# Patient Record
Sex: Male | Born: 1950 | Race: White | Hispanic: No | Marital: Married | Smoking: Former smoker
Health system: Southern US, Community
[De-identification: ages and names within clinical notes are randomized; demographics above are authoritative.]

## PROBLEM LIST (undated history)

## (undated) DIAGNOSIS — E291 Testicular hypofunction: Secondary | ICD-10-CM

## (undated) DIAGNOSIS — I2510 Atherosclerotic heart disease of native coronary artery without angina pectoris: Secondary | ICD-10-CM

## (undated) DIAGNOSIS — G4733 Obstructive sleep apnea (adult) (pediatric): Secondary | ICD-10-CM

## (undated) DIAGNOSIS — F17210 Nicotine dependence, cigarettes, uncomplicated: Secondary | ICD-10-CM

## (undated) DIAGNOSIS — H60549 Acute eczematoid otitis externa, unspecified ear: Secondary | ICD-10-CM

## (undated) DIAGNOSIS — R079 Chest pain, unspecified: Secondary | ICD-10-CM

## (undated) DIAGNOSIS — R9431 Abnormal electrocardiogram [ECG] [EKG]: Secondary | ICD-10-CM

## (undated) DIAGNOSIS — I10 Essential (primary) hypertension: Secondary | ICD-10-CM

## (undated) DIAGNOSIS — G8929 Other chronic pain: Secondary | ICD-10-CM

## (undated) DIAGNOSIS — N400 Enlarged prostate without lower urinary tract symptoms: Secondary | ICD-10-CM

## (undated) DIAGNOSIS — Z9889 Other specified postprocedural states: Secondary | ICD-10-CM

## (undated) DIAGNOSIS — E119 Type 2 diabetes mellitus without complications: Secondary | ICD-10-CM

## (undated) DIAGNOSIS — E669 Obesity, unspecified: Secondary | ICD-10-CM

## (undated) DIAGNOSIS — Z122 Encounter for screening for malignant neoplasm of respiratory organs: Secondary | ICD-10-CM

## (undated) DIAGNOSIS — E785 Hyperlipidemia, unspecified: Secondary | ICD-10-CM

## (undated) DIAGNOSIS — C801 Malignant (primary) neoplasm, unspecified: Secondary | ICD-10-CM

## (undated) DIAGNOSIS — J449 Chronic obstructive pulmonary disease, unspecified: Secondary | ICD-10-CM

## (undated) DIAGNOSIS — M25512 Pain in left shoulder: Secondary | ICD-10-CM

## (undated) DIAGNOSIS — M51369 Other intervertebral disc degeneration, lumbar region without mention of lumbar back pain or lower extremity pain: Secondary | ICD-10-CM

## (undated) DIAGNOSIS — M5441 Lumbago with sciatica, right side: Secondary | ICD-10-CM

## (undated) DIAGNOSIS — M5414 Radiculopathy, thoracic region: Secondary | ICD-10-CM

## (undated) DIAGNOSIS — M5136 Other intervertebral disc degeneration, lumbar region: Secondary | ICD-10-CM

## (undated) DIAGNOSIS — M5417 Radiculopathy, lumbosacral region: Secondary | ICD-10-CM

## (undated) DIAGNOSIS — K219 Gastro-esophageal reflux disease without esophagitis: Secondary | ICD-10-CM

## (undated) DIAGNOSIS — Z6830 Body mass index (BMI) 30.0-30.9, adult: Secondary | ICD-10-CM

## (undated) DIAGNOSIS — L719 Rosacea, unspecified: Secondary | ICD-10-CM

## (undated) HISTORY — PX: SHOULDER ARTHROSCOPY: SHX128

## (undated) HISTORY — DX: Atherosclerotic heart disease of native coronary artery without angina pectoris: I25.10

## (undated) HISTORY — DX: Other intervertebral disc degeneration, lumbar region without mention of lumbar back pain or lower extremity pain: M51.369

## (undated) HISTORY — DX: Nicotine dependence, cigarettes, uncomplicated: F17.210

## (undated) HISTORY — DX: Hyperlipidemia, unspecified: E78.5

## (undated) HISTORY — PX: PROSTATE CRYOABLATION: SHX1032

## (undated) HISTORY — DX: Lumbago with sciatica, right side: M54.41

## (undated) HISTORY — DX: Radiculopathy, thoracic region: M54.14

## (undated) HISTORY — DX: Other intervertebral disc degeneration, lumbar region: M51.36

## (undated) HISTORY — DX: Pain in left shoulder: M25.512

## (undated) HISTORY — DX: Rosacea, unspecified: L71.9

## (undated) HISTORY — PX: CARDIAC CATHETERIZATION: SHX172

## (undated) HISTORY — PX: ARTHROSCOPIC REPAIR ACL: SUR80

## (undated) HISTORY — DX: Other chronic pain: G89.29

## (undated) HISTORY — DX: Body mass index (BMI) 30.0-30.9, adult: Z68.30

## (undated) HISTORY — DX: Type 2 diabetes mellitus without complications: E11.9

## (undated) HISTORY — DX: Radiculopathy, thoracic region: M54.17

## (undated) HISTORY — DX: Essential (primary) hypertension: I10

## (undated) HISTORY — DX: Obesity, unspecified: E66.9

## (undated) HISTORY — DX: Other specified postprocedural states: Z98.890

## (undated) HISTORY — PX: SPINE SURGERY: SHX786

---
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
## 2010-04-23 DIAGNOSIS — E291 Testicular hypofunction: Secondary | ICD-10-CM

## 2010-04-23 HISTORY — DX: Testicular hypofunction: E29.1

## 2010-08-31 DIAGNOSIS — I10 Essential (primary) hypertension: Secondary | ICD-10-CM | POA: Insufficient documentation

## 2010-08-31 HISTORY — DX: Essential (primary) hypertension: I10

## 2012-06-30 DIAGNOSIS — H60549 Acute eczematoid otitis externa, unspecified ear: Secondary | ICD-10-CM | POA: Insufficient documentation

## 2012-06-30 HISTORY — DX: Acute eczematoid otitis externa, unspecified ear: H60.549

## 2012-12-07 DIAGNOSIS — M545 Low back pain, unspecified: Secondary | ICD-10-CM | POA: Insufficient documentation

## 2013-10-07 DIAGNOSIS — G8929 Other chronic pain: Secondary | ICD-10-CM | POA: Insufficient documentation

## 2013-10-07 DIAGNOSIS — Z9989 Dependence on other enabling machines and devices: Secondary | ICD-10-CM

## 2013-10-07 DIAGNOSIS — G4733 Obstructive sleep apnea (adult) (pediatric): Secondary | ICD-10-CM | POA: Insufficient documentation

## 2013-10-07 HISTORY — DX: Other chronic pain: G89.29

## 2013-10-07 HISTORY — DX: Obstructive sleep apnea (adult) (pediatric): G47.33

## 2014-01-07 DIAGNOSIS — J449 Chronic obstructive pulmonary disease, unspecified: Secondary | ICD-10-CM | POA: Insufficient documentation

## 2014-01-07 HISTORY — DX: Chronic obstructive pulmonary disease, unspecified: J44.9

## 2014-07-12 DIAGNOSIS — E1142 Type 2 diabetes mellitus with diabetic polyneuropathy: Secondary | ICD-10-CM

## 2014-07-12 DIAGNOSIS — E119 Type 2 diabetes mellitus without complications: Secondary | ICD-10-CM | POA: Insufficient documentation

## 2014-07-12 HISTORY — DX: Type 2 diabetes mellitus without complications: E11.9

## 2014-07-12 HISTORY — DX: Type 2 diabetes mellitus with diabetic polyneuropathy: E11.42

## 2015-01-10 DIAGNOSIS — K219 Gastro-esophageal reflux disease without esophagitis: Secondary | ICD-10-CM

## 2015-01-10 DIAGNOSIS — M150 Primary generalized (osteo)arthritis: Secondary | ICD-10-CM

## 2015-01-10 DIAGNOSIS — M159 Polyosteoarthritis, unspecified: Secondary | ICD-10-CM

## 2015-01-10 DIAGNOSIS — M8949 Other hypertrophic osteoarthropathy, multiple sites: Secondary | ICD-10-CM

## 2015-01-10 HISTORY — DX: Gastro-esophageal reflux disease without esophagitis: K21.9

## 2015-01-10 HISTORY — DX: Other hypertrophic osteoarthropathy, multiple sites: M89.49

## 2015-01-10 HISTORY — DX: Primary generalized (osteo)arthritis: M15.0

## 2015-01-10 HISTORY — DX: Polyosteoarthritis, unspecified: M15.9

## 2015-07-19 LAB — COLOGUARD: Cologuard: NEGATIVE

## 2017-04-21 LAB — MICROALBUMIN, URINE: Microalb, Ur: NEGATIVE

## 2017-04-21 LAB — LIPID PANEL: LDL CALC: 68

## 2017-10-03 ENCOUNTER — Other Ambulatory Visit: Payer: Self-pay

## 2017-10-03 ENCOUNTER — Ambulatory Visit: Payer: Self-pay | Admitting: Family Medicine

## 2017-10-03 ENCOUNTER — Encounter: Payer: Self-pay | Admitting: Family Medicine

## 2017-10-03 VITALS — BP 138/86 | HR 68 | Temp 98.3°F | Ht 72.25 in | Wt 240.8 lb

## 2017-10-03 DIAGNOSIS — L719 Rosacea, unspecified: Secondary | ICD-10-CM | POA: Insufficient documentation

## 2017-10-03 DIAGNOSIS — E66811 Obesity, class 1: Secondary | ICD-10-CM

## 2017-10-03 DIAGNOSIS — E291 Testicular hypofunction: Secondary | ICD-10-CM

## 2017-10-03 DIAGNOSIS — F17210 Nicotine dependence, cigarettes, uncomplicated: Secondary | ICD-10-CM | POA: Insufficient documentation

## 2017-10-03 DIAGNOSIS — N400 Enlarged prostate without lower urinary tract symptoms: Secondary | ICD-10-CM | POA: Insufficient documentation

## 2017-10-03 DIAGNOSIS — F17219 Nicotine dependence, cigarettes, with unspecified nicotine-induced disorders: Secondary | ICD-10-CM

## 2017-10-03 DIAGNOSIS — F17209 Nicotine dependence, unspecified, with unspecified nicotine-induced disorders: Secondary | ICD-10-CM | POA: Insufficient documentation

## 2017-10-03 DIAGNOSIS — I10 Essential (primary) hypertension: Secondary | ICD-10-CM

## 2017-10-03 DIAGNOSIS — E119 Type 2 diabetes mellitus without complications: Secondary | ICD-10-CM

## 2017-10-03 DIAGNOSIS — E669 Obesity, unspecified: Secondary | ICD-10-CM | POA: Insufficient documentation

## 2017-10-03 DIAGNOSIS — G4733 Obstructive sleep apnea (adult) (pediatric): Secondary | ICD-10-CM

## 2017-10-03 DIAGNOSIS — Z9989 Dependence on other enabling machines and devices: Secondary | ICD-10-CM

## 2017-10-03 HISTORY — DX: Benign prostatic hyperplasia without lower urinary tract symptoms: N40.0

## 2017-10-03 HISTORY — DX: Obesity, unspecified: E66.9

## 2017-10-03 HISTORY — DX: Obesity, class 1: E66.811

## 2017-10-03 HISTORY — DX: Nicotine dependence, cigarettes, uncomplicated: F17.210

## 2017-10-03 LAB — POCT GLYCOSYLATED HEMOGLOBIN (HGB A1C): HEMOGLOBIN A1C: 6.6

## 2017-10-03 MED ORDER — ALBUTEROL SULFATE HFA 108 (90 BASE) MCG/ACT IN AERS: INHALATION_SPRAY | RESPIRATORY_TRACT | 3 refills | Status: DC

## 2017-10-03 MED ORDER — IPRATROPIUM BROMIDE HFA 17 MCG/ACT IN AERS: 2.0000 | INHALATION_SPRAY | Freq: Two times a day (BID) | RESPIRATORY_TRACT | 11 refills | Status: DC

## 2017-10-03 NOTE — Patient Instructions (Addendum)
It was so good seeing you again! Thank you for establishing with my new practice and allowing me to continue caring for you. It means a lot to me.   Please schedule a follow up appointment with me in 6 months for Physical & Diabetes.  Please come fasting.   Please schedule your eye exam and have them send the results to our office.   Steps to Quit Smoking Smoking tobacco can be bad for your health. It can also affect almost every organ in your body. Smoking puts you and people around you at risk for many serious long-lasting (chronic) diseases. Quitting smoking is hard, but it is one of the best things that you can do for your health. It is never too late to quit. What are the benefits of quitting smoking? When you quit smoking, you lower your risk for getting serious diseases and conditions. They can include:  Lung cancer or lung disease.  Heart disease.  Stroke.  Heart attack.  Not being able to have children (infertility).  Weak bones (osteoporosis) and broken bones (fractures).  If you have coughing, wheezing, and shortness of breath, those symptoms may get better when you quit. You may also get sick less often. If you are pregnant, quitting smoking can help to lower your chances of having a baby of low birth weight. What can I do to help me quit smoking? Talk with your doctor about what can help you quit smoking. Some things you can do (strategies) include:  Quitting smoking totally, instead of slowly cutting back how much you smoke over a period of time.  Going to in-person counseling. You are more likely to quit if you go to many counseling sessions.  Using resources and support systems, such as: ? Database administrator with a Social worker. ? Phone quitlines. ? Careers information officer. ? Support groups or group counseling. ? Text messaging programs. ? Mobile phone apps or applications.  Taking medicines. Some of these medicines may have nicotine in them. If you are pregnant or  breastfeeding, do not take any medicines to quit smoking unless your doctor says it is okay. Talk with your doctor about counseling or other things that can help you.  Talk with your doctor about using more than one strategy at the same time, such as taking medicines while you are also going to in-person counseling. This can help make quitting easier. What things can I do to make it easier to quit? Quitting smoking might feel very hard at first, but there is a lot that you can do to make it easier. Take these steps:  Talk to your family and friends. Ask them to support and encourage you.  Call phone quitlines, reach out to support groups, or work with a Social worker.  Ask people who smoke to not smoke around you.  Avoid places that make you want (trigger) to smoke, such as: ? Bars. ? Parties. ? Smoke-break areas at work.  Spend time with people who do not smoke.  Lower the stress in your life. Stress can make you want to smoke. Try these things to help your stress: ? Getting regular exercise. ? Deep-breathing exercises. ? Yoga. ? Meditating. ? Doing a body scan. To do this, close your eyes, focus on one area of your body at a time from head to toe, and notice which parts of your body are tense. Try to relax the muscles in those areas.  Download or buy apps on your mobile phone or tablet that can  help you stick to your quit plan. There are many free apps, such as QuitGuide from the [employer] Office manager for Disease Control and Prevention). You can find more support from smokefree.gov and other websites.  This information is not intended to replace advice given to you by your health care provider. Make sure you discuss any questions you have with your health care provider. Document Released: 03/16/2009 Document Revised: 01/16/2016 Document Reviewed: 10/04/2014 Elsevier Interactive Patient Education  2018 [employer].

## 2017-10-03 NOTE — Progress Notes (Signed)
Subjective  CC:  Chief Complaint  Patient presents with  . Establish Care    Transfer from [place], doing well today, no complaints    HPI: Jason Snyder is a 67 y.o. male is a former [facility] patient and is here to reestablish care with me today. I have reviewed his last 2 OV since seeing me. Had cpe labs 04/2017.    He has the following concerns or needs:  Doing well! DM has been well controlled on same meds. He has no insurance; we choose generic medications accordingly. No foot pain. Overdue for diabetic eye exam. No lows.   HTN: elevated at last 2 OV; hasn't checked outside office. No cp or sob. Weight is up aobut 5 pounds from a year ago. On meds. Good compliance.   Lipids are at goal on statin.   Testosterone supplementation with at goal levels in November  Smoker: tried to quit with patches last year but only made it about 2-3 days: hand/mouth habit, nicotine addiction and stress reduction are triggers. Wants to try again; has trip to [place] planned and thinks the change will help him be successful.   Copd: feels breathing is fine. Hadn't needed albuterol all winter. Needs pfts. Cost has been prohibitive.   BPH: improved on flomax.   Tylenol # 3 - stable use for 6-7 years. Negative UDS for other substances in February.   We updated and reviewed the patient's past history in detail and it is documented below.  Patient Active Problem List   Diagnosis Date Noted  . Obesity (BMI 30.0-34.9) 10/03/2017  . Nicotine dependence with nicotine-induced disorder 10/03/2017  . Rosacea 10/03/2017  . Smoking greater than 30 pack years 10/03/2017  . BPH without obstruction/lower urinary tract symptoms 10/03/2017  . Gastroesophageal reflux disease 01/10/2015  . Primary osteoarthritis involving multiple joints 01/10/2015  . Controlled type 2 diabetes mellitus without complication, without long-term current use of insulin ([place]) 07/12/2014  . COPD (chronic obstructive pulmonary disease)  ([place]) 01/07/2014  . Chronic pain 10/07/2013  . OSA on CPAP 10/07/2013  . Low back pain 12/07/2012  . 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 Essential hypertension 08/31/2010    Elevated ASCVD risk - started lipitor 04/2013   . 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 HEMOGLOBIN A1C  01/26/1951  . Hepatitis C Screening  January 16, 1951  . OPHTHALMOLOGY EXAM  04/11/1963  . HIV Screening  04/10/1968  . INFLUENZA VACCINE  01/01/2018  . Fecal DNA (Cologuard)  07/18/2018  . FOOT EXAM  10/04/2018  . TETANUS/TDAP  01/09/2025   Immunization History  Administered Date(s) Administered  . Hepatitis B, ped/adol 06/04/2003  . Influenza Split 03/03/2010  . Influenza, High Dose Seasonal PF 03/21/2017  . Influenza, Seasonal, Injecte, Preservative Fre 03/23/2014, 04/12/2015  . Influenza,inj,Quad PF,6+ Mos 04/01/2016  . Pneumococcal Polysaccharide-23 06/04/2003  . Td 06/04/2003  . Tdap 01/10/2015  . Zoster 09/18/2015   Current Meds  Medication Sig  . acetaminophen-codeine (TYLENOL #3) 300-30 MG tablet Take by mouth.  Marland Kitchen albuterol (VENTOLIN HFA) 108 (90 Base) MCG/ACT inhaler INHALE 2 PUFFS BY MOUTH EVERY 4 HOURS AS NEEDED FOR WHEEZING  . amLODipine (NORVASC) 10 MG tablet TAKE ONE TABLET BY MOUTH ONE TIME DAILY  . aspirin 81 MG EC tablet Take by mouth.  Marland Kitchen atorvastatin (LIPITOR) 10 MG tablet Take by mouth.  . diclofenac sodium (VOLTAREN) 1 % GEL PLACE 2  GRAMS ON TO THE SKIN 4 TIMES A DAY AS DIRECTED  . fluticasone (FLONASE) 50 MCG/ACT nasal spray Place into the nose.  Marland Kitchen glipiZIDE (GLUCOTROL XL) 10 MG 24 hr tablet Take by mouth.  . hydrochlorothiazide (HYDRODIURIL) 25 MG tablet Take by mouth.  . Loratadine (CLARITIN) 10 MG CAPS Take by mouth.  . losartan (COZAAR) 100 MG tablet Take by mouth.  . metoprolol tartrate (LOPRESSOR) 50 MG tablet TAKE 1 TABLET BY MOUTH TWICE A DAY  . Multiple Vitamin  (MULTIVITAMIN) capsule Take by mouth.  . neomycin-polymyxin-hydrocortisone (CORTISPORIN) OTIC solution PLACE 3 DROPS IN AFFECTED EAR(S) 3 TIMES A DAY AS NEEDED.  Marland Kitchen tamsulosin (FLOMAX) 0.4 MG CAPS capsule Take by mouth.  . testosterone cypionate (DEPOTESTOSTERONE CYPIONATE) 200 MG/ML injection inject 1 ml into the muscle every 14 days  . [DISCONTINUED] albuterol (VENTOLIN HFA) 108 (90 Base) MCG/ACT inhaler INHALE 2 PUFFS BY MOUTH EVERY 4 HOURS AS NEEDED FOR WHEEZING    Allergies: Patient is allergic to rubus fruticosus. Past Medical History Patient  has a past medical history of BMI 30.0-30.9,adult, Chronic left shoulder pain (c), Chronic low back pain with right-sided sciatica, DDD (degenerative disc disease), lumbar, Diabetes mellitus without complication ([place]), History of lumbar surgery, Hyperlipidemia, Hypertension, Nicotine dependence, cigarettes, uncomplicated, Obesity (BMI 30-39.9), Rosacea, and Thoracic and lumbosacral neuritis. Past Surgical History Patient  has a past surgical history that includes Shoulder arthroscopy; Spine surgery; and Arthroscopic repair ACL. Family History: Patient family history includes Heart disease in his father and maternal uncle; Lung cancer in his father and sister; Pulmonary embolism in his mother. Social History:  Patient  reports that he has been smoking cigarettes.  He has a 15.00 pack-year smoking history. He uses smokeless tobacco. He reports that he drank alcohol. He reports that he has current or past drug history.  Review of Systems: Constitutional: negative for fever or malaise Ophthalmic: negative for photophobia, double vision or loss of vision Cardiovascular: negative for chest pain, dyspnea on exertion, or new LE swelling Respiratory: negative for SOB or persistent cough Gastrointestinal: negative for abdominal pain, change in bowel habits or melena Genitourinary: negative for dysuria or gross hematuria Musculoskeletal: negative for new  gait disturbance or muscular weakness Integumentary: negative for new or persistent rashes Neurological: negative for TIA or stroke symptoms Psychiatric: negative for SI or delusions Allergic/Immunologic: negative for hives  Patient Care Team    Relationship Specialty Notifications Start End  [name], MD PCP - General Family Medicine  10/03/17    .  Objective  Vitals: BP 138/86   Pulse 68   Temp 98.3 F (36.8 C)   Ht 6' 0.25" (1.835 m)   Wt 240 lb 12.8 oz (109.2 kg)   BMI 32.43 kg/m  General:  Well developed, well nourished, no acute distress  Psych:  Alert and oriented,normal mood and affect HEENT:  Normocephalic, atraumatic, non-icteric sclera, PERRL, oropharynx is without mass or exudate, supple neck without adenopathy, mass or thyromegaly Cardiovascular:  RRR without gallop, rub or murmur, nondisplaced PMI Respiratory:  Good breath sounds bilaterally, diffuse mild exp wheeze, no rhonchi or rales Diabetic Foot Exam: Appearance - no lesions, ulcers or calluses Skin - no sigificant pallor or erythema Monofilament testing - sensitive bilaterally in following locations:  Right - Great toe, medial, central, lateral ball and posterior foot intact  Left - Great toe, medial, central, lateral ball and posterior foot intact Pulses - +2 distally bilaterally   Assessment  1. Controlled type 2 diabetes mellitus without complication,  without long-term current use of insulin ([place])   2. Essential hypertension   3. Hypogonadism male   4. OSA on CPAP   5. Cigarette nicotine dependence with nicotine-induced disorder   6. BPH without obstruction/lower urinary tract symptoms      Plan   DM - good control. rec eye exam. Neg urine MAC. Will push for better bp control. Continue same meds.   HTN: above goal marginally; work on weight loss; continue same meds. Recheck 6 months. He will monitor at home and send me note if remains > 135/85 consistently. Renal and lytes are stable.    Lipids at goal  Discussed tobacco cessation: rec patches, stress reduction strategies.   Copd- see if he can afford atrovent; will work up copd once on medicare: nov 2019  bph - improved. Nl psa.  Follow up:  Return in about 6 months (around 04/05/2018) for complete physical.  Commons side effects, risks, benefits, and alternatives for medications and treatment plan prescribed today were discussed, and the patient expressed understanding of the given instructions. Patient is instructed to call or message via MyChart if he/she has any questions or concerns regarding our treatment plan. No barriers to understanding were identified. We discussed Red Flag symptoms and signs in detail. Patient expressed understanding regarding what to do in case of urgent or emergency type symptoms.   Medication list was reconciled, printed and provided to the patient in AVS. Patient instructions and summary information was reviewed with the patient as documented in the AVS. This note was prepared with assistance of Dragon voice recognition software. Occasional wrong-word or sound-a-like substitutions may have occurred due to the inherent limitations of voice recognition software  Orders Placed This Encounter  Procedures  . Cologuard  . Lipid panel  . Microalbumin, urine  . POCT glycosylated hemoglobin (Hb A1C)   Meds ordered this encounter  Medications  . albuterol (VENTOLIN HFA) 108 (90 Base) MCG/ACT inhaler    Sig: INHALE 2 PUFFS BY MOUTH EVERY 4 HOURS AS NEEDED FOR WHEEZING    Dispense:  1 each    Refill:  3  . ipratropium (ATROVENT HFA) 17 MCG/ACT inhaler    Sig: Inhale 2 puffs into the lungs 2 (two) times daily.    Dispense:  1 Inhaler    Refill:  11

## 2017-11-03 ENCOUNTER — Encounter: Payer: Self-pay | Admitting: Family Medicine

## 2017-11-03 ENCOUNTER — Ambulatory Visit: Payer: Self-pay | Admitting: Family Medicine

## 2017-11-03 ENCOUNTER — Other Ambulatory Visit: Payer: Self-pay

## 2017-11-03 VITALS — BP 146/82 | HR 58 | Temp 97.9°F | Ht 72.25 in | Wt 241.2 lb

## 2017-11-03 DIAGNOSIS — M7582 Other shoulder lesions, left shoulder: Secondary | ICD-10-CM

## 2017-11-03 DIAGNOSIS — G8929 Other chronic pain: Secondary | ICD-10-CM

## 2017-11-03 DIAGNOSIS — M150 Primary generalized (osteo)arthritis: Secondary | ICD-10-CM

## 2017-11-03 DIAGNOSIS — G894 Chronic pain syndrome: Secondary | ICD-10-CM

## 2017-11-03 DIAGNOSIS — M545 Low back pain: Secondary | ICD-10-CM

## 2017-11-03 DIAGNOSIS — M159 Polyosteoarthritis, unspecified: Secondary | ICD-10-CM

## 2017-11-03 MED ORDER — TAMSULOSIN HCL 0.4 MG PO CAPS: 0.4000 mg | ORAL_CAPSULE | Freq: Every day | ORAL | 3 refills | Status: DC

## 2017-11-03 MED ORDER — ACETAMINOPHEN-CODEINE #3 300-30 MG PO TABS: 1.0000 | ORAL_TABLET | Freq: Four times a day (QID) | ORAL | 0 refills | Status: DC | PRN

## 2017-11-03 MED ORDER — TRIAMCINOLONE ACETONIDE 40 MG/ML IJ SUSP
40.0000 mg | Freq: Once | INTRAMUSCULAR | Status: AC
Start: 2017-11-03 — End: 2017-11-03
  Administered 2017-11-03: 40 mg via INTRA_ARTICULAR

## 2017-11-03 NOTE — Patient Instructions (Addendum)
Follow-up as scheduled in November.     Steps to Quit Smoking Smoking tobacco can be bad for your health. It can also affect almost every organ in your body. Smoking puts you and people around you at risk for many serious long-lasting (chronic) diseases. Quitting smoking is hard, but it is one of the best things that you can do for your health. It is never too late to quit. What are the benefits of quitting smoking? When you quit smoking, you lower your risk for getting serious diseases and conditions. They can include:  Lung cancer or lung disease.  Heart disease.  Stroke.  Heart attack.  Not being able to have children (infertility).  Weak bones (osteoporosis) and broken bones (fractures).  If you have coughing, wheezing, and shortness of breath, those symptoms may get better when you quit. You may also get sick less often. If you are pregnant, quitting smoking can help to lower your chances of having a baby of low birth weight. What can I do to help me quit smoking? Talk with your doctor about what can help you quit smoking. Some things you can do (strategies) include:  Quitting smoking totally, instead of slowly cutting back how much you smoke over a period of time.  Going to in-person counseling. You are more likely to quit if you go to many counseling sessions.  Using resources and support systems, such as: ? Database administrator with a Social worker. ? Phone quitlines. ? Careers information officer. ? Support groups or group counseling. ? Text messaging programs. ? Mobile phone apps or applications.  Taking medicines. Some of these medicines may have nicotine in them. If you are pregnant or breastfeeding, do not take any medicines to quit smoking unless your doctor says it is okay. Talk with your doctor about counseling or other things that can help you.  Talk with your doctor about using more than one strategy at the same time, such as taking medicines while you are also going to  in-person counseling. This can help make quitting easier. What things can I do to make it easier to quit? Quitting smoking might feel very hard at first, but there is a lot that you can do to make it easier. Take these steps:  Talk to your family and friends. Ask them to support and encourage you.  Call phone quitlines, reach out to support groups, or work with a Social worker.  Ask people who smoke to not smoke around you.  Avoid places that make you want (trigger) to smoke, such as: ? Bars. ? Parties. ? Smoke-break areas at work.  Spend time with people who do not smoke.  Lower the stress in your life. Stress can make you want to smoke. Try these things to help your stress: ? Getting regular exercise. ? Deep-breathing exercises. ? Yoga. ? Meditating. ? Doing a body scan. To do this, close your eyes, focus on one area of your body at a time from head to toe, and notice which parts of your body are tense. Try to relax the muscles in those areas.  Download or buy apps on your mobile phone or tablet that can help you stick to your quit plan. There are many free apps, such as QuitGuide from the [employer] Office manager for Disease Control and Prevention). You can find more support from smokefree.gov and other websites.  This information is not intended to replace advice given to you by your health care provider. Make sure you discuss any questions you have  with your health care provider. Document Released: 03/16/2009 Document Revised: 01/16/2016 Document Reviewed: 10/04/2014 Elsevier Interactive Patient Education  2018 [employer].

## 2017-11-03 NOTE — Progress Notes (Signed)
Subjective  CC:  Chief Complaint  Patient presents with  . Shoulder Pain    x 2 weeks, has had cortisone injection in the past   . Medication Refill    Needs refills on Tylenol 3 and Flomax    HPI: Jason Snyder is a 67 y.o. male who presents to the office today to address the problems listed above in the chief complaint.  Has had surgery on left shoulder x 2, years ago. 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 Has periodic pain in shoulder but it will typically improve with otc meds and a little rest; however, for two week now he has had worsening shoulder pain.  It hurts when he lies on that side at night, he is unable to raise his arm overhead without significant pain.  Over-the-counter medications without relief.  He denies injury or overuse.  No neck pain or radicular symptoms.  No weakness in that arm.  About 5 years ago, he had similar symptoms and it was relieved with a steroid injection.  He has used NSAIDs without relief.  Chronic pain syndrome with low back pain and osteoarthritis and chronic Tylenol 3.  He has been on this for the last decade or so.  Uses 1 to 2 tablets daily.  I reviewed narcotic database.  He uses medications as prescribed.  Urine drug screen was negative in February of this year documented by new garden [employer].  He is due a refill of his pain medications.  BPH responsive to Flomax in need of a refill. Assessment  1. Rotator cuff tendonitis, left   2. Chronic pain syndrome   3. Chronic midline low back pain without sciatica   4. Primary osteoarthritis involving multiple joints      Plan   Rotator cuff tendinitis, left, secondary to osteoarthritis: Status post steroid injection.  Recommend icing range of motion exercises and time.  Follow-up if unimproved.  Chronic pain syndrome with chronic narcotic use.  Pain contract signed reviewed today.  Urine drug screen up-to-date.  Meds refilled  Follow up: Return for as scheduled, complete physical.   Orders Placed  This Encounter  Procedures  . Toxicology screen, urine   Meds ordered this encounter  Medications  . acetaminophen-codeine (TYLENOL #3) 300-30 MG tablet    Sig: Take 1 tablet by mouth every 6 (six) hours as needed for moderate pain.    Dispense:  90 tablet    Refill:  0  . tamsulosin (FLOMAX) 0.4 MG CAPS capsule    Sig: Take 1 capsule (0.4 mg total) by mouth daily.    Dispense:  90 capsule    Refill:  3  . triamcinolone acetonide (KENALOG-40) injection 40 mg      I reviewed the patients updated PMH, FH, and SocHx.    Patient Active Problem List   Diagnosis Date Noted  . Obesity (BMI 30.0-34.9) 10/03/2017  . Nicotine dependence with nicotine-induced disorder 10/03/2017  . Rosacea 10/03/2017  . Smoking greater than 30 pack years 10/03/2017  . BPH without obstruction/lower urinary tract symptoms 10/03/2017  . Gastroesophageal reflux disease 01/10/2015  . Primary osteoarthritis involving multiple joints 01/10/2015  . Controlled type 2 diabetes mellitus without complication, without long-term current use of insulin ([place]) 07/12/2014  . COPD (chronic obstructive pulmonary disease) ([place]) 01/07/2014  . Chronic pain 10/07/2013  . OSA on CPAP 10/07/2013  . Low back pain 12/07/2012  . 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 Essential hypertension 08/31/2010  . 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   Current  Meds  Medication Sig  . acetaminophen-codeine (TYLENOL #3) 300-30 MG tablet Take 1 tablet by mouth every 6 (six) hours as needed for moderate pain.  Marland Kitchen albuterol (VENTOLIN HFA) 108 (90 Base) MCG/ACT inhaler INHALE 2 PUFFS BY MOUTH EVERY 4 HOURS AS NEEDED FOR WHEEZING  . amLODipine (NORVASC) 10 MG tablet TAKE ONE TABLET BY MOUTH ONE TIME DAILY  . aspirin 81 MG EC tablet Take by mouth.  Marland Kitchen atorvastatin (LIPITOR) 10 MG tablet Take by mouth.  . diclofenac sodium (VOLTAREN) 1 % GEL PLACE 2 GRAMS ON TO THE SKIN 4 TIMES A DAY AS DIRECTED  . fluticasone (FLONASE) 50 MCG/ACT nasal spray Place into the  nose.  Marland Kitchen glipiZIDE (GLUCOTROL XL) 10 MG 24 hr tablet Take by mouth.  . hydrochlorothiazide (HYDRODIURIL) 25 MG tablet Take by mouth.  Marland Kitchen ipratropium (ATROVENT HFA) 17 MCG/ACT inhaler Inhale 2 puffs into the lungs 2 (two) times daily.  . Loratadine (CLARITIN) 10 MG CAPS Take by mouth.  . losartan (COZAAR) 100 MG tablet Take by mouth.  . metoprolol tartrate (LOPRESSOR) 50 MG tablet TAKE 1 TABLET BY MOUTH TWICE A DAY  . Multiple Vitamin (MULTIVITAMIN) capsule Take by mouth.  . neomycin-polymyxin-hydrocortisone (CORTISPORIN) OTIC solution PLACE 3 DROPS IN AFFECTED EAR(S) 3 TIMES A DAY AS NEEDED.  Marland Kitchen tamsulosin (FLOMAX) 0.4 MG CAPS capsule Take 1 capsule (0.4 mg total) by mouth daily.  Marland Kitchen testosterone cypionate (DEPOTESTOSTERONE CYPIONATE) 200 MG/ML injection inject 1 ml into the muscle every 14 days  . [DISCONTINUED] acetaminophen-codeine (TYLENOL #3) 300-30 MG tablet Take by mouth.  . [DISCONTINUED] tamsulosin (FLOMAX) 0.4 MG CAPS capsule Take by mouth.    Allergies: Patient is allergic to rubus fruticosus. Family History: Patient family history includes Heart disease in his father and maternal uncle; Lung cancer in his father and sister; Pulmonary embolism in his mother. Social History:  Patient  reports that he has been smoking cigarettes.  He has a 15.00 pack-year smoking history. He uses smokeless tobacco. He reports that he drank alcohol. He reports that he has current or past drug history.  Review of Systems: Constitutional: Negative for fever malaise or anorexia Cardiovascular: negative for chest pain Respiratory: negative for SOB or persistent cough Gastrointestinal: negative for abdominal pain  Objective  Vitals: BP (!) 146/82   Pulse (!) 58   Temp 97.9 F (36.6 C)   Ht 6' 0.25" (1.835 m)   Wt 241 lb 3.2 oz (109.4 kg)   BMI 32.49 kg/m  General: no acute distress , A&Ox3 Left shoulder: Full range of motion with pain starting at 90 degrees abduction.  Mild subacromial  tenderness.  Negative empty can testing.  Negative drop test.  Mild impingement syndrome.  Positive crepitus Cardiovascular:  RRR without murmur or gallop.  Respiratory:  Good breath sounds bilaterally, CTAB with normal respiratory effort Skin:  Warm, no rashes  Shoulder Injection Procedure Note  Pre-operative Diagnosis: left shoulder RCT  Post-operative Diagnosis: same  Indications: Symptomatic relief and failed conservative measures  Anesthesia: Lidocaine 1% without epinephrine without added sodium bicarbonate  Procedure Details   Verbal consent was obtained for the procedure. The shoulder was prepped with alcohol  and the skin was anesthetized with cold spray. Using a 22 gauge needle the subacromial space was injected with 4 mL 1% lidocaine and 1 mL of triamcinolone (KENALOG) 40mg /ml under the posterior aspect of the acromion. The injection site was cleansed with topical isopropyl alcohol and a dressing was applied.  Complications:  None; patient tolerated the procedure  well.    Commons side effects, risks, benefits, and alternatives for medications and treatment plan prescribed today were discussed, and the patient expressed understanding of the given instructions. Patient is instructed to call or message via MyChart if he/she has any questions or concerns regarding our treatment plan. No barriers to understanding were identified. We discussed Red Flag symptoms and signs in detail. Patient expressed understanding regarding what to do in case of urgent or emergency type symptoms.   Medication list was reconciled, printed and provided to the patient in AVS. Patient instructions and summary information was reviewed with the patient as documented in the AVS. This note was prepared with assistance of Dragon voice recognition software. Occasional wrong-word or sound-a-like substitutions may have occurred due to the inherent limitations of voice recognition software

## 2017-12-15 ENCOUNTER — Telehealth: Payer: Self-pay | Admitting: Family Medicine

## 2017-12-15 MED ORDER — AMLODIPINE BESYLATE 10 MG PO TABS: 10.0000 mg | ORAL_TABLET | Freq: Every day | ORAL | 3 refills | Status: DC

## 2017-12-15 NOTE — Telephone Encounter (Signed)
Copied from [place] [id number]. Topic: Quick Communication - Rx Refill/Question >> Dec 15, 2017 10:42 AM [name] wrote: amLODipine (NORVASC) 10 MG tablet  Needing refills   [facility] # [address] [place] [address] [place] [address] Phone: [phone] Fax: [phone]

## 2017-12-15 NOTE — Telephone Encounter (Signed)
Refill of Norvasc by historical provider  LOV 11/03/17 Dr. Jonni Sanger  [facility] 06/10/17

## 2017-12-15 NOTE — Telephone Encounter (Signed)
Medication refilled to [facility]

## 2017-12-29 ENCOUNTER — Other Ambulatory Visit: Payer: Self-pay | Admitting: Family Medicine

## 2017-12-29 NOTE — Telephone Encounter (Signed)
Refills by historical provider:  Lipitor LRF 2/1/919  Glipizide LRF 07/04/17  Hydrodiuril LRF 04/21/17  Testosterone LRF 05/06/17   Also: Tylenol #3   by Dr. Jonni Sanger LRF 11/03/17  #90 0 refills  LOV 11/03/17      [facility] # [address] [place] - [place]      [phone] (Phone) [phone] (Fax)

## 2017-12-29 NOTE — Telephone Encounter (Signed)
Copied from [place] [id number]. Topic: Quick Communication - Rx Refill/Question >> Dec 29, 2017 10:13 AM Bea Graff, NT wrote: Medication: atorvastatin (LIPITOR) 10 MG tablet, testosterone cypionate (DEPOTESTOSTERONE CYPIONATE) 200 MG/ML injection, acetaminophen-codeine (TYLENOL #3) 300-30 MG tablet, glipiZIDE (GLUCOTROL XL) 10 MG 24 hr tablet and hydrochlorothiazide (HYDRODIURIL) 25 MG tablet  Has the patient contacted their pharmacy? Yes.   (Agent: If no, request that the patient contact the pharmacy for the refill.) (Agent: If yes, when and what did the pharmacy advise?)  Preferred Pharmacy (with phone number or street name): [facility] # [address], [place] [phone] (Phone) [phone] (Fax)      Agent: Please be advised that RX refills may take up to 3 business days. We ask that you follow-up with your pharmacy.

## 2017-12-30 MED ORDER — HYDROCHLOROTHIAZIDE 25 MG PO TABS: 25.0000 mg | ORAL_TABLET | Freq: Every day | ORAL | 3 refills | Status: DC

## 2017-12-30 MED ORDER — TESTOSTERONE CYPIONATE 200 MG/ML IM SOLN: INTRAMUSCULAR | 3 refills | Status: DC

## 2017-12-30 MED ORDER — ACETAMINOPHEN-CODEINE #3 300-30 MG PO TABS: 1.0000 | ORAL_TABLET | Freq: Four times a day (QID) | ORAL | 0 refills | Status: DC | PRN

## 2017-12-30 MED ORDER — GLIPIZIDE ER 10 MG PO TB24: 10.0000 mg | ORAL_TABLET | Freq: Every day | ORAL | 3 refills | Status: DC

## 2017-12-30 MED ORDER — ATORVASTATIN CALCIUM 10 MG PO TABS: 10.0000 mg | ORAL_TABLET | Freq: Every day | ORAL | 3 refills | Status: DC

## 2018-01-26 ENCOUNTER — Other Ambulatory Visit: Payer: Self-pay | Admitting: Family Medicine

## 2018-01-26 MED ORDER — LOSARTAN POTASSIUM 100 MG PO TABS: 100.0000 mg | ORAL_TABLET | Freq: Every day | ORAL | 3 refills | Status: DC

## 2018-01-26 NOTE — Telephone Encounter (Signed)
Copied from [place]. Topic: Quick Communication - Rx Refill/Question >> Jan 26, 2018  9:38 AM Margot Ables wrote: Medication: losartan (COZAAR) 100 MG tablet  - pt has 8 days of medication left - pt states he was with Dr. Jonni Sanger at previous office, no refills left  Has the patient contacted their pharmacy? No refills Preferred Pharmacy (with phone number or street name): [facility] # [address], [place] [phone] (Phone) [phone] (Fax)  Pt states he is due to see eye doctor for diabetic eye exam and is wondering if Dr. Jonni Sanger has a form that he took with him to be completed the last time. If so please mail to him. Address verified. [place]  [place] [address]

## 2018-01-26 NOTE — Telephone Encounter (Signed)
Received and reviewed medication refill request.  Request is appropriate and was approved.  Please see medication orders for details.   Doloris Hall,  LPN

## 2018-02-03 ENCOUNTER — Other Ambulatory Visit: Payer: Self-pay

## 2018-02-03 ENCOUNTER — Ambulatory Visit: Payer: Self-pay | Admitting: Family Medicine

## 2018-02-03 ENCOUNTER — Encounter: Payer: Self-pay | Admitting: Family Medicine

## 2018-02-03 VITALS — BP 128/82 | HR 71 | Temp 97.9°F | Ht 72.25 in | Wt 242.8 lb

## 2018-02-03 DIAGNOSIS — M7582 Other shoulder lesions, left shoulder: Secondary | ICD-10-CM

## 2018-02-03 DIAGNOSIS — M159 Polyosteoarthritis, unspecified: Secondary | ICD-10-CM

## 2018-02-03 DIAGNOSIS — M150 Primary generalized (osteo)arthritis: Secondary | ICD-10-CM

## 2018-02-03 MED ORDER — PREDNISONE 10 MG PO TABS: ORAL_TABLET | ORAL | 0 refills | Status: DC

## 2018-02-03 MED ORDER — CYCLOBENZAPRINE HCL 10 MG PO TABS: 10.0000 mg | ORAL_TABLET | Freq: Three times a day (TID) | ORAL | 0 refills | Status: DC | PRN

## 2018-02-03 MED ORDER — ACETAMINOPHEN-CODEINE #3 300-30 MG PO TABS: 1.0000 | ORAL_TABLET | Freq: Four times a day (QID) | ORAL | 0 refills | Status: DC | PRN

## 2018-02-03 NOTE — Patient Instructions (Signed)
Use ice to the shoulder; can use moist heat to trapezius muscles.  Keep doing range of motion exercise for the shoulder.   If you have any questions or concerns, please don't hesitate to send me a message via MyChart or call the office at [phone]. Thank you for visiting with [place] today! It's our pleasure caring for you.

## 2018-02-03 NOTE — Progress Notes (Signed)
Subjective  CC:  Chief Complaint  Patient presents with  . Shoulder Pain    left shoulder pain x 1 week, worse with movement, wants to wait on flu shot     HPI: Jason Snyder is a 67 y.o. male who presents to the office today to address the problems listed above in the chief complaint.  See last note: s/p steroid injection 6/3 with significant improvement in pain after about 4 days. However, never fully subsided. Last week felt acute worsening of pain and heard a pop while washing his hair. No overuse or strain. No weakness. Has pain in left upper back, shoulder down to elbow.   H/o shoulder surgery x 2 about 15 years ago. + shoulder OA  Pt w/o insurance: would prefer lowest cost tx options.    Assessment  1. Rotator cuff tendonitis, left   2. Primary osteoarthritis involving multiple joints      Plan   Shoulder pain:  Steroid burst and mm relaxers. ROM, ice and heat. If persists, rec ortho and PT eval once medicare starts in November.   Follow up: cpe nov 4th   No orders of the defined types were placed in this encounter.  Meds ordered this encounter  Medications  . predniSONE (DELTASONE) 10 MG tablet    Sig: Take 4 tabs qd x 2 days, 3 qd x 2 days, 2 qd x 2d, 1qd x 3 days    Dispense:  21 tablet    Refill:  0  . cyclobenzaprine (FLEXERIL) 10 MG tablet    Sig: Take 1 tablet (10 mg total) by mouth 3 (three) times daily as needed for muscle spasms.    Dispense:  30 tablet    Refill:  0  . acetaminophen-codeine (TYLENOL #3) 300-30 MG tablet    Sig: Take 1 tablet by mouth every 6 (six) hours as needed for moderate pain.    Dispense:  90 tablet    Refill:  0      I reviewed the patients updated PMH, FH, and SocHx.    Patient Active Problem List   Diagnosis Date Noted  . Obesity (BMI 30.0-34.9) 10/03/2017  . Nicotine dependence with nicotine-induced disorder 10/03/2017  . Rosacea 10/03/2017  . Smoking greater than 30 pack years 10/03/2017  . BPH without  obstruction/lower urinary tract symptoms 10/03/2017  . Gastroesophageal reflux disease 01/10/2015  . Primary osteoarthritis involving multiple joints 01/10/2015  . Controlled type 2 diabetes mellitus without complication, without long-term current use of insulin ([place]) 07/12/2014  . COPD (chronic obstructive pulmonary disease) ([place]) 01/07/2014  . Chronic pain 10/07/2013  . OSA on CPAP 10/07/2013  . Low back pain 12/07/2012  . 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 Essential hypertension 08/31/2010  . 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   Current Meds  Medication Sig  . acetaminophen-codeine (TYLENOL #3) 300-30 MG tablet Take 1 tablet by mouth every 6 (six) hours as needed for moderate pain.  Marland Kitchen albuterol (VENTOLIN HFA) 108 (90 Base) MCG/ACT inhaler INHALE 2 PUFFS BY MOUTH EVERY 4 HOURS AS NEEDED FOR WHEEZING  . amLODipine (NORVASC) 10 MG tablet Take 1 tablet (10 mg total) by mouth daily.  Marland Kitchen aspirin 81 MG EC tablet Take by mouth.  Marland Kitchen atorvastatin (LIPITOR) 10 MG tablet Take 1 tablet (10 mg total) by mouth at bedtime.  . diclofenac sodium (VOLTAREN) 1 % GEL PLACE 2 GRAMS ON TO THE SKIN 4 TIMES A DAY AS DIRECTED  . fluticasone (FLONASE) 50 MCG/ACT nasal spray Place into  the nose.  Marland Kitchen glipiZIDE (GLUCOTROL XL) 10 MG 24 hr tablet Take 1 tablet (10 mg total) by mouth daily with breakfast.  . hydrochlorothiazide (HYDRODIURIL) 25 MG tablet Take 1 tablet (25 mg total) by mouth daily.  Marland Kitchen ipratropium (ATROVENT HFA) 17 MCG/ACT inhaler Inhale 2 puffs into the lungs 2 (two) times daily.  . Loratadine (CLARITIN) 10 MG CAPS Take by mouth.  . losartan (COZAAR) 100 MG tablet Take 1 tablet (100 mg total) by mouth daily.  . metoprolol tartrate (LOPRESSOR) 50 MG tablet TAKE 1 TABLET BY MOUTH TWICE A DAY  . Multiple Vitamin (MULTIVITAMIN) capsule Take by mouth.  . tamsulosin (FLOMAX) 0.4 MG CAPS capsule Take 1 capsule (0.4 mg total) by mouth daily.  Marland Kitchen testosterone cypionate (DEPOTESTOSTERONE CYPIONATE) 200 MG/ML injection  inject 1 ml into the muscle every 14 days  . [DISCONTINUED] acetaminophen-codeine (TYLENOL #3) 300-30 MG tablet Take 1 tablet by mouth every 6 (six) hours as needed for moderate pain.    Allergies: Patient is allergic to rubus fruticosus. Family History: Patient family history includes Heart disease in his father and maternal uncle; Lung cancer in his father and sister; Pulmonary embolism in his mother. Social History:  Patient  reports that he has been smoking cigarettes. He has a 15.00 pack-year smoking history. He uses smokeless tobacco. He reports that he drank alcohol. He reports that he has current or past drug history.  Review of Systems: Constitutional: Negative for fever malaise or anorexia Cardiovascular: negative for chest pain Respiratory: negative for SOB or persistent cough Gastrointestinal: negative for abdominal pain  Objective  Vitals: BP 128/82   Pulse 71   Temp 97.9 F (36.6 C)   Ht 6' 0.25" (1.835 m)   Wt 242 lb 12.8 oz (110.1 kg)   SpO2 96%   BMI 32.70 kg/m  General: no acute distress , A&Ox3 Physical Exam  Neck: Normal range of motion. Neck supple.  Musculoskeletal:       Left shoulder: He exhibits tenderness and pain (with full abduction and empty can testing). He exhibits normal range of motion, no bony tenderness, no swelling, no crepitus, no spasm, normal pulse and normal strength.       Arms:     Commons side effects, risks, benefits, and alternatives for medications and treatment plan prescribed today were discussed, and the patient expressed understanding of the given instructions. Patient is instructed to call or message via MyChart if he/she has any questions or concerns regarding our treatment plan. No barriers to understanding were identified. We discussed Red Flag symptoms and signs in detail. Patient expressed understanding regarding what to do in case of urgent or emergency type symptoms.   Medication list was reconciled, printed and provided  to the patient in AVS. Patient instructions and summary information was reviewed with the patient as documented in the AVS. This note was prepared with assistance of Dragon voice recognition software. Occasional wrong-word or sound-a-like substitutions may have occurred due to the inherent limitations of voice recognition software

## 2018-02-23 ENCOUNTER — Telehealth: Payer: Self-pay | Admitting: Family Medicine

## 2018-02-23 MED ORDER — METOPROLOL TARTRATE 50 MG PO TABS: 50.0000 mg | ORAL_TABLET | Freq: Two times a day (BID) | ORAL | 1 refills | Status: DC

## 2018-02-23 NOTE — Telephone Encounter (Signed)
Copied from [place] [id number]. Topic: Quick Communication - See Telephone Encounter >> Feb 23, 2018 11:06 AM Ivar Drape wrote: CRM for notification. See Telephone encounter for: 02/23/18. Patient would like a refill on his metoprolol tartrate (LOPRESSOR) 50 MG tablet medication and have it sent to his preferred pharmacy [facility] in [place].

## 2018-02-23 NOTE — Telephone Encounter (Signed)
Metoprolol 50mg  refill Last Refilled by historical provider Last OV: 02/03/18 PCP: Dr. Jonni Sanger Pharmacy: [facility] in [place]

## 2018-02-23 NOTE — Telephone Encounter (Signed)
Received and reviewed medication refill request.  Request is appropriate and was approved.  Please see medication orders for details.  

## 2018-02-25 ENCOUNTER — Telehealth: Payer: Self-pay | Admitting: Emergency Medicine

## 2018-02-25 NOTE — Telephone Encounter (Signed)
Spoke with Patient and advised patient to have his Eye Doctor send [place] documentation of his visit. He verbalized understanding.   Doloris Hall,  LPN

## 2018-02-25 NOTE — Telephone Encounter (Signed)
Copied from [place] [id number]. Topic: General - Other >> Feb 25, 2018 12:59 PM Keene Breath wrote: Reason for CRM: Patient called to inform doctor that he is scheduled to see his Optician on 03/04/18 and he believes he needs a form to be filled out for Dr. Jonni Sanger to review.  Please advise if patient needs a form from the office.  Patient's CB# [phone].

## 2018-03-04 LAB — HM DIABETES EYE EXAM

## 2018-03-05 ENCOUNTER — Encounter: Payer: Self-pay | Admitting: Emergency Medicine

## 2018-03-16 ENCOUNTER — Other Ambulatory Visit: Payer: Self-pay | Admitting: Family Medicine

## 2018-03-17 MED ORDER — ACETAMINOPHEN-CODEINE #3 300-30 MG PO TABS: 1.0000 | ORAL_TABLET | Freq: Four times a day (QID) | ORAL | 0 refills | Status: DC | PRN

## 2018-03-23 ENCOUNTER — Other Ambulatory Visit: Payer: Self-pay

## 2018-03-23 ENCOUNTER — Encounter: Payer: Self-pay | Admitting: Family Medicine

## 2018-03-23 ENCOUNTER — Ambulatory Visit: Payer: Self-pay | Admitting: Family Medicine

## 2018-03-23 VITALS — BP 152/88 | HR 80 | Temp 98.0°F | Resp 18 | Ht 72.25 in | Wt 238.0 lb

## 2018-03-23 DIAGNOSIS — F17219 Nicotine dependence, cigarettes, with unspecified nicotine-induced disorders: Secondary | ICD-10-CM

## 2018-03-23 DIAGNOSIS — M5441 Lumbago with sciatica, right side: Secondary | ICD-10-CM

## 2018-03-23 DIAGNOSIS — M150 Primary generalized (osteo)arthritis: Secondary | ICD-10-CM

## 2018-03-23 DIAGNOSIS — M159 Polyosteoarthritis, unspecified: Secondary | ICD-10-CM

## 2018-03-23 DIAGNOSIS — E119 Type 2 diabetes mellitus without complications: Secondary | ICD-10-CM

## 2018-03-23 DIAGNOSIS — M7061 Trochanteric bursitis, right hip: Secondary | ICD-10-CM

## 2018-03-23 MED ORDER — PREDNISONE 10 MG PO TABS: ORAL_TABLET | ORAL | 0 refills | Status: DC

## 2018-03-23 NOTE — Patient Instructions (Addendum)
Please return as scheduled for cpe.   Ice your right hip 2-3x/day for about 10 minutes. If you have any questions or concerns, please don't hesitate to send me a message via MyChart or call the office at [phone]. Thank you for visiting with [place] today! It's our pleasure caring for you.   Hip Bursitis Hip bursitis is inflammation of a fluid-filled sac (bursa) in the hip joint. The bursa protects the bones in the hip joint from rubbing against each other. Hip bursitis can cause mild to moderate pain, and symptoms often come and go over time. What are the causes? This condition may be caused by:  Injury to the hip.  Overuse of the muscles that surround the hip joint.  Arthritis or gout.  Diabetes.  Thyroid disease.  Cold weather.  Infection.  In some cases, the cause may not be known. What are the signs or symptoms? Symptoms of this condition may include:  Mild or moderate pain in the hip area. Pain may get worse with movement.  Tenderness and swelling of the hip, especially on the outer side of the hip.  Symptoms may come and go. If the bursa becomes infected, you may have the following symptoms:  Fever.  Red skin and a feeling of warmth in the hip area.  How is this diagnosed? This condition may be diagnosed based on:  A physical exam.  Your medical history.  X-rays.  Removal of fluid from your inflamed bursa for testing (biopsy).  You may be sent to a health care provider who specializes in bone diseases (orthopedist) or a provider who specializes in joint inflammation (rheumatologist). How is this treated? This condition is treated by resting, raising (elevating), and applying pressure(compression) to the injured area. In some cases, this may be enough to make your symptoms go away. Treatment may also include:  Crutches.  Antibiotic medicine.  Draining fluid out of the bursa to help relieve swelling.  Injecting medicine that helps to reduce  inflammation (cortisone).  Follow these instructions at home: Medicines  Take over-the-counter and prescription medicines only as told by your health care provider.  Do not drive or operate heavy machinery while taking prescription pain medicine, or as told by your health care provider.  If you were prescribed an antibiotic, take it as told by your health care provider. Do not stop taking the antibiotic even if you start to feel better. Activity  Return to your normal activities as told by your health care provider. Ask your health care provider what activities are safe for you.  Rest and protect your hip as much as possible until your pain and swelling get better. General instructions  Wear compression wraps only as told by your health care provider.  Elevate your hip above the level of your heart as much as you can without pain. To do this, try putting a pillow under your hips while you lie down.  Do not use your hip to support your body weight until your health care provider says that you can. Use crutches as told by your health care provider.  Gently massage and stretch your injured area as often as is comfortable.  Keep all follow-up visits as told by your health care provider. This is important. How is this prevented?  Exercise regularly, as told by your health care provider.  Warm up and stretch before being active.  Cool down and stretch after being active.  If an activity irritates your hip or causes pain, avoid the activity  as much as possible.  Avoid sitting down for long periods at a time. Contact a health care provider if:  You have a fever.  You develop new symptoms.  You have difficulty walking or doing everyday activities.  You have pain that gets worse or does not get better with medicine.  You develop red skin or a feeling of warmth in your hip area. Get help right away if:  You cannot move your hip.  You have severe pain. This information is not  intended to replace advice given to you by your health care provider. Make sure you discuss any questions you have with your health care provider. Document Released: 11/09/2001 Document Revised: 10/26/2015 Document Reviewed: 12/20/2014 Elsevier Interactive Patient Education  2018 [place].  Sciatica Sciatica is pain, numbness, weakness, or tingling along the path of the sciatic nerve. The sciatic nerve starts in the lower back and runs down the back of each leg. The nerve controls the muscles in the lower leg and in the back of the knee. It also provides feeling (sensation) to the back of the thigh, the lower leg, and the sole of the foot. Sciatica is a symptom of another medical condition that pinches or puts pressure on the sciatic nerve. Generally, sciatica only affects one side of the body. Sciatica usually goes away on its own or with treatment. In some cases, sciatica may keep coming back (recur). What are the causes? This condition is caused by pressure on the sciatic nerve, or pinching of the sciatic nerve. This may be the result of:  A disk in between the bones of the spine (vertebrae) bulging out too far (herniated disk).  Age-related changes in the spinal disks (degenerative disk disease).  A pain disorder that affects a muscle in the buttock (piriformis syndrome).  Extra bone growth (bone spur) near the sciatic nerve.  An injury or break (fracture) of the pelvis.  Pregnancy.  Tumor (rare).  What increases the risk? The following factors may make you more likely to develop this condition:  Playing sports that place pressure or stress on the spine, such as football or weight lifting.  Having poor strength and flexibility.  A history of back injury.  A history of back surgery.  Sitting for long periods of time.  Doing activities that involve repetitive bending or lifting.  Obesity.  What are the signs or symptoms? Symptoms can vary from mild to very severe, and  they may include:  Any of these problems in the lower back, leg, hip, or buttock: ? Mild tingling or dull aches. ? Burning sensations. ? Sharp pains.  Numbness in the back of the calf or the sole of the foot.  Leg weakness.  Severe back pain that makes movement difficult.  These symptoms may get worse when you cough, sneeze, or laugh, or when you sit or stand for long periods of time. Being overweight may also make symptoms worse. In some cases, symptoms may recur over time. How is this diagnosed? This condition may be diagnosed based on:  Your symptoms.  A physical exam. Your health care provider may ask you to do certain movements to check whether those movements trigger your symptoms.  You may have tests, including: ? Blood tests. ? X-rays. ? MRI. ? CT scan.  How is this treated? In many cases, this condition improves on its own, without any treatment. However, treatment may include:  Reducing or modifying physical activity during periods of pain.  Exercising and stretching to  strengthen your abdomen and improve the flexibility of your spine.  Icing and applying heat to the affected area.  Medicines that help: ? To relieve pain and swelling. ? To relax your muscles.  Injections of medicines that help to relieve pain, irritation, and inflammation around the sciatic nerve (steroids).  Surgery.  Follow these instructions at home: Medicines  Take over-the-counter and prescription medicines only as told by your health care provider.  Do not drive or operate heavy machinery while taking prescription pain medicine. Managing pain  If directed, apply ice to the affected area. ? Put ice in a plastic bag. ? Place a towel between your skin and the bag. ? Leave the ice on for 20 minutes, 2-3 times a day.  After icing, apply heat to the affected area before you exercise or as often as told by your health care provider. Use the heat source that your health care provider  recommends, such as a moist heat pack or a heating pad. ? Place a towel between your skin and the heat source. ? Leave the heat on for 20-30 minutes. ? Remove the heat if your skin turns bright red. This is especially important if you are unable to feel pain, heat, or cold. You may have a greater risk of getting burned. Activity  Return to your normal activities as told by your health care provider. Ask your health care provider what activities are safe for you. ? Avoid activities that make your symptoms worse.  Take brief periods of rest throughout the day. Resting in a lying or standing position is usually better than sitting to rest. ? When you rest for longer periods, mix in some mild activity or stretching between periods of rest. This will help to prevent stiffness and pain. ? Avoid sitting for long periods of time without moving. Get up and move around at least one time each hour.  Exercise and stretch regularly, as told by your health care provider.  Do not lift anything that is heavier than 10 lb (4.5 kg) while you have symptoms of sciatica. When you do not have symptoms, you should still avoid heavy lifting, especially repetitive heavy lifting.  When you lift objects, always use proper lifting technique, which includes: ? Bending your knees. ? Keeping the load close to your body. ? Avoiding twisting. General instructions  Use good posture. ? Avoid leaning forward while sitting. ? Avoid hunching over while standing.  Maintain a healthy weight. Excess weight puts extra stress on your back and makes it difficult to maintain good posture.  Wear supportive, comfortable shoes. Avoid wearing high heels.  Avoid sleeping on a mattress that is too soft or too hard. A mattress that is firm enough to support your back when you sleep may help to reduce your pain.  Keep all follow-up visits as told by your health care provider. This is important. Contact a health care provider  if:  You have pain that wakes you up when you are sleeping.  You have pain that gets worse when you lie down.  Your pain is worse than you have experienced in the past.  Your pain lasts longer than 4 weeks.  You experience unexplained weight loss. Get help right away if:  You lose control of your bowel or bladder (incontinence).  You have: ? Weakness in your lower back, pelvis, buttocks, or legs that gets worse. ? Redness or swelling of your back. ? A burning sensation when you urinate. This information is not intended  to replace advice given to you by your health care provider. Make sure you discuss any questions you have with your health care provider. Document Released: 05/14/2001 Document Revised: 10/24/2015 Document Reviewed: 01/27/2015 Elsevier Interactive Patient Education  [employer].

## 2018-03-23 NOTE — Progress Notes (Signed)
Subjective  CC:  Chief Complaint  Patient presents with  . Hip Pain    Right Hip/Leg Pain    HPI: Jason Snyder is a 67 y.o. male who presents to the office today to address the problems listed above in the chief complaint.  67 year old with history of chronic low back pain and intermittent Tylenol 3 presents for worsening sciatica with radicular pain to the knee on the right and right lateral hip pain ongoing for the last 4 to 6 days.  Symptoms started after taking his grandchildren to the fair.  He has been doing a lot more walking.  He denies bowel or bladder dysfunction.  Pain is worse while trying to sleep at night, lying on that side, using a pillow between his legs which has been helpful and with walking.  No knee pain or swelling.  Recent rotator cuff tendinitis treated with prednisone, partially improved.  Type 2 diabetes which has been well controlled.  Does not check home sugars.  Drinks plenty of water.  No symptoms of hyperglycemia now Assessment  1. Greater trochanteric bursitis of right hip   2. Acute right-sided low back pain with right-sided sciatica   3. Cigarette nicotine dependence with nicotine-induced disorder   4. Primary osteoarthritis involving multiple joints   5. Controlled type 2 diabetes mellitus without complication, without long-term current use of insulin (HCC)      Plan   Back pain and hip pain related to sciatica and hip bursitis, related to increased walking: [place] therapy, prednisone taper, stretching exercises discussed and talked.  He has appointment to 2 weeks for recheck.  At that time consider working on core strengthening exercises.  May use Tylenol No. 3 if needed.  Cigarette dependence, non-smoker for 3 weeks, encouraged to continue abstinence  Type 2 diabetes: Warned about side effects of second prednisone burst.  Increase water intake, eliminate sweets.  Monitor for symptoms of hyperglycemia.  Follow up: Return for as scheduled, complete  physical.   No orders of the defined types were placed in this encounter.  Meds ordered this encounter  Medications  . predniSONE (DELTASONE) 10 MG tablet    Sig: Take 4 tabs qd x 2 days, 3 qd x 2 days, 2 qd x 2d, 1qd x 3 days    Dispense:  21 tablet    Refill:  0      I reviewed the patients updated PMH, FH, and SocHx.    Patient Active Problem List   Diagnosis Date Noted  . Obesity (BMI 30.0-34.9) 10/03/2017  . Nicotine dependence with nicotine-induced disorder 10/03/2017  . Rosacea 10/03/2017  . Smoking greater than 30 pack years 10/03/2017  . BPH without obstruction/lower urinary tract symptoms 10/03/2017  . Gastroesophageal reflux disease 01/10/2015  . Primary osteoarthritis involving multiple joints 01/10/2015  . Controlled type 2 diabetes mellitus without complication, without long-term current use of insulin ([place]) 07/12/2014  . COPD (chronic obstructive pulmonary disease) ([place]) 01/07/2014  . Chronic pain 10/07/2013  . OSA on CPAP 10/07/2013  . Low back pain 12/07/2012  . 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 Essential hypertension 08/31/2010  . 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   Current Meds  Medication Sig  . acetaminophen-codeine (TYLENOL #3) 300-30 MG tablet Take 1 tablet by mouth every 6 (six) hours as needed for moderate pain.  Marland Kitchen albuterol (VENTOLIN HFA) 108 (90 Base) MCG/ACT inhaler INHALE 2 PUFFS BY MOUTH EVERY 4 HOURS AS NEEDED FOR WHEEZING  . amLODipine (NORVASC) 10 MG tablet Take 1  tablet (10 mg total) by mouth daily.  Marland Kitchen aspirin 81 MG EC tablet Take by mouth.  Marland Kitchen atorvastatin (LIPITOR) 10 MG tablet Take 1 tablet (10 mg total) by mouth at bedtime.  . cyclobenzaprine (FLEXERIL) 10 MG tablet Take 1 tablet (10 mg total) by mouth 3 (three) times daily as needed for muscle spasms.  . diclofenac sodium (VOLTAREN) 1 % GEL PLACE 2 GRAMS ON TO THE SKIN 4 TIMES A DAY AS DIRECTED  . fluticasone (FLONASE) 50 MCG/ACT nasal spray Place into the nose.  Marland Kitchen glipiZIDE (GLUCOTROL  XL) 10 MG 24 hr tablet Take 1 tablet (10 mg total) by mouth daily with breakfast.  . hydrochlorothiazide (HYDRODIURIL) 25 MG tablet Take 1 tablet (25 mg total) by mouth daily.  Marland Kitchen ipratropium (ATROVENT HFA) 17 MCG/ACT inhaler Inhale 2 puffs into the lungs 2 (two) times daily.  . Loratadine (CLARITIN) 10 MG CAPS Take by mouth.  . losartan (COZAAR) 100 MG tablet Take 1 tablet (100 mg total) by mouth daily.  . metoprolol tartrate (LOPRESSOR) 50 MG tablet Take 1 tablet (50 mg total) by mouth 2 (two) times daily.  . Multiple Vitamin (MULTIVITAMIN) capsule Take by mouth.  . neomycin-polymyxin-hydrocortisone (CORTISPORIN) OTIC solution PLACE 3 DROPS IN AFFECTED EAR(S) 3 TIMES A DAY AS NEEDED.  Marland Kitchen predniSONE (DELTASONE) 10 MG tablet Take 4 tabs qd x 2 days, 3 qd x 2 days, 2 qd x 2d, 1qd x 3 days  . tamsulosin (FLOMAX) 0.4 MG CAPS capsule Take 1 capsule (0.4 mg total) by mouth daily.  Marland Kitchen testosterone cypionate (DEPOTESTOSTERONE CYPIONATE) 200 MG/ML injection inject 1 ml into the muscle every 14 days  . [DISCONTINUED] predniSONE (DELTASONE) 10 MG tablet Take 4 tabs qd x 2 days, 3 qd x 2 days, 2 qd x 2d, 1qd x 3 days    Allergies: Patient is allergic to rubus fruticosus. Family History: Patient family history includes Heart disease in his father and maternal uncle; Lung cancer in his father and sister; Pulmonary embolism in his mother. Social History:  Patient  reports that he has been smoking cigarettes. He has a 15.00 pack-year smoking history. He uses smokeless tobacco. He reports that he drank alcohol. He reports that he has current or past drug history.  Review of Systems: Constitutional: Negative for fever malaise or anorexia Cardiovascular: negative for chest pain Respiratory: negative for SOB or persistent cough Gastrointestinal: negative for abdominal pain  Objective  Vitals: BP (!) 152/88   Pulse 80   Temp 98 F (36.7 C) (Oral)   Resp 18   Ht 6' 0.25" (1.835 m)   Wt 238 lb (108 kg)    SpO2 98%   BMI 32.06 kg/m  General: no acute distress , A&Ox3 Back: No SI joint tenderness, right sciatic notch tender, right greater trochanteric bursa tender, negative straight leg raise bilaterally, range of motion of the hips normal.  Full range of motion of back with pain with extension and right lateral flexion.  Asymmetric seated hip brace.  He is able to do a glute bridge.     Commons side effects, risks, benefits, and alternatives for medications and treatment plan prescribed today were discussed, and the patient expressed understanding of the given instructions. Patient is instructed to call or message via MyChart if he/she has any questions or concerns regarding our treatment plan. No barriers to understanding were identified. We discussed Red Flag symptoms and signs in detail. Patient expressed understanding regarding what to do in case of urgent or emergency type  symptoms.   Medication list was reconciled, printed and provided to the patient in AVS. Patient instructions and summary information was reviewed with the patient as documented in the AVS. This note was prepared with assistance of Dragon voice recognition software. Occasional wrong-word or sound-a-like substitutions may have occurred due to the inherent limitations of voice recognition software

## 2018-04-06 ENCOUNTER — Encounter: Payer: Self-pay | Admitting: Family Medicine

## 2018-04-06 ENCOUNTER — Other Ambulatory Visit: Payer: Self-pay

## 2018-04-06 ENCOUNTER — Ambulatory Visit (INDEPENDENT_AMBULATORY_CARE_PROVIDER_SITE_OTHER): Payer: Medicare Other | Admitting: Family Medicine

## 2018-04-06 VITALS — BP 132/80 | HR 63 | Temp 98.2°F | Ht 72.25 in | Wt 243.6 lb

## 2018-04-06 DIAGNOSIS — J449 Chronic obstructive pulmonary disease, unspecified: Secondary | ICD-10-CM | POA: Diagnosis not present

## 2018-04-06 DIAGNOSIS — M159 Polyosteoarthritis, unspecified: Secondary | ICD-10-CM

## 2018-04-06 DIAGNOSIS — M150 Primary generalized (osteo)arthritis: Secondary | ICD-10-CM

## 2018-04-06 DIAGNOSIS — K219 Gastro-esophageal reflux disease without esophagitis: Secondary | ICD-10-CM | POA: Diagnosis not present

## 2018-04-06 DIAGNOSIS — G4733 Obstructive sleep apnea (adult) (pediatric): Secondary | ICD-10-CM | POA: Diagnosis not present

## 2018-04-06 DIAGNOSIS — Z1159 Encounter for screening for other viral diseases: Secondary | ICD-10-CM

## 2018-04-06 DIAGNOSIS — E291 Testicular hypofunction: Secondary | ICD-10-CM

## 2018-04-06 DIAGNOSIS — M5441 Lumbago with sciatica, right side: Secondary | ICD-10-CM | POA: Diagnosis not present

## 2018-04-06 DIAGNOSIS — Z9989 Dependence on other enabling machines and devices: Secondary | ICD-10-CM

## 2018-04-06 DIAGNOSIS — I10 Essential (primary) hypertension: Secondary | ICD-10-CM

## 2018-04-06 DIAGNOSIS — E119 Type 2 diabetes mellitus without complications: Secondary | ICD-10-CM | POA: Diagnosis not present

## 2018-04-06 LAB — COMPREHENSIVE METABOLIC PANEL
ALK PHOS: 60 U/L (ref 39–117)
ALT: 40 U/L (ref 0–53)
AST: 25 U/L (ref 0–37)
Albumin: 4.9 g/dL (ref 3.5–5.2)
BILIRUBIN TOTAL: 0.8 mg/dL (ref 0.2–1.2)
BUN: 14 mg/dL (ref 6–23)
CALCIUM: 10 mg/dL (ref 8.4–10.5)
CO2: 26 mEq/L (ref 19–32)
Chloride: 98 mEq/L (ref 96–112)
Creatinine, Ser: 0.95 mg/dL (ref 0.40–1.50)
GFR: 84.57 mL/min (ref >60.00)
Glucose, Bld: 136 mg/dL — ABNORMAL HIGH (ref 70–99)
POTASSIUM: 4.4 meq/L (ref 3.5–5.1)
Sodium: 136 mEq/L (ref 135–145)
TOTAL PROTEIN: 7.2 g/dL (ref 6.0–8.3)

## 2018-04-06 LAB — POCT GLYCOSYLATED HEMOGLOBIN (HGB A1C): Hemoglobin A1C: 6.5 % — AB (ref 4.0–5.6)

## 2018-04-06 LAB — CBC WITH DIFFERENTIAL/PLATELET
BASOS ABS: 0.1 10*3/uL (ref 0.0–0.1)
Basophils Relative: 0.7 % (ref 0.0–3.0)
EOS PCT: 2.1 % (ref 0.0–5.0)
Eosinophils Absolute: 0.2 10*3/uL (ref 0.0–0.7)
HEMATOCRIT: 47.4 % (ref 39.0–52.0)
Hemoglobin: 16.2 g/dL (ref 13.0–17.0)
LYMPHS ABS: 1.6 10*3/uL (ref 0.7–4.0)
LYMPHS PCT: 20.1 % (ref 12.0–46.0)
MCHC: 34.3 g/dL (ref 30.0–36.0)
MCV: 98.9 fl (ref 78.0–100.0)
MONOS PCT: 6.3 % (ref 3.0–12.0)
Monocytes Absolute: 0.5 10*3/uL (ref 0.1–1.0)
NEUTROS ABS: 5.5 10*3/uL (ref 1.4–7.7)
NEUTROS PCT: 70.8 % (ref 43.0–77.0)
Platelets: 217 10*3/uL (ref 150.0–400.0)
RBC: 4.79 Mil/uL (ref 4.22–5.81)
RDW: 14.1 % (ref 11.5–15.5)
WBC: 7.8 10*3/uL (ref 4.0–10.5)

## 2018-04-06 LAB — LIPID PANEL
Cholesterol: 149 mg/dL (ref 0–200)
HDL: 42.2 mg/dL (ref >39.00)
LDL Cholesterol: 80 mg/dL (ref 0–99)
NONHDL: 107.23
Total CHOL/HDL Ratio: 4
Triglycerides: 137 mg/dL (ref 0.0–149.0)
VLDL: 27.4 mg/dL (ref 0.0–40.0)

## 2018-04-06 LAB — MICROALBUMIN / CREATININE URINE RATIO
CREATININE, U: 47.4 mg/dL
MICROALB/CREAT RATIO: 1.5 mg/g (ref 0.0–30.0)
Microalb, Ur: <0.7 mg/dL (ref 0.0–1.9)

## 2018-04-06 LAB — TSH: TSH: 1.44 u[IU]/mL (ref 0.35–4.50)

## 2018-04-06 NOTE — Patient Instructions (Signed)
Please return in 3 months for recheck diabetes and smoking/copd. AWV as well.  We will call you with information regarding your referral appointment. Dr. Layne Benton, sports medicine.  If you do not hear from [place] within the next 2 weeks, please let me know. It can take 1-2 weeks to get appointments set up with the specialists.   If you have any questions or concerns, please don't hesitate to send me a message via MyChart or call the office at [phone]. Thank you for visiting with [place] today! It's our pleasure caring for you.  Medicare recommends an Annual Wellness Visit for all patients. Please schedule this to be done with our Nurse Educator, Maudie Mercury. This is an informative "talk" visit; it's goals are to ensure that your health care needs are being met and to give you education regarding avoiding falls, ensuring you are not suffering from depression or problems with memory or thinking, and to educate you on Advance Care Planning. It helps me take good care of you!  Please do these things to maintain good health!   Exercise at least 30-45 minutes a day,  4-5 days a week.   Eat a low-fat diet with lots of fruits and vegetables, up to 7-9 servings per day.  Drink plenty of water daily. Try to drink 8 8oz glasses per day.  Seatbelts can save your life. Always wear your seatbelt.  Place Smoke Detectors on every level of your home and check batteries every year.  Eye Doctor - have an eye exam every 1-2 years  Safe sex - use condoms to protect yourself from STDs if you could be exposed to these types of infections.  Avoid heavy alcohol use. If you drink, keep it to less than 2 drinks/day and not every day.  [place].  Choose someone you trust that could speak for you if you became unable to speak for yourself.  Depression is common in our stressful world.If you're feeling down or losing interest in things you normally enjoy, please come in for a visit.

## 2018-04-06 NOTE — Progress Notes (Signed)
Subjective  Chief Complaint  Patient presents with  . Annual Exam    doing well,   . Leg Pain    still having right leg and hip pain, sterioids did not seem to help   . Diabetes    last a1c 10/2017    HPI: Jason Snyder is a 67 y.o. male who presents to [facility] today for a Male Wellness Visit. He also has the concerns and/or needs as listed above in the chief complaint. These will be addressed in addition to the Health Maintenance Visit.   Wellness Visit: annual visit with health maintenance review and exam    HM: now with medicare insurance. Ready for labwork. Fasting today. Will be due for CRC screen in feb 2020. Eligible for lung cancer screening as well  Has quit smoking x 4 weeks!! Doing well.   Lifestyle: Body mass index is 32.81 kg/m. Wt Readings from Last 3 Encounters:  04/06/18 243 lb 9.6 oz (110.5 kg)  03/23/18 238 lb (108 kg)  02/03/18 242 lb 12.8 oz (110.1 kg)   Diet: general  Chronic disease management visit and/or acute problem visit:  DM - doing fine. On meds   Low T - about 8 days out from last shot. Feels ok  Low back pain, chronic on tylenol # 3 qid per last PCP. Hasn't had f/u for > 10 years due to lack of insurance. Now with persistent right low back pain. See last not. Not responsive to prednisone taper. Radiates at times to groin and back of knee. No weakness.   GERD and COPD - reports stable. Hasn't had either worked up due to lack of insurance. Uses albuterol rarely. No longer smoking.   HTN is doing fine.  Lab Results  Component Value Date   HGBA1C 6.5 (A) 04/06/2018   HGBA1C 6.6 10/03/2017    Lab Results  Component Value Date   LDLCALC 68 04/21/2017    Patient Active Problem List   Diagnosis Date Noted  . Obesity (BMI 30.0-34.9) 10/03/2017  . Nicotine dependence with nicotine-induced disorder 10/03/2017  . Rosacea 10/03/2017  . Smoking greater than 30 pack years 10/03/2017  . BPH without  obstruction/lower urinary tract symptoms 10/03/2017  . Gastroesophageal reflux disease 01/10/2015  . Primary osteoarthritis involving multiple joints 01/10/2015  . Controlled type 2 diabetes mellitus without complication, without long-term current use of insulin ([place]) 07/12/2014  . COPD (chronic obstructive pulmonary disease) ([place]) 01/07/2014  . Chronic pain 10/07/2013  . OSA on CPAP 10/07/2013  . Low back pain 12/07/2012  . 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 Essential hypertension 08/31/2010  . 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   Health Maintenance  Topic Date Due  . HEMOGLOBIN A1C  04/05/2018  . Hepatitis C Screening  10/04/2018 (Originally 01-18-1951)  . Fecal DNA (Cologuard)  07/18/2018  . FOOT EXAM  10/04/2018  . OPHTHALMOLOGY EXAM  03/05/2019  . TETANUS/TDAP  01/09/2025  . INFLUENZA VACCINE  Completed  . HIV Screening  Discontinued   Immunization History  Administered Date(s) Administered  . Hepatitis B, ped/adol 06/04/2003  . Influenza Split 03/03/2010  . Influenza, High Dose Seasonal PF 03/21/2017, 03/03/2018  . Influenza, Seasonal, Injecte, Preservative Fre 03/23/2014, 04/12/2015  . Influenza,inj,Quad PF,6+ Mos 04/01/2016  . Pneumococcal Polysaccharide-23 06/04/2003  . Td 06/04/2003  . Tdap 01/10/2015  . Zoster 09/18/2015   We updated and reviewed the patient's past history in detail and it is documented below. Allergies: Patient is allergic to rubus fruticosus. Past  Medical History  has a past medical history of BMI 30.0-30.9,adult, Chronic left shoulder pain (c), Chronic low back pain with right-sided sciatica, DDD (degenerative disc disease), lumbar, Diabetes mellitus without complication ([place]), History of lumbar surgery, Hyperlipidemia, Hypertension, Nicotine dependence, cigarettes, uncomplicated, Obesity (BMI 30-39.9), Rosacea, and Thoracic and lumbosacral neuritis. Past Surgical History Patient  has a past surgical history that includes Shoulder arthroscopy; Spine  surgery; and Arthroscopic repair ACL. Social History Patient  reports that he quit smoking about 4 weeks ago. His smoking use included cigarettes. He has a 15.00 pack-year smoking history. He uses smokeless tobacco. He reports that he drank alcohol. He reports that he has current or past drug history. Family History family history includes Heart disease in his father and maternal uncle; Lung cancer in his father and sister; Pulmonary embolism in his mother. Review of Systems: Constitutional: negative for fever or malaise Ophthalmic: negative for photophobia, double vision or loss of vision Cardiovascular: negative for chest pain, dyspnea on exertion, or new LE swelling Respiratory: negative for SOB or persistent cough Gastrointestinal: negative for abdominal pain, change in bowel habits or melena Genitourinary: negative for dysuria or gross hematuria Musculoskeletal: negative for new gait disturbance or muscular weakness Integumentary: negative for new or persistent rashes Neurological: negative for TIA or stroke symptoms Psychiatric: negative for SI or delusions Allergic/Immunologic: negative for hives  Patient Care Team    Relationship Specialty Notifications Start End  [name], MD PCP - General Family Medicine  10/03/17    Objective  Vitals: BP 132/80   Pulse 63   Temp 98.2 F (36.8 C)   Ht 6' 0.25" (1.835 m)   Wt 243 lb 9.6 oz (110.5 kg)   SpO2 98%   BMI 32.81 kg/m  General:  Well developed, well nourished, no acute distress  Psych:  Alert and orientedx3,normal mood and affect HEENT:  Normocephalic, atraumatic, non-icteric sclera, PERRL, oropharynx is clear without mass or exudate, supple neck without adenopathy, mass or thyromegaly Cardiovascular:  Normal S1, S2, RRR without gallop, rub or murmur, nondisplaced PMI, +2 distal pulses in bilateral upper and lower extremities. Respiratory:  Good breath sounds bilaterally, CTAB with normal respiratory  effort Gastrointestinal: normal bowel sounds, soft, non-tender, no noted masses. No HSM MSK: no deformities, contusions. Joints are without erythema or swelling. Spine and CVA region are nontender + tender right SI joint w/ negative SLR. Nl gait.  Skin:  Warm, no rashes or suspicious lesions noted Neurologic:    Mental status is normal. CN 2-11 are normal. Gross motor and sensory exams are normal. Stable gait. No tremor GU: No inguinal hernias or adenopathy are appreciated bilaterally   Assessment  1. Controlled type 2 diabetes mellitus without complication, without long-term current use of insulin ([place])   2. Essential hypertension   3. Primary osteoarthritis involving multiple joints   4. Need for hepatitis C screening test   5. Acute right-sided low back pain with right-sided sciatica   6. Chronic obstructive pulmonary disease, unspecified COPD type ([place])   7. Gastroesophageal reflux disease, esophagitis presence not specified   8. Hypogonadism male   45. OSA on CPAP      Plan  Male Wellness Visit:  Age appropriate Health Maintenance and Prevention measures were discussed with patient. Included topics are cancer screening recommendations, ways to keep healthy (see AVS) including dietary and exercise recommendations, regular eye and dental care, use of seat belts, and avoidance of moderate alcohol use and tobacco use. Praised for quitting smoking.  BMI: discussed patient's BMI and encouraged positive lifestyle modifications to help get to or maintain a target BMI.  HM needs and immunizations were addressed and ordered. See below for orders. See HM and immunization section for updates.  Routine labs and screening tests ordered including cmp, cbc and lipids where appropriate.  Discussed recommendations regarding Vit D and calcium supplementation (see AVS)  Chronic disease f/u and/or acute problem visit: (deemed necessary to be done in addition to the wellness visit):  Chronic back  pain on narcotics with persistent new right SI and low back pain: refer to sports med for evaluation and recommendations.   HTN and DM are controlled. Check labs  Copd- will work on evaluating with pfts and lung cancer screen in near future. Stable clinically now. Just stopped smoking. rec checking pfts in 3-6 months.  Recheck testosterone levels.   Continue cpap.   Follow up: Return in about 3 months (around 07/07/2018) for follow up Diabetes and smoking copd.   Commons side effects, risks, benefits, and alternatives for medications and treatment plan prescribed today were discussed, and the patient expressed understanding of the given instructions. Patient is instructed to call or message via MyChart if he/she has any questions or concerns regarding our treatment plan. No barriers to understanding were identified. We discussed Red Flag symptoms and signs in detail. Patient expressed understanding regarding what to do in case of urgent or emergency type symptoms.   Medication list was reconciled, printed and provided to the patient in AVS. Patient instructions and summary information was reviewed with the patient as documented in the AVS. This note was prepared with assistance of Dragon voice recognition software. Occasional wrong-word or sound-a-like substitutions may have occurred due to the inherent limitations of voice recognition software  Orders Placed This Encounter  Procedures  . CBC with Differential/Platelet  . Comprehensive metabolic panel  . Lipid panel  . Hepatitis C antibody  . Microalbumin / creatinine urine ratio  . Testosterone Total,Free,Bio, Males  . TSH  . Ambulatory referral to Orthopedic Surgery  . POCT glycosylated hemoglobin (Hb A1C)   No orders of the defined types were placed in this encounter.

## 2018-04-07 LAB — TESTOSTERONE TOTAL,FREE,BIO, MALES
Albumin: 4.6 g/dL (ref 3.6–5.1)
SEX HORMONE BINDING: 19 nmol/L — AB (ref 22–77)
TESTOSTERONE FREE: 222.3 pg/mL (ref 46.0–224.0)
TESTOSTERONE: 944 ng/dL — AB (ref 250–827)
Testosterone, Bioavailable: 466.9 ng/dL (ref >=110.0)

## 2018-04-07 LAB — HEPATITIS C ANTIBODY
Hepatitis C Ab: NONREACTIVE
SIGNAL TO CUT-OFF: 0.04 (ref <1.00)

## 2018-04-15 DIAGNOSIS — M47816 Spondylosis without myelopathy or radiculopathy, lumbar region: Secondary | ICD-10-CM | POA: Diagnosis not present

## 2018-04-15 DIAGNOSIS — M7061 Trochanteric bursitis, right hip: Secondary | ICD-10-CM | POA: Diagnosis not present

## 2018-04-15 DIAGNOSIS — M1611 Unilateral primary osteoarthritis, right hip: Secondary | ICD-10-CM | POA: Diagnosis not present

## 2018-04-19 ENCOUNTER — Other Ambulatory Visit: Payer: Self-pay | Admitting: Family Medicine

## 2018-04-20 MED ORDER — ACETAMINOPHEN-CODEINE #3 300-30 MG PO TABS: 1.0000 | ORAL_TABLET | Freq: Four times a day (QID) | ORAL | 0 refills | Status: DC | PRN

## 2018-04-20 NOTE — Telephone Encounter (Signed)
Last OV 04/06/18 Tylenol #3 last filled 03/17/18 #90 with 0

## 2018-04-26 ENCOUNTER — Other Ambulatory Visit: Payer: Self-pay | Admitting: Family Medicine

## 2018-05-07 DIAGNOSIS — M1611 Unilateral primary osteoarthritis, right hip: Secondary | ICD-10-CM | POA: Diagnosis not present

## 2018-05-20 DIAGNOSIS — M461 Sacroiliitis, not elsewhere classified: Secondary | ICD-10-CM | POA: Diagnosis not present

## 2018-06-09 ENCOUNTER — Other Ambulatory Visit: Payer: Self-pay | Admitting: Family Medicine

## 2018-06-09 DIAGNOSIS — M47816 Spondylosis without myelopathy or radiculopathy, lumbar region: Secondary | ICD-10-CM | POA: Diagnosis not present

## 2018-06-09 DIAGNOSIS — M461 Sacroiliitis, not elsewhere classified: Secondary | ICD-10-CM | POA: Diagnosis not present

## 2018-06-09 MED ORDER — ACETAMINOPHEN-CODEINE #3 300-30 MG PO TABS: 1.0000 | ORAL_TABLET | Freq: Four times a day (QID) | ORAL | 0 refills | Status: DC | PRN

## 2018-06-09 NOTE — Telephone Encounter (Signed)
Last OV: 04/20/18 Last Fill: 04/20/18 #90 no refills

## 2018-06-12 ENCOUNTER — Encounter: Payer: Self-pay | Admitting: Family Medicine

## 2018-06-12 ENCOUNTER — Other Ambulatory Visit: Payer: Self-pay

## 2018-06-12 ENCOUNTER — Ambulatory Visit (INDEPENDENT_AMBULATORY_CARE_PROVIDER_SITE_OTHER): Payer: Medicare Other | Admitting: Family Medicine

## 2018-06-12 VITALS — BP 126/80 | HR 70 | Temp 98.3°F | Resp 16 | Ht <= 58 in | Wt 247.6 lb

## 2018-06-12 DIAGNOSIS — H60312 Diffuse otitis externa, left ear: Secondary | ICD-10-CM | POA: Diagnosis not present

## 2018-06-12 DIAGNOSIS — Z1212 Encounter for screening for malignant neoplasm of rectum: Secondary | ICD-10-CM

## 2018-06-12 DIAGNOSIS — Z1211 Encounter for screening for malignant neoplasm of colon: Secondary | ICD-10-CM | POA: Diagnosis not present

## 2018-06-12 DIAGNOSIS — J0100 Acute maxillary sinusitis, unspecified: Secondary | ICD-10-CM | POA: Diagnosis not present

## 2018-06-12 MED ORDER — NEOMYCIN-POLYMYXIN-HC 3.5-10000-1 OT SOLN: 3.0000 [drp] | Freq: Four times a day (QID) | OTIC | 5 refills | Status: DC

## 2018-06-12 MED ORDER — AMOXICILLIN-POT CLAVULANATE 875-125 MG PO TABS: 1.0000 | ORAL_TABLET | Freq: Two times a day (BID) | ORAL | 0 refills | Status: DC

## 2018-06-12 MED ORDER — PNEUMOCOCCAL 13-VAL CONJ VACC IM SUSP: 0.5000 mL | Freq: Once | INTRAMUSCULAR | 0 refills | Status: AC

## 2018-06-12 NOTE — Progress Notes (Signed)
Subjective   CC:  Chief Complaint  Patient presents with  . Sinusitis    Started 2 days ago. States that his left ear and face is swollen and he has had some ear drainage.     HPI: Jason Snyder is a 68 y.o. male who presents to the office today to address the problems listed above in the chief complaint.  Patient has chronic ear eczema.  Recently active.  Ran out of his steroid drops.  Over the last 2 to 3 days with ear pain, external ear swelling with pain, and sinus pain with thick drainage, left facial tenderness and dental pain.  No fevers or chills.  No cough.  No shortness of breath.  Health maintenance: Due for Cologuard for colorectal cancer screening.  Defers colonoscopy.  Due Prevnar  I reviewed the patients updated PMH, FH, and SocHx.    Patient Active Problem List   Diagnosis Date Noted  . Obesity (BMI 30.0-34.9) 10/03/2017  . Nicotine dependence with nicotine-induced disorder 10/03/2017  . Rosacea 10/03/2017  . Smoking greater than 30 pack years 10/03/2017  . BPH without obstruction/lower urinary tract symptoms 10/03/2017  . Gastroesophageal reflux disease 01/10/2015  . Primary osteoarthritis involving multiple joints 01/10/2015  . Controlled type 2 diabetes mellitus without complication, without long-term current use of insulin ([place]) 07/12/2014  . COPD (chronic obstructive pulmonary disease) ([place]) 01/07/2014  . Chronic pain 10/07/2013  . OSA on CPAP 10/07/2013  . Low back pain 12/07/2012  . 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 Essential hypertension 08/31/2010  . 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   Current Meds  Medication Sig  . acetaminophen-codeine (TYLENOL #3) 300-30 MG tablet Take 1 tablet by mouth every 6 (six) hours as needed for moderate pain.  Marland Kitchen albuterol (VENTOLIN HFA) 108 (90 Base) MCG/ACT inhaler INHALE 2 PUFFS BY MOUTH EVERY 4 HOURS AS NEEDED FOR WHEEZING  . amLODipine (NORVASC) 10 MG tablet Take 1 tablet (10 mg total) by mouth daily.  Marland Kitchen aspirin 81 MG  EC tablet Take by mouth.  Marland Kitchen atorvastatin (LIPITOR) 10 MG tablet Take 1 tablet (10 mg total) by mouth at bedtime.  . diclofenac sodium (VOLTAREN) 1 % GEL PLACE 2 GRAMS ON TO THE SKIN 4 TIMES A DAY AS DIRECTED  . fluticasone (FLONASE) 50 MCG/ACT nasal spray Place into the nose.  Marland Kitchen glipiZIDE (GLUCOTROL XL) 10 MG 24 hr tablet Take 1 tablet (10 mg total) by mouth daily with breakfast.  . hydrochlorothiazide (HYDRODIURIL) 25 MG tablet Take 1 tablet (25 mg total) by mouth daily.  . Loratadine (CLARITIN) 10 MG CAPS Take by mouth.  . losartan (COZAAR) 100 MG tablet Take 1 tablet (100 mg total) by mouth daily.  . metoprolol tartrate (LOPRESSOR) 50 MG tablet TAKE ONE TABLET BY MOUTH TWICE DAILY   . Multiple Vitamin (MULTIVITAMIN) capsule Take by mouth.  . tamsulosin (FLOMAX) 0.4 MG CAPS capsule Take 1 capsule (0.4 mg total) by mouth daily.  Marland Kitchen testosterone cypionate (DEPOTESTOSTERONE CYPIONATE) 200 MG/ML injection inject 1 ml into the muscle every 14 days    Review of Systems: Cardiovascular: negative for chest pain Respiratory: negative for SOB or persistent cough Gastrointestinal: negative for abdominal pain Genitourinary: negative for dysuria or gross hematuria  Objective  Vitals: BP 126/80   Pulse 70   Temp 98.3 F (36.8 C) (Oral)   Resp 16   Ht (!) 6.25" (0.159 m)   Wt 247 lb 9.6 oz (112.3 kg)   SpO2 97%   BMI 4456.49 kg/m  General: no acute distress  Psych:  Alert and oriented, normal mood and affect HEENT:  Normocephalic, atraumatic, left TM is obstructed due to swollen external auditory canal and swelling of left ear with tenderness, left facial tenderness and swelling, oropharynx is clear  cardiovascular:  RRR without murmur or gallop. no peripheral edema Respiratory:  Good breath sounds bilaterally, CTAB with normal respiratory effort   Assessment  1. Acute non-recurrent maxillary sinusitis   2. Screening for colorectal cancer   3. Acute diffuse otitis externa of left ear       Plan   Sinusitis and left external auditory otitis and cellulitis.  Treat aggressively with Augmentin.  Corticosporin eardrops prescribed.  Follow-up if unimproved.  Health maintenance: Order Cologuard for colorectal cancer screening.  Prevnar prescription given.  Patient to get at the pharmacy. Follow up: Return for as scheduled.  For diabetes follow-up   Commons side effects, risks, benefits, and alternatives for medications and treatment plan prescribed today were discussed, and the patient expressed understanding of the given instructions. Patient is instructed to call or message via MyChart if he/she has any questions or concerns regarding our treatment plan. No barriers to understanding were identified. We discussed Red Flag symptoms and signs in detail. Patient expressed understanding regarding what to do in case of urgent or emergency type symptoms.   Medication list was reconciled, printed and provided to the patient in AVS. Patient instructions and summary information was reviewed with the patient as documented in the AVS. This note was prepared with assistance of Dragon voice recognition software. Occasional wrong-word or sound-a-like substitutions may have occurred due to the inherent limitations of voice recognition software  Orders Placed This Encounter  Procedures  . Cologuard   Meds ordered this encounter  Medications  . pneumococcal 13-valent conjugate vaccine (PREVNAR 13) SUSP injection    Sig: Inject 0.5 mLs into the muscle once for 1 dose.    Dispense:  0.5 mL    Refill:  0  . amoxicillin-clavulanate (AUGMENTIN) 875-125 MG tablet    Sig: Take 1 tablet by mouth 2 (two) times daily.    Dispense:  14 tablet    Refill:  0  . neomycin-polymyxin-hydrocortisone (CORTISPORIN) OTIC solution    Sig: Place 3 drops into the left ear 4 (four) times daily.    Dispense:  10 mL    Refill:  5

## 2018-06-12 NOTE — Patient Instructions (Addendum)
Please follow up as scheduled for your next visit with me. 07/08/2018  Antibiotics have been ordered for you today.  Please take the Prevnar RX to the pharmacy to get your pneumonia vaccination.   I have ordered Cologuard for you to complete for Colorectal Cancer screening. Please send in once you receive the kit.   If you have any questions or concerns, please don't hesitate to send me a message via MyChart or call the office at [phone]. Thank you for visiting with [place] today! It's our pleasure caring for you.

## 2018-06-25 DIAGNOSIS — Z23 Encounter for immunization: Secondary | ICD-10-CM | POA: Diagnosis not present

## 2018-06-25 DIAGNOSIS — Z1211 Encounter for screening for malignant neoplasm of colon: Secondary | ICD-10-CM | POA: Diagnosis not present

## 2018-06-25 DIAGNOSIS — Z1212 Encounter for screening for malignant neoplasm of rectum: Secondary | ICD-10-CM | POA: Diagnosis not present

## 2018-06-30 LAB — COLOGUARD: Cologuard: NEGATIVE

## 2018-07-01 ENCOUNTER — Encounter: Payer: Self-pay | Admitting: *Deleted

## 2018-07-08 ENCOUNTER — Ambulatory Visit (INDEPENDENT_AMBULATORY_CARE_PROVIDER_SITE_OTHER): Payer: Medicare Other | Admitting: Family Medicine

## 2018-07-08 ENCOUNTER — Encounter: Payer: Self-pay | Admitting: Family Medicine

## 2018-07-08 ENCOUNTER — Other Ambulatory Visit: Payer: Self-pay

## 2018-07-08 VITALS — BP 128/82 | HR 68 | Temp 97.8°F | Resp 16 | Ht <= 58 in | Wt 250.2 lb

## 2018-07-08 DIAGNOSIS — Z122 Encounter for screening for malignant neoplasm of respiratory organs: Secondary | ICD-10-CM

## 2018-07-08 DIAGNOSIS — G894 Chronic pain syndrome: Secondary | ICD-10-CM

## 2018-07-08 DIAGNOSIS — J449 Chronic obstructive pulmonary disease, unspecified: Secondary | ICD-10-CM

## 2018-07-08 DIAGNOSIS — G4733 Obstructive sleep apnea (adult) (pediatric): Secondary | ICD-10-CM

## 2018-07-08 DIAGNOSIS — N138 Other obstructive and reflux uropathy: Secondary | ICD-10-CM

## 2018-07-08 DIAGNOSIS — E119 Type 2 diabetes mellitus without complications: Secondary | ICD-10-CM

## 2018-07-08 DIAGNOSIS — I10 Essential (primary) hypertension: Secondary | ICD-10-CM

## 2018-07-08 DIAGNOSIS — N401 Enlarged prostate with lower urinary tract symptoms: Secondary | ICD-10-CM

## 2018-07-08 DIAGNOSIS — F17210 Nicotine dependence, cigarettes, uncomplicated: Secondary | ICD-10-CM | POA: Diagnosis not present

## 2018-07-08 DIAGNOSIS — Z9989 Dependence on other enabling machines and devices: Secondary | ICD-10-CM

## 2018-07-08 DIAGNOSIS — E291 Testicular hypofunction: Secondary | ICD-10-CM

## 2018-07-08 LAB — POCT GLYCOSYLATED HEMOGLOBIN (HGB A1C): Hemoglobin A1C: 7.3 % — AB (ref 4.0–5.6)

## 2018-07-08 MED ORDER — TAMSULOSIN HCL 0.4 MG PO CAPS: 0.8000 mg | ORAL_CAPSULE | Freq: Every day | ORAL | 3 refills | Status: DC

## 2018-07-08 MED ORDER — METFORMIN HCL 500 MG PO TABS: 500.0000 mg | ORAL_TABLET | Freq: Two times a day (BID) | ORAL | 3 refills | Status: DC

## 2018-07-08 NOTE — Patient Instructions (Signed)
Please return in 3 months for diabetes follow up Please add the metformin twice a day to your glipizide for your diabetes.   We will call you with information regarding your referral appointment. Urology and poulmonology. If you do not hear from [place] within the next 2 weeks, please let me know. It can take 1-2 weeks to get appointments set up with the specialists.    If you have any questions or concerns, please don't hesitate to send me a message via MyChart or call the office at [phone]. Thank you for visiting with [place] today! It's our pleasure caring for you.

## 2018-07-08 NOTE — Progress Notes (Signed)
Subjective  CC:  Chief Complaint  Patient presents with  . Diabetes  . COPD  . Benign Prostatic Hypertrophy    He reports that the Tamsulison is not working well anymore    HPI: Jason Snyder is a 68 y.o. male who presents to the office today for follow up of diabetes and problems listed above in the chief complaint.   Diabetes follow up: His diabetic control is reported as Unchanged.  He reports his fasting sugars remain well controlled but cannot give me numbers.  He does admit to eating an increased amount of sweets over the holidays.  His weight is up a few pounds as well.  Since he quit smoking in October, he also has been eating more.  He is only on glipizide.  He has been only on this in part due to financial constraints.  He now has Medicare. He denies exertional CP or SOB or symptomatic hypoglycemia. He denies foot sores or paresthesias.  He has Prevnar a few weeks ago.  Former smoker, quit 3 months ago and is doing well.  Uses albuterol inhaler intermittently for presumed COPD.  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  Needs PFTs.  Also would qualify for lung cancer screening with CT scan.  Recommend referral.  BPH with obstructive symptoms, without irritative symptoms.  Had been well controlled on Flomax 0.4 mg daily.  Now causing more problems.  Hypogonadism on testosterone replacement for the last 12 years or so.  Most recent levels high normal.  Energy level is good.  Hypertension hyperlipidemia have been well controlled.  Normal renal function.  Chronic pain, reviewed drug database.  Pain medication use is appropriate.  Pain is well controlled. Wt Readings from Last 3 Encounters:  07/08/18 250 lb 3.2 oz (113.5 kg)  06/12/18 247 lb 9.6 oz (112.3 kg)  04/06/18 243 lb 9.6 oz (110.5 kg)    BP Readings from Last 3 Encounters:  07/08/18 128/82  06/12/18 126/80  04/06/18 132/80    Assessment  1. Controlled type 2 diabetes mellitus without complication,  without long-term current use of insulin ([place])   2. Chronic pain syndrome   3. Chronic obstructive pulmonary disease, unspecified COPD type ([place])   4. Essential hypertension   5. Hypogonadism male   6. OSA on CPAP   7. BPH with urinary obstruction   8. Encounter for screening for lung cancer   9. Smoking greater than 30 pack years      Plan   Diabetes is currently marginally controlled.  He had metformin 500 twice daily and start working on diet again.  Recheck 3 months.  Blood pressures well controlled as are lipids.  On statin.  Well-tolerated.  Chronic former smoker with COPD symptoms: Referral to [facility] pulmonology for PFTs, evaluation and treatment and lung cancer screening.  BPH with worsening symptoms: Increase Flomax 2.8 mg daily and refer to urology for further assessment.  Also would like evaluation for chronic testosterone replacement.  Chronic pain is well controlled  Follow up: No follow-ups on file.. Orders Placed This Encounter  Procedures  . Ambulatory referral to Urology  . Ambulatory referral to Pulmonology  . POCT glycosylated hemoglobin (Hb A1C)  . Pulmonary function test   Meds ordered this encounter  Medications  . tamsulosin (FLOMAX) 0.4 MG CAPS capsule    Sig: Take 2 capsules (0.8 mg total) by mouth daily.    Dispense:  180 capsule    Refill:  3  .  metFORMIN (GLUCOPHAGE) 500 MG tablet    Sig: Take 1 tablet (500 mg total) by mouth 2 (two) times daily with a meal.    Dispense:  180 tablet    Refill:  3      Immunization History  Administered Date(s) Administered  . Hepatitis B, ped/adol 06/04/2003  . Influenza Split 03/03/2010  . Influenza, High Dose Seasonal PF 03/21/2017, 03/03/2018  . Influenza, Seasonal, Injecte, Preservative Fre 03/23/2014, 04/12/2015  . Influenza,inj,Quad PF,6+ Mos 04/01/2016  . Pneumococcal Conjugate-13 06/06/2018  . Pneumococcal Polysaccharide-23 06/04/2003  . Td 06/04/2003  . Tdap 01/10/2015  . Zoster 09/18/2015      Diabetes Related Lab Review: Lab Results  Component Value Date   HGBA1C 7.3 (A) 07/08/2018   HGBA1C 6.5 (A) 04/06/2018   HGBA1C 6.6 10/03/2017    Lab Results  Component Value Date   MICROALBUR <0.7 04/06/2018   Lab Results  Component Value Date   CREATININE 0.95 04/06/2018   BUN 14 04/06/2018   NA 136 04/06/2018   K 4.4 04/06/2018   CL 98 04/06/2018   CO2 26 04/06/2018   Lab Results  Component Value Date   CHOL 149 04/06/2018   Lab Results  Component Value Date   HDL 42.20 04/06/2018   Lab Results  Component Value Date   LDLCALC 80 04/06/2018   [place] 68 04/21/2017   Lab Results  Component Value Date   TRIG 137.0 04/06/2018   Lab Results  Component Value Date   CHOLHDL 4 04/06/2018   No results found for: LDLDIRECT The 10-year ASCVD risk score Mikey Bussing DC Jr., et al., 2013) is: 32.2%   Values used to calculate the score:     Age: 36 years     Sex: Male     Is Non-Hispanic African American: No     Diabetic: Yes     Tobacco smoker: Yes     Systolic Blood Pressure: 623 mmHg     Is BP treated: Yes     HDL Cholesterol: 42.2 mg/dL     Total Cholesterol: 149 mg/dL I have reviewed the PMH, Fam and Soc history. Patient Active Problem List   Diagnosis Date Noted  . Obesity (BMI 30.0-34.9) 10/03/2017  . Nicotine dependence with nicotine-induced disorder 10/03/2017  . Rosacea 10/03/2017  . Smoking greater than 30 pack years 10/03/2017  . BPH without obstruction/lower urinary tract symptoms 10/03/2017  . Gastroesophageal reflux disease 01/10/2015  . Primary osteoarthritis involving multiple joints 01/10/2015  . Controlled type 2 diabetes mellitus without complication, without long-term current use of insulin ([place]) 07/12/2014  . COPD (chronic obstructive pulmonary disease) ([place]) 01/07/2014  . Chronic pain 10/07/2013  . OSA on CPAP 10/07/2013  . 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 Essential hypertension 08/31/2010    Elevated ASCVD risk - started lipitor  04/2013   . 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    Hypogonadism - pineal glad abnormalities On testosterone replacement     Social History: Patient  reports that he quit smoking about 4 months ago. His smoking use included cigarettes. He has a 15.00 pack-year smoking history. He uses smokeless tobacco. He reports previous alcohol use. He reports previous drug use.  Review of Systems: Ophthalmic: negative for eye pain, loss of vision or double vision Cardiovascular: negative for chest pain Respiratory: negative for SOB or persistent cough Gastrointestinal: negative for abdominal pain Genitourinary: negative for dysuria or gross hematuria MSK: negative for foot lesions Neurologic: negative for weakness or gait disturbance  Objective  Vitals: BP 128/82  Pulse 68   Temp 97.8 F (36.6 C) (Oral)   Resp 16   Ht (!) 6" (0.152 m)   Wt 250 lb 3.2 oz (113.5 kg)   SpO2 95%   BMI 4886.38 kg/m  General: well appearing, no acute distress  Psych:  Alert and oriented, normal mood and affect HEENT:  Normocephalic, atraumatic, moist mucous membranes, supple neck  Cardiovascular:  Nl S1 and S2, RRR without murmur, gallop or rub. no edema Respiratory:  Good breath sounds bilaterally, CTAB with normal effort, no rales Skin:  Warm, no rashes Neurologic:   Mental status is normal. normal gait Morning    Diabetic education: ongoing education regarding chronic disease management for diabetes was given today. We continue to reinforce the ABC's of diabetic management: A1c (<7 or 8 dependent upon patient), tight blood pressure control, and cholesterol management with goal LDL < 100 minimally. We discuss diet strategies, exercise recommendations, medication options and possible side effects. At each visit, we review recommended immunizations and preventive care recommendations for diabetics and stress that good diabetic control can prevent other problems. See below for this patient's data.    Commons side  effects, risks, benefits, and alternatives for medications and treatment plan prescribed today were discussed, and the patient expressed understanding of the given instructions. Patient is instructed to call or message via MyChart if he/she has any questions or concerns regarding our treatment plan. No barriers to understanding were identified. We discussed Red Flag symptoms and signs in detail. Patient expressed understanding regarding what to do in case of urgent or emergency type symptoms.   Medication list was reconciled, printed and provided to the patient in AVS. Patient instructions and summary information was reviewed with the patient as documented in the AVS. This note was prepared with assistance of Dragon voice recognition software. Occasional wrong-word or sound-a-like substitutions may have occurred due to the inherent limitations of voice recognition software

## 2018-07-14 ENCOUNTER — Ambulatory Visit (INDEPENDENT_AMBULATORY_CARE_PROVIDER_SITE_OTHER): Payer: Medicare Other | Admitting: Internal Medicine

## 2018-07-14 DIAGNOSIS — J449 Chronic obstructive pulmonary disease, unspecified: Secondary | ICD-10-CM | POA: Diagnosis not present

## 2018-07-14 LAB — PULMONARY FUNCTION TEST
DL/VA % pred: 98 %
DL/VA: 4.04 ml/min/mmHg/L
DLCO unc % pred: 119 %
DLCO unc: 34.06 ml/min/mmHg
FEF 25-75 POST: 2.07 L/s
FEF 25-75 Pre: 1.9 L/sec
FEF2575-%Change-Post: 9 %
FEF2575-%PRED-POST: 71 %
FEF2575-%Pred-Pre: 65 %
FEV1-%Change-Post: 5 %
FEV1-%Pred-Post: 89 %
FEV1-%Pred-Pre: 84 %
FEV1-Post: 3.31 L
FEV1-Pre: 3.14 L
FEV1FVC-%Change-Post: 4 %
FEV1FVC-%PRED-PRE: 87 %
FEV6-%Change-Post: 1 %
FEV6-%Pred-Post: 101 %
FEV6-%Pred-Pre: 99 %
FEV6-Post: 4.76 L
FEV6-Pre: 4.7 L
FEV6FVC-%Change-Post: 0 %
FEV6FVC-%Pred-Post: 102 %
FEV6FVC-%Pred-Pre: 103 %
FVC-%Change-Post: 1 %
FVC-%PRED-POST: 98 %
FVC-%PRED-PRE: 96 %
FVC-Post: 4.86 L
FVC-Pre: 4.79 L
Post FEV1/FVC ratio: 68 %
Post FEV6/FVC ratio: 98 %
Pre FEV1/FVC ratio: 66 %
Pre FEV6/FVC Ratio: 98 %
RV % pred: 166 %
RV: 4.08 L
TLC % pred: 123 %
TLC: 9.16 L

## 2018-07-14 NOTE — Progress Notes (Signed)
PFT done today. 

## 2018-07-15 ENCOUNTER — Encounter: Payer: Self-pay | Admitting: Family Medicine

## 2018-07-15 MED ORDER — TESTOSTERONE CYPIONATE 200 MG/ML IM SOLN: INTRAMUSCULAR | 3 refills | Status: DC

## 2018-07-15 MED ORDER — IPRATROPIUM BROMIDE HFA 17 MCG/ACT IN AERS: 2.0000 | INHALATION_SPRAY | Freq: Two times a day (BID) | RESPIRATORY_TRACT | 11 refills | Status: DC

## 2018-07-22 ENCOUNTER — Telehealth: Payer: Self-pay | Admitting: *Deleted

## 2018-07-22 NOTE — Telephone Encounter (Signed)
Testosterone Cypionate 200mg /mL approved 07/22/2018 - 06/03/2019.

## 2018-07-24 ENCOUNTER — Institutional Professional Consult (permissible substitution): Payer: PRIVATE HEALTH INSURANCE | Admitting: Pulmonary Disease

## 2018-07-30 ENCOUNTER — Encounter: Payer: Self-pay | Admitting: Pulmonary Disease

## 2018-07-30 ENCOUNTER — Ambulatory Visit (INDEPENDENT_AMBULATORY_CARE_PROVIDER_SITE_OTHER): Payer: Medicare Other | Admitting: Pulmonary Disease

## 2018-07-30 VITALS — BP 130/78 | HR 73 | Ht 72.0 in | Wt 255.4 lb

## 2018-07-30 DIAGNOSIS — Z87891 Personal history of nicotine dependence: Secondary | ICD-10-CM

## 2018-07-30 NOTE — Patient Instructions (Signed)
I have reviewed the PFTs which show mild COPD I am glad he has quit smoking which is the best thing you can do to help with this condition Continue the Atrovent inhaler We will refer you for low-dose screening CTs of the chest  Follow-up in 6 months.

## 2018-07-30 NOTE — Progress Notes (Signed)
Jason Snyder    [id number]    11-07-1950  Primary Care Physician:Andy, Karie Fetch, MD  Referring Physician: [name], MD [address] [place] [address] [place], [place] [address]  Chief complaint: Consult for COPD  HPI: 68 year old ex-smoker with history of diabetes, hyperlipidemia, hypertension.  Here for evaluation of COPD. He had recent PFTs that showed mild obstructive airway changes with air trapping Chief complaint is dyspnea on exertion.  He has occasional cough with white mucus.  No wheezing He quit smoking in October 2019 and reports that overall his breathing has improved since then though he is gained some weight.  He was started on Atrovent inhaler in early February by his primary care and he feels this is helping with symptoms as well.  Pets: Dog, no cats, birds, farm animals Occupation: Retired Environmental manager facility Exposures: No known exposures, no mold, hot tub, Jacuzzi Smoking history: 50-75-pack-year smoker.  Quit in October 2019 Travel history: Originally from [place].  He had lived in [place] most of his life.  No recent travel Relevant family history: Father died of lung cancer, he was a smoker but  Outpatient Encounter Medications as of 07/30/2018  Medication Sig  . acetaminophen-codeine (TYLENOL #3) 300-30 MG tablet Take 1 tablet by mouth every 6 (six) hours as needed for moderate pain.  Marland Kitchen albuterol (VENTOLIN HFA) 108 (90 Base) MCG/ACT inhaler INHALE 2 PUFFS BY MOUTH EVERY 4 HOURS AS NEEDED FOR WHEEZING  . amLODipine (NORVASC) 10 MG tablet Take 1 tablet (10 mg total) by mouth daily.  Marland Kitchen aspirin 81 MG EC tablet Take by mouth.  Marland Kitchen atorvastatin (LIPITOR) 10 MG tablet Take 1 tablet (10 mg total) by mouth at bedtime.  . diclofenac sodium (VOLTAREN) 1 % GEL PLACE 2 GRAMS ON TO THE SKIN 4 TIMES A DAY AS DIRECTED  . fluticasone (FLONASE) 50 MCG/ACT nasal spray Place into the nose.  Marland Kitchen glipiZIDE (GLUCOTROL XL) 10 MG 24 hr tablet Take 1 tablet (10  mg total) by mouth daily with breakfast.  . hydrochlorothiazide (HYDRODIURIL) 25 MG tablet Take 1 tablet (25 mg total) by mouth daily.  Marland Kitchen ipratropium (ATROVENT HFA) 17 MCG/ACT inhaler Inhale 2 puffs into the lungs 2 (two) times daily.  . Loratadine (CLARITIN) 10 MG CAPS Take by mouth.  . losartan (COZAAR) 100 MG tablet Take 1 tablet (100 mg total) by mouth daily.  . metFORMIN (GLUCOPHAGE) 500 MG tablet Take 1 tablet (500 mg total) by mouth 2 (two) times daily with a meal.  . metoprolol tartrate (LOPRESSOR) 50 MG tablet TAKE ONE TABLET BY MOUTH TWICE DAILY   . Multiple Vitamin (MULTIVITAMIN) capsule Take by mouth.  . neomycin-polymyxin-hydrocortisone (CORTISPORIN) OTIC solution Place 3 drops into the left ear 4 (four) times daily.  . tamsulosin (FLOMAX) 0.4 MG CAPS capsule Take 2 capsules (0.8 mg total) by mouth daily.  Marland Kitchen testosterone cypionate (DEPOTESTOSTERONE CYPIONATE) 200 MG/ML injection inject 1 ml into the muscle every 14 days   No facility-administered encounter medications on file as of 07/30/2018.     Allergies as of 07/30/2018 - Review Complete 07/30/2018  Allergen Reaction Noted  . Rubus fruticosus Rash 04/21/2017    Past Medical History:  Diagnosis Date  . BMI 30.0-30.9,adult   . Chronic left shoulder pain c  . Chronic low back pain with right-sided sciatica   . DDD (degenerative disc disease), lumbar   . Diabetes mellitus without complication ([name])   . History of lumbar surgery   .  Hyperlipidemia   . Hypertension   . Nicotine dependence, cigarettes, uncomplicated   . Obesity (BMI 30-39.9)   . Rosacea   . Thoracic and lumbosacral neuritis     Past Surgical History:  Procedure Laterality Date  . ARTHROSCOPIC REPAIR ACL    . SHOULDER ARTHROSCOPY    . SPINE SURGERY      Family History  Problem Relation Age of Onset  . Pulmonary embolism Mother   . Heart disease Father   . Lung cancer Father   . Lung cancer Sister   . Heart disease Maternal Uncle     Social  History   Socioeconomic History  . Marital status: Married    Spouse name: Not on file  . Number of children: Not on file  . Years of education: Not on file  . Highest education level: Not on file  Occupational History  . Not on file  Social Needs  . Financial resource strain: Not on file  . Food insecurity:    Worry: Not on file    Inability: Not on file  . Transportation needs:    Medical: Not on file    Non-medical: Not on file  Tobacco Use  . Smoking status: Former Smoker    Packs/day: 0.50    Years: 30.00    Pack years: 15.00    Types: Cigarettes    Last attempt to quit: 03/05/2018    Years since quitting: 0.4  . Smokeless tobacco: Current User  . Tobacco comment: no cigarettes since 03/05/18  Substance and Sexual Activity  . Alcohol use: Not Currently  . Drug use: Not Currently  . Sexual activity: Not Currently  Lifestyle  . Physical activity:    Days per week: Not on file    Minutes per session: Not on file  . Stress: Not on file  Relationships  . Social connections:    Talks on phone: Not on file    Gets together: Not on file    Attends religious service: Not on file    Active member of club or organization: Not on file    Attends meetings of clubs or organizations: Not on file    Relationship status: Not on file  . Intimate partner violence:    Fear of current or ex partner: Not on file    Emotionally abused: Not on file    Physically abused: Not on file    Forced sexual activity: Not on file  Other Topics Concern  . Not on file  Social History Narrative  . Not on file    Review of systems: Review of Systems  Constitutional: Negative for fever and chills.  HENT: Negative.   Eyes: Negative for blurred vision.  Respiratory: as per HPI  Cardiovascular: Negative for chest pain and palpitations.  Gastrointestinal: Negative for vomiting, diarrhea, blood per rectum. Genitourinary: Negative for dysuria, urgency, frequency and hematuria.    Musculoskeletal: Negative for myalgias, back pain and joint pain.  Skin: Negative for itching and rash.  Neurological: Negative for dizziness, tremors, focal weakness, seizures and loss of consciousness.  Endo/Heme/Allergies: Negative for environmental allergies.  Psychiatric/Behavioral: Negative for depression, suicidal ideas and hallucinations.  All other systems reviewed and are negative.  Physical Exam: Blood pressure 130/78, pulse 73, height 6' (1.829 m), weight 255 lb 6.4 oz (115.8 kg), SpO2 97 %. Gen:      No acute distress HEENT:  EOMI, sclera anicteric Neck:     No masses; no thyromegaly Lungs:    Clear  to auscultation bilaterally; normal respiratory effort CV:         Regular rate and rhythm; no murmurs Abd:      + bowel sounds; soft, non-tender; no palpable masses, no distension Ext:    No edema; adequate peripheral perfusion Skin:      Warm and dry; no rash Neuro: alert and oriented x 3 Psych: normal mood and affect  Data Reviewed: PFTs: 07/14/2018 FVC 4.86 [98%], FEV1 3.31 [89%], F/F 68, TLC 123%, DLCO 111% Mild obstruction with overinflation and air trapping.  Labs: CBC 04/06/2018-WBC 7.8, eos 2.1%, absolute eosinophil count 164  Assessment:  Mild COPD Has minimal symptoms of dyspnea on exertion which are well controlled on Atrovent inhaler.  He will continue this for now.  No need for step up at present Advised him to work on weight loss which will help with breathing  Ex-smoker Refer for low-dose screening CT of the chest  Health maintenance 03/03/2018-influenza 06/06/2018-Prevnar 06/04/2003-Pneumovax   Plan/Recommendations: - Continue Atrovent - Screening CT of the chest.  Marshell Garfinkel MD [facility] 07/30/2018, 9:25 AM  CC: Jason Arnt, MD

## 2018-08-03 ENCOUNTER — Other Ambulatory Visit: Payer: Self-pay | Admitting: Acute Care

## 2018-08-03 DIAGNOSIS — Z122 Encounter for screening for malignant neoplasm of respiratory organs: Secondary | ICD-10-CM

## 2018-08-03 DIAGNOSIS — Z87891 Personal history of nicotine dependence: Secondary | ICD-10-CM

## 2018-08-12 ENCOUNTER — Other Ambulatory Visit: Payer: Self-pay | Admitting: Family Medicine

## 2018-08-13 MED ORDER — ACETAMINOPHEN-CODEINE #3 300-30 MG PO TABS: 1.0000 | ORAL_TABLET | Freq: Four times a day (QID) | ORAL | 0 refills | Status: DC | PRN

## 2018-08-18 DIAGNOSIS — N401 Enlarged prostate with lower urinary tract symptoms: Secondary | ICD-10-CM | POA: Diagnosis not present

## 2018-08-18 DIAGNOSIS — E230 Hypopituitarism: Secondary | ICD-10-CM | POA: Diagnosis not present

## 2018-08-18 DIAGNOSIS — R3914 Feeling of incomplete bladder emptying: Secondary | ICD-10-CM | POA: Diagnosis not present

## 2018-09-02 ENCOUNTER — Encounter: Payer: PRIVATE HEALTH INSURANCE | Admitting: Acute Care

## 2018-09-02 ENCOUNTER — Inpatient Hospital Stay: Admission: RE | Admit: 2018-09-02 | Payer: Medicare Other | Source: Ambulatory Visit

## 2018-10-08 ENCOUNTER — Other Ambulatory Visit: Payer: Self-pay

## 2018-10-08 ENCOUNTER — Encounter: Payer: Self-pay | Admitting: Family Medicine

## 2018-10-08 ENCOUNTER — Ambulatory Visit (INDEPENDENT_AMBULATORY_CARE_PROVIDER_SITE_OTHER): Payer: Medicare Other | Admitting: Family Medicine

## 2018-10-08 VITALS — BP 132/80 | Wt 253.0 lb

## 2018-10-08 DIAGNOSIS — I10 Essential (primary) hypertension: Secondary | ICD-10-CM | POA: Diagnosis not present

## 2018-10-08 DIAGNOSIS — N400 Enlarged prostate without lower urinary tract symptoms: Secondary | ICD-10-CM | POA: Diagnosis not present

## 2018-10-08 DIAGNOSIS — E119 Type 2 diabetes mellitus without complications: Secondary | ICD-10-CM | POA: Diagnosis not present

## 2018-10-08 DIAGNOSIS — G4733 Obstructive sleep apnea (adult) (pediatric): Secondary | ICD-10-CM | POA: Diagnosis not present

## 2018-10-08 DIAGNOSIS — J449 Chronic obstructive pulmonary disease, unspecified: Secondary | ICD-10-CM | POA: Diagnosis not present

## 2018-10-08 DIAGNOSIS — Z9989 Dependence on other enabling machines and devices: Secondary | ICD-10-CM | POA: Diagnosis not present

## 2018-10-08 DIAGNOSIS — G894 Chronic pain syndrome: Secondary | ICD-10-CM | POA: Diagnosis not present

## 2018-10-08 NOTE — Assessment & Plan Note (Signed)
Now seeing urology; on flomax and proscar.

## 2018-10-08 NOTE — Assessment & Plan Note (Signed)
This medical condition is well controlled. There are no signs of complications, medication side effects, or red flags. Patient is instructed to continue the current treatment plan without change in therapies or medications.   

## 2018-10-08 NOTE — Patient Instructions (Signed)
Please return in 3 months for diabetes follow up   If you have any questions or concerns, please don't hesitate to send me a message via MyChart or call the office at [phone]. Thank you for visiting with [place] today! It's our pleasure caring for you.

## 2018-10-08 NOTE — Assessment & Plan Note (Signed)
Will bring pt in for a1c to see if controlled is improved. Discussed possible allergy to metformin although it sounds atypical. He will hold med for 2 weeks to see if resolves. Also can try zyrtec or allegra as it could be due to seasonal allergy. No red flags present. Will adjust diabetes meds if needed. Other diabetes needs are up to date.

## 2018-10-08 NOTE — Progress Notes (Signed)
Virtual Visit via Video Note  Subjective  CC:  Chief Complaint  Patient presents with  . Diabetes    He reports that he did start taking Metformin BID in addition to Glipizide  . Hypertension  . Hyperlipidemia     I connected with [name] on 10/08/18 at  8:40 AM EDT by a video enabled telemedicine application and verified that I am speaking with the correct person using two identifiers. Location patient: Home Location provider: [facility] [place] participating in the virtual visit: [place] Gandolfo, [name], MD Lilli Light, [place] discussed the limitations of evaluation and management by telemedicine and the availability of in person appointments. The patient expressed understanding and agreed to proceed. HPI: Jason Snyder is a 68 y.o. male who was contacted today to address the problems listed above in the chief complaint, f/u diabetes:  Diabetes follow up: His diabetic control is reported as clinicall controlled. added low dose metformin 3 months ago. He believes he may be having an allergic reaction to it. Feels tight in his throat at times w/o sob or wheeze, denies cp or rash. Benadryl at night helps. He is unaware of allergy sxs: rhinorrhea or pnd. No cough f/c/s.  He denies exertional CP or SOB or symptomatic hypoglycemia. He denies foot sores or paresthesias.  HTN: has been well controlled on ARB.   Copd and osa on cpap: reviewed pulm notes. And recent pfts. Mild disease doing well on atrovent. Stopped smoking in October and continues to do well with that. Chest ct for cancer screening has been postponed due to covid 19.   Urology added proscar for bph. Records not available for review.   Immunization History  Administered Date(s) Administered  . Hepatitis B, ped/adol 06/04/2003  . Influenza Split 03/03/2010  . Influenza, High Dose Seasonal PF 03/21/2017, 03/03/2018  . Influenza, Seasonal, Injecte, Preservative Fre 03/23/2014,  04/12/2015  . Influenza,inj,Quad PF,6+ Mos 04/01/2016  . Pneumococcal Conjugate-13 06/06/2018  . Pneumococcal Polysaccharide-23 06/04/2003  . Td 06/04/2003  . Tdap 01/10/2015  . Zoster 09/18/2015    Diabetes Related Lab Review: Lab Results  Component Value Date   HGBA1C 7.3 (A) 07/08/2018   HGBA1C 6.5 (A) 04/06/2018   HGBA1C 6.6 10/03/2017    Lab Results  Component Value Date   MICROALBUR <0.7 04/06/2018   Lab Results  Component Value Date   CREATININE 0.95 04/06/2018   BUN 14 04/06/2018   NA 136 04/06/2018   K 4.4 04/06/2018   CL 98 04/06/2018   CO2 26 04/06/2018   Lab Results  Component Value Date   CHOL 149 04/06/2018   Lab Results  Component Value Date   HDL 42.20 04/06/2018   Lab Results  Component Value Date   LDLCALC 80 04/06/2018   [place] 68 04/21/2017   Lab Results  Component Value Date   TRIG 137.0 04/06/2018   Lab Results  Component Value Date   CHOLHDL 4 04/06/2018   No results found for: LDLDIRECT The 10-year ASCVD risk score Mikey Bussing DC Jr., et al., 2013) is: 33.7%   Values used to calculate the score:     Age: 72 years     Sex: Male     Is Non-Hispanic African American: No     Diabetic: Yes     Tobacco smoker: Yes     Systolic Blood Pressure: 786 mmHg     Is BP treated: Yes     HDL Cholesterol: 42.2  mg/dL     Total Cholesterol: 149 mg/dL  BP Readings from Last 3 Encounters:  10/08/18 132/80  07/30/18 130/78  07/08/18 128/82   Wt Readings from Last 3 Encounters:  10/08/18 253 lb (114.8 kg)  07/30/18 255 lb 6.4 oz (115.8 kg)  07/08/18 250 lb 3.2 oz (113.5 kg)    Health Maintenance  Topic Date Due  . FOOT EXAM  10/04/2018  . INFLUENZA VACCINE  01/02/2019  . HEMOGLOBIN A1C  01/06/2019  . OPHTHALMOLOGY EXAM  03/05/2019  . PNA vac Low Risk Adult (2 of 2 - PPSV23) 06/07/2019  . Fecal DNA (Cologuard)  06/25/2021  . TETANUS/TDAP  01/09/2025  . Hepatitis C Screening  Completed  . HIV Screening  Discontinued    Assessment  1.  Controlled type 2 diabetes mellitus without complication, without long-term current use of insulin ([place])   2. Essential hypertension   3. Chronic obstructive pulmonary disease, unspecified COPD type ([place])   4. OSA on CPAP   5. Chronic pain syndrome   6. BPH without obstruction/lower urinary tract symptoms      Plan   See below for problem based assessment and plan documentation  Diabetic education: ongoing education regarding chronic disease management for diabetes was given today. We continue to reinforce the ABC's of diabetic management: A1c (<7 or 8 dependent upon patient), tight blood pressure control, and cholesterol management with goal LDL < 100 minimally. We discuss diet strategies, exercise recommendations, medication options and possible side effects. At each visit, we review recommended immunizations and preventive care recommendations for diabetics and stress that good diabetic control can prevent other problems. See below for this patient's data.  I discussed the assessment and treatment plan with the patient. The patient was provided an opportunity to ask questions and all were answered. The patient agreed with the plan and demonstrated an understanding of the instructions.   The patient was advised to call back or seek an in-person evaluation if the symptoms worsen or if the condition fails to improve as anticipated. Follow up: Return in about 3 months (around 01/08/2019) for follow up of diabetes and hypertension.  Visit date not found  No orders of the defined types were placed in this encounter.     I reviewed the patients updated PMH, FH, and SocHx.    Patient Active Problem List   Diagnosis Date Noted  . Smoking greater than 30 pack years - quit 03/2018 10/03/2017    Priority: High  . Controlled type 2 diabetes mellitus without complication, without long-term current use of insulin ([employer]) 07/12/2014    Priority: High  . COPD (chronic obstructive pulmonary disease)  ([place]) 01/07/2014    Priority: High  . Chronic pain 10/07/2013    Priority: High  . OSA on CPAP 10/07/2013    Priority: High  . Essential hypertension 08/31/2010    Priority: High  . Obesity (BMI 30.0-34.9) 10/03/2017    Priority: Medium  . BPH without obstruction/lower urinary tract symptoms 10/03/2017    Priority: Medium  . Gastroesophageal reflux disease 01/10/2015    Priority: Medium  . Primary osteoarthritis involving multiple joints 01/10/2015    Priority: Medium  . 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    Priority: Medium  . Rosacea 10/03/2017    Priority: Low  . 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    Priority: Low   Current Meds  Medication Sig  . acetaminophen-codeine (TYLENOL #3) 300-30 MG tablet Take 1 tablet by mouth every 6 (six) hours as needed for moderate  pain.  . albuterol (VENTOLIN HFA) 108 (90 Base) MCG/ACT inhaler INHALE 2 PUFFS BY MOUTH EVERY 4 HOURS AS NEEDED FOR WHEEZING  . amLODipine (NORVASC) 10 MG tablet Take 1 tablet (10 mg total) by mouth daily.  Marland Kitchen aspirin 81 MG EC tablet Take by mouth.  Marland Kitchen atorvastatin (LIPITOR) 10 MG tablet Take 1 tablet (10 mg total) by mouth at bedtime.  . diclofenac sodium (VOLTAREN) 1 % GEL PLACE 2 GRAMS ON TO THE SKIN 4 TIMES A DAY AS DIRECTED  . finasteride (PROSCAR) 5 MG tablet   . fluticasone (FLONASE) 50 MCG/ACT nasal spray Place into the nose.  Marland Kitchen glipiZIDE (GLUCOTROL XL) 10 MG 24 hr tablet Take 1 tablet (10 mg total) by mouth daily with breakfast.  . hydrochlorothiazide (HYDRODIURIL) 25 MG tablet Take 1 tablet (25 mg total) by mouth daily.  Marland Kitchen ipratropium (ATROVENT HFA) 17 MCG/ACT inhaler Inhale 2 puffs into the lungs 2 (two) times daily.  . Loratadine (CLARITIN) 10 MG CAPS Take by mouth.  . losartan (COZAAR) 100 MG tablet Take 1 tablet (100 mg total) by mouth daily.  . metFORMIN (GLUCOPHAGE) 500 MG tablet Take 1 tablet (500 mg total) by mouth 2 (two) times daily with a meal.  . metoprolol tartrate (LOPRESSOR) 50 MG tablet TAKE ONE  TABLET BY MOUTH TWICE DAILY   . Multiple Vitamin (MULTIVITAMIN) capsule Take by mouth.  . neomycin-polymyxin-hydrocortisone (CORTISPORIN) OTIC solution Place 3 drops into the left ear 4 (four) times daily.  . tamsulosin (FLOMAX) 0.4 MG CAPS capsule Take 2 capsules (0.8 mg total) by mouth daily.  Marland Kitchen testosterone cypionate (DEPOTESTOSTERONE CYPIONATE) 200 MG/ML injection inject 1 ml into the muscle every 14 days    Allergies: Patient is allergic to rubus fruticosus. Family History: Patient family history includes Heart disease in his father and maternal uncle; Lung cancer in his father and sister; Pulmonary embolism in his mother. Social History:  Patient  reports that he quit smoking about 7 months ago. His smoking use included cigarettes. He has a 15.00 pack-year smoking history. He uses smokeless tobacco. He reports previous alcohol use. He reports previous drug use.  Review of Systems: Constitutional: Negative for fever malaise or anorexia Cardiovascular: negative for chest pain Respiratory: negative for SOB or persistent cough Gastrointestinal: negative for abdominal pain  OBJECTIVE Vitals: BP 132/80   Wt 253 lb (114.8 kg)   BMI 34.31 kg/m  General: no acute distress , A&Ox3  Interactive audio and video telecommunications were attempted between this provider and patient, however failed, due to patient having technical difficulties OR patient did not have access to video capability.  We continued and completed visit with audio only.   [name], MD

## 2018-10-08 NOTE — Assessment & Plan Note (Signed)
Stable per pulm

## 2018-10-08 NOTE — Progress Notes (Signed)
Patient is scheduled for Monday at 10

## 2018-10-08 NOTE — Assessment & Plan Note (Signed)
Stable on meds. Reviewed drug data base. Appropriate.

## 2018-10-08 NOTE — Assessment & Plan Note (Signed)
Doing well on atrovent. Fortunately has minimal disease by pfts. Awaiting chest ct for lung cancer screen.

## 2018-10-09 ENCOUNTER — Other Ambulatory Visit: Payer: Self-pay | Admitting: Family Medicine

## 2018-10-09 MED ORDER — ACETAMINOPHEN-CODEINE #3 300-30 MG PO TABS: 1.0000 | ORAL_TABLET | Freq: Four times a day (QID) | ORAL | 0 refills | Status: DC | PRN

## 2018-10-09 NOTE — Telephone Encounter (Signed)
Please advise 

## 2018-10-12 ENCOUNTER — Encounter: Payer: Self-pay | Admitting: Family Medicine

## 2018-10-12 ENCOUNTER — Ambulatory Visit (INDEPENDENT_AMBULATORY_CARE_PROVIDER_SITE_OTHER): Payer: Medicare Other | Admitting: Family Medicine

## 2018-10-12 ENCOUNTER — Other Ambulatory Visit: Payer: Self-pay

## 2018-10-12 VITALS — BP 138/84

## 2018-10-12 DIAGNOSIS — I10 Essential (primary) hypertension: Secondary | ICD-10-CM | POA: Diagnosis not present

## 2018-10-12 DIAGNOSIS — E119 Type 2 diabetes mellitus without complications: Secondary | ICD-10-CM | POA: Diagnosis not present

## 2018-10-12 LAB — POCT GLYCOSYLATED HEMOGLOBIN (HGB A1C): Hemoglobin A1C: 6.5 % — AB (ref 4.0–5.6)

## 2018-10-12 NOTE — Progress Notes (Signed)
Jason Snyder is a 68 y.o. male presents to the office today for Blood pressure check and A1c.  Blood pressure medication: Amlodipine 10 mg once daily, HTCZ 25 mg once daily, Losartan 100 mg once daily, and Metoprolol 50 mg twice daily. Blood pressure was taken in the right arm after patient rested for 5 minutes.  BP 138/84   His A1c today was 6.5.  He was instructed to make no changes and follow up in 3 months.   Layla Barter

## 2018-10-16 ENCOUNTER — Other Ambulatory Visit: Payer: Self-pay | Admitting: *Deleted

## 2018-10-16 MED ORDER — ALBUTEROL SULFATE HFA 108 (90 BASE) MCG/ACT IN AERS: INHALATION_SPRAY | RESPIRATORY_TRACT | 3 refills | Status: DC

## 2018-10-30 ENCOUNTER — Telehealth: Payer: Self-pay | Admitting: *Deleted

## 2018-10-30 NOTE — Telephone Encounter (Signed)
Script Screening patients for COVID-19 and reviewing new operational procedures  Greeting - The reason I am calling is to share with you some new changes to our processes that are designed to help us keep everyone safe. Is now a good time to speak with you? Patient says "no' - ask them when you can call back and let them know it's important to do this prior to their appointment.  Patient says "yes" - Great, Jason Snyder the first thing I need to do is ask you some screening Questions.  1. To the best of your knowledge, have you been in close contact with any one with a confirmed diagnosis of COVID 19? o No - proceed to next question   2. Have you had any one or more of the following: fever, chills, cough, shortness of breath or any flu-like symptoms? o No - proceed to next question  3. Have you been diagnosed with or have a previous diagnosis of COVID 19? o No - proceed to next question  4. I am going to go over a few other symptoms with you. Please let me know if you are experiencing any of the following: . Ear, nose or throat discomfort . A sore throat . Headache . Muscle pain . Diarrhea . Loss of taste or smell o No - proceed to next question  Thank you for answering these questions. Please know we will ask you these questions or similar questions when you arrive for your appointment and again it's how we are keeping everyone safe. Also, to keep you safe, please use the provided hand sanitizer when you enter the building. [name], we are asking everyone in the building to wear a mask because they help us prevent the spread of germs. Do you have a mask of your own, if not, we are happy to provide one for you. The last thing I want to go over with you is the no visitor guidelines. This means no one can attend the appointment with you unless you need physical assistance. I understand this may be different from your past appointments and I know this may be difficult but please know if  someone is driving you we are happy to call them for you once your appointment is over.  [INSERT SITE SPECIFIC CHECK IN PROCEDURES]  [name] I've given you a lot of information, what questions do you have about what I've talked about today or your appointment tomorrow? 

## 2018-11-02 ENCOUNTER — Ambulatory Visit (INDEPENDENT_AMBULATORY_CARE_PROVIDER_SITE_OTHER)
Admission: RE | Admit: 2018-11-02 | Discharge: 2018-11-02 | Disposition: A | Discharge status: Home / Self Care | Payer: Medicare Other | Source: Ambulatory Visit | Attending: Acute Care | Admitting: Acute Care

## 2018-11-02 ENCOUNTER — Other Ambulatory Visit: Payer: Self-pay

## 2018-11-02 ENCOUNTER — Ambulatory Visit (INDEPENDENT_AMBULATORY_CARE_PROVIDER_SITE_OTHER): Payer: Medicare Other | Admitting: Acute Care

## 2018-11-02 ENCOUNTER — Encounter: Payer: Self-pay | Admitting: Acute Care

## 2018-11-02 VITALS — BP 128/78 | HR 71

## 2018-11-02 DIAGNOSIS — F17210 Nicotine dependence, cigarettes, uncomplicated: Secondary | ICD-10-CM

## 2018-11-02 DIAGNOSIS — Z87891 Personal history of nicotine dependence: Secondary | ICD-10-CM | POA: Diagnosis not present

## 2018-11-02 DIAGNOSIS — Z122 Encounter for screening for malignant neoplasm of respiratory organs: Secondary | ICD-10-CM

## 2018-11-02 NOTE — Progress Notes (Addendum)
Shared Decision Making Visit Lung Cancer Screening Program [phone])   Eligibility:  Age 68 y.o.  Pack Years Smoking History Calculation 60 pack year smoking history (# packs/per year x # years smoked)  Recent History of coughing up blood  no  Unexplained weight loss? no ( >Than 15 pounds within the last 6 months )  Prior History Lung / other cancer no (Diagnosis within the last 5 years already requiring surveillance chest CT Scans).  Smoking Status Former Smoker  Former Smokers: Years since quit: < 1 year  Quit Date: 03/2018  Visit Components:  Discussion included one or more decision making aids. yes  Discussion included risk/benefits of screening. yes  Discussion included potential follow up diagnostic testing for abnormal scans. yes  Discussion included meaning and risk of over diagnosis. yes  Discussion included meaning and risk of False Positives. yes  Discussion included meaning of total radiation exposure. yes  Counseling Included:  Importance of adherence to annual lung cancer LDCT screening. yes  Impact of comorbidities on ability to participate in the program. yes  Ability and willingness to under diagnostic treatment. yes  Smoking Cessation Counseling:  Current Smokers:   Discussed importance of smoking cessation. NA, former smoker  Information about tobacco cessation classes and interventions provided to patient. yes  Patient provided with "ticket" for LDCT Scan. yes  Symptomatic Patient. no  CounselingNA  Diagnosis Code: Tobacco Use Z72.0  Asymptomatic Patient yes  Counseling (Intermediate counseling: > three minutes counseling) M3536  Former Smokers:   Discussed the importance of maintaining cigarette abstinence. yes  Diagnosis Code: Personal History of Nicotine Dependence. R44.315  Information about tobacco cessation classes and interventions provided to patient. Yes  Patient provided with "ticket" for LDCT Scan. yes  Written  Order for Lung Cancer Screening with LDCT placed in Epic. Yes (CT Chest Lung Cancer Screening Low Dose W/O CM) [id number] Z12.2-Screening of respiratory organs Z87.891-Personal history of nicotine dependence  BP 128/78 (BP Location: Left Arm, Cuff Size: Normal)   Pulse 71   SpO2 95%    I spent 25 minutes of face to face time with Mr. Korber discussing the risks and benefits of lung cancer screening. We viewed a power point together that explained in detail the above noted topics. We took the time to pause the power point at intervals to allow for questions to be asked and answered to ensure understanding. We discussed that he had taken the single most powerful action possible to decrease his risk of developing lung cancer when he quit smoking. I counseled him to remain smoke free, and to contact me if he ever had the desire to smoke again so that I can provide resources and tools to help support the effort to remain smoke free. We discussed the time and location of the scan, and that either  Doroteo Glassman RN or I will call with the results within  24-48 hours of receiving them. He has my card and contact information in the event he needs to speak with me, in addition to a copy of the power point we reviewed as a resource. He verbalized understanding of all of the above and had no further questions upon leaving the office.   I explained to the patient that there has been a high incidence of coronary artery disease noted on these exams. I explained that this is a non-gated exam therefore degree or severity cannot be determined. This patient is  Currently on statin therapy. I have asked the patient to follow-up  with their PCP regarding any incidental finding of coronary artery disease and management with diet or medication as they feel is clinically indicated. The patient verbalized understanding of the above and had no further questions.     Magdalen Spatz, NP  11/02/2018 2:39 PM

## 2018-11-06 ENCOUNTER — Other Ambulatory Visit: Payer: Self-pay | Admitting: Acute Care

## 2018-11-06 DIAGNOSIS — Z122 Encounter for screening for malignant neoplasm of respiratory organs: Secondary | ICD-10-CM

## 2018-11-06 DIAGNOSIS — Z87891 Personal history of nicotine dependence: Secondary | ICD-10-CM

## 2018-11-09 ENCOUNTER — Encounter: Payer: Self-pay | Admitting: Family Medicine

## 2018-11-09 DIAGNOSIS — Z122 Encounter for screening for malignant neoplasm of respiratory organs: Secondary | ICD-10-CM

## 2018-11-09 HISTORY — DX: Encounter for screening for malignant neoplasm of respiratory organs: Z12.2

## 2018-11-11 ENCOUNTER — Ambulatory Visit (INDEPENDENT_AMBULATORY_CARE_PROVIDER_SITE_OTHER): Payer: Medicare Other | Admitting: Pulmonary Disease

## 2018-11-11 ENCOUNTER — Other Ambulatory Visit: Payer: Self-pay

## 2018-11-11 ENCOUNTER — Encounter: Payer: Self-pay | Admitting: Pulmonary Disease

## 2018-11-11 VITALS — BP 138/78 | HR 67 | Temp 97.9°F | Ht 74.0 in | Wt 264.0 lb

## 2018-11-11 DIAGNOSIS — J449 Chronic obstructive pulmonary disease, unspecified: Secondary | ICD-10-CM

## 2018-11-11 MED ORDER — TIOTROPIUM BROMIDE-OLODATEROL 2.5-2.5 MCG/ACT IN AERS: 2.0000 | INHALATION_SPRAY | Freq: Every day | RESPIRATORY_TRACT | 0 refills | Status: DC

## 2018-11-11 MED ORDER — TIOTROPIUM BROMIDE-OLODATEROL 2.5-2.5 MCG/ACT IN AERS: 2.0000 | INHALATION_SPRAY | Freq: Every day | RESPIRATORY_TRACT | 5 refills | Status: DC

## 2018-11-11 NOTE — Patient Instructions (Signed)
We will start you on a medication called Stiolto.  Use this instead of Atrovent Continue to monitor your symptoms  If the symptoms persist in spite of the new inhaler then check with your primary care doctor about getting your heart evaluated We will also review your CPAP download make sure you are getting optimal pressures  Follow-up in 3 months.

## 2018-11-11 NOTE — Progress Notes (Signed)
Jason Snyder    [id number]    Mar 12, 1951  Primary Care Physician:Andy, Karie Fetch, MD  Referring Physician: [name], [address] [place], [place] [address]  Chief complaint: Consult for COPD, OSA  HPI: 68 year old ex-smoker with history of diabetes, hyperlipidemia, hypertension.  Here for evaluation of COPD. He had recent PFTs that showed mild obstructive airway changes with air trapping Chief complaint is dyspnea on exertion.  He has occasional cough with white mucus.  No wheezing He quit smoking in October 2019 and reports that overall his breathing has improved since then though he is gained some weight.  He was started on Atrovent inhaler in early February by his primary care and he feels this is helping with symptoms as well.  Pets: Dog, no cats, birds, farm animals Occupation: Retired Environmental manager facility Exposures: No known exposures, no mold, hot tub, Jacuzzi Smoking history: 50-75-pack-year smoker.  Quit in October 2019 Travel history: Originally from [place].  He had lived in [place] most of his life.  No recent travel Relevant family history: Father died of lung cancer, he was a smoker  Interim history: C/O of intermittent chest pressure, tightness with throat tightening since February.  Metformin held due to possible side effect however his symptoms continue. He is using Atrovent intermittently.  Denies any dyspnea, wheezing  Outpatient Encounter Medications as of 11/11/2018  Medication Sig  . acetaminophen-codeine (TYLENOL #3) 300-30 MG tablet Take 1 tablet by mouth every 6 (six) hours as needed for moderate pain.  Marland Kitchen albuterol (VENTOLIN HFA) 108 (90 Base) MCG/ACT inhaler INHALE 2 PUFFS BY MOUTH EVERY 4 HOURS AS NEEDED FOR WHEEZING  . amLODipine (NORVASC) 10 MG tablet Take 1 tablet (10 mg total) by mouth daily.  Marland Kitchen aspirin 81 MG EC tablet Take by mouth.  Marland Kitchen atorvastatin (LIPITOR) 10 MG tablet Take 1 tablet (10 mg  total) by mouth at bedtime.  . diclofenac sodium (VOLTAREN) 1 % GEL PLACE 2 GRAMS ON TO THE SKIN 4 TIMES A DAY AS DIRECTED  . finasteride (PROSCAR) 5 MG tablet   . fluticasone (FLONASE) 50 MCG/ACT nasal spray Place into the nose.  Marland Kitchen glipiZIDE (GLUCOTROL XL) 10 MG 24 hr tablet Take 1 tablet (10 mg total) by mouth daily with breakfast.  . hydrochlorothiazide (HYDRODIURIL) 25 MG tablet Take 1 tablet (25 mg total) by mouth daily.  . Loratadine (CLARITIN) 10 MG CAPS Take by mouth.  . losartan (COZAAR) 100 MG tablet Take 1 tablet (100 mg total) by mouth daily.  . metFORMIN (GLUCOPHAGE) 500 MG tablet Take 1 tablet (500 mg total) by mouth 2 (two) times daily with a meal.  . metoprolol tartrate (LOPRESSOR) 50 MG tablet TAKE ONE TABLET BY MOUTH TWICE DAILY   . Multiple Vitamin (MULTIVITAMIN) capsule Take by mouth.  . neomycin-polymyxin-hydrocortisone (CORTISPORIN) OTIC solution Place 3 drops into the left ear 4 (four) times daily.  . tamsulosin (FLOMAX) 0.4 MG CAPS capsule Take 2 capsules (0.8 mg total) by mouth daily.  Marland Kitchen testosterone cypionate (DEPOTESTOSTERONE CYPIONATE) 200 MG/ML injection inject 1 ml into the muscle every 14 days  . ipratropium (ATROVENT HFA) 17 MCG/ACT inhaler Inhale 2 puffs into the lungs 2 (two) times daily. (Patient not taking: Reported on 11/11/2018)   No facility-administered encounter medications on file as of 11/11/2018.    Physical Exam: Blood pressure 138/78, pulse 67, temperature 97.9 F (36.6 C), temperature source Oral, height 6\' 2"  (1.88 m), weight 264 lb (  119.7 kg), SpO2 96 %. Gen:      No acute distress HEENT:  EOMI, sclera anicteric Neck:     No masses; no thyromegaly Lungs:    Clear to auscultation bilaterally; normal respiratory effort CV:         Regular rate and rhythm; no murmurs Abd:      + bowel sounds; soft, non-tender; no palpable masses, no distension Ext:    No edema; adequate peripheral perfusion Skin:      Warm and dry; no rash Neuro: alert and  oriented x 3 Psych: normal mood and affect  Data Reviewed: Imaging: Small calcified pulmonary nodules.  Mild emphysematous changes aortic atherosclerosis, coronary artery atherosclerosis with calcified plaque in the left main, left anterior descending, left circumflex and right coronary arteries.  I have reviewed the images personally.  PFTs: 07/14/2018 FVC 4.86 [98%], FEV1 3.31 [89%], F/F 68, TLC 123%, DLCO 111% Mild obstruction with overinflation and air trapping.  Labs: CBC 04/06/2018-WBC 7.8, eos 2.1%, absolute eosinophil count 164  Assessment:  Mild COPD Complains of worsening symptoms of chest pressure, tightness Although he has mild COPD since the rest symptoms we will try him on Stiolto inhaler Advised him to work on weight loss which will help with breathing  Abnormal CT chest Screening CT chest reviewed with pulmonary nodules which are likely benign.  Continue annual follow-up He does have coronary and aortic atherosclerosis.  Given symptoms of chest pressure he may need a cardiac evaluation.  He prefers to follow-up with his primary care regarding this.  OSA Stable on CPAP We will get download of CPAP for review.  Health maintenance 03/03/2018-influenza 06/06/2018-Prevnar 06/04/2003-Pneumovax  Plan/Recommendations: - Stop Atrovent.  Start Stiolto - Continue CPAP.  Review download.  Marshell Garfinkel MD [facility] 11/11/2018, 9:19 AM  CC: [name], MD

## 2018-11-17 ENCOUNTER — Telehealth: Payer: Self-pay | Admitting: Pulmonary Disease

## 2018-11-17 NOTE — Telephone Encounter (Signed)
Medication name and strength: Stiolto Respimat 2.25mcg Provider: [facility] Pharmacy: [facility] Patient insurance ID: [id number] Phone: [phone] Fax: [phone]  Was the PA started on CMM?  yes If yes, please enter the Key: [id number] Timeframe for approval/denial: awaiting response from covermymeds or via fax as it has not said how long it would take for a determination

## 2018-11-19 MED ORDER — BEVESPI AEROSPHERE 9-4.8 MCG/ACT IN AERO: 2.0000 | INHALATION_SPRAY | Freq: Two times a day (BID) | RESPIRATORY_TRACT | 5 refills | Status: DC

## 2018-11-19 NOTE — Telephone Encounter (Signed)
[name]  We can try bevespi inhaler which has similar ingredients to stiolto.  Marshell Garfinkel MD [facility] 11/19/2018, 12:54 PM

## 2018-11-19 NOTE — Telephone Encounter (Signed)
Dr. Mannam - please advise. Thanks. 

## 2018-11-25 ENCOUNTER — Encounter: Payer: Self-pay | Admitting: Family Medicine

## 2018-11-25 ENCOUNTER — Ambulatory Visit (INDEPENDENT_AMBULATORY_CARE_PROVIDER_SITE_OTHER): Payer: Medicare Other | Admitting: Family Medicine

## 2018-11-25 ENCOUNTER — Ambulatory Visit (INDEPENDENT_AMBULATORY_CARE_PROVIDER_SITE_OTHER): Payer: Medicare Other

## 2018-11-25 ENCOUNTER — Other Ambulatory Visit: Payer: Self-pay

## 2018-11-25 VITALS — BP 142/88 | HR 65 | Temp 98.1°F | Resp 16 | Ht 72.0 in | Wt 257.2 lb

## 2018-11-25 DIAGNOSIS — R9431 Abnormal electrocardiogram [ECG] [EKG]: Secondary | ICD-10-CM | POA: Diagnosis not present

## 2018-11-25 DIAGNOSIS — I10 Essential (primary) hypertension: Secondary | ICD-10-CM | POA: Diagnosis not present

## 2018-11-25 DIAGNOSIS — M7582 Other shoulder lesions, left shoulder: Secondary | ICD-10-CM | POA: Diagnosis not present

## 2018-11-25 DIAGNOSIS — M150 Primary generalized (osteo)arthritis: Secondary | ICD-10-CM

## 2018-11-25 DIAGNOSIS — J449 Chronic obstructive pulmonary disease, unspecified: Secondary | ICD-10-CM

## 2018-11-25 DIAGNOSIS — M159 Polyosteoarthritis, unspecified: Secondary | ICD-10-CM

## 2018-11-25 DIAGNOSIS — R0789 Other chest pain: Secondary | ICD-10-CM

## 2018-11-25 MED ORDER — CYCLOBENZAPRINE HCL 10 MG PO TABS: 10.0000 mg | ORAL_TABLET | Freq: Three times a day (TID) | ORAL | 0 refills | Status: DC | PRN

## 2018-11-25 MED ORDER — PREDNISONE 10 MG PO TABS: ORAL_TABLET | ORAL | 0 refills | Status: DC

## 2018-11-25 NOTE — Patient Instructions (Signed)
Please follow up as scheduled for your next visit with me: 01/12/2019   If you have any questions or concerns, please don't hesitate to send me a message via MyChart or call the office at [phone]. Thank you for visiting with [place] today! It's our pleasure caring for you.  We will call you with information regarding your referral appointment. Cardiology. If you do not hear from [place] within the next 2 weeks, please let me know. It can take 1-2 weeks to get appointments set up with the specialists.   We will treat your pain with pred and flexeril; please f/u with Dr. Layne Benton if needed.   Always, go to ER if you experience chest pain and shortness of breath if you are concerned you are having heart attack.

## 2018-11-25 NOTE — Progress Notes (Signed)
Subjective  CC:  Chief Complaint  Patient presents with  . Shoulder Pain    Left side, has had TX before pain started getting worse 2-3 weeks ago  . Back Pain    Certain movements cause sharp needle stick pain    HPI: Jason Snyder is a 68 y.o. male who presents to the office today to address the problems listed above in the chief complaint.  68 year old male who was seen back in May for atypical throat tightening and chest pressure which he felt was associated with metformin.  He stopped metformin symptoms persist.  He describes it as intermittent and ongoing, not associated with exertion nor shortness of breath.  Describes as a heaviness in the chest.  Is not painful or uncomfortable.  Although he does feel that it gets better sometimes with rest.  He denies associated diaphoresis, nausea, vomiting or left-sided arm pain.  Recent pulmonology eval with chest CT lung cancer screenings showed athero-sclerotic plaques.  He has never had cardiology work-up or stress testing in the past, mostly due to his uninsured status.  He has hypertension, diabetes, he is on a statin.  He is not on an SGLT2 inhibitor at this point due to cost considerations.  Denies lower extremity edema, orthopnea or PND  COPD: Reviewed recent pulmonology notes.  Mild disease.  Lung cancer screening was negative  Recurrent left shoulder pain: Last evaluated back in September.  Responsive to prednisone.  Reports arthritis and possible rotator cuff tendinopathy.  No recent injury.  Has pain with certain motions.  Also some anterior shoulder pain.  Chronic low back pain on chronic low-dose narcotics, mildly active at this point.  Has seen sports medicine and orthopedics.  Status post epidural steroid injection which was helpful last year.  Now mildly active again.  No radicular pain or weakness. Assessment  1. Left chest pressure   2. Essential hypertension   3. Chronic obstructive pulmonary disease, unspecified COPD type  ([place])   4. Rotator cuff tendonitis, left   5. Abnormal EKG   6. Primary osteoarthritis involving multiple joints      Plan   Left chest pressure, atypical: Given multiple risk factors, abnormal EKG, and with persistent symptoms will refer to cardiology for further evaluation.  Education given.  Continue current medications including aspirin.  Hypertension with good control  Left shoulder pain and chronic low back pain: Prednisone and Flexeril per patient request.  Recommend further sports medicine evaluation if persists.  X-ray today shows chronic osteoarthritic changes in the joint.  COPD: Stable per pulmonology.  Follow up: Return for as scheduled.  01/12/2019  Orders Placed This Encounter  Procedures  . DG Shoulder Left  . Ambulatory referral to Cardiology  . EKG 12-Lead   Meds ordered this encounter  Medications  . cyclobenzaprine (FLEXERIL) 10 MG tablet    Sig: Take 1 tablet (10 mg total) by mouth 3 (three) times daily as needed for muscle spasms.    Dispense:  30 tablet    Refill:  0  . predniSONE (DELTASONE) 10 MG tablet    Sig: Take 4 tabs qd x 2 days, 3 qd x 2 days, 2 qd x 2d, 1qd x 3 days    Dispense:  21 tablet    Refill:  0      I reviewed the patients updated PMH, FH, and SocHx.    Patient Active Problem List   Diagnosis Date Noted  . Smoking greater than 30 pack years - quit 03/2018  10/03/2017    Priority: High  . Controlled type 2 diabetes mellitus without complication, without long-term current use of insulin ([place]) 07/12/2014    Priority: High  . COPD (chronic obstructive pulmonary disease) ([place]) 01/07/2014    Priority: High  . Chronic pain 10/07/2013    Priority: High  . OSA on CPAP 10/07/2013    Priority: High  . Essential hypertension 08/31/2010    Priority: High  . Obesity (BMI 30.0-34.9) 10/03/2017    Priority: Medium  . BPH without obstruction/lower urinary tract symptoms 10/03/2017    Priority: Medium  . Gastroesophageal reflux disease  01/10/2015    Priority: Medium  . Primary osteoarthritis involving multiple joints 01/10/2015    Priority: Medium  . 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    Priority: Medium  . Rosacea 10/03/2017    Priority: Low  . 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    Priority: Low  . Encounter for screening for lung cancer 11/09/2018   Current Meds  Medication Sig  . acetaminophen-codeine (TYLENOL #3) 300-30 MG tablet Take 1 tablet by mouth every 6 (six) hours as needed for moderate pain.  Marland Kitchen albuterol (VENTOLIN HFA) 108 (90 Base) MCG/ACT inhaler INHALE 2 PUFFS BY MOUTH EVERY 4 HOURS AS NEEDED FOR WHEEZING  . amLODipine (NORVASC) 10 MG tablet Take 1 tablet (10 mg total) by mouth daily.  Marland Kitchen aspirin 81 MG EC tablet Take by mouth.  Marland Kitchen atorvastatin (LIPITOR) 10 MG tablet Take 1 tablet (10 mg total) by mouth at bedtime.  . diclofenac sodium (VOLTAREN) 1 % GEL PLACE 2 GRAMS ON TO THE SKIN 4 TIMES A DAY AS DIRECTED  . finasteride (PROSCAR) 5 MG tablet   . fluticasone (FLONASE) 50 MCG/ACT nasal spray Place into the nose.  Marland Kitchen glipiZIDE (GLUCOTROL XL) 10 MG 24 hr tablet Take 1 tablet (10 mg total) by mouth daily with breakfast.  . Glycopyrrolate-Formoterol (BEVESPI AEROSPHERE) 9-4.8 MCG/ACT AERO Inhale 2 puffs into the lungs 2 (two) times a day.  . hydrochlorothiazide (HYDRODIURIL) 25 MG tablet Take 1 tablet (25 mg total) by mouth daily.  . Loratadine (CLARITIN) 10 MG CAPS Take by mouth.  . losartan (COZAAR) 100 MG tablet Take 1 tablet (100 mg total) by mouth daily.  . metFORMIN (GLUCOPHAGE) 500 MG tablet Take 1 tablet (500 mg total) by mouth 2 (two) times daily with a meal.  . metoprolol tartrate (LOPRESSOR) 50 MG tablet TAKE ONE TABLET BY MOUTH TWICE DAILY   . Multiple Vitamin (MULTIVITAMIN) capsule Take by mouth.  . neomycin-polymyxin-hydrocortisone (CORTISPORIN) OTIC solution Place 3 drops into the left ear 4 (four) times daily.  . tamsulosin (FLOMAX) 0.4 MG CAPS capsule Take 2 capsules (0.8 mg total) by mouth daily.   Marland Kitchen testosterone cypionate (DEPOTESTOSTERONE CYPIONATE) 200 MG/ML injection inject 1 ml into the muscle every 14 days  . [DISCONTINUED] Tiotropium Bromide-Olodaterol (STIOLTO RESPIMAT) 2.5-2.5 MCG/ACT AERS Inhale 2 puffs into the lungs daily.    Allergies: Patient is allergic to rubus fruticosus. Family History: Patient family history includes Heart disease in his father and maternal uncle; Lung cancer in his father and sister; Pulmonary embolism in his mother. Social History:  Patient  reports that he quit smoking about 8 months ago. His smoking use included cigarettes. He has a 15.00 pack-year smoking history. He uses smokeless tobacco. He reports previous alcohol use. He reports previous drug use.  Review of Systems: Constitutional: Negative for fever malaise or anorexia Cardiovascular: negative for chest pain Respiratory: negative for SOB or persistent cough Gastrointestinal: negative for abdominal  pain  Objective  Vitals: BP (!) 142/88   Pulse 65   Temp 98.1 F (36.7 C) (Oral)   Resp 16   Ht 6' (1.829 m)   Wt 257 lb 3.2 oz (116.7 kg)   SpO2 95%   BMI 34.88 kg/m  General: no acute distress , A&Ox3 HEENT: PEERL, conjunctiva normal, Oropharynx moist,neck is supple Cardiovascular:  RRR without murmur or gallop.  Respiratory:  Good breath sounds bilaterally, CTAB with normal respiratory effort Skin:  Warm, no rashes Left shoulder: Full range of motion with pain abduction greater than 90 degrees, anterior tenderness present, positive empty can testing Normal gait  EKG: First-degree AV block, Q waves in lead III, flipped T's in inferior leads, abnormal EKG without comparison   Commons side effects, risks, benefits, and alternatives for medications and treatment plan prescribed today were discussed, and the patient expressed understanding of the given instructions. Patient is instructed to call or message via MyChart if he/she has any questions or concerns regarding our treatment  plan. No barriers to understanding were identified. We discussed Red Flag symptoms and signs in detail. Patient expressed understanding regarding what to do in case of urgent or emergency type symptoms.   Medication list was reconciled, printed and provided to the patient in AVS. Patient instructions and summary information was reviewed with the patient as documented in the AVS. This note was prepared with assistance of Dragon voice recognition software. Occasional wrong-word or sound-a-like substitutions may have occurred due to the inherent limitations of voice recognition software

## 2018-11-27 ENCOUNTER — Encounter: Payer: Self-pay | Admitting: *Deleted

## 2018-11-27 DIAGNOSIS — R9431 Abnormal electrocardiogram [ECG] [EKG]: Secondary | ICD-10-CM | POA: Insufficient documentation

## 2018-11-27 DIAGNOSIS — R079 Chest pain, unspecified: Secondary | ICD-10-CM | POA: Insufficient documentation

## 2018-11-27 NOTE — Telephone Encounter (Signed)
Bevespi was sent to patient's pharmacy since [place] is not a covered alternative. Nothing further needed.  Dr. Concepcion Living received your message, he suggested you try Bevespi instead of [place]. Are you willing to try it? It may help if you call your insurance to see which inhalers they cover. Let us know. Hope you are having a great day.

## 2018-11-30 ENCOUNTER — Other Ambulatory Visit: Payer: Self-pay | Admitting: Family Medicine

## 2018-11-30 DIAGNOSIS — I359 Nonrheumatic aortic valve disorder, unspecified: Secondary | ICD-10-CM | POA: Insufficient documentation

## 2018-11-30 DIAGNOSIS — I700 Atherosclerosis of aorta: Secondary | ICD-10-CM | POA: Insufficient documentation

## 2018-11-30 DIAGNOSIS — I2510 Atherosclerotic heart disease of native coronary artery without angina pectoris: Secondary | ICD-10-CM | POA: Insufficient documentation

## 2018-11-30 MED ORDER — ACETAMINOPHEN-CODEINE #3 300-30 MG PO TABS: 1.0000 | ORAL_TABLET | Freq: Four times a day (QID) | ORAL | 0 refills | Status: DC | PRN

## 2018-11-30 NOTE — Progress Notes (Signed)
Cardiology Office Note:    Date:  12/01/2018   ID:  Arch Methot, DOB 11-Jun-1950, MRN [id number]  PCP:  [name], MD  Cardiologist:  Shirlee More, MD   Referring MD: [name], MD  ASSESSMENT:    1. Abnormal EKG   2. Coronary artery calcification seen on CAT scan   3. Thoracic aorta atherosclerosis (HCC)   4. Calcification of aortic valve   5. Chest pain of uncertain etiology   6. Pre-procedure lab exam    PLAN:    In order of problems listed above:  1. Chest pain - Onset January, daily, lasting few seconds, worse with activity, relieved by rest. Cardiac risk factors include smoking history, family history, HTN, HLD. Plan for cardiac CTA to assess for CAD. Alternate etiology COPD and known L shoulder injury. Continue GDMT of aspirin, statin, beta blocker.  2. SOB - On inhalers for COPD since January. Noted with exertion. Etiology COPD versus CAD versus CHF vs deconditioning.  3. Abnormal EKG - Repeat EKG today sinus rhythm with R axis deviation. Likely lead reversal at previous provider office.  4. Coronary artery calcification on CT - Noted on CT 11/02/2018. Continue management of risk factors including HTN, HLD. Plan for cardiac CTA as above. 5. Thoracic aorta atherosclerosis - Noted on CT 11/02/2018. Continue management of risk factors including HTN, HLD.  6. Calcification of aortic valve - Noted on recent CT 11/02/2018. Plan for echocardiogram.  7. HTN - BP elevated today with readings 168/92 and 150/92 subsequently. He tells me his BP is normally better. Of note, currently on prednisone. Asked him to check his BP at least 3 times per week and keep a log. Continue current anti-hypertensive regimen by PCP of Losartan, Metoprolol, Norvasc, HCTZ. If BP remains high consider change to higher potency ARB or change metoprolol to carvedilol.   Next appointment 2 months. Echo and cardiac CT prior to appt.    Medication Adjustments/Labs and Tests Ordered: Current medicines are  reviewed at length with the patient today.  Concerns regarding medicines are outlined above.  Orders Placed This Encounter  Procedures  . CT CORONARY FRACTIONAL FLOW RESERVE DATA PREP  . CT CORONARY FRACTIONAL FLOW RESERVE FLUID ANALYSIS  . CT CORONARY MORPH W/CTA COR W/SCORE W/CA W/CM &/OR WO/CM  . Basic Metabolic Panel (BMET)  . Basic Metabolic Panel (BMET)  . ECHOCARDIOGRAM COMPLETE   No orders of the defined types were placed in this encounter.    Chief Complaint  Patient presents with  . Chest Pain  68 yo male presents for consult of chest pain.  History of Present Illness:    Jason Snyder is a 68 y.o. male with hypertension and mild COPD who is being seen today for the evaluation of chest pain at the request of [name], MD. He has been seen by pulmonary also has obstructive sleep apnea stable on CPAP and abnormal CT of the chest showing pulmonary nodules which are felt to be likely benign with continued annual follow-up.  He is also noted to have coronary and aortic calcification/atherosclerosis  CT Chest 11/02/2018: There isaortic atherosclerosis, as well as atherosclerosis of the great vessels of the mediastinum and the coronary arteries, including calcified atherosclerotic plaque in the left main, left anterior descending, left circumflex and right coronary arteries. Mild calcifications of the aortic valve.  He had an EKG 11/25/2018 read as abnormal the rhythm is sinus I suspect he has lead inversions in lead I and aVL I will  require a follow-up EKG to be repeated at his office visit.  Reports feeling well today. He is a retired Orthoptist. Tells me his episodes of chest pressure started in January. Occur daily in the middle of his chest, do not radiate, and last a few seconds. Notices they happen with activity such as mopping. Not associated with diaphoresis.   Tells me he has been taking prednisone for a L shoulder problem. Does not  routinely check BP at home.   Has also been on inhalers for COPD and SOB since January. Reports breathing is somewhat better. Notices increased shortness of breath when lying down. Denies edema. Reports compliance with his CPAP for OSA.   Previous smoker, quit October 2019 by using nicotine patch for 3 weeks. Congratulated him on his smoking cessation.   Notable family history of cardiac disease in both his mother and father. His father's first MI was in his 62s and he reports losing both of his parents young.   Past Medical History:  Diagnosis Date  . Abnormal EKG 11/27/2018  . BMI 30.0-30.9,adult   . BPH without obstruction/lower urinary tract symptoms 10/03/2017  . Chronic left shoulder pain c  . Chronic low back pain with right-sided sciatica   . Chronic pain 10/07/2013  . Controlled type 2 diabetes mellitus without complication, without long-term current use of insulin ([place]) 07/12/2014  . COPD (chronic obstructive pulmonary disease) ([place]) 01/07/2014   PFTs 07/2018 minimal airway obstruction, overinflation and no response to bronchodilators.   . DDD (degenerative disc disease), lumbar   . Diabetes mellitus without complication ([place])   . 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 Encounter for screening for lung cancer 11/09/2018   Chest CT: neg lung cancer: + COPD changes and cardiovascular calcifications  . Essential hypertension 08/31/2010   Elevated ASCVD risk - started lipitor 04/2013  . Gastroesophageal reflux disease 01/10/2015  . History of lumbar surgery   . Hyperlipidemia   . Hypertension   . 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 Left-sided chest pain 11/27/2018  . Nicotine dependence, cigarettes, uncomplicated   . Obesity (BMI 30-39.9)   . Obesity (BMI 30.0-34.9) 10/03/2017  . OSA on CPAP 10/07/2013  . Primary osteoarthritis involving multiple joints 01/10/2015  . Rosacea   . Smoking greater than 30 pack years - quit 03/2018  10/03/2017  . Thoracic and lumbosacral neuritis     Past Surgical History:  Procedure Laterality Date  . ARTHROSCOPIC REPAIR ACL    . SHOULDER ARTHROSCOPY    . SPINE SURGERY      Current Medications: Current Meds  Medication Sig  . acetaminophen-codeine (TYLENOL #3) 300-30 MG tablet Take 1 tablet by mouth every 6 (six) hours as needed for moderate pain.  Marland Kitchen albuterol (VENTOLIN HFA) 108 (90 Base) MCG/ACT inhaler INHALE 2 PUFFS BY MOUTH EVERY 4 HOURS AS NEEDED FOR WHEEZING  . amLODipine (NORVASC) 10 MG tablet Take 1 tablet (10 mg total) by mouth daily.  Marland Kitchen aspirin 81 MG EC tablet Take by mouth.  Marland Kitchen atorvastatin (LIPITOR) 10 MG tablet Take 1 tablet (10 mg total) by mouth at bedtime.  . cyclobenzaprine (FLEXERIL) 10 MG tablet Take 1 tablet (10 mg total) by mouth 3 (three) times daily as needed for muscle spasms.  . diclofenac sodium (VOLTAREN) 1 % GEL PLACE 2 GRAMS ON TO THE SKIN 4 TIMES A DAY AS DIRECTED  . finasteride (PROSCAR) 5 MG tablet   . fluticasone (FLONASE)  50 MCG/ACT nasal spray Place into the nose.  Marland Kitchen glipiZIDE (GLUCOTROL XL) 10 MG 24 hr tablet Take 1 tablet (10 mg total) by mouth daily with breakfast.  . Glycopyrrolate-Formoterol (BEVESPI AEROSPHERE) 9-4.8 MCG/ACT AERO Inhale 2 puffs into the lungs 2 (two) times a day.  . hydrochlorothiazide (HYDRODIURIL) 25 MG tablet Take 1 tablet (25 mg total) by mouth daily.  . Loratadine (CLARITIN) 10 MG CAPS Take by mouth.  . losartan (COZAAR) 100 MG tablet Take 1 tablet (100 mg total) by mouth daily.  . metFORMIN (GLUCOPHAGE) 500 MG tablet Take 1 tablet (500 mg total) by mouth 2 (two) times daily with a meal.  . metoprolol tartrate (LOPRESSOR) 50 MG tablet TAKE ONE TABLET BY MOUTH TWICE DAILY   . Multiple Vitamin (MULTIVITAMIN) capsule Take by mouth.  . neomycin-polymyxin-hydrocortisone (CORTISPORIN) OTIC solution Place 3 drops into the left ear 4 (four) times daily.  . predniSONE (DELTASONE) 10 MG tablet Take 4 tabs qd x 2 days, 3 qd x 2  days, 2 qd x 2d, 1qd x 3 days  . tamsulosin (FLOMAX) 0.4 MG CAPS capsule Take 2 capsules (0.8 mg total) by mouth daily.  Marland Kitchen testosterone cypionate (DEPOTESTOSTERONE CYPIONATE) 200 MG/ML injection inject 1 ml into the muscle every 14 days     Allergies:   Rubus fruticosus   Social History   Socioeconomic History  . Marital status: Married    Spouse name: Not on file  . Number of children: Not on file  . Years of education: Not on file  . Highest education level: Not on file  Occupational History  . Not on file  Social Needs  . Financial resource strain: Not on file  . Food insecurity    Worry: Not on file    Inability: Not on file  . Transportation needs    Medical: Not on file    Non-medical: Not on file  Tobacco Use  . Smoking status: Former Smoker    Packs/day: 0.50    Years: 30.00    Pack years: 15.00    Types: Cigarettes    Quit date: 03/05/2018    Years since quitting: 0.7  . Smokeless tobacco: Current User  . Tobacco comment: no cigarettes since 03/05/18  Substance and Sexual Activity  . Alcohol use: Not Currently  . Drug use: Not Currently  . Sexual activity: Not Currently  Lifestyle  . Physical activity    Days per week: Not on file    Minutes per session: Not on file  . Stress: Not on file  Relationships  . Social Herbalist on phone: Not on file    Gets together: Not on file    Attends religious service: Not on file    Active member of club or organization: Not on file    Attends meetings of clubs or organizations: Not on file    Relationship status: Not on file  Other Topics Concern  . Not on file  Social History Narrative  . Not on file     Family History: The patient's family history includes Heart disease in his father and maternal uncle; Lung cancer in his father and sister; Pulmonary embolism in his mother.  ROS:   Review of Systems  Constitution: Negative for chills, diaphoresis, fever and malaise/fatigue.  Cardiovascular:  Negative for irregular heartbeat, leg swelling, near-syncope and palpitations.       + chest pressure  Respiratory: Positive for shortness of breath and sleep disturbances due to breathing. Negative  for cough and wheezing.   Gastrointestinal: Negative for diarrhea, nausea and vomiting.  Neurological: Negative for dizziness, light-headedness and weakness.   Please see the history of present illness.     All other systems reviewed and are negative.  EKGs/Labs/Other Studies Reviewed:    The following studies were reviewed today: CT CT Chest 11/02/2018: There isaortic atherosclerosis, as well as atherosclerosis of the great vessels of the mediastinum and the coronary arteries, including calcified atherosclerotic plaque in the left main, left anterior descending, left circumflex and right coronary arteries. Mild calcifications of the aortic valve.   EKG:  EKG is ordered today.  The ekg ordered today is personally reviewed and demonstrates sinus rhythm rate 68 with right axis deviation and nonspecific ST/T wave changes.   Recent Labs: 04/06/2018: ALT 40; BUN 14; Creatinine, Ser 0.95; Hemoglobin 16.2; Platelets 217.0; Potassium 4.4; Sodium 136; TSH 1.44  Recent Lipid Panel    Component Value Date/Time   CHOL 149 04/06/2018 0836   TRIG 137.0 04/06/2018 0836   HDL 42.20 04/06/2018 0836   CHOLHDL 4 04/06/2018 0836   VLDL 27.4 04/06/2018 0836   LDLCALC 80 04/06/2018 0836    Physical Exam:    VS:  BP (!) 150/90 (BP Location: Right Arm, Patient Position: Sitting, Cuff Size: Normal)   Pulse 68   Ht 6\' 2"  (1.88 m)   Wt 258 lb 1.9 oz (117.1 kg)   SpO2 96%   BMI 33.14 kg/m     Wt Readings from Last 3 Encounters:  12/01/18 258 lb 1.9 oz (117.1 kg)  11/25/18 257 lb 3.2 oz (116.7 kg)  11/11/18 264 lb (119.7 kg)     GEN:  Well nourished, well developed in no acute distress HEENT: Normal NECK: No JVD; No carotid bruits LYMPHATICS: No lymphadenopathy CARDIAC: RRR, no murmurs, rubs,  gallops RESPIRATORY:  Clear to auscultation without rales, wheezing or rhonchi  ABDOMEN: Soft, non-tender, non-distended MUSCULOSKELETAL:  No edema; No deformity  SKIN: Warm and dry NEUROLOGIC:  Alert and oriented x 3 PSYCHIATRIC:  Normal affect     Signed, Shirlee More, MD  12/01/2018 9:14 AM    [place]

## 2018-11-30 NOTE — Telephone Encounter (Signed)
Routing to appropriate office 

## 2018-12-01 ENCOUNTER — Encounter: Payer: Self-pay | Admitting: Cardiology

## 2018-12-01 ENCOUNTER — Ambulatory Visit (INDEPENDENT_AMBULATORY_CARE_PROVIDER_SITE_OTHER): Payer: Medicare Other | Admitting: Cardiology

## 2018-12-01 ENCOUNTER — Other Ambulatory Visit: Payer: Self-pay

## 2018-12-01 VITALS — BP 150/90 | HR 68 | Ht 74.0 in | Wt 258.1 lb

## 2018-12-01 DIAGNOSIS — R079 Chest pain, unspecified: Secondary | ICD-10-CM

## 2018-12-01 DIAGNOSIS — R0602 Shortness of breath: Secondary | ICD-10-CM | POA: Diagnosis not present

## 2018-12-01 DIAGNOSIS — Z01812 Encounter for preprocedural laboratory examination: Secondary | ICD-10-CM

## 2018-12-01 DIAGNOSIS — R0789 Other chest pain: Secondary | ICD-10-CM | POA: Diagnosis not present

## 2018-12-01 DIAGNOSIS — R9431 Abnormal electrocardiogram [ECG] [EKG]: Secondary | ICD-10-CM | POA: Diagnosis not present

## 2018-12-01 DIAGNOSIS — I359 Nonrheumatic aortic valve disorder, unspecified: Secondary | ICD-10-CM

## 2018-12-01 DIAGNOSIS — I700 Atherosclerosis of aorta: Secondary | ICD-10-CM | POA: Diagnosis not present

## 2018-12-01 DIAGNOSIS — I2510 Atherosclerotic heart disease of native coronary artery without angina pectoris: Secondary | ICD-10-CM

## 2018-12-01 DIAGNOSIS — I10 Essential (primary) hypertension: Secondary | ICD-10-CM | POA: Diagnosis not present

## 2018-12-01 NOTE — Patient Instructions (Signed)
Medication Instructions:  Your physician recommends that you continue on your current medications as directed. Please refer to the Current Medication list given to you today.  If you need a refill on your cardiac medications before your next appointment, please call your pharmacy.   Lab work: Your physician recommends that you return for lab work in: TODAY BMP  3-7 days prior to CT: BMP   If you have labs (blood work) drawn today and your tests are completely normal, you will receive your results only by: Marland Kitchen MyChart Message (if you have MyChart) OR . A paper copy in the mail If you have any lab test that is abnormal or we need to change your treatment, we will call you to review the results.  Testing/Procedures: Your physician has requested that you have an echocardiogram. Echocardiography is a painless test that uses sound waves to create images of your heart. It provides your doctor with information about the size and shape of your heart and how well your heart's chambers and valves are working. This procedure takes approximately one hour. There are no restrictions for this procedure.  Your physician has requested that you have cardiac CT. Cardiac computed tomography (CT) is a painless test that uses an x-ray machine to take clear, detailed pictures of your heart. For further information please visit [email or web address]. Please follow instruction sheet as given.   Please arrive at the [facility] main entrance of [facility] at xx:xx AM (30-45 minutes prior to test start time)  [facility] [place], [place] [address] [phone]  Proceed to the [facility] Radiology Department (First Floor).  Please follow these instructions carefully (unless otherwise directed):  Hold all erectile dysfunction medications at least 48 hours prior to test.  On the Night Before the Test: . Be sure to Drink plenty of water. . Do not consume any  caffeinated/decaffeinated beverages or chocolate 12 hours prior to your test. . Do not take any antihistamines 12 hours prior to your test. . If you take Metformin do not take 24 hours prior to test. . DO NOT TAKE GLIPIZIDE THE DAY OF THE TEST   On the Day of the Test: . Drink plenty of water. Do not drink any water within one hour of the test. . Do not eat any food 4 hours prior to the test. . You may take your regular medications prior to the test.  . Take metoprolol (Lopressor) two hours prior to test. . HOLD Furosemide/Hydrochlorothiazide morning of the test.                 -If HR is less than 55 BPM- No Beta Blocker                -IF HR is greater than 55 BPM and patient is less than or equal to 40 yrs old Lopressor 50 mg x1.             After the Test: . Drink plenty of water. . After receiving IV contrast, you may experience a mild flushed feeling. This is normal. . On occasion, you may experience a mild rash up to 24 hours after the test. This is not dangerous. If this occurs, you can take Benadryl 25 mg and increase your fluid intake. . If you experience trouble breathing, this can be serious. If it is severe call 911 IMMEDIATELY. If it is mild, please call our office. . If you take any of these medications: Glipizide/Metformin,  Avandament, Glucavance, please do not take 48 hours after completing test.   Follow-Up: At [facility], you and your health needs are our priority.  As part of our continuing mission to provide you with exceptional heart care, we have created designated Provider Care Teams.  These Care Teams include your primary Cardiologist (physician) and Advanced Practice Providers (APPs -  Physician Assistants and Nurse Practitioners) who all work together to provide you with the care you need, when you need it. You will need a follow up appointment in 2 months.   Any Other Special Instructions Will Be Listed Below (If Applicable).   Cardiac CT Angiogram  A  cardiac CT angiogram is a procedure to look at the heart and the area around the heart. It may be done to help find the cause of chest pains or other symptoms of heart disease. During this procedure, a large X-ray machine, called a CT scanner, takes detailed pictures of the heart and the surrounding area after a dye (contrast material) has been injected into blood vessels in the area. The procedure is also sometimes called a coronary CT angiogram, coronary artery scanning, or CTA. A cardiac CT angiogram allows the health care provider to see how well blood is flowing to and from the heart. The health care provider will be able to see if there are any problems, such as:  Blockage or narrowing of the coronary arteries in the heart.  Fluid around the heart.  Signs of weakness or disease in the muscles, valves, and tissues of the heart. Tell a health care provider about:  Any allergies you have. This is especially important if you have had a previous allergic reaction to contrast dye.  All medicines you are taking, including vitamins, herbs, eye drops, creams, and over-the-counter medicines.  Any blood disorders you have.  Any surgeries you have had.  Any medical conditions you have.  Whether you are pregnant or may be pregnant.  Any anxiety disorders, chronic pain, or other conditions you have that may increase your stress or prevent you from lying still. What are the risks? Generally, this is a safe procedure. However, problems may occur, including:  Bleeding.  Infection.  Allergic reactions to medicines or dyes.  Damage to other structures or organs.  Kidney damage from the dye or contrast that is used.  Increased risk of cancer from radiation exposure. This risk is low. Talk with your health care provider about: ? The risks and benefits of testing. ? How you can receive the lowest dose of radiation. What happens before the procedure?  Wear comfortable clothing and remove any  jewelry, glasses, dentures, and hearing aids.  Follow instructions from your health care provider about eating and drinking. This may include: ? For 12 hours before the test - avoid caffeine. This includes tea, coffee, soda, energy drinks, and diet pills. Drink plenty of water or other fluids that do not have caffeine in them. Being well-hydrated can prevent complications. ? For 4-6 hours before the test - stop eating and drinking. The contrast dye can cause nausea, but this is less likely if your stomach is empty.  Ask your health care provider about changing or stopping your regular medicines. This is especially important if you are taking diabetes medicines, blood thinners, or medicines to treat erectile dysfunction. What happens during the procedure?  Hair on your chest may need to be removed so that small sticky patches called electrodes can be placed on your chest. These will transmit information  that helps to monitor your heart during the test.  An IV tube will be inserted into one of your veins.  You might be given a medicine to control your heart rate during the test. This will help to ensure that good images are obtained.  You will be asked to lie on an exam table. This table will slide in and out of the CT machine during the procedure.  Contrast dye will be injected into the IV tube. You might feel warm, or you may get a metallic taste in your mouth.  You will be given a medicine (nitroglycerin) to relax (dilate) the arteries in your heart.  The table that you are lying on will move into the CT machine tunnel for the scan.  The person running the machine will give you instructions while the scans are being done. You may be asked to: ? Keep your arms above your head. ? Hold your breath. ? Stay very still, even if the table is moving.  When the scanning is complete, you will be moved out of the machine.  The IV tube will be removed. The procedure may vary among health care  providers and hospitals. What happens after the procedure?  You might feel warm, or you may get a metallic taste in your mouth from the contrast dye.  You may have a headache from the nitroglycerin.  After the procedure, drink water or other fluids to wash (flush) the contrast material out of your body.  Contact a health care provider if you have any symptoms of allergy to the contrast. These symptoms include: ? Shortness of breath. ? Rash or hives. ? A racing heartbeat.  Most people can return to their normal activities right after the procedure. Ask your health care provider what activities are safe for you.  It is up to you to get the results of your procedure. Ask your health care provider, or the department that is doing the procedure, when your results will be ready. Summary  A cardiac CT angiogram is a procedure to look at the heart and the area around the heart. It may be done to help find the cause of chest pains or other symptoms of heart disease.  During this procedure, a large X-ray machine, called a CT scanner, takes detailed pictures of the heart and the surrounding area after a dye (contrast material) has been injected into blood vessels in the area.  Ask your health care provider about changing or stopping your regular medicines before the procedure. This is especially important if you are taking diabetes medicines, blood thinners, or medicines to treat erectile dysfunction.  After the procedure, drink water or other fluids to wash (flush) the contrast material out of your body. This information is not intended to replace advice given to you by your health care provider. Make sure you discuss any questions you have with your health care provider. Document Released: 05/02/2008 Document Revised: 05/02/2017 Document Reviewed: 04/08/2016 Elsevier Patient Education  2020 [employer].

## 2018-12-02 LAB — BASIC METABOLIC PANEL
BUN/Creatinine Ratio: 13 (ref 10–24)
BUN: 14 mg/dL (ref 8–27)
CO2: 24 mmol/L (ref 20–29)
Calcium: 9.9 mg/dL (ref 8.6–10.2)
Chloride: 95 mmol/L — ABNORMAL LOW (ref 96–106)
Creatinine, Ser: 1.04 mg/dL (ref 0.76–1.27)
GFR calc Af Amer: 87 mL/min/{1.73_m2} (ref >59)
GFR calc non Af Amer: 75 mL/min/{1.73_m2} (ref >59)
Glucose: 148 mg/dL — ABNORMAL HIGH (ref 65–99)
Potassium: 4.8 mmol/L (ref 3.5–5.2)
Sodium: 138 mmol/L (ref 134–144)

## 2018-12-09 ENCOUNTER — Other Ambulatory Visit: Payer: Self-pay

## 2018-12-09 ENCOUNTER — Ambulatory Visit (HOSPITAL_BASED_OUTPATIENT_CLINIC_OR_DEPARTMENT_OTHER)
Admission: RE | Admit: 2018-12-09 | Discharge: 2018-12-09 | Disposition: A | Discharge status: Home / Self Care | Payer: Medicare Other | Source: Ambulatory Visit | Attending: Cardiology | Admitting: Cardiology

## 2018-12-09 DIAGNOSIS — R0789 Other chest pain: Secondary | ICD-10-CM

## 2018-12-09 MED ORDER — PERFLUTREN LIPID MICROSPHERE
1.0000 mL | INTRAVENOUS | Status: AC | PRN
  Administered 2018-12-09: 3 mL via INTRAVENOUS
  Filled 2018-12-09: qty 10

## 2018-12-09 NOTE — Progress Notes (Signed)
  Echocardiogram 2D Echocardiogram has been performed.  Cardell Peach 12/09/2018, 3:12 PM

## 2018-12-10 ENCOUNTER — Telehealth: Payer: Self-pay

## 2018-12-10 NOTE — Telephone Encounter (Signed)
Patient called and notified of test results. 

## 2018-12-10 NOTE — Telephone Encounter (Signed)
-----   Message from Richardo Priest, MD sent at 12/09/2018  4:53 PM EDT ----- Normal or stable result  This is normal he does not appear to have congestive heart failure

## 2018-12-14 ENCOUNTER — Other Ambulatory Visit: Payer: Self-pay | Admitting: Family Medicine

## 2018-12-14 DIAGNOSIS — Z01812 Encounter for preprocedural laboratory examination: Secondary | ICD-10-CM | POA: Diagnosis not present

## 2018-12-15 LAB — BASIC METABOLIC PANEL
BUN/Creatinine Ratio: 11 (ref 10–24)
BUN: 11 mg/dL (ref 8–27)
CO2: 21 mmol/L (ref 20–29)
Calcium: 9.4 mg/dL (ref 8.6–10.2)
Chloride: 95 mmol/L — ABNORMAL LOW (ref 96–106)
Creatinine, Ser: 0.97 mg/dL (ref 0.76–1.27)
GFR calc Af Amer: 94 mL/min/{1.73_m2} (ref >59)
GFR calc non Af Amer: 82 mL/min/{1.73_m2} (ref >59)
Glucose: 115 mg/dL — ABNORMAL HIGH (ref 65–99)
Potassium: 4.7 mmol/L (ref 3.5–5.2)
Sodium: 136 mmol/L (ref 134–144)

## 2018-12-16 ENCOUNTER — Telehealth (HOSPITAL_COMMUNITY): Payer: Self-pay | Admitting: Emergency Medicine

## 2018-12-16 NOTE — Telephone Encounter (Signed)
Reaching out to patient to offer assistance regarding upcoming cardiac imaging study; pt verbalizes understanding of appt date/time, parking situation and where to check in, pre-test NPO status and medications ordered, and verified current allergies; name and call back number provided for further questions should they arise Kizzy Olafson RN Navigator Cardiac Imaging [facility] [phone] office [phone] cell  Pt denies covid symptoms, verbalized understanding of visitor policy. 

## 2018-12-17 ENCOUNTER — Ambulatory Visit (HOSPITAL_COMMUNITY)
Admission: RE | Admit: 2018-12-17 | Discharge: 2018-12-17 | Disposition: A | Discharge status: Home / Self Care | Payer: Medicare Other | Source: Ambulatory Visit | Attending: Cardiology | Admitting: Cardiology

## 2018-12-17 ENCOUNTER — Other Ambulatory Visit: Payer: Self-pay

## 2018-12-17 DIAGNOSIS — Z006 Encounter for examination for normal comparison and control in clinical research program: Secondary | ICD-10-CM

## 2018-12-17 DIAGNOSIS — R0789 Other chest pain: Secondary | ICD-10-CM | POA: Diagnosis not present

## 2018-12-17 DIAGNOSIS — I2510 Atherosclerotic heart disease of native coronary artery without angina pectoris: Secondary | ICD-10-CM | POA: Insufficient documentation

## 2018-12-17 MED ORDER — NITROGLYCERIN 0.4 MG SL SUBL
0.8000 mg | SUBLINGUAL_TABLET | Freq: Once | SUBLINGUAL | Status: AC
  Administered 2018-12-17: 11:00:00 0.8 mg via SUBLINGUAL
  Filled 2018-12-17: qty 25

## 2018-12-17 MED ORDER — IOHEXOL 350 MG/ML SOLN
100.0000 mL | Freq: Once | INTRAVENOUS | Status: AC | PRN
  Administered 2018-12-17: 12:00:00 100 mL via INTRAVENOUS

## 2018-12-17 MED ORDER — NITROGLYCERIN 0.4 MG SL SUBL
SUBLINGUAL_TABLET | SUBLINGUAL | Status: AC
  Filled 2018-12-17: qty 2

## 2018-12-17 NOTE — Research (Signed)
Cadfem Informed Consent    Patient Name: Jason Snyder    Subject met inclusion and exclusion criteria.  The informed consent form, study requirements and expectations were reviewed with the subject and questions and concerns were addressed prior to the signing of the consent form.  The subject verbalized understanding of the trail requirements.  The subject agreed to participate in the CADFEM trial and signed the informed consent.  The informed consent was obtained prior to performance of any protocol-specific procedures for the subject.  A copy of the signed informed consent was given to the subject and a copy was placed in the subject's medical record.   [name]

## 2018-12-18 DIAGNOSIS — I2510 Atherosclerotic heart disease of native coronary artery without angina pectoris: Secondary | ICD-10-CM | POA: Diagnosis not present

## 2018-12-21 ENCOUNTER — Telehealth: Payer: Self-pay

## 2018-12-21 NOTE — Telephone Encounter (Signed)
Patient aware of results of cardiac CTA per Dr Bettina Gavia.  Patient advised to follow up with Dr Bettina Gavia on 12-24-2018.  Patient agreed to plan and verbalized understanding.

## 2018-12-21 NOTE — Telephone Encounter (Signed)
Left message for patient to return call for results and an appointment with Dr Bettina Gavia. Will continue efforts.

## 2018-12-23 NOTE — H&P (View-Only) (Signed)
Cardiology Office Note:    Date:  12/24/2018   ID:  Jason Snyder, DOB 06-13-1950, MRN [id number]  PCP:  [name], MD  Cardiologist:  Shirlee More, MD    Referring MD: [name], MD    ASSESSMENT:    1. Coronary artery disease involving native coronary artery of native heart without angina pectoris   2. Pre-procedure lab exam   3. Essential hypertension   4. Chronic obstructive pulmonary disease, unspecified COPD type ([place])   5. Controlled type 2 diabetes mellitus without complication, without long-term current use of insulin ([place])   6. Mixed hyperlipidemia    PLAN:    In order of problems listed above:  1. He has high risk coronary anatomy left main multivessel CAD on cardiac CTA abnormal fractional flow reserve and in the context of diabetes advised to and agrees coronary angiography and appropriate revascularization.  Options risk and benefits were detailed informed consent obtained continue medical treatment including aspirin and statin 2. Stable hypertension continue current treatment 3. Stable known COPD he has a good quality of life and is not limited.  He has obvious emphysema on CT scan 4. Stable diabetes if additional drug therapy is needed GLP-1 agonist or SGLT2 antagonist is appropriate 5. Continue statin likely needs intensification adding Zetia at his follow-up visit  This was a complex visit with the volume of medical information reviewed with the patient shared decision making and formulating a plan for further evaluation including review of options benefits and risks and obtaining informed consent.  Next appointment: 4 weeks   Medication Adjustments/Labs and Tests Ordered: Current medicines are reviewed at length with the patient today.  Concerns regarding medicines are outlined above.  Orders Placed This Encounter  Procedures   CBC   Basic Metabolic Panel (BMET)   No orders of the defined types were placed in this encounter.   No chief  complaint on file.   History of Present Illness:    Jason Snyder is a 68 y.o. male with a hx of hypertension, COPD and CAC last seen 12/01/2018.  Cardiac CTA:   IMPRESSION: 1. Severe CAD, CADRADS = 4. CT FFR   Right Coronary Artery: Severe proximal to mid RCA mixed atherosclerotic plaque, 70-99% stenosis. Likely serial severe mixed atherosclerotic plaques in the mid RCA 70-99% stenosis. Distal vessel is small caliber but patent, with patent PDA and PL.  Left Main Coronary Artery: Minimal mixed atherosclerotic plaque in the proximal LM, <25% stenosis. Mild mixed atherosclerotic plaque in the distal left main crossing over into the proximal LAD and circumflex, 25-49%.  Left Anterior Descending Coronary Artery: Moderate mixed atherosclerotic plaque in the proximal LAD, 50-69% stenosis. Severe mixed atherosclerotic plaque in the proximal D1, and mid-distal D1, 70-99% stenosis. Moderate atherosclerotic plaque in the mid LAD, 50-69% stenosis.  Left Circumflex Artery: Mild scattered mixed atherosclerotic plaques in the proximal L circumflex, leading to a severe mixed atherosclerotic plaque in the proximal circumflex, 70-99% stenosis. Varying stenosis in this long segment lesions. Severe stenosis in the distal portion of dominant OM.  2. The patient's coronary artery calcium score is 2667, which places the patient in the 98th percentile.  3. Normal coronary origin with right dominance.  4. FFR:CT FFR analysis showed likely severe stenosis in the proximal RCA with possible total occlusion. There is also a severe stenosis in the distal OM and distal D1, which are small caliber vessels  Echo 12/09/2018:  1. The left ventricle has normal systolic function with an ejection  fraction of 60-65%. The cavity size was normal. There is mildly increased left ventricular wall thickness. Left ventricular diastolic Doppler parameters are consistent with impaired  relaxation.  2. The right  ventricle has normal systolic function. The cavity was normal. There is no increase in right ventricular wall thickness. 3. AV is normal, no AS  Compliance with diet, lifestyle and medications: Yes  Aniel is seen in the office in follow-up to review his cardiac CTA and echocardiogram.  Although his aortic valve is calcified he has no valve dysfunction left ventricular function is normal on echo.  His cardiac CTA shows high risk anatomy with multivessel severe coronary artery stenosis.  In the context of diabetes and risks advised to undergo coronary angiography and revascularization and he understands he may require bypass surgery.  The options benefits and risks of coronary angiography and intervention were discussed patient has high healthcare literacy and agrees.  He has no contraindication to dual antiplatelet therapy he is a very compliant man and despite his comorbidities hypertension diabetes hyperlipidemia and COPD has a good quality of life and is very active.  He has had no anginal discomfort. Past Medical History:  Diagnosis Date   Abnormal EKG 11/27/2018   BMI 30.0-30.9,adult    BPH without obstruction/lower urinary tract symptoms 10/03/2017   Chronic left shoulder pain c   Chronic low back pain with right-sided sciatica    Chronic pain 10/07/2013   Controlled type 2 diabetes mellitus without complication, without long-term current use of insulin ([place]) 07/12/2014   COPD (chronic obstructive pulmonary disease) ([place]) 01/07/2014   PFTs 07/2018 minimal airway obstruction, overinflation and no response to bronchodilators.    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   Hypogonadism - pineal glad abnormalities On testosterone replacement   Left-sided chest pain 11/27/2018   Nicotine dependence, cigarettes, uncomplicated    Obesity (BMI 30-39.9)    Obesity (BMI 30.0-34.9) 10/03/2017   OSA on CPAP 10/07/2013   Primary osteoarthritis involving multiple joints 01/10/2015   Rosacea    Smoking greater than 30 pack years - quit 03/2018 10/03/2017   Thoracic and lumbosacral neuritis     Past Surgical History:  Procedure Laterality Date   ARTHROSCOPIC REPAIR ACL     SHOULDER ARTHROSCOPY     SPINE SURGERY      Current Medications: Current Meds  Medication Sig   acetaminophen-codeine (TYLENOL #3) 300-30 MG tablet Take 1 tablet by mouth every 6 (six) hours as needed for moderate pain.   albuterol (VENTOLIN HFA) 108 (90 Base) MCG/ACT inhaler INHALE 2 PUFFS BY MOUTH EVERY 4 HOURS AS NEEDED FOR WHEEZING   amLODipine (NORVASC) 10 MG tablet TAKE ONE TABLET BY MOUTH ONE TIME DAILY    aspirin 81 MG EC tablet Take 81 mg by mouth daily.    atorvastatin (LIPITOR) 10 MG tablet Take 1 tablet (10 mg total) by mouth at bedtime.   cyclobenzaprine (FLEXERIL) 10 MG tablet Take 1 tablet (10 mg total) by mouth 3 (three) times daily as needed for muscle spasms.   diclofenac sodium (  VOLTAREN) 1 % GEL PLACE 2 GRAMS ON TO THE SKIN 4 TIMES A DAY AS DIRECTED   finasteride (PROSCAR) 5 MG tablet Take 5 mg by mouth daily.    fluticasone (FLONASE) 50 MCG/ACT nasal spray Place 1 spray into the nose daily as needed.    glipiZIDE (GLUCOTROL XL) 10 MG 24 hr tablet TAKE ONE TABLET BY MOUTH ONE TIME DAILY WITH BREAKFAST   Glycopyrrolate-Formoterol (BEVESPI AEROSPHERE) 9-4.8 MCG/ACT AERO Inhale 2 puffs into the lungs 2 (two) times a day.   hydrochlorothiazide (HYDRODIURIL) 25 MG tablet TAKE ONE TABLET BY MOUTH ONE TIME DAILY    Loratadine (CLARITIN) 10 MG CAPS  Take 10 mg by mouth daily as needed.    losartan (COZAAR) 100 MG tablet Take 1 tablet (100 mg total) by mouth daily.   metFORMIN (GLUCOPHAGE) 500 MG tablet Take 1 tablet (500 mg total) by mouth 2 (two) times daily with a meal.   metoprolol tartrate (LOPRESSOR) 50 MG tablet TAKE ONE TABLET BY MOUTH TWICE DAILY    Multiple Vitamin (MULTIVITAMIN) capsule Take 1 capsule by mouth daily.    neomycin-polymyxin-hydrocortisone (CORTISPORIN) OTIC solution Place 3 drops into the left ear 4 (four) times daily.   tamsulosin (FLOMAX) 0.4 MG CAPS capsule Take 2 capsules (0.8 mg total) by mouth daily.   testosterone cypionate (DEPOTESTOSTERONE CYPIONATE) 200 MG/ML injection inject 1 ml into the muscle every 14 days     Allergies:   Rubus fruticosus   Social History   Socioeconomic History   Marital status: Married    Spouse name: Not on file   Number of children: Not on file   Years of education: Not on file   Highest education level: Not on file  Occupational History   Not on file  Social Needs   Financial resource strain: Not on file   Food insecurity    Worry: Not on file    Inability: Not on file   Transportation needs    Medical: Not on file    Non-medical: Not on file  Tobacco Use   Smoking status: Former Smoker    Packs/day: 0.50    Years: 30.00    Pack years: 15.00    Types: Cigarettes    Quit date: 03/05/2018    Years since quitting: 0.8   Smokeless tobacco: Former Systems developer   Tobacco comment: no cigarettes since 03/05/18  Substance and Sexual Activity   Alcohol use: Not Currently   Drug use: Not Currently   Sexual activity: Not Currently  Lifestyle   Physical activity    Days per week: Not on file    Minutes per session: Not on file   Stress: Not on file  Relationships   Social connections    Talks on phone: Not on file    Gets together: Not on file    Attends religious service: Not on file    Active member of club or organization: Not on file     Attends meetings of clubs or organizations: Not on file    Relationship status: Not on file  Other Topics Concern   Not on file  Social History Narrative   Not on file     Family History: The patient's family history includes Heart disease in his father and maternal uncle; Lung cancer in his father and sister; Pulmonary embolism in his mother. ROS:   Please see the history of present illness.    All other systems reviewed and are negative.  EKGs/Labs/Other Studies Reviewed:  The following studies were reviewed today:  EKG: EKG 11/25/2018 independently reviewed sinus rhythm nonspecific conduction delay right axis deviation  Recent Labs: 04/06/2018: ALT 40; Hemoglobin 16.2; Platelets 217.0; TSH 1.44 12/14/2018: BUN 11; Creatinine, Ser 0.97; Potassium 4.7; Sodium 136  Recent Lipid Panel    Component Value Date/Time   CHOL 149 04/06/2018 0836   TRIG 137.0 04/06/2018 0836   HDL 42.20 04/06/2018 0836   CHOLHDL 4 04/06/2018 0836   VLDL 27.4 04/06/2018 0836   LDLCALC 80 04/06/2018 0836    Physical Exam:    VS:  BP (!) 140/92 (BP Location: Right Arm, Patient Position: Sitting, Cuff Size: Large)    Pulse 71    Temp 98.4 F (36.9 C)    Ht 6\' 2"  (1.88 m)    Wt 258 lb 12.8 oz (117.4 kg)    SpO2 96%    BMI 33.23 kg/m     Wt Readings from Last 3 Encounters:  12/24/18 258 lb 12.8 oz (117.4 kg)  12/01/18 258 lb 1.9 oz (117.1 kg)  11/25/18 257 lb 3.2 oz (116.7 kg)     GEN:  Well nourished, well developed in no acute distress HEENT: Normal NECK: No JVD; No carotid bruits LYMPHATICS: No lymphadenopathy CARDIAC: RRR, no murmurs, rubs, gallops RESPIRATORY:  Clear to auscultation without rales, wheezing or rhonchi  ABDOMEN: Soft, non-tender, non-distended MUSCULOSKELETAL:  No edema; No deformity  SKIN: Warm and dry NEUROLOGIC:  Alert and oriented x 3 PSYCHIATRIC:  Normal affect    Signed, Shirlee More, MD  12/24/2018 10:22 AM    [facility]

## 2018-12-23 NOTE — Progress Notes (Signed)
Cardiology Office Note:    Date:  12/24/2018   ID:  Jason Snyder, DOB 06/09/1950, MRN [id number]  PCP:  [name], MD  Cardiologist:  Shirlee More, MD    Referring MD: [name], MD    ASSESSMENT:    1. Coronary artery disease involving native coronary artery of native heart without angina pectoris   2. Pre-procedure lab exam   3. Essential hypertension   4. Chronic obstructive pulmonary disease, unspecified COPD type ([place])   5. Controlled type 2 diabetes mellitus without complication, without long-term current use of insulin ([place])   6. Mixed hyperlipidemia    PLAN:    In order of problems listed above:  1. He has high risk coronary anatomy left main multivessel CAD on cardiac CTA abnormal fractional flow reserve and in the context of diabetes advised to and agrees coronary angiography and appropriate revascularization.  Options risk and benefits were detailed informed consent obtained continue medical treatment including aspirin and statin 2. Stable hypertension continue current treatment 3. Stable known COPD he has a good quality of life and is not limited.  He has obvious emphysema on CT scan 4. Stable diabetes if additional drug therapy is needed GLP-1 agonist or SGLT2 antagonist is appropriate 5. Continue statin likely needs intensification adding Zetia at his follow-up visit  This was a complex visit with the volume of medical information reviewed with the patient shared decision making and formulating a plan for further evaluation including review of options benefits and risks and obtaining informed consent.  Next appointment: 4 weeks   Medication Adjustments/Labs and Tests Ordered: Current medicines are reviewed at length with the patient today.  Concerns regarding medicines are outlined above.  Orders Placed This Encounter  Procedures   CBC   Basic Metabolic Panel (BMET)   No orders of the defined types were placed in this encounter.   No chief  complaint on file.   History of Present Illness:    Jason Snyder is a 68 y.o. male with a hx of hypertension, COPD and CAC last seen 12/01/2018.  Cardiac CTA:   IMPRESSION: 1. Severe CAD, CADRADS = 4. CT FFR   Right Coronary Artery: Severe proximal to mid RCA mixed atherosclerotic plaque, 70-99% stenosis. Likely serial severe mixed atherosclerotic plaques in the mid RCA 70-99% stenosis. Distal vessel is small caliber but patent, with patent PDA and PL.  Left Main Coronary Artery: Minimal mixed atherosclerotic plaque in the proximal LM, <25% stenosis. Mild mixed atherosclerotic plaque in the distal left main crossing over into the proximal LAD and circumflex, 25-49%.  Left Anterior Descending Coronary Artery: Moderate mixed atherosclerotic plaque in the proximal LAD, 50-69% stenosis. Severe mixed atherosclerotic plaque in the proximal D1, and mid-distal D1, 70-99% stenosis. Moderate atherosclerotic plaque in the mid LAD, 50-69% stenosis.  Left Circumflex Artery: Mild scattered mixed atherosclerotic plaques in the proximal L circumflex, leading to a severe mixed atherosclerotic plaque in the proximal circumflex, 70-99% stenosis. Varying stenosis in this long segment lesions. Severe stenosis in the distal portion of dominant OM.  2. The patient's coronary artery calcium score is 2667, which places the patient in the 98th percentile.  3. Normal coronary origin with right dominance.  4. FFR:CT FFR analysis showed likely severe stenosis in the proximal RCA with possible total occlusion. There is also a severe stenosis in the distal OM and distal D1, which are small caliber vessels  Echo 12/09/2018:  1. The left ventricle has normal systolic function with an ejection  fraction of 60-65%. The cavity size was normal. There is mildly increased left ventricular wall thickness. Left ventricular diastolic Doppler parameters are consistent with impaired  relaxation.  2. The right  ventricle has normal systolic function. The cavity was normal. There is no increase in right ventricular wall thickness. 3. AV is normal, no AS  Compliance with diet, lifestyle and medications: Yes  Jason Snyder is seen in the office in follow-up to review his cardiac CTA and echocardiogram.  Although his aortic valve is calcified he has no valve dysfunction left ventricular function is normal on echo.  His cardiac CTA shows high risk anatomy with multivessel severe coronary artery stenosis.  In the context of diabetes and risks advised to undergo coronary angiography and revascularization and he understands he may require bypass surgery.  The options benefits and risks of coronary angiography and intervention were discussed patient has high healthcare literacy and agrees.  He has no contraindication to dual antiplatelet therapy he is a very compliant man and despite his comorbidities hypertension diabetes hyperlipidemia and COPD has a good quality of life and is very active.  He has had no anginal discomfort. Past Medical History:  Diagnosis Date   Abnormal EKG 11/27/2018   BMI 30.0-30.9,adult    BPH without obstruction/lower urinary tract symptoms 10/03/2017   Chronic left shoulder pain c   Chronic low back pain with right-sided sciatica    Chronic pain 10/07/2013   Controlled type 2 diabetes mellitus without complication, without long-term current use of insulin ([place]) 07/12/2014   COPD (chronic obstructive pulmonary disease) ([place]) 01/07/2014   PFTs 07/2018 minimal airway obstruction, overinflation and no response to bronchodilators.    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   Hypogonadism - pineal glad abnormalities On testosterone replacement   Left-sided chest pain 11/27/2018   Nicotine dependence, cigarettes, uncomplicated    Obesity (BMI 30-39.9)    Obesity (BMI 30.0-34.9) 10/03/2017   OSA on CPAP 10/07/2013   Primary osteoarthritis involving multiple joints 01/10/2015   Rosacea    Smoking greater than 30 pack years - quit 03/2018 10/03/2017   Thoracic and lumbosacral neuritis     Past Surgical History:  Procedure Laterality Date   ARTHROSCOPIC REPAIR ACL     SHOULDER ARTHROSCOPY     SPINE SURGERY      Current Medications: Current Meds  Medication Sig   acetaminophen-codeine (TYLENOL #3) 300-30 MG tablet Take 1 tablet by mouth every 6 (six) hours as needed for moderate pain.   albuterol (VENTOLIN HFA) 108 (90 Base) MCG/ACT inhaler INHALE 2 PUFFS BY MOUTH EVERY 4 HOURS AS NEEDED FOR WHEEZING   amLODipine (NORVASC) 10 MG tablet TAKE ONE TABLET BY MOUTH ONE TIME DAILY    aspirin 81 MG EC tablet Take 81 mg by mouth daily.    atorvastatin (LIPITOR) 10 MG tablet Take 1 tablet (10 mg total) by mouth at bedtime.   cyclobenzaprine (FLEXERIL) 10 MG tablet Take 1 tablet (10 mg total) by mouth 3 (three) times daily as needed for muscle spasms.   diclofenac sodium (  VOLTAREN) 1 % GEL PLACE 2 GRAMS ON TO THE SKIN 4 TIMES A DAY AS DIRECTED   finasteride (PROSCAR) 5 MG tablet Take 5 mg by mouth daily.    fluticasone (FLONASE) 50 MCG/ACT nasal spray Place 1 spray into the nose daily as needed.    glipiZIDE (GLUCOTROL XL) 10 MG 24 hr tablet TAKE ONE TABLET BY MOUTH ONE TIME DAILY WITH BREAKFAST   Glycopyrrolate-Formoterol (BEVESPI AEROSPHERE) 9-4.8 MCG/ACT AERO Inhale 2 puffs into the lungs 2 (two) times a day.   hydrochlorothiazide (HYDRODIURIL) 25 MG tablet TAKE ONE TABLET BY MOUTH ONE TIME DAILY    Loratadine (CLARITIN) 10 MG CAPS  Take 10 mg by mouth daily as needed.    losartan (COZAAR) 100 MG tablet Take 1 tablet (100 mg total) by mouth daily.   metFORMIN (GLUCOPHAGE) 500 MG tablet Take 1 tablet (500 mg total) by mouth 2 (two) times daily with a meal.   metoprolol tartrate (LOPRESSOR) 50 MG tablet TAKE ONE TABLET BY MOUTH TWICE DAILY    Multiple Vitamin (MULTIVITAMIN) capsule Take 1 capsule by mouth daily.    neomycin-polymyxin-hydrocortisone (CORTISPORIN) OTIC solution Place 3 drops into the left ear 4 (four) times daily.   tamsulosin (FLOMAX) 0.4 MG CAPS capsule Take 2 capsules (0.8 mg total) by mouth daily.   testosterone cypionate (DEPOTESTOSTERONE CYPIONATE) 200 MG/ML injection inject 1 ml into the muscle every 14 days     Allergies:   Rubus fruticosus   Social History   Socioeconomic History   Marital status: Married    Spouse name: Not on file   Number of children: Not on file   Years of education: Not on file   Highest education level: Not on file  Occupational History   Not on file  Social Needs   Financial resource strain: Not on file   Food insecurity    Worry: Not on file    Inability: Not on file   Transportation needs    Medical: Not on file    Non-medical: Not on file  Tobacco Use   Smoking status: Former Smoker    Packs/day: 0.50    Years: 30.00    Pack years: 15.00    Types: Cigarettes    Quit date: 03/05/2018    Years since quitting: 0.8   Smokeless tobacco: Former Systems developer   Tobacco comment: no cigarettes since 03/05/18  Substance and Sexual Activity   Alcohol use: Not Currently   Drug use: Not Currently   Sexual activity: Not Currently  Lifestyle   Physical activity    Days per week: Not on file    Minutes per session: Not on file   Stress: Not on file  Relationships   Social connections    Talks on phone: Not on file    Gets together: Not on file    Attends religious service: Not on file    Active member of club or organization: Not on file     Attends meetings of clubs or organizations: Not on file    Relationship status: Not on file  Other Topics Concern   Not on file  Social History Narrative   Not on file     Family History: The patient's family history includes Heart disease in his father and maternal uncle; Lung cancer in his father and sister; Pulmonary embolism in his mother. ROS:   Please see the history of present illness.    All other systems reviewed and are negative.  EKGs/Labs/Other Studies Reviewed:  The following studies were reviewed today:  EKG: EKG 11/25/2018 independently reviewed sinus rhythm nonspecific conduction delay right axis deviation  Recent Labs: 04/06/2018: ALT 40; Hemoglobin 16.2; Platelets 217.0; TSH 1.44 12/14/2018: BUN 11; Creatinine, Ser 0.97; Potassium 4.7; Sodium 136  Recent Lipid Panel    Component Value Date/Time   CHOL 149 04/06/2018 0836   TRIG 137.0 04/06/2018 0836   HDL 42.20 04/06/2018 0836   CHOLHDL 4 04/06/2018 0836   VLDL 27.4 04/06/2018 0836   LDLCALC 80 04/06/2018 0836    Physical Exam:    VS:  BP (!) 140/92 (BP Location: Right Arm, Patient Position: Sitting, Cuff Size: Large)    Pulse 71    Temp 98.4 F (36.9 C)    Ht 6\' 2"  (1.88 m)    Wt 258 lb 12.8 oz (117.4 kg)    SpO2 96%    BMI 33.23 kg/m     Wt Readings from Last 3 Encounters:  12/24/18 258 lb 12.8 oz (117.4 kg)  12/01/18 258 lb 1.9 oz (117.1 kg)  11/25/18 257 lb 3.2 oz (116.7 kg)     GEN:  Well nourished, well developed in no acute distress HEENT: Normal NECK: No JVD; No carotid bruits LYMPHATICS: No lymphadenopathy CARDIAC: RRR, no murmurs, rubs, gallops RESPIRATORY:  Clear to auscultation without rales, wheezing or rhonchi  ABDOMEN: Soft, non-tender, non-distended MUSCULOSKELETAL:  No edema; No deformity  SKIN: Warm and dry NEUROLOGIC:  Alert and oriented x 3 PSYCHIATRIC:  Normal affect    Signed, Shirlee More, MD  12/24/2018 10:22 AM    [facility]

## 2018-12-24 ENCOUNTER — Encounter: Payer: Self-pay | Admitting: Cardiology

## 2018-12-24 ENCOUNTER — Other Ambulatory Visit: Payer: Self-pay

## 2018-12-24 ENCOUNTER — Other Ambulatory Visit: Payer: Self-pay | Admitting: Family Medicine

## 2018-12-24 ENCOUNTER — Ambulatory Visit (INDEPENDENT_AMBULATORY_CARE_PROVIDER_SITE_OTHER): Payer: Medicare Other | Admitting: Cardiology

## 2018-12-24 VITALS — BP 140/92 | HR 71 | Temp 98.4°F | Ht 74.0 in | Wt 258.8 lb

## 2018-12-24 DIAGNOSIS — Z01812 Encounter for preprocedural laboratory examination: Secondary | ICD-10-CM | POA: Diagnosis not present

## 2018-12-24 DIAGNOSIS — J449 Chronic obstructive pulmonary disease, unspecified: Secondary | ICD-10-CM | POA: Diagnosis not present

## 2018-12-24 DIAGNOSIS — I2510 Atherosclerotic heart disease of native coronary artery without angina pectoris: Secondary | ICD-10-CM | POA: Diagnosis not present

## 2018-12-24 DIAGNOSIS — E782 Mixed hyperlipidemia: Secondary | ICD-10-CM

## 2018-12-24 DIAGNOSIS — E119 Type 2 diabetes mellitus without complications: Secondary | ICD-10-CM

## 2018-12-24 DIAGNOSIS — I10 Essential (primary) hypertension: Secondary | ICD-10-CM | POA: Diagnosis not present

## 2018-12-24 NOTE — Patient Instructions (Addendum)
Medication Instructions:  Your physician recommends that you continue on your current medications as directed. Please refer to the Current Medication list given to you today.  If you need a refill on your cardiac medications before your next appointment, please call your pharmacy.   Lab work: Your physician recommends that you return for lab work in: TODAY CBC,BMP  If you have labs (blood work) drawn today and your tests are completely normal, you will receive your results only by: [facility] MyChart Message (if you have MyChart) OR . A paper copy in the mail If you have any lab test that is abnormal or we need to change your treatment, we will call you to review the results.  Testing/Procedures: Your physician has requested that you have a cardiac catheterization. Cardiac catheterization is used to diagnose and/or treat various heart conditions. Doctors may recommend this procedure for a number of different reasons. The most common reason is to evaluate chest pain. Chest pain can be a symptom of coronary artery disease (CAD), and cardiac catheterization can show whether plaque is narrowing or blocking your heart's arteries. This procedure is also used to evaluate the valves, as well as measure the blood flow and oxygen levels in different parts of your heart. For further information please visit [facility]. Please follow instruction sheet, as given.    [facility] [address] [place], [place] [address] Dept: [phone] Loc: [phone]  Jason Snyder  12/24/2018  You are scheduled for a Cardiac Catheterization on Friday, July 31 with Dr. Glenetta Hew.  1. Please arrive at the [facility] (Main Entrance A) at [facility]: [address] [place], [place] [address] at 5:30 AM (This time is two hours before your procedure to ensure your preparation). Free valet parking service is available.    Special note: Every effort is made to have your procedure done on time. Please understand that emergencies sometimes delay scheduled procedures.  2. Diet: Do not eat solid foods after midnight.  The patient may have clear liquids until 5am upon the day of the procedure.  3. Labs: Not needed  4. Medication instructions in preparation for your procedure: Hold Glipizide the morning of the procedure  Stop taking, HTCZ (Hydrochlorothiazide) Friday, July 31,  Do not take Diabetes Med Glucophage (Metformin) on the day of the procedure and HOLD 48 HOURS AFTER THE PROCEDURE.  On the morning of your procedure, take your Aspirin and any morning medicines NOT listed above.  You may use sips of water.  5. Plan for one night stay--bring personal belongings. 6. Bring a current list of your medications and current insurance cards. 7. You MUST have a responsible person to drive you home. 8. Someone MUST be with you the first 24 hours after you arrive home or your discharge will be delayed. 9. Please wear clothes that are easy to get on and off and wear slip-on shoes.  Thank you for allowing [place] to care for you!   -- [facility]  Follow-Up: At [facility], you and your health needs are our priority.  As part of our continuing mission to provide you with exceptional heart care, we have created designated Provider Care Teams.  These Care Teams include your primary Cardiologist (physician) and Advanced Practice Providers (APPs -  Physician Assistants and Nurse Practitioners) who all work together to provide you with the care you need, when you need it. You will  need a follow up appointment in 2 weeks.  Any Other Special Instructions Will Be Listed Below (If Applicable).

## 2018-12-25 LAB — CBC
Hematocrit: 47.3 % (ref 37.5–51.0)
Hemoglobin: 15.8 g/dL (ref 13.0–17.7)
MCH: 32.4 pg (ref 26.6–33.0)
MCHC: 33.4 g/dL (ref 31.5–35.7)
MCV: 97 fL (ref 79–97)
Platelets: 258 10*3/uL (ref 150–450)
RBC: 4.87 x10E6/uL (ref 4.14–5.80)
RDW: 13.3 % (ref 11.6–15.4)
WBC: 5.7 10*3/uL (ref 3.4–10.8)

## 2018-12-25 LAB — BASIC METABOLIC PANEL
BUN/Creatinine Ratio: 10 (ref 10–24)
BUN: 10 mg/dL (ref 8–27)
CO2: 23 mmol/L (ref 20–29)
Calcium: 10 mg/dL (ref 8.6–10.2)
Chloride: 96 mmol/L (ref 96–106)
Creatinine, Ser: 0.96 mg/dL (ref 0.76–1.27)
GFR calc Af Amer: 95 mL/min/{1.73_m2} (ref >59)
GFR calc non Af Amer: 83 mL/min/{1.73_m2} (ref >59)
Glucose: 123 mg/dL — ABNORMAL HIGH (ref 65–99)
Potassium: 4.3 mmol/L (ref 3.5–5.2)
Sodium: 135 mmol/L (ref 134–144)

## 2018-12-25 NOTE — Addendum Note (Signed)
Addended by: Stevan Born on: 12/25/2018 08:03 AM   Modules accepted: Orders

## 2018-12-28 ENCOUNTER — Other Ambulatory Visit (HOSPITAL_COMMUNITY): Payer: Medicare Other

## 2018-12-29 ENCOUNTER — Other Ambulatory Visit (HOSPITAL_COMMUNITY)
Admission: RE | Admit: 2018-12-29 | Discharge: 2018-12-29 | Disposition: A | Discharge status: Home / Self Care | Payer: Medicare Other | Source: Ambulatory Visit | Attending: Cardiology | Admitting: Cardiology

## 2018-12-29 DIAGNOSIS — Z20828 Contact with and (suspected) exposure to other viral communicable diseases: Secondary | ICD-10-CM | POA: Diagnosis not present

## 2018-12-29 DIAGNOSIS — M25512 Pain in left shoulder: Secondary | ICD-10-CM | POA: Diagnosis not present

## 2018-12-29 LAB — SARS CORONAVIRUS 2 (TAT 6-24 HRS): SARS Coronavirus 2: NEGATIVE

## 2018-12-31 ENCOUNTER — Telehealth: Payer: Self-pay | Admitting: *Deleted

## 2018-12-31 NOTE — Telephone Encounter (Signed)
Pt contacted pre-catheterization scheduled at [facility] for: Friday January 01, 2019 7:30 AM Verified arrival time and place: [place] Entrance A at: 5:30 AM  Covid-19 test date: 12/29/18  No solid food after midnight prior to cath, clear liquids until 5 AM day of procedure. Contrast allergy: no  Hold: Glipizide-AM of procedure. HCTZ-AM of procedure Metformin-day of procedure and 48 hours post procedure.  Except hold medications AM meds can be  taken pre-cath with sip of water including: ASA 81 mg   Confirmed patient has responsible person to drive home post procedure and observe 24 hours after arriving home: yes  Due to Covid-19 pandemic, only one support person will be allowed with patient. Must be the same support person for that patient's entire stay, will be screened and required to wear a mask.   Patients are required to wear a mask when they enter the hospital.      COVID-19 Pre-Screening Questions:  . In the past 7 to 10 days have you had a cough,  shortness of breath, headache, congestion, fever (100 or greater) body aches, chills, sore throat, or sudden loss of taste or sense of smell? no . Have you been around anyone with known Covid 19? no . Have you been around anyone who is awaiting Covid 19 test results in the past 7 to 10 days? no . Have you been around anyone who has been exposed to Covid 19, or has mentioned symptoms of Covid 19 within the past 7 to 10 days? no  I reviewed procedure/mask/visitor, Covid-19 screening questions with patient, he verbalized understanding, thanked me for call.

## 2019-01-01 ENCOUNTER — Other Ambulatory Visit: Payer: Self-pay

## 2019-01-01 ENCOUNTER — Ambulatory Visit (HOSPITAL_COMMUNITY)
Admission: RE | Disposition: A | Discharge status: Home / Self Care | Payer: Medicare Other | Source: Ambulatory Visit | Attending: Cardiology

## 2019-01-01 ENCOUNTER — Ambulatory Visit (HOSPITAL_COMMUNITY)
Admission: RE | Admit: 2019-01-01 | Discharge: 2019-01-01 | Disposition: A | Discharge status: Home / Self Care | Payer: Medicare Other | Source: Ambulatory Visit | Attending: Cardiology | Admitting: Cardiology

## 2019-01-01 ENCOUNTER — Encounter (HOSPITAL_COMMUNITY): Payer: Self-pay | Admitting: Cardiology

## 2019-01-01 DIAGNOSIS — R9431 Abnormal electrocardiogram [ECG] [EKG]: Secondary | ICD-10-CM | POA: Insufficient documentation

## 2019-01-01 DIAGNOSIS — K219 Gastro-esophageal reflux disease without esophagitis: Secondary | ICD-10-CM | POA: Insufficient documentation

## 2019-01-01 DIAGNOSIS — I208 Other forms of angina pectoris: Secondary | ICD-10-CM | POA: Diagnosis present

## 2019-01-01 DIAGNOSIS — Z79899 Other long term (current) drug therapy: Secondary | ICD-10-CM | POA: Diagnosis not present

## 2019-01-01 DIAGNOSIS — Z7984 Long term (current) use of oral hypoglycemic drugs: Secondary | ICD-10-CM | POA: Insufficient documentation

## 2019-01-01 DIAGNOSIS — R931 Abnormal findings on diagnostic imaging of heart and coronary circulation: Secondary | ICD-10-CM | POA: Diagnosis present

## 2019-01-01 DIAGNOSIS — I2582 Chronic total occlusion of coronary artery: Secondary | ICD-10-CM | POA: Insufficient documentation

## 2019-01-01 DIAGNOSIS — I25118 Atherosclerotic heart disease of native coronary artery with other forms of angina pectoris: Secondary | ICD-10-CM | POA: Diagnosis not present

## 2019-01-01 DIAGNOSIS — G4733 Obstructive sleep apnea (adult) (pediatric): Secondary | ICD-10-CM | POA: Insufficient documentation

## 2019-01-01 DIAGNOSIS — E669 Obesity, unspecified: Secondary | ICD-10-CM | POA: Insufficient documentation

## 2019-01-01 DIAGNOSIS — Z8249 Family history of ischemic heart disease and other diseases of the circulatory system: Secondary | ICD-10-CM | POA: Insufficient documentation

## 2019-01-01 DIAGNOSIS — I10 Essential (primary) hypertension: Secondary | ICD-10-CM | POA: Diagnosis not present

## 2019-01-01 DIAGNOSIS — E119 Type 2 diabetes mellitus without complications: Secondary | ICD-10-CM | POA: Diagnosis not present

## 2019-01-01 DIAGNOSIS — Z7982 Long term (current) use of aspirin: Secondary | ICD-10-CM | POA: Insufficient documentation

## 2019-01-01 DIAGNOSIS — I25119 Atherosclerotic heart disease of native coronary artery with unspecified angina pectoris: Secondary | ICD-10-CM | POA: Insufficient documentation

## 2019-01-01 DIAGNOSIS — M1990 Unspecified osteoarthritis, unspecified site: Secondary | ICD-10-CM | POA: Insufficient documentation

## 2019-01-01 DIAGNOSIS — J449 Chronic obstructive pulmonary disease, unspecified: Secondary | ICD-10-CM | POA: Diagnosis not present

## 2019-01-01 DIAGNOSIS — N400 Enlarged prostate without lower urinary tract symptoms: Secondary | ICD-10-CM | POA: Insufficient documentation

## 2019-01-01 DIAGNOSIS — I2510 Atherosclerotic heart disease of native coronary artery without angina pectoris: Secondary | ICD-10-CM | POA: Diagnosis present

## 2019-01-01 DIAGNOSIS — Z87891 Personal history of nicotine dependence: Secondary | ICD-10-CM | POA: Insufficient documentation

## 2019-01-01 DIAGNOSIS — E782 Mixed hyperlipidemia: Secondary | ICD-10-CM | POA: Insufficient documentation

## 2019-01-01 DIAGNOSIS — Z6833 Body mass index (BMI) 33.0-33.9, adult: Secondary | ICD-10-CM | POA: Diagnosis not present

## 2019-01-01 DIAGNOSIS — F17210 Nicotine dependence, cigarettes, uncomplicated: Secondary | ICD-10-CM | POA: Diagnosis present

## 2019-01-01 DIAGNOSIS — I2089 Other forms of angina pectoris: Secondary | ICD-10-CM | POA: Diagnosis present

## 2019-01-01 HISTORY — PX: LEFT HEART CATH AND CORONARY ANGIOGRAPHY: CATH118249

## 2019-01-01 HISTORY — PX: INTRAVASCULAR PRESSURE WIRE/FFR STUDY: CATH118243

## 2019-01-01 LAB — GLUCOSE, CAPILLARY
Glucose-Capillary: 120 mg/dL — ABNORMAL HIGH (ref 70–99)
Glucose-Capillary: 113 mg/dL — ABNORMAL HIGH (ref 70–99)

## 2019-01-01 LAB — POCT ACTIVATED CLOTTING TIME: Activated Clotting Time: 323 seconds

## 2019-01-01 SURGERY — LEFT HEART CATH AND CORONARY ANGIOGRAPHY
Anesthesia: LOCAL

## 2019-01-01 MED ORDER — NITROGLYCERIN 1 MG/10 ML FOR IR/CATH LAB
INTRA_ARTERIAL | Status: AC
  Filled 2019-01-01: qty 10

## 2019-01-01 MED ORDER — FENTANYL CITRATE (PF) 100 MCG/2ML IJ SOLN
INTRAMUSCULAR | Status: AC
  Filled 2019-01-01: qty 2

## 2019-01-01 MED ORDER — ASPIRIN 81 MG PO CHEW: 81.0000 mg | CHEWABLE_TABLET | ORAL | Status: DC

## 2019-01-01 MED ORDER — HYDRALAZINE HCL 20 MG/ML IJ SOLN: 10.0000 mg | INTRAMUSCULAR | Status: DC | PRN

## 2019-01-01 MED ORDER — SODIUM CHLORIDE 0.9 % WEIGHT BASED INFUSION: 1.0000 mL/kg/h | INTRAVENOUS | Status: DC

## 2019-01-01 MED ORDER — HEPARIN (PORCINE) IN NACL 1000-0.9 UT/500ML-% IV SOLN
INTRAVENOUS | Status: DC | PRN
  Administered 2019-01-01 (×2): 500 mL

## 2019-01-01 MED ORDER — ADENOSINE 12 MG/4ML IV SOLN
INTRAVENOUS | Status: AC
  Filled 2019-01-01: qty 20

## 2019-01-01 MED ORDER — SODIUM CHLORIDE 0.9% FLUSH: 3.0000 mL | INTRAVENOUS | Status: DC | PRN

## 2019-01-01 MED ORDER — LABETALOL HCL 5 MG/ML IV SOLN: 10.0000 mg | INTRAVENOUS | Status: DC | PRN

## 2019-01-01 MED ORDER — SODIUM CHLORIDE 0.9 % IV SOLN: 250.0000 mL | INTRAVENOUS | Status: DC | PRN

## 2019-01-01 MED ORDER — ADENOSINE (DIAGNOSTIC) 140MCG/KG/MIN
INTRAVENOUS | Status: DC | PRN
  Administered 2019-01-01: 140 ug/kg/min via INTRAVENOUS

## 2019-01-01 MED ORDER — LIDOCAINE HCL (PF) 1 % IJ SOLN
INTRAMUSCULAR | Status: DC | PRN
  Administered 2019-01-01: 2 mL

## 2019-01-01 MED ORDER — ACETAMINOPHEN 325 MG PO TABS: 650.0000 mg | ORAL_TABLET | ORAL | Status: DC | PRN

## 2019-01-01 MED ORDER — SODIUM CHLORIDE 0.9% FLUSH: 3.0000 mL | Freq: Two times a day (BID) | INTRAVENOUS | Status: DC

## 2019-01-01 MED ORDER — MIDAZOLAM HCL 2 MG/2ML IJ SOLN
INTRAMUSCULAR | Status: AC
  Filled 2019-01-01: qty 2

## 2019-01-01 MED ORDER — HEPARIN SODIUM (PORCINE) 1000 UNIT/ML IJ SOLN
INTRAMUSCULAR | Status: DC | PRN
  Administered 2019-01-01 (×2): 6000 [IU] via INTRAVENOUS

## 2019-01-01 MED ORDER — VERAPAMIL HCL 2.5 MG/ML IV SOLN
INTRAVENOUS | Status: DC | PRN
  Administered 2019-01-01: 10 mL via INTRA_ARTERIAL

## 2019-01-01 MED ORDER — HEPARIN SODIUM (PORCINE) 1000 UNIT/ML IJ SOLN
INTRAMUSCULAR | Status: AC
  Filled 2019-01-01: qty 1

## 2019-01-01 MED ORDER — VERAPAMIL HCL 2.5 MG/ML IV SOLN
INTRAVENOUS | Status: AC
  Filled 2019-01-01: qty 2

## 2019-01-01 MED ORDER — SODIUM CHLORIDE 0.9 % IV SOLN: INTRAVENOUS | Status: AC

## 2019-01-01 MED ORDER — MIDAZOLAM HCL 2 MG/2ML IJ SOLN
INTRAMUSCULAR | Status: DC | PRN
  Administered 2019-01-01: 2 mg via INTRAVENOUS

## 2019-01-01 MED ORDER — HEPARIN (PORCINE) IN NACL 1000-0.9 UT/500ML-% IV SOLN
INTRAVENOUS | Status: AC
  Filled 2019-01-01: qty 1000

## 2019-01-01 MED ORDER — IOHEXOL 350 MG/ML SOLN
INTRAVENOUS | Status: DC | PRN
  Administered 2019-01-01: 70 mL via INTRAVENOUS

## 2019-01-01 MED ORDER — FENTANYL CITRATE (PF) 100 MCG/2ML IJ SOLN
INTRAMUSCULAR | Status: DC | PRN
  Administered 2019-01-01: 50 ug via INTRAVENOUS

## 2019-01-01 MED ORDER — SODIUM CHLORIDE 0.9 % WEIGHT BASED INFUSION
3.0000 mL/kg/h | INTRAVENOUS | Status: DC
  Administered 2019-01-01: 3 mL/kg/h via INTRAVENOUS

## 2019-01-01 MED ORDER — LIDOCAINE HCL (PF) 1 % IJ SOLN
INTRAMUSCULAR | Status: AC
  Filled 2019-01-01: qty 30

## 2019-01-01 MED ORDER — ONDANSETRON HCL 4 MG/2ML IJ SOLN: 4.0000 mg | Freq: Four times a day (QID) | INTRAMUSCULAR | Status: DC | PRN

## 2019-01-01 SURGICAL SUPPLY — 14 items
CATH OPTITORQUE TIG 4.0 5F (CATHETERS) ×2 IMPLANT
CATH VISTA GUIDE 6FR XB3.5 (CATHETERS) ×2 IMPLANT
COVER DOME SNAP 22 D (MISCELLANEOUS) ×2 IMPLANT
DEVICE RAD COMP TR BAND LRG (VASCULAR PRODUCTS) ×2 IMPLANT
GLIDESHEATH SLEND A-KIT 6F 22G (SHEATH) ×2 IMPLANT
GUIDEWIRE INQWIRE 1.5J.035X260 (WIRE) ×1 IMPLANT
GUIDEWIRE PRESSURE COMET II (WIRE) ×2 IMPLANT
INQWIRE 1.5J .035X260CM (WIRE) ×2
KIT ESSENTIALS PG (KITS) ×2 IMPLANT
KIT HEART LEFT (KITS) ×2 IMPLANT
PACK CARDIAC CATHETERIZATION (CUSTOM PROCEDURE TRAY) ×2 IMPLANT
SHEATH PROBE COVER 6X72 (BAG) ×2 IMPLANT
TRANSDUCER W/STOPCOCK (MISCELLANEOUS) ×2 IMPLANT
TUBING CIL FLEX 10 FLL-RA (TUBING) ×2 IMPLANT

## 2019-01-01 NOTE — Interval H&P Note (Signed)
History and Physical Interval Note:  01/01/2019 7:05 AM  Jason Snyder  has presented today for surgery, with the diagnosis of Chest pain - abnormal cardiac CT Angiogram.    The various methods of treatment have been discussed with the patient and family. After consideration of risks, benefits and other options for treatment, the patient has consented to  Procedure(s): LEFT HEART CATH AND CORONARY ANGIOGRAPHY (N/A)  PERCUTANEOUS CORONARY INTERVENTION  as a surgical intervention.  The patient's history has been reviewed, patient examined, no change in status, stable for surgery.  I have reviewed the patient's chart and labs.  Questions were answered to the patient's satisfaction.    Cath Lab Visit (complete for each Cath Lab visit)  Clinical Evaluation Leading to the Procedure:   ACS: No.  Non-ACS:    Anginal Classification: CCS III  Anti-ischemic medical therapy: Maximal Therapy (2 or more classes of medications)  Non-Invasive Test Results: High-risk stress test findings: cardiac mortality >3%/year - HIGH RISK FINDING ON CORONARY CT ANGIOGRAM  Prior CABG: No previous CABG   Glenetta Hew

## 2019-01-01 NOTE — Progress Notes (Signed)
Per Dr Ellyn Hack, no cardiac monitoring needed and no cardiac rehab needed

## 2019-01-01 NOTE — Discharge Instructions (Signed)
Drink plenty of fluids over next 48 hours and keep wrist elevated at heart level for 24 hours Hold Metformin for 48 hours  Radial Site Care  This sheet gives you information about how to care for yourself after your procedure. Your health care provider may also give you more specific instructions. If you have problems or questions, contact your health care provider. What can I expect after the procedure? After the procedure, it is common to have:  Bruising and tenderness at the catheter insertion area. Follow these instructions at home: Medicines  Take over-the-counter and prescription medicines only as told by your health care provider. Insertion site care  Follow instructions from your health care provider about how to take care of your insertion site. Make sure you: ? Wash your hands with soap and water before you change your bandage (dressing). If soap and water are not available, use hand sanitizer. ? Remove your dressing as told by your health care provider. In 24-48 hours  Check your insertion site every day for signs of infection. Check for: ? Redness, swelling, or pain. ? Fluid or blood. ? Pus or a bad smell. ? Warmth.  Do not take baths, swim, or use a hot tub until your health care provider approves.  You may shower 24-48 hours after the procedure, or as directed by your health care provider. ? Remove the dressing and gently wash the site with plain soap and water. ? Pat the area dry with a clean towel. ? Do not rub the site. That could cause bleeding.  Do not apply powder or lotion to the site. Activity   For 24 hours after the procedure, or as directed by your health care provider: ? Do not flex or bend the affected arm. ? Do not push or pull heavy objects with the affected arm. ? Do not drive yourself home from the hospital or clinic. You may drive 24 hours after the procedure unless your health care provider tells you not to. ? Do not operate machinery or power  tools.  Do not lift anything that is heavier than 10 lb (4.5 kg), or the limit that you are told, until your health care provider says that it is safe. For 4 days  Ask your health care provider when it is okay to: ? Return to work or school. ? Resume usual physical activities or sports. ? Resume sexual activity. General instructions  If the catheter site starts to bleed, raise your arm and put firm pressure on the site. If the bleeding does not stop, get help right away. This is a medical emergency.  If you went home on the same day as your procedure, a responsible adult should be with you for the first 24 hours after you arrive home.  Keep all follow-up visits as told by your health care provider. This is important. Contact a health care provider if:  You have a fever.  You have redness, swelling, or yellow drainage around your insertion site. Get help right away if:  You have unusual pain at the radial site.  The catheter insertion area swells very fast.  The insertion area is bleeding, and the bleeding does not stop when you hold steady pressure on the area.  Your arm or hand becomes pale, cool, tingly, or numb. These symptoms may represent a serious problem that is an emergency. Do not wait to see if the symptoms will go away. Get medical help right away. Call your local emergency services (911 in  the [place] Do not drive yourself to the hospital. Summary  After the procedure, it is common to have bruising and tenderness at the site.  Follow instructions from your health care provider about how to take care of your radial site wound. Check the wound every day for signs of infection.  Do not lift anything that is heavier than 10 lb (4.5 kg), or the limit that you are told, until your health care provider says that it is safe. This information is not intended to replace advice given to you by your health care provider. Make sure you discuss any questions you have with your  health care provider. Document Released: 06/22/2010 Document Revised: 06/25/2017 Document Reviewed: 06/25/2017 Elsevier Patient Education  2020 [employer].

## 2019-01-02 ENCOUNTER — Other Ambulatory Visit: Payer: Self-pay | Admitting: Family Medicine

## 2019-01-04 MED FILL — Adenosine IV Soln 12 MG/4ML: INTRAVENOUS | Qty: 4 | Status: CN

## 2019-01-04 MED FILL — Adenosine IV Soln 12 MG/4ML: INTRAVENOUS | Qty: 4 | Status: AC

## 2019-01-12 ENCOUNTER — Ambulatory Visit (INDEPENDENT_AMBULATORY_CARE_PROVIDER_SITE_OTHER): Payer: Medicare Other | Admitting: Family Medicine

## 2019-01-12 ENCOUNTER — Other Ambulatory Visit: Payer: Self-pay

## 2019-01-12 ENCOUNTER — Encounter: Payer: Self-pay | Admitting: Family Medicine

## 2019-01-12 VITALS — BP 128/76 | HR 67 | Temp 98.0°F | Resp 16 | Ht 74.0 in | Wt 250.8 lb

## 2019-01-12 DIAGNOSIS — E1140 Type 2 diabetes mellitus with diabetic neuropathy, unspecified: Secondary | ICD-10-CM

## 2019-01-12 DIAGNOSIS — J449 Chronic obstructive pulmonary disease, unspecified: Secondary | ICD-10-CM | POA: Diagnosis not present

## 2019-01-12 DIAGNOSIS — I10 Essential (primary) hypertension: Secondary | ICD-10-CM

## 2019-01-12 DIAGNOSIS — I2510 Atherosclerotic heart disease of native coronary artery without angina pectoris: Secondary | ICD-10-CM | POA: Diagnosis not present

## 2019-01-12 LAB — POCT GLYCOSYLATED HEMOGLOBIN (HGB A1C): Hemoglobin A1C: 7 % — AB (ref 4.0–5.6)

## 2019-01-12 MED ORDER — GABAPENTIN 100 MG PO CAPS: 100.0000 mg | ORAL_CAPSULE | Freq: Every day | ORAL | 5 refills | Status: DC

## 2019-01-12 MED ORDER — FARXIGA 10 MG PO TABS: 10.0000 mg | ORAL_TABLET | Freq: Every day | ORAL | 5 refills | Status: DC

## 2019-01-12 NOTE — Progress Notes (Signed)
Subjective  CC:  Chief Complaint  Patient presents with  . Diabetes  . Hypertension    HPI: Jason Snyder is a 68 y.o. male who presents to the office today for follow up of diabetes, hypertension and problems listed above in the chief complaint.   Diabetic f/u: His diabetic control is reported as Unchanged. Feeling ok. Has been on inexpensive meds up until now due to lack of insurance. Would be nice to change to newer meds for efficacy and heart data.  He denies exertional CP or SOB or symptomatic hypoglycemia. He denies foot sores but c/o nighttime paresthesias. .    Hypertension f/u: Control is good . Pt reports he is doing well. taking medications as instructed, no medication side effects noted, no TIAs, no chest pain on exertion, no dyspnea on exertion, no swelling of ankles.  He denies adverse effects from his BP medications. Compliance with medication is good.    Hyperlipidemia f/u: Patient presents for follow up of lipids. Lipids have been well controlled on meds w/o myalgias or side effects. Has f/u with cards who may add zetia.   CAD: reviewed cath results, high risk anatomy and severe single vessle disease with mild to moderate multi-vessel disease: for medical mgt at this time. Not currently on a nitrate. Has f/u with Dr. Ellyn Hack. Still with occ chest pain.    Assessment  1. Type 2 diabetes mellitus with diabetic neuropathy, without long-term current use of insulin (Jason Snyder)   2. Coronary artery disease involving native coronary artery of native heart without angina pectoris   3. Chronic obstructive pulmonary disease, unspecified COPD type ([place])   4. Essential hypertension      Plan   Diabetes is currently well controlled. Change to farxiga; stop glipizide. Educated on new med. Monitor for low blood pressure; he is on hctz, may need to decrease dose. Add nighttime gabapentin for peripheral neuropathy sxs.   Hypertension is currently very well controlled. See above   Hyperlipidemia f/u:  Controlled. Cards to evaluate need for zetia  CAD: medical mgt for now. Strict control of htn and dm discussed.   Diabetic education: ongoing education regarding chronic disease management for diabetes was given today. We continue to reinforce the ABC's of diabetic management: A1c (<7 or 8 dependent upon patient), tight blood pressure control, and cholesterol management with goal LDL < 100 minimally. We discuss diet strategies, exercise recommendations, medication options and possible side effects. At each visit, we review recommended immunizations and preventive care recommendations for diabetics and stress that good diabetic control can prevent other problems. See below for this patient's data. Hypertension education: ongoing education regarding management of these chronic disease states was given. Management strategies discussed on successive visits include dietary and exercise recommendations, goals of achieving and maintaining IBW, and lifestyle modifications aiming for adequate sleep and minimizing stressors.   Follow up: ove for cpe with labs.  Orders Placed This Encounter  Procedures  . POCT glycosylated hemoglobin (Hb A1C)   Meds ordered this encounter  Medications  . dapagliflozin propanediol (FARXIGA) 10 MG TABS tablet    Sig: Take 10 mg by mouth daily.    Dispense:  30 tablet    Refill:  5  . gabapentin (NEURONTIN) 100 MG capsule    Sig: Take 1-3 capsules (100-300 mg total) by mouth at bedtime.    Dispense:  90 capsule    Refill:  5      I reviewed the patients updated PMH, FH, and SocHx.  Patient  Active Problem List   Diagnosis Date Noted  . Coronary artery disease involving native coronary artery of native heart without angina pectoris     Priority: High  . Smoking greater than 30 pack years - quit 03/2018 10/03/2017    Priority: High  . Type 2 diabetes mellitus with peripheral neuropathy (Jason Snyder) 07/12/2014    Priority: High  . COPD (chronic  obstructive pulmonary disease) (Jason Snyder) 01/07/2014    Priority: High  . Chronic pain 10/07/2013    Priority: High  . OSA on CPAP 10/07/2013    Priority: High  . Essential hypertension 08/31/2010    Priority: High  . Obesity (BMI 30.0-34.9) 10/03/2017    Priority: Medium  . BPH without obstruction/lower urinary tract symptoms 10/03/2017    Priority: Medium  . Gastroesophageal reflux disease 01/10/2015    Priority: Medium  . Primary osteoarthritis involving multiple joints 01/10/2015    Priority: Medium  . 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    Priority: Medium  . Rosacea 10/03/2017    Priority: Low  . 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    Priority: Low  . Encounter for screening for lung cancer 11/09/2018   Immunization History  Administered Date(s) Administered  . Hepatitis B, ped/adol 06/04/2003  . Influenza Split 03/03/2010  . Influenza, High Dose Seasonal PF 03/21/2017, 03/03/2018  . Influenza, Seasonal, Injecte, Preservative Fre 03/23/2014, 04/12/2015  . Influenza,inj,Quad PF,6+ Mos 04/01/2016  . Pneumococcal Conjugate-13 06/06/2018  . Pneumococcal Polysaccharide-23 06/04/2003  . Td 06/04/2003  . Tdap 01/10/2015  . Zoster 09/18/2015   Health Maintenance  Topic Date Due  . INFLUENZA VACCINE  01/02/2019  . OPHTHALMOLOGY EXAM  03/05/2019  . PNA vac Low Risk Adult (2 of 2 - PPSV23) 06/07/2019  . HEMOGLOBIN A1C  07/15/2019  . FOOT EXAM  01/12/2020  . Fecal DNA (Cologuard)  06/25/2021  . TETANUS/TDAP  01/09/2025  . Hepatitis C Screening  Completed  . HIV Screening  Discontinued   Diabetes and HTN Related Lab Review: Lab Results  Component Value Date   HGBA1C 7.0 (A) 01/12/2019   HGBA1C 6.5 (A) 10/12/2018   HGBA1C 7.3 (A) 07/08/2018    Lab Results  Component Value Date   MICROALBUR <0.7 04/06/2018   Lab Results  Component Value Date   CREATININE 0.96 12/24/2018   BUN 10 12/24/2018   NA 135 12/24/2018   K 4.3 12/24/2018   CL 96 12/24/2018   CO2 23 12/24/2018    Lab Results  Component Value Date   CHOL 149 04/06/2018   Lab Results  Component Value Date   HDL 42.20 04/06/2018   Lab Results  Component Value Date   LDLCALC 80 04/06/2018   [place] 68 04/21/2017   Lab Results  Component Value Date   TRIG 137.0 04/06/2018   Lab Results  Component Value Date   CHOLHDL 4 04/06/2018   No results found for: LDLDIRECT The 10-year ASCVD risk score Mikey Bussing DC Jr., et al., 2013) is: 32.2%   Values used to calculate the score:     Age: 89 years     Sex: Male     Is Non-Hispanic African American: No     Diabetic: Yes     Tobacco smoker: Yes     Systolic Blood Pressure: 540 mmHg     Is BP treated: Yes     HDL Cholesterol: 42.2 mg/dL     Total Cholesterol: 149 mg/dL  BP Readings from Last 3 Encounters:  01/12/19 128/76  01/01/19 (!) 148/85  12/24/18 Marland Kitchen)  140/92   Wt Readings from Last 3 Encounters:  01/12/19 250 lb 12.8 oz (113.8 kg)  01/01/19 256 lb (116.1 kg)  12/24/18 258 lb 12.8 oz (117.4 kg)    Allergies: Patient is allergic to rubus fruticosus. Family History: Patient family history includes Heart disease in his father and maternal uncle; Lung cancer in his father and sister; Pulmonary embolism in his mother. Social History:  Patient  reports that he quit smoking about 10 months ago. His smoking use included cigarettes. He has a 15.00 pack-year smoking history. He has quit using smokeless tobacco. He reports previous alcohol use. He reports previous drug use.  Review of Systems: Ophthalmic: negative for eye pain, loss of vision or double vision Cardiovascular: negative for chest pain Respiratory: negative for SOB or persistent cough Gastrointestinal: negative for abdominal pain Genitourinary: negative for dysuria or gross hematuria MSK: negative for foot lesions Neurologic: negative for weakness or gait disturbance Current Meds  Medication Sig  . acetaminophen-codeine (TYLENOL #3) 300-30 MG tablet Take 1 tablet by mouth every  6 (six) hours as needed for moderate pain.  Marland Kitchen albuterol (VENTOLIN HFA) 108 (90 Base) MCG/ACT inhaler INHALE 2 PUFFS BY MOUTH EVERY 4 HOURS AS NEEDED FOR WHEEZING (Patient taking differently: Inhale 2 puffs into the lungs every 4 (four) hours as needed for wheezing or shortness of breath. )  . amLODipine (NORVASC) 10 MG tablet TAKE ONE TABLET BY MOUTH ONE TIME DAILY  (Patient taking differently: Take 10 mg by mouth daily. )  . aspirin 81 MG EC tablet Take 81 mg by mouth daily.   Marland Kitchen atorvastatin (LIPITOR) 10 MG tablet Take 1 tablet (10 mg total) by mouth at bedtime.  . cyclobenzaprine (FLEXERIL) 10 MG tablet Take 1 tablet (10 mg total) by mouth 3 (three) times daily as needed for muscle spasms.  . diclofenac sodium (VOLTAREN) 1 % GEL Apply 2 g topically 4 (four) times daily as needed (pain).   . finasteride (PROSCAR) 5 MG tablet Take 5 mg by mouth daily.   . fluticasone (FLONASE) 50 MCG/ACT nasal spray Place 1 spray into the nose daily as needed for allergies.   . Glycopyrrolate-Formoterol (BEVESPI AEROSPHERE) 9-4.8 MCG/ACT AERO Inhale 2 puffs into the lungs 2 (two) times a day.  . hydrochlorothiazide (HYDRODIURIL) 25 MG tablet TAKE ONE TABLET BY MOUTH ONE TIME DAILY  (Patient taking differently: Take 25 mg by mouth at bedtime. )  . ibuprofen (ADVIL) 200 MG tablet Take 200 mg by mouth 2 (two) times daily as needed for moderate pain.  Marland Kitchen Ketotifen Fumarate (ALLERGY EYE DROPS OP) Place 1 drop into both eyes daily.  . Loratadine (CLARITIN) 10 MG CAPS Take 10 mg by mouth daily as needed (allergies).   . losartan (COZAAR) 100 MG tablet Take 1 tablet (100 mg total) by mouth daily. (Patient taking differently: Take 100 mg by mouth every evening. )  . metFORMIN (GLUCOPHAGE) 500 MG tablet Take 1 tablet (500 mg total) by mouth 2 (two) times daily with a meal.  . metoprolol tartrate (LOPRESSOR) 50 MG tablet TAKE ONE TABLET BY MOUTH TWICE DAILY  (Patient taking differently: Take 50 mg by mouth 2 (two) times daily. )   . Multiple Vitamin (MULTIVITAMIN) capsule Take 1 capsule by mouth daily.   . naproxen sodium (ALEVE) 220 MG tablet Take 220 mg by mouth daily as needed (pain).  Marland Kitchen neomycin-polymyxin-hydrocortisone (CORTISPORIN) OTIC solution Place 3 drops into the left ear 4 (four) times daily. (Patient taking differently: Place 3 drops into both  ears 4 (four) times daily as needed (ear infections). )  . tamsulosin (FLOMAX) 0.4 MG CAPS capsule Take 2 capsules (0.8 mg total) by mouth daily. (Patient taking differently: Take 0.4 mg by mouth 2 (two) times a day. )  . testosterone cypionate (DEPOTESTOSTERONE CYPIONATE) 200 MG/ML injection inject 1 ml into the muscle every 14 days (Patient taking differently: Inject 200 mg into the muscle every 14 (fourteen) days. )  . [DISCONTINUED] glipiZIDE (GLUCOTROL XL) 10 MG 24 hr tablet TAKE ONE TABLET BY MOUTH ONE TIME DAILY WITH BREAKFAST (Patient taking differently: Take 10 mg by mouth daily with breakfast. )    Objective  Vitals: BP 128/76   Pulse 67   Temp 98 F (36.7 C) (Tympanic)   Resp 16   Ht 6\' 2"  (1.88 m)   Wt 250 lb 12.8 oz (113.8 kg)   SpO2 96%   BMI 32.20 kg/m  General: well appearing, no acute distress  Psych:  Alert and oriented, normal mood and affect HEENT:  Normocephalic, atraumatic, moist mucous membranes, supple neck  Cardiovascular:  Nl S1 and S2, RRR without murmur, gallop or rub. no edema Skin:  Warm, no rashes Neurologic:   Mental status is normal. normal gait Foot exam: no erythema, pallor, or cyanosis visible nl proprioception and sensation to monofilament testing bilaterally, +2 distal pulses bilaterally   Commons side effects, risks, benefits, and alternatives for medications and treatment plan prescribed today were discussed, and the patient expressed understanding of the given instructions. Patient is instructed to call or message via MyChart if he/she has any questions or concerns regarding our treatment plan. No barriers to  understanding were identified. We discussed Red Flag symptoms and signs in detail. Patient expressed understanding regarding what to do in case of urgent or emergency type symptoms.   Medication list was reconciled, printed and provided to the patient in AVS. Patient instructions and summary information was reviewed with the patient as documented in the AVS. This note was prepared with assistance of Dragon voice recognition software. Occasional wrong-word or sound-a-like substitutions may have occurred due to the inherent limitations of voice recognition software

## 2019-01-12 NOTE — Patient Instructions (Signed)
Please return in November for your annual complete physical; please come fasting.  We will stop the glipizide and start [place] for your diabetes and to prevent CHF.  If you have any questions or concerns, please don't hesitate to send me a message via MyChart or call the office at [phone]. Thank you for visiting with [place] today! It's our pleasure caring for you.  Dapagliflozin tablets What is this medicine? DAPAGLIFLOZIN (DAP a gli FLOE zin) helps to treat type 2 diabetes. It helps to control blood sugar. Treatment is combined with diet and exercise. This drug may also be used to reduce the risk of going to the hospital for heart failure if you have type 2 diabetes and risk factors for heart disease. This medicine may be used for other purposes; ask your health care provider or pharmacist if you have questions. COMMON BRAND NAME(S): Wilder Glade What should I tell my health care provider before I take this medicine? They need to know if you have any of these conditions:  dehydration  diabetic ketoacidosis  diet low in salt  eating less due to illness, surgery, dieting, or any other reason  having surgery  history of pancreatitis or pancreas problems  history of yeast infection of the penis or vagina  if you often drink alcohol  infections in the bladder, kidneys, or urinary tract  kidney disease  low blood pressure  on hemodialysis  problems urinating  type 1 diabetes  uncircumcised male  an unusual or allergic reaction to dapagliflozin, other medicines, foods, dyes, or preservatives  pregnant or trying to get pregnant  breast-feeding How should I use this medicine? Take this medicine by mouth with a glass of water. Follow the directions on the prescription label. You can take it with or without food. If it upsets your stomach, take it with food. Take this medicine in the morning. Take your dose at the same time each day. Do not take more often than directed. Do not  stop taking except on your doctor's advice. A special MedGuide will be given to you by the pharmacist with each prescription and refill. Be sure to read this information carefully each time. Talk to your pediatrician regarding the use of this medicine in children. Special care may be needed. Overdosage: If you think you have taken too much of this medicine contact a poison control center or emergency room at once. NOTE: This medicine is only for you. Do not share this medicine with others. What if I miss a dose? If you miss a dose, take it as soon as you can. If it is almost time for your next dose, take only that dose. Do not take double or extra doses. What may interact with this medicine? Do not take this medicine with any of the following medications:  gatifloxacin This medicine may also interact with the following medications:  alcohol  certain medicines for blood pressure, heart disease  diuretics  insulin  nateglinide  pioglitazone  quinolone antibiotics like ciprofloxacin, levofloxacin, ofloxacin  repaglinide  some herbal dietary supplements  steroid medicines like prednisone or cortisone  sulfonylureas like glimepiride, glipizide, glyburide  thyroid medicine This list may not describe all possible interactions. Give your health care provider a list of all the medicines, herbs, non-prescription drugs, or dietary supplements you use. Also tell them if you smoke, drink alcohol, or use illegal drugs. Some items may interact with your medicine. What should I watch for while using this medicine? Visit your doctor or health care professional  for regular checks on your progress. This medicine can cause a serious condition in which there is too much acid in the blood. If you develop nausea, vomiting, stomach pain, unusual tiredness, or breathing problems, stop taking this medicine and call your doctor right away. If possible, use a ketone dipstick to check for ketones in your  urine. A test called the HbA1C (A1C) will be monitored. This is a simple blood test. It measures your blood sugar control over the last 2 to 3 months. You will receive this test every 3 to 6 months. Learn how to check your blood sugar. Learn the symptoms of low and high blood sugar and how to manage them. Always carry a quick-source of sugar with you in case you have symptoms of low blood sugar. Examples include hard sugar candy or glucose tablets. Make sure others know that you can choke if you eat or drink when you develop serious symptoms of low blood sugar, such as seizures or unconsciousness. They must get medical help at once. Tell your doctor or health care professional if you have high blood sugar. You might need to change the dose of your medicine. If you are sick or exercising more than usual, you might need to change the dose of your medicine. Do not skip meals. Ask your doctor or health care professional if you should avoid alcohol. Many nonprescription cough and cold products contain sugar or alcohol. These can affect blood sugar. Wear a medical ID bracelet or chain, and carry a card that describes your disease and details of your medicine and dosage times. What side effects may I notice from receiving this medicine? Side effects that you should report to your doctor or health care professional as soon as possible:  allergic reactions like skin rash, itching or hives, swelling of the face, lips, or tongue  breathing problems  dizziness  feeling faint or lightheaded, falls  muscle weakness  nausea, vomiting, unusual stomach upset or pain  new pain or tenderness, change in skin color, sores or ulcers, or infection in legs or feet  penile discharge, itching, or pain in men  signs and symptoms of a genital infection, such as fever; tenderness, redness, or swelling in the genitals or area from the genitals to the back of the rectum  signs and symptoms of low blood sugar such as  feeling anxious, confusion, dizziness, increased hunger, unusually weak or tired, sweating, shakiness, cold, irritable, headache, blurred vision, fast heartbeat, loss of consciousness  signs and symptoms of a urinary tract infection, such as fever, chills, a burning feeling when urinating, blood in the urine, back pain  trouble passing urine or change in the amount of urine, including an urgent need to urinate more often, in larger amounts, or at night  unusual tiredness  vaginal discharge, itching, or odor in women Side effects that usually do not require medical attention (report to your doctor or health care professional if they continue or are bothersome):  mild increase in urination  thirsty This list may not describe all possible side effects. Call your doctor for medical advice about side effects. You may report side effects to FDA at 1-800-FDA-1088. Where should I keep my medicine? Keep out of the reach of children. Store at room temperature between 15 and 30 degrees C (59 and 86 degrees F). Throw away any unused medicine after the expiration date. NOTE: This sheet is a summary. It may not cover all possible information. If you have questions about this medicine,  talk to your doctor, pharmacist, or health care provider.  2020 Elsevier/Gold Standard (2018-04-03 15:24:42)

## 2019-01-15 ENCOUNTER — Other Ambulatory Visit: Payer: Self-pay | Admitting: Family Medicine

## 2019-01-15 ENCOUNTER — Other Ambulatory Visit: Payer: Self-pay | Admitting: *Deleted

## 2019-01-18 MED ORDER — ACETAMINOPHEN-CODEINE #3 300-30 MG PO TABS: 1.0000 | ORAL_TABLET | Freq: Four times a day (QID) | ORAL | 1 refills | Status: DC | PRN

## 2019-01-19 ENCOUNTER — Ambulatory Visit: Payer: Medicare Other | Admitting: Cardiology

## 2019-01-25 ENCOUNTER — Encounter: Payer: Self-pay | Admitting: Family Medicine

## 2019-02-01 ENCOUNTER — Ambulatory Visit (INDEPENDENT_AMBULATORY_CARE_PROVIDER_SITE_OTHER): Payer: Medicare Other | Admitting: Cardiology

## 2019-02-01 ENCOUNTER — Other Ambulatory Visit: Payer: Self-pay

## 2019-02-01 ENCOUNTER — Encounter: Payer: Self-pay | Admitting: Cardiology

## 2019-02-01 VITALS — BP 138/82 | HR 66 | Ht 74.0 in | Wt 253.1 lb

## 2019-02-01 DIAGNOSIS — I2510 Atherosclerotic heart disease of native coronary artery without angina pectoris: Secondary | ICD-10-CM | POA: Diagnosis not present

## 2019-02-01 DIAGNOSIS — I10 Essential (primary) hypertension: Secondary | ICD-10-CM

## 2019-02-01 DIAGNOSIS — E119 Type 2 diabetes mellitus without complications: Secondary | ICD-10-CM | POA: Diagnosis not present

## 2019-02-01 DIAGNOSIS — E782 Mixed hyperlipidemia: Secondary | ICD-10-CM | POA: Diagnosis not present

## 2019-02-01 MED ORDER — NITROGLYCERIN 0.4 MG SL SUBL: 0.4000 mg | SUBLINGUAL_TABLET | SUBLINGUAL | 11 refills | Status: DC | PRN

## 2019-02-01 NOTE — Patient Instructions (Addendum)
Medication Instructions:  Your physician has recommended you make the following change in your medication:   STOP hydrochlorothiazide  START nitroglycerin as needed for chest pain: When having chest pain, stop what you are doing and sit down. Take 1 nitro, wait 5 minutes. Still having chest pain, take 1 nitro, wait 5 minutes. Still having chest pain, take 1 nitro, dial 911. Total of 3 nitro in 15 minutes.  If you need a refill on your cardiac medications before your next appointment, please call your pharmacy.   Lab work: None  If you have labs (blood work) drawn today and your tests are completely normal, you will receive your results only by: Marland Kitchen MyChart Message (if you have MyChart) OR . A paper copy in the mail If you have any lab test that is abnormal or we need to change your treatment, we will call you to review the results.  Testing/Procedures: None  Follow-Up: At [facility], you and your health needs are our priority.  As part of our continuing mission to provide you with exceptional heart care, we have created designated Provider Care Teams.  These Care Teams include your primary Cardiologist (physician) and Advanced Practice Providers (APPs -  Physician Assistants and Nurse Practitioners) who all work together to provide you with the care you need, when you need it. You will need a follow up appointment in 1 years.  Please call our office 2 months in advance to schedule this appointment.

## 2019-02-01 NOTE — Progress Notes (Signed)
Cardiology Office Note:    Date:  02/01/2019   ID:  Raihan Deragon, DOB 01/29/1951, MRN [id number]  PCP:  [name], MD  Cardiologist:  Shirlee More, MD    Referring MD: [name], MD    ASSESSMENT:    1. Coronary artery disease involving native coronary artery of native heart without angina pectoris   2. Controlled type 2 diabetes mellitus without complication, without long-term current use of insulin ([place])   3. Essential hypertension   4. Mixed hyperlipidemia    PLAN:    In order of problems listed above:  1. Stable CAD medical therapy aspirin continue his current treatment including aspirin antianginals beta-blocker calcium channel blocker and his high intensity statin.  I reviewed with him the data on his diabetic treatment having marked cardioprotective effect in the absence of worsening angina [place] my office 1 year 2. Stable he is in good cardioprotective diabetic treatment 3. Stable asked him to stop his thiazide diuretic because of the natruretic effect of his diabetic medication 4. Continue his high intensity statin labs are followed in his PCP office   Next appointment: 1 year   Medication Adjustments/Labs and Tests Ordered: Current medicines are reviewed at length with the patient today.  Concerns regarding medicines are outlined above.  No orders of the defined types were placed in this encounter.  No orders of the defined types were placed in this encounter.   Chief Complaint  Patient presents with  . Coronary Artery Disease    History of Present Illness:    Jason Snyder is a 68 y.o. male with a hx of hypertension, COPD and CAD  last seen 12/24/2018.Marland Kitchen Compliance with diet, lifestyle and medications: Yes  He is reassured by the results of his heart catheterization showing occlusion of the distal right coronary and other lesions are not flow restrictive and does not require revascularization.  He worked very hard in the garden 1 day and when he  pushed himself to an extreme degree he had brief anginal discomfort we discussed nitroglycerin will be given a prescription recently put on SGOT 1 agent his diuretic dose was diminished he has had some lightheadedness and tell him to stop his hydrochlorothiazide no shortness of breath palpitation or syncope  Left heart cath 01/01/2019: Conclusion   Prox RCA to Dist RCA lesion is 100% stenosed with 100% stenosed side branch in RPAV.  Prox Cx lesion is 65% stenosed -- FFR Negative (0.93)  Dist Cx lesion is 55% stenosed with 70% stenosed side branch in 4th Mrg.  1st Diag lesion is 60% stenosed.  LV end diastolic pressure is normal.   SUMMARY  Severe single-vessel disease with additional moderate two-vessel disease: 100% CTO of RCA, moderate disease in proximal LCx, distal LCx and very small caliber 1st Diag  DFR-FFR negative lesion in proximal LCx  Normal LV pressures with previously documented normal EF.   RECOMMENDATIONS  Discharge home after bedrest  TR band removal per post PCI protocol given additional heparin administration  Medical management for CTO of RCA and moderate disease elsewhere.    Past Medical History:  Diagnosis Date  . Abnormal EKG 11/27/2018  . BMI 30.0-30.9,adult   . BPH without obstruction/lower urinary tract symptoms 10/03/2017  . Chronic left shoulder pain c  . Chronic low back pain with right-sided sciatica   . Chronic pain 10/07/2013  . Controlled type 2 diabetes mellitus without complication, without long-term current use of insulin ([place]) 07/12/2014  . COPD (chronic obstructive pulmonary  disease) ([place]) 01/07/2014   PFTs 07/2018 minimal airway obstruction, overinflation and no response to bronchodilators.   . DDD (degenerative disc disease), lumbar   . Diabetes mellitus without complication ([place])   . 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 Encounter for screening for lung cancer 11/09/2018   Chest CT: neg lung cancer: + COPD changes and cardiovascular  calcifications  . Essential hypertension 08/31/2010   Elevated ASCVD risk - started lipitor 04/2013  . Gastroesophageal reflux disease 01/10/2015  . History of lumbar surgery   . Hyperlipidemia   . Hypertension   . 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 Left-sided chest pain 11/27/2018  . Nicotine dependence, cigarettes, uncomplicated   . Obesity (BMI 30-39.9)   . Obesity (BMI 30.0-34.9) 10/03/2017  . OSA on CPAP 10/07/2013  . Primary osteoarthritis involving multiple joints 01/10/2015  . Rosacea   . Smoking greater than 30 pack years - quit 03/2018 10/03/2017  . Thoracic and lumbosacral neuritis     Past Surgical History:  Procedure Laterality Date  . ARTHROSCOPIC REPAIR ACL    . CARDIAC CATHETERIZATION    . INTRAVASCULAR PRESSURE WIRE/FFR STUDY N/A 01/01/2019   Procedure: INTRAVASCULAR PRESSURE WIRE/FFR STUDY;  Surgeon: Leonie Man, MD;  Location: [facility];  Service: Cardiovascular;  Laterality: N/A;  . LEFT HEART CATH AND CORONARY ANGIOGRAPHY N/A 01/01/2019   Procedure: LEFT HEART CATH AND CORONARY ANGIOGRAPHY;  Surgeon: Leonie Man, MD;  Location: [facility];  Service: Cardiovascular;  Laterality: N/A;  . SHOULDER ARTHROSCOPY    . SPINE SURGERY      Current Medications: Current Meds  Medication Sig  . acetaminophen-codeine (TYLENOL #3) 300-30 MG tablet Take 1 tablet by mouth every 6 (six) hours as needed for moderate pain.  Marland Kitchen albuterol (VENTOLIN HFA) 108 (90 Base) MCG/ACT inhaler INHALE 2 PUFFS BY MOUTH EVERY 4 HOURS AS NEEDED FOR WHEEZING (Patient taking differently: Inhale 2 puffs into the lungs every 4 (four) hours as needed for wheezing or shortness of breath. )  . amLODipine (NORVASC) 10 MG tablet TAKE ONE TABLET BY MOUTH ONE TIME DAILY  (Patient taking differently: Take 10 mg by mouth daily. )  . aspirin 81 MG EC tablet Take 81 mg by mouth daily.   Marland Kitchen atorvastatin (LIPITOR) 10 MG tablet Take  1 tablet (10 mg total) by mouth at bedtime.  . cyclobenzaprine (FLEXERIL) 10 MG tablet Take 1 tablet (10 mg total) by mouth 3 (three) times daily as needed for muscle spasms.  . dapagliflozin propanediol (FARXIGA) 10 MG TABS tablet Take 10 mg by mouth daily.  . diclofenac sodium (VOLTAREN) 1 % GEL Apply 2 g topically 4 (four) times daily as needed (pain).   . finasteride (PROSCAR) 5 MG tablet Take 5 mg by mouth daily.   . fluticasone (FLONASE) 50 MCG/ACT nasal spray Place 1 spray into the nose daily as needed for allergies.   Marland Kitchen gabapentin (NEURONTIN) 100 MG capsule Take 1-3 capsules (100-300 mg total) by mouth at bedtime.  . Glycopyrrolate-Formoterol (BEVESPI AEROSPHERE) 9-4.8 MCG/ACT AERO Inhale 2 puffs into the lungs 2 (two) times a day.  . hydrochlorothiazide (HYDRODIURIL) 25 MG tablet Take 12.5 mg by mouth daily.  Marland Kitchen ibuprofen (ADVIL) 200 MG tablet Take 200 mg by mouth 2 (two) times daily as needed for moderate pain.  Marland Kitchen Ketotifen Fumarate (ALLERGY EYE DROPS OP) Place 1 drop into both eyes daily.  . Loratadine (CLARITIN) 10 MG CAPS Take  10 mg by mouth daily as needed (allergies).   . losartan (COZAAR) 100 MG tablet TAKE ONE TABLET BY MOUTH ONE TIME DAILY   . metFORMIN (GLUCOPHAGE) 500 MG tablet Take 1 tablet (500 mg total) by mouth 2 (two) times daily with a meal.  . metoprolol tartrate (LOPRESSOR) 50 MG tablet TAKE ONE TABLET BY MOUTH TWICE DAILY  (Patient taking differently: Take 50 mg by mouth 2 (two) times daily. )  . Multiple Vitamin (MULTIVITAMIN) capsule Take 1 capsule by mouth daily.   . naproxen sodium (ALEVE) 220 MG tablet Take 220 mg by mouth daily as needed (pain).  Marland Kitchen neomycin-polymyxin-hydrocortisone (CORTISPORIN) OTIC solution Place 3 drops into the left ear 4 (four) times daily. (Patient taking differently: Place 3 drops into both ears 4 (four) times daily as needed (ear infections). )  . tamsulosin (FLOMAX) 0.4 MG CAPS capsule Take 2 capsules (0.8 mg total) by mouth daily.  (Patient taking differently: Take 0.4 mg by mouth 2 (two) times a day. )  . testosterone cypionate (DEPOTESTOSTERONE CYPIONATE) 200 MG/ML injection inject 1 ml into the muscle every 14 days (Patient taking differently: Inject 200 mg into the muscle every 14 (fourteen) days. )     Allergies:   Rubus fruticosus   Social History   Socioeconomic History  . Marital status: Married    Spouse name: Not on file  . Number of children: Not on file  . Years of education: Not on file  . Highest education level: Not on file  Occupational History  . Not on file  Social Needs  . Financial resource strain: Not on file  . Food insecurity    Worry: Not on file    Inability: Not on file  . Transportation needs    Medical: Not on file    Non-medical: Not on file  Tobacco Use  . Smoking status: Former Smoker    Packs/day: 0.50    Years: 30.00    Pack years: 15.00    Types: Cigarettes    Quit date: 03/05/2018    Years since quitting: 0.9  . Smokeless tobacco: Never Used  . Tobacco comment: no cigarettes since 03/05/18  Substance and Sexual Activity  . Alcohol use: Not Currently  . Drug use: Not Currently  . Sexual activity: Not Currently  Lifestyle  . Physical activity    Days per week: Not on file    Minutes per session: Not on file  . Stress: Not on file  Relationships  . Social Herbalist on phone: Not on file    Gets together: Not on file    Attends religious service: Not on file    Active member of club or organization: Not on file    Attends meetings of clubs or organizations: Not on file    Relationship status: Not on file  Other Topics Concern  . Not on file  Social History Narrative  . Not on file     Family History: The patient's family history includes Heart disease in his father and maternal uncle; Lung cancer in his father and sister; Pulmonary embolism in his mother. ROS:   Please see the history of present illness.    All other systems reviewed and are  negative.  EKGs/Labs/Other Studies Reviewed:    The following studies were reviewed today:   Recent Labs: 04/06/2018: ALT 40; TSH 1.44 12/24/2018: BUN 10; Creatinine, Ser 0.96; Hemoglobin 15.8; Platelets 258; Potassium 4.3; Sodium 135  Recent Lipid Panel  Component Value Date/Time   CHOL 149 04/06/2018 0836   TRIG 137.0 04/06/2018 0836   HDL 42.20 04/06/2018 0836   CHOLHDL 4 04/06/2018 0836   VLDL 27.4 04/06/2018 0836   LDLCALC 80 04/06/2018 0836    Physical Exam:    VS:  BP 138/82 (BP Location: Right Arm, Patient Position: Sitting, Cuff Size: Normal)   Pulse 66   Ht 6\' 2"  (1.88 m)   Wt 253 lb 1.9 oz (114.8 kg)   SpO2 94%   BMI 32.50 kg/m     Wt Readings from Last 3 Encounters:  02/01/19 253 lb 1.9 oz (114.8 kg)  01/12/19 250 lb 12.8 oz (113.8 kg)  01/01/19 256 lb (116.1 kg)     GEN:  Well nourished, well developed in no acute distress HEENT: Normal NECK: No JVD; No carotid bruits LYMPHATICS: No lymphadenopathy CARDIAC: RRR, no murmurs, rubs, gallops RESPIRATORY:  Clear to auscultation without rales, wheezing or rhonchi  ABDOMEN: Soft, non-tender, non-distended MUSCULOSKELETAL:  No edema; No deformity  SKIN: Warm and dry NEUROLOGIC:  Alert and oriented x 3 PSYCHIATRIC:  Normal affect    Signed, Shirlee More, MD  02/01/2019 8:39 AM    [place]

## 2019-02-02 ENCOUNTER — Encounter: Payer: Self-pay | Admitting: Family Medicine

## 2019-02-02 ENCOUNTER — Ambulatory Visit (INDEPENDENT_AMBULATORY_CARE_PROVIDER_SITE_OTHER): Payer: Medicare Other

## 2019-02-02 DIAGNOSIS — Z23 Encounter for immunization: Secondary | ICD-10-CM | POA: Diagnosis not present

## 2019-02-16 DIAGNOSIS — N401 Enlarged prostate with lower urinary tract symptoms: Secondary | ICD-10-CM | POA: Diagnosis not present

## 2019-02-16 DIAGNOSIS — R3914 Feeling of incomplete bladder emptying: Secondary | ICD-10-CM | POA: Diagnosis not present

## 2019-02-24 DIAGNOSIS — M461 Sacroiliitis, not elsewhere classified: Secondary | ICD-10-CM | POA: Diagnosis not present

## 2019-03-02 DIAGNOSIS — R351 Nocturia: Secondary | ICD-10-CM | POA: Diagnosis not present

## 2019-03-02 DIAGNOSIS — N401 Enlarged prostate with lower urinary tract symptoms: Secondary | ICD-10-CM | POA: Diagnosis not present

## 2019-03-02 DIAGNOSIS — R3914 Feeling of incomplete bladder emptying: Secondary | ICD-10-CM | POA: Diagnosis not present

## 2019-03-02 DIAGNOSIS — R3912 Poor urinary stream: Secondary | ICD-10-CM | POA: Diagnosis not present

## 2019-03-02 DIAGNOSIS — E349 Endocrine disorder, unspecified: Secondary | ICD-10-CM | POA: Diagnosis not present

## 2019-03-03 ENCOUNTER — Other Ambulatory Visit: Payer: Self-pay | Admitting: *Deleted

## 2019-03-03 LAB — CBC AND DIFFERENTIAL
HCT: 49 (ref 41–53)
Hemoglobin: 16.5 (ref 13.5–17.5)

## 2019-03-03 LAB — PSA: PSA: 1.06

## 2019-03-03 MED ORDER — FARXIGA 10 MG PO TABS: 10.0000 mg | ORAL_TABLET | Freq: Every day | ORAL | 3 refills | Status: DC

## 2019-03-09 ENCOUNTER — Encounter: Payer: Self-pay | Admitting: *Deleted

## 2019-03-09 ENCOUNTER — Encounter: Payer: Self-pay | Admitting: Family Medicine

## 2019-03-18 DIAGNOSIS — R351 Nocturia: Secondary | ICD-10-CM | POA: Diagnosis not present

## 2019-03-18 DIAGNOSIS — R3914 Feeling of incomplete bladder emptying: Secondary | ICD-10-CM | POA: Diagnosis not present

## 2019-03-18 DIAGNOSIS — N401 Enlarged prostate with lower urinary tract symptoms: Secondary | ICD-10-CM | POA: Diagnosis not present

## 2019-03-18 DIAGNOSIS — R3912 Poor urinary stream: Secondary | ICD-10-CM | POA: Diagnosis not present

## 2019-03-22 ENCOUNTER — Other Ambulatory Visit: Payer: Self-pay | Admitting: Family Medicine

## 2019-03-22 ENCOUNTER — Other Ambulatory Visit: Payer: Self-pay | Admitting: *Deleted

## 2019-03-22 MED ORDER — TESTOSTERONE CYPIONATE 200 MG/ML IM SOLN: INTRAMUSCULAR | 3 refills | Status: DC

## 2019-03-22 NOTE — Telephone Encounter (Signed)
Due for recheck in November.

## 2019-03-24 ENCOUNTER — Other Ambulatory Visit: Payer: Self-pay | Admitting: *Deleted

## 2019-03-24 ENCOUNTER — Other Ambulatory Visit: Payer: Self-pay | Admitting: Family Medicine

## 2019-03-24 NOTE — Telephone Encounter (Signed)
Pt called to check status of refill.  

## 2019-03-25 ENCOUNTER — Encounter: Payer: Self-pay | Admitting: Family Medicine

## 2019-04-05 DIAGNOSIS — N401 Enlarged prostate with lower urinary tract symptoms: Secondary | ICD-10-CM | POA: Diagnosis not present

## 2019-04-05 DIAGNOSIS — R3912 Poor urinary stream: Secondary | ICD-10-CM | POA: Diagnosis not present

## 2019-04-05 DIAGNOSIS — R3914 Feeling of incomplete bladder emptying: Secondary | ICD-10-CM | POA: Diagnosis not present

## 2019-04-08 ENCOUNTER — Encounter: Payer: Self-pay | Admitting: Family Medicine

## 2019-04-08 ENCOUNTER — Ambulatory Visit (INDEPENDENT_AMBULATORY_CARE_PROVIDER_SITE_OTHER): Payer: Medicare Other | Admitting: Family Medicine

## 2019-04-08 ENCOUNTER — Ambulatory Visit (INDEPENDENT_AMBULATORY_CARE_PROVIDER_SITE_OTHER): Payer: Medicare Other

## 2019-04-08 ENCOUNTER — Other Ambulatory Visit: Payer: Self-pay

## 2019-04-08 VITALS — BP 136/78 | HR 70 | Temp 98.5°F | Ht 74.0 in | Wt 245.0 lb

## 2019-04-08 VITALS — BP 136/78 | Temp 98.5°F | Ht 74.0 in | Wt 244.9 lb

## 2019-04-08 DIAGNOSIS — G894 Chronic pain syndrome: Secondary | ICD-10-CM

## 2019-04-08 DIAGNOSIS — E1142 Type 2 diabetes mellitus with diabetic polyneuropathy: Secondary | ICD-10-CM

## 2019-04-08 DIAGNOSIS — N138 Other obstructive and reflux uropathy: Secondary | ICD-10-CM | POA: Diagnosis not present

## 2019-04-08 DIAGNOSIS — E291 Testicular hypofunction: Secondary | ICD-10-CM | POA: Diagnosis not present

## 2019-04-08 DIAGNOSIS — I2510 Atherosclerotic heart disease of native coronary artery without angina pectoris: Secondary | ICD-10-CM | POA: Diagnosis not present

## 2019-04-08 DIAGNOSIS — Z0000 Encounter for general adult medical examination without abnormal findings: Secondary | ICD-10-CM

## 2019-04-08 DIAGNOSIS — J449 Chronic obstructive pulmonary disease, unspecified: Secondary | ICD-10-CM

## 2019-04-08 DIAGNOSIS — I10 Essential (primary) hypertension: Secondary | ICD-10-CM

## 2019-04-08 DIAGNOSIS — N401 Enlarged prostate with lower urinary tract symptoms: Secondary | ICD-10-CM | POA: Diagnosis not present

## 2019-04-08 LAB — LIPID PANEL
Cholesterol: 138 mg/dL (ref 0–200)
HDL: 38.6 mg/dL — ABNORMAL LOW (ref >39.00)
LDL Cholesterol: 78 mg/dL (ref 0–99)
NonHDL: 99.2
Total CHOL/HDL Ratio: 4
Triglycerides: 106 mg/dL (ref 0.0–149.0)
VLDL: 21.2 mg/dL (ref 0.0–40.0)

## 2019-04-08 LAB — CBC WITH DIFFERENTIAL/PLATELET
Basophils Absolute: 0 10*3/uL (ref 0.0–0.1)
Basophils Relative: 0.5 % (ref 0.0–3.0)
Eosinophils Absolute: 0.1 10*3/uL (ref 0.0–0.7)
Eosinophils Relative: 1.7 % (ref 0.0–5.0)
HCT: 49.6 % (ref 39.0–52.0)
Hemoglobin: 16.5 g/dL (ref 13.0–17.0)
Lymphocytes Relative: 23.7 % (ref 12.0–46.0)
Lymphs Abs: 1.4 10*3/uL (ref 0.7–4.0)
MCHC: 33.3 g/dL (ref 30.0–36.0)
MCV: 98.3 fl (ref 78.0–100.0)
Monocytes Absolute: 0.5 10*3/uL (ref 0.1–1.0)
Monocytes Relative: 7.9 % (ref 3.0–12.0)
Neutro Abs: 3.8 10*3/uL (ref 1.4–7.7)
Neutrophils Relative %: 66.2 % (ref 43.0–77.0)
Platelets: 245 10*3/uL (ref 150.0–400.0)
RBC: 5.05 Mil/uL (ref 4.22–5.81)
RDW: 14.3 % (ref 11.5–15.5)
WBC: 5.7 10*3/uL (ref 4.0–10.5)

## 2019-04-08 LAB — POCT GLYCOSYLATED HEMOGLOBIN (HGB A1C): Hemoglobin A1C: 6.6 % — AB (ref 4.0–5.6)

## 2019-04-08 LAB — COMPREHENSIVE METABOLIC PANEL
ALT: 26 U/L (ref 0–53)
AST: 18 U/L (ref 0–37)
Albumin: 4.7 g/dL (ref 3.5–5.2)
Alkaline Phosphatase: 65 U/L (ref 39–117)
BUN: 14 mg/dL (ref 6–23)
CO2: 30 mEq/L (ref 19–32)
Calcium: 9.8 mg/dL (ref 8.4–10.5)
Chloride: 99 mEq/L (ref 96–112)
Creatinine, Ser: 0.95 mg/dL (ref 0.40–1.50)
GFR: 79.32 mL/min (ref >60.00)
Glucose, Bld: 135 mg/dL — ABNORMAL HIGH (ref 70–99)
Potassium: 4.8 mEq/L (ref 3.5–5.1)
Sodium: 138 mEq/L (ref 135–145)
Total Bilirubin: 0.6 mg/dL (ref 0.2–1.2)
Total Protein: 7 g/dL (ref 6.0–8.3)

## 2019-04-08 LAB — MICROALBUMIN / CREATININE URINE RATIO
Creatinine,U: 31.8 mg/dL
Microalb Creat Ratio: 35 mg/g — ABNORMAL HIGH (ref 0.0–30.0)
Microalb, Ur: 11.1 mg/dL — ABNORMAL HIGH (ref 0.0–1.9)

## 2019-04-08 NOTE — Patient Instructions (Signed)
Please return in 3 months for diabetes follow up  Please find out which SGLT2-inhibitor is the most cost effective on your current drug plan and let me know; then I will change it.  If you have any questions or concerns, please don't hesitate to send me a message via MyChart or call the office at [phone]. Thank you for visiting with [place] today! It's our pleasure caring for you.   Preventive Care 45 Years and Older, Male Preventive care refers to lifestyle choices and visits with your health care provider that can promote health and wellness. This includes:  A yearly physical exam. This is also called an annual well check.  Regular dental and eye exams.  Immunizations.  Screening for certain conditions.  Healthy lifestyle choices, such as diet and exercise. What can I expect for my preventive care visit? Physical exam Your health care provider will check:  Height and weight. These may be used to calculate body mass index (BMI), which is a measurement that tells if you are at a healthy weight.  Heart rate and blood pressure.  Your skin for abnormal spots. Counseling Your health care provider may ask you questions about:  Alcohol, tobacco, and drug use.  Emotional well-being.  Home and relationship well-being.  Sexual activity.  Eating habits.  History of falls.  Memory and ability to understand (cognition).  Work and work Statistician. What immunizations do I need?  Influenza (flu) vaccine  This is recommended every year. Tetanus, diphtheria, and pertussis (Tdap) vaccine  You may need a Td booster every 10 years. Varicella (chickenpox) vaccine  You may need this vaccine if you have not already been vaccinated. Zoster (shingles) vaccine  You may need this after age 15. Pneumococcal conjugate (PCV13) vaccine  One dose is recommended after age 70. Pneumococcal polysaccharide (PPSV23) vaccine  One dose is recommended after age 38. Measles, mumps, and  rubella (MMR) vaccine  You may need at least one dose of MMR if you were born in 1957 or later. You may also need a second dose. Meningococcal conjugate (MenACWY) vaccine  You may need this if you have certain conditions. Hepatitis A vaccine  You may need this if you have certain conditions or if you travel or work in places where you may be exposed to hepatitis A. Hepatitis B vaccine  You may need this if you have certain conditions or if you travel or work in places where you may be exposed to hepatitis B. Haemophilus influenzae type b (Hib) vaccine  You may need this if you have certain conditions. You may receive vaccines as individual doses or as more than one vaccine together in one shot (combination vaccines). Talk with your health care provider about the risks and benefits of combination vaccines. What tests do I need? Blood tests  Lipid and cholesterol levels. These may be checked every 5 years, or more frequently depending on your overall health.  Hepatitis C test.  Hepatitis B test. Screening  Lung cancer screening. You may have this screening every year starting at age 75 if you have a 30-pack-year history of smoking and currently smoke or have quit within the past 15 years.  Colorectal cancer screening. All adults should have this screening starting at age 64 and continuing until age 8. Your health care provider may recommend screening at age 32 if you are at increased risk. You will have tests every 1-10 years, depending on your results and the type of screening test.  Prostate cancer screening. Recommendations  will vary depending on your family history and other risks.  Diabetes screening. This is done by checking your blood sugar (glucose) after you have not eaten for a while (fasting). You may have this done every 1-3 years.  Abdominal aortic aneurysm (AAA) screening. You may need this if you are a current or former smoker.  Sexually transmitted disease (STD)  testing. Follow these instructions at home: Eating and drinking  Eat a diet that includes fresh fruits and vegetables, whole grains, lean protein, and low-fat dairy products. Limit your intake of foods with high amounts of sugar, saturated fats, and salt.  Take vitamin and mineral supplements as recommended by your health care provider.  Do not drink alcohol if your health care provider tells you not to drink.  If you drink alcohol: ? Limit how much you have to 0-2 drinks a day. ? Be aware of how much alcohol is in your drink. In the [place] one drink equals one 12 oz bottle of beer (355 mL), one 5 oz glass of wine (148 mL), or one 1 oz glass of hard liquor (44 mL). Lifestyle  Take daily care of your teeth and gums.  Stay active. Exercise for at least 30 minutes on 5 or more days each week.  Do not use any products that contain nicotine or tobacco, such as cigarettes, e-cigarettes, and chewing tobacco. If you need help quitting, ask your health care provider.  If you are sexually active, practice safe sex. Use a condom or other form of protection to prevent STIs (sexually transmitted infections).  Talk with your health care provider about taking a low-dose aspirin or statin. What's next?  Visit your health care provider once a year for a well check visit.  Ask your health care provider how often you should have your eyes and teeth checked.  Stay up to date on all vaccines. This information is not intended to replace advice given to you by your health care provider. Make sure you discuss any questions you have with your health care provider. Document Released: 06/16/2015 Document Revised: 05/14/2018 Document Reviewed: 05/14/2018 Elsevier Patient Education  2020 [employer].

## 2019-04-08 NOTE — Progress Notes (Signed)
Subjective:   Jason Snyder is a 68 y.o. male who presents for an Initial Medicare Annual Wellness Visit.  Review of Systems   Cardiac Risk Factors include: advanced age (>30men, >29 women);diabetes mellitus;male gender;hypertension   Objective:    Today's Vitals   04/08/19 0832  BP: 136/78  Temp: 98.5 F (36.9 C)  Weight: 244 lb 14.9 oz (111.1 kg)  Height: 6\' 2"  (1.88 m)   Body mass index is 31.45 kg/m.  Advanced Directives 04/08/2019 01/01/2019  Does Patient Have a Medical Advance Directive? No No  Would patient like information on creating a medical advance directive? Yes (MAU/Ambulatory/Procedural Areas - Information given) No - Patient declined    Current Medications (verified) Outpatient Encounter Medications as of 04/08/2019  Medication Sig  . acetaminophen-codeine (TYLENOL #3) 300-30 MG tablet Take 1 tablet by mouth every 6 (six) hours as needed for moderate pain.  Marland Kitchen albuterol (VENTOLIN HFA) 108 (90 Base) MCG/ACT inhaler INHALE 2 PUFFS BY MOUTH EVERY 4 HOURS AS NEEDED FOR WHEEZING (Patient taking differently: Inhale 2 puffs into the lungs every 4 (four) hours as needed for wheezing or shortness of breath. )  . amLODipine (NORVASC) 10 MG tablet TAKE ONE TABLET BY MOUTH ONE TIME DAILY  (Patient taking differently: Take 10 mg by mouth daily. )  . aspirin 81 MG EC tablet Take 81 mg by mouth daily.   Marland Kitchen atorvastatin (LIPITOR) 10 MG tablet Take 1 tablet (10 mg total) by mouth at bedtime.  . cyclobenzaprine (FLEXERIL) 10 MG tablet Take 1 tablet (10 mg total) by mouth 3 (three) times daily as needed for muscle spasms.  . dapagliflozin propanediol (FARXIGA) 10 MG TABS tablet Take 10 mg by mouth daily.  . diclofenac sodium (VOLTAREN) 1 % GEL Apply 2 g topically 4 (four) times daily as needed (pain).   . finasteride (PROSCAR) 5 MG tablet Take 5 mg by mouth daily.   . fluticasone (FLONASE) 50 MCG/ACT nasal spray Place 1 spray into the nose daily as needed for allergies.   Marland Kitchen  gabapentin (NEURONTIN) 100 MG capsule Take 1-3 capsules (100-300 mg total) by mouth at bedtime.  . Glycopyrrolate-Formoterol (BEVESPI AEROSPHERE) 9-4.8 MCG/ACT AERO Inhale 2 puffs into the lungs 2 (two) times a day.  . hydrochlorothiazide (HYDRODIURIL) 25 MG tablet Take 12.5 mg by mouth daily.  Marland Kitchen ibuprofen (ADVIL) 200 MG tablet Take 200 mg by mouth 2 (two) times daily as needed for moderate pain.  Marland Kitchen Ketotifen Fumarate (ALLERGY EYE DROPS OP) Place 1 drop into both eyes daily.  . Loratadine (CLARITIN) 10 MG CAPS Take 10 mg by mouth daily as needed (allergies).   . losartan (COZAAR) 100 MG tablet TAKE ONE TABLET BY MOUTH ONE TIME DAILY   . metFORMIN (GLUCOPHAGE) 500 MG tablet Take 1 tablet (500 mg total) by mouth 2 (two) times daily with a meal.  . metoprolol tartrate (LOPRESSOR) 50 MG tablet TAKE ONE TABLET BY MOUTH TWICE DAILY  (Patient taking differently: Take 50 mg by mouth 2 (two) times daily. )  . Multiple Vitamin (MULTIVITAMIN) capsule Take 1 capsule by mouth daily.   . naproxen sodium (ALEVE) 220 MG tablet Take 220 mg by mouth daily as needed (pain).  Marland Kitchen neomycin-polymyxin-hydrocortisone (CORTISPORIN) OTIC solution Place 3 drops into the left ear 4 (four) times daily. (Patient taking differently: Place 3 drops into both ears 4 (four) times daily as needed (ear infections). )  . nitroGLYCERIN (NITROSTAT) 0.4 MG SL tablet Place 1 tablet (0.4 mg total) under the tongue  every 5 (five) minutes as needed for chest pain.  . tamsulosin (FLOMAX) 0.4 MG CAPS capsule Take 2 capsules (0.8 mg total) by mouth daily. (Patient taking differently: Take 0.4 mg by mouth 2 (two) times a day. )  . testosterone cypionate (DEPOTESTOSTERONE CYPIONATE) 200 MG/ML injection INJECT 1ML INTO THE MUSCLE EVERY 14 DAYS   No facility-administered encounter medications on file as of 04/08/2019.     Allergies (verified) Rubus fruticosus   History: Past Medical History:  Diagnosis Date  . BMI 30.0-30.9,adult   . BPH  without obstruction/lower urinary tract symptoms 10/03/2017  . CAD (coronary artery disease)    cath 2020, single vessel disease, medical mgt  . Chronic left shoulder pain c  . Chronic low back pain with right-sided sciatica   . Chronic pain 10/07/2013  . Controlled type 2 diabetes mellitus without complication, without long-term current use of insulin ([place]) 07/12/2014  . COPD (chronic obstructive pulmonary disease) ([place]) 01/07/2014   PFTs 07/2018 minimal airway obstruction, overinflation and no response to bronchodilators.   . DDD (degenerative disc disease), lumbar   . Diabetes mellitus without complication ([place])   . 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 Encounter for screening for lung cancer 11/09/2018   Chest CT: neg lung cancer: + COPD changes and cardiovascular calcifications  . Essential hypertension 08/31/2010   Elevated ASCVD risk - started lipitor 04/2013  . Gastroesophageal reflux disease 01/10/2015  . History of lumbar surgery   . Hyperlipidemia   . Hypertension   . 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 Obesity (BMI 30-39.9)   . Obesity (BMI 30.0-34.9) 10/03/2017  . OSA on CPAP 10/07/2013  . Primary osteoarthritis involving multiple joints 01/10/2015  . Rosacea   . Smoking greater than 30 pack years - quit 03/2018 10/03/2017  . Thoracic and lumbosacral neuritis    Past Surgical History:  Procedure Laterality Date  . ARTHROSCOPIC REPAIR ACL    . CARDIAC CATHETERIZATION    . INTRAVASCULAR PRESSURE WIRE/FFR STUDY N/A 01/01/2019   Procedure: INTRAVASCULAR PRESSURE WIRE/FFR STUDY;  Surgeon: Leonie Man, MD;  Location: [facility];  Service: Cardiovascular;  Laterality: N/A;  . LEFT HEART CATH AND CORONARY ANGIOGRAPHY N/A 01/01/2019   Procedure: LEFT HEART CATH AND CORONARY ANGIOGRAPHY;  Surgeon: Leonie Man, MD;  Location: [facility];  Service: Cardiovascular;  Laterality: N/A;  . PROSTATE CRYOABLATION      03/2019  . SHOULDER ARTHROSCOPY    . SPINE SURGERY     Family History  Problem Relation Age of Onset  . Pulmonary embolism Mother   . Heart disease Father   . Lung cancer Father   . Lung cancer Sister   . Heart disease Maternal Uncle    Social History   Socioeconomic History  . Marital status: Married    Spouse name: Not on file  . Number of children: Not on file  . Years of education: Not on file  . Highest education level: Not on file  Occupational History  . Not on file  Social Needs  . Financial resource strain: Not on file  . Food insecurity    Worry: Not on file    Inability: Not on file  . Transportation needs    Medical: Not on file    Non-medical: Not on file  Tobacco Use  . Smoking status: Former Smoker    Packs/day: 0.50    Years: 30.00    Pack  years: 15.00    Types: Cigarettes    Quit date: 03/05/2018    Years since quitting: 1.0  . Smokeless tobacco: Never Used  . Tobacco comment: no cigarettes since 03/05/18  Substance and Sexual Activity  . Alcohol use: Not Currently  . Drug use: Not Currently  . Sexual activity: Not Currently  Lifestyle  . Physical activity    Days per week: Not on file    Minutes per session: Not on file  . Stress: Not on file  Relationships  . Social Herbalist on phone: Not on file    Gets together: Not on file    Attends religious service: Not on file    Active member of club or organization: Not on file    Attends meetings of clubs or organizations: Not on file    Relationship status: Not on file  Other Topics Concern  . Not on file  Social History Narrative  . Not on file   Tobacco Counseling Counseling given: Not Answered Comment: no cigarettes since 03/05/18   Clinical Intake:  Pre-visit preparation completed: Yes  Pain : No/denies pain  Diabetes: Yes CBG done?: Yes(poct a1c done) CBG resulted in Enter/ Edit results?: Yes Did pt. bring in CBG monitor from home?: No  How often do you need to  have someone help you when you read instructions, pamphlets, or other written materials from your doctor or pharmacy?: 1 - Never  Interpreter Needed?: No  Information entered by :: Denman George LPN  Activities of Daily Living In your present state of health, do you have any difficulty performing the following activities: 04/08/2019 04/08/2019  Hearing? N N  Vision? N N  Difficulty concentrating or making decisions? N N  Walking or climbing stairs? N N  Dressing or bathing? N N  Doing errands, shopping? N N  Preparing Food and eating ? N -  Using the Toilet? N -  In the past six months, have you accidently leaked urine? N -  Do you have problems with loss of bowel control? N -  Managing your Medications? N -  Managing your Finances? N -  Housekeeping or managing your Housekeeping? N -  Some recent data might be hidden     Immunizations and Health Maintenance Immunization History  Administered Date(s) Administered  . Fluad Quad(high Dose 65+) 02/02/2019  . Hepatitis B, ped/adol 06/04/2003  . Influenza Split 03/03/2010  . Influenza, High Dose Seasonal PF 03/21/2017, 03/03/2018  . Influenza, Seasonal, Injecte, Preservative Fre 03/23/2014, 04/12/2015  . Influenza,inj,Quad PF,6+ Mos 04/01/2016  . Pneumococcal Conjugate-13 06/06/2018  . Pneumococcal Polysaccharide-23 06/04/2003  . Td 06/04/2003  . Tdap 01/10/2015  . Zoster 09/18/2015   There are no preventive care reminders to display for this patient.  Patient Care Team: [name], MD as PCP - General (Family Medicine) Marshell Garfinkel, MD as Consulting Physician (Pulmonary Disease) [facility], [facility] Leonie Man, MD as Consulting Physician (Cardiology)  Indicate any recent Medical Services you may have received from other than [facility] providers in the past year (date may be approximate).    Assessment:   This is a routine wellness examination for Ke.  Hearing/Vision screen No exam data present   Dietary issues and exercise activities discussed: Current Exercise Habits: Home exercise routine, Type of exercise: walking;Other - see comments(yardwork), Time (Minutes): 30, Frequency (Times/Week): 5, Weekly Exercise (Minutes/Week): 150, Intensity: Mild  Goals   None    Depression Screen PHQ 2/9 Scores 04/08/2019  10/08/2018 10/03/2017  PHQ - 2 Score 0 0 0  PHQ- 9 Score - - 0    Fall Risk Fall Risk  04/08/2019 10/08/2018  Falls in the past year? 0 0  Number falls in past yr: 0 0  Injury with Fall? 0 0  Follow up - Falls evaluation completed    Is the patient's home free of loose throw rugs in walkways, pet beds, electrical cords, etc?   yes      Grab bars in the bathroom? yes      Handrails on the stairs?   yes      Adequate lighting?   yes  Timed Get Up and Go performed: completed and within normal timeframe; no gait abnormalities noted   Cognitive Function: no cognitive concerns at this time   Alert? Yes         Normal Appearance? Yes Oriented to person? Yes           Place? Yes  Time? Yes  Recall of three objects? Yes  Can perform simple calculations? Yes  Displays appropriate judgment? Yes  Can read the correct time from a watch face? Yes    Screening Tests Health Maintenance  Topic Date Due  . OPHTHALMOLOGY EXAM  10/05/2019 (Originally 03/05/2019)  . PNA vac Low Risk Adult (2 of 2 - PPSV23) 06/07/2019  . HEMOGLOBIN A1C  07/15/2019  . FOOT EXAM  01/12/2020  . Fecal DNA (Cologuard)  06/25/2021  . TETANUS/TDAP  01/09/2025  . INFLUENZA VACCINE  Completed  . Hepatitis C Screening  Completed  . HIV Screening  Discontinued    Qualifies for Shingles Vaccine? Discussed and patient will check with pharmacy for coverage.  Patient education handout provided   Cancer Screenings: Lung: Low Dose CT Chest recommended if Age 65-80 years, 30 pack-year currently smoking OR have quit w/in 15years. Patient does qualify. Followed by Lung Screening Team yearly  Colorectal: Cologuard  normal 06/30/18    Plan:  I have personally reviewed and addressed the Medicare Annual Wellness questionnaire and have noted the following in the patient's chart:  A. Medical and social history B. Use of alcohol, tobacco or illicit drugs  C. Current medications and supplements D. Functional ability and status E.  Nutritional status F.  Physical activity G. Advance directives H. List of other physicians I.  Hospitalizations, surgeries, and ER visits in previous 12 months J.  [place] such as hearing and vision if needed, cognitive and depression L. Referrals, records requested, and appointments- none   In addition, I have reviewed and discussed with patient certain preventive protocols, quality metrics, and best practice recommendations. A written personalized care plan for preventive services as well as general preventive health recommendations were provided to patient.   Signed,  Denman George, LPN  Nurse Health Advisor   Nurse Notes: no additional

## 2019-04-08 NOTE — Patient Instructions (Addendum)
Jason Snyder , Thank you for taking time to come for your Medicare Wellness Visit. I appreciate your ongoing commitment to your health goals. Please review the following plan we discussed and let me know if I can assist you in the future.   Screening recommendations/referrals: Colorectal Screening: up to date; Cologuard 06/30/18; repeat due 06/2021  Vision and Dental Exams: Recommended annual ophthalmology exams for early detection of glaucoma and other disorders of the eye Recommended annual dental exams for proper oral hygiene  Diabetic Exams: Diabetic Eye Exam: recommended yearly Diabetic Foot Exam: recommended yearly; up to date- last 01/12/19  Vaccinations: Influenza vaccine: completed 02/02/19 Pneumococcal vaccine: up to date; last 06/06/18 Tdap vaccine: up to date; last 01/10/15 Shingles vaccine: Please call your insurance company to determine your out of pocket expense for the Shingrix vaccine. You may receive this vaccine at your local pharmacy.  Advanced directives: Advance directives discussed with you today. I have provided a copy for you to complete at home and have notarized. Once this is complete please bring a copy in to our office so we can scan it into your chart.  Goals: Recommend to drink at least 6-8 8oz glasses of water per day and consume a balanced diet rich in fresh fruits and vegetables.   Next appointment: Please schedule your Annual Wellness Visit with your Nurse Health Advisor in one year.  Preventive Care 13 Years and Older, Male Preventive care refers to lifestyle choices and visits with your health care provider that can promote health and wellness. What does preventive care include?  A yearly physical exam. This is also called an annual well check.  Dental exams once or twice a year.  Routine eye exams. Ask your health care provider how often you should have your eyes checked.  Personal lifestyle choices, including:  Daily care of your teeth and  gums.  Regular physical activity.  Eating a healthy diet.  Avoiding tobacco and drug use.  Limiting alcohol use.  Practicing safe sex.  Taking low doses of aspirin every day if recommended by your health care provider..  Taking vitamin and mineral supplements as recommended by your health care provider. What happens during an annual well check? The services and screenings done by your health care provider during your annual well check will depend on your age, overall health, lifestyle risk factors, and family history of disease. Counseling  Your health care provider may ask you questions about your:  Alcohol use.  Tobacco use.  Drug use.  Emotional well-being.  Home and relationship well-being.  Sexual activity.  Eating habits.  History of falls.  Memory and ability to understand (cognition).  Work and work Statistician. Screening  You may have the following tests or measurements:  Height, weight, and BMI.  Blood pressure.  Lipid and cholesterol levels. These may be checked every 5 years, or more frequently if you are over 101 years old.  Skin check.  Lung cancer screening. You may have this screening every year starting at age 51 if you have a 30-pack-year history of smoking and currently smoke or have quit within the past 15 years.  Fecal occult blood test (FOBT) of the stool. You may have this test every year starting at age 21.  Flexible sigmoidoscopy or colonoscopy. You may have a sigmoidoscopy every 5 years or a colonoscopy every 10 years starting at age 51.  Prostate cancer screening. Recommendations will vary depending on your family history and other risks.  Hepatitis C blood test.  Hepatitis B blood test.  Sexually transmitted disease (STD) testing.  Diabetes screening. This is done by checking your blood sugar (glucose) after you have not eaten for a while (fasting). You may have this done every 1-3 years.  Abdominal aortic aneurysm (AAA)  screening. You may need this if you are a current or former smoker.  Osteoporosis. You may be screened starting at age 72 if you are at high risk. Talk with your health care provider about your test results, treatment options, and if necessary, the need for more tests. Vaccines  Your health care provider may recommend certain vaccines, such as:  Influenza vaccine. This is recommended every year.  Tetanus, diphtheria, and acellular pertussis (Tdap, Td) vaccine. You may need a Td booster every 10 years.  Zoster vaccine. You may need this after age 22.  Pneumococcal 13-valent conjugate (PCV13) vaccine. One dose is recommended after age 45.  Pneumococcal polysaccharide (PPSV23) vaccine. One dose is recommended after age 4. Talk to your health care provider about which screenings and vaccines you need and how often you need them. This information is not intended to replace advice given to you by your health care provider. Make sure you discuss any questions you have with your health care provider. Document Released: 06/16/2015 Document Revised: 02/07/2016 Document Reviewed: 03/21/2015 Elsevier Interactive Patient Education  2017 [place] Prevention in the Home Falls can cause injuries. They can happen to people of all ages. There are many things you can do to make your home safe and to help prevent falls. What can I do on the outside of my home?  Regularly fix the edges of walkways and driveways and fix any cracks.  Remove anything that might make you trip as you walk through a door, such as a raised step or threshold.  Trim any bushes or trees on the path to your home.  Use bright outdoor lighting.  Clear any walking paths of anything that might make someone trip, such as rocks or tools.  Regularly check to see if handrails are loose or broken. Make sure that both sides of any steps have handrails.  Any raised decks and porches should have guardrails on the  edges.  Have any leaves, snow, or ice cleared regularly.  Use sand or salt on walking paths during winter.  Clean up any spills in your garage right away. This includes oil or grease spills. What can I do in the bathroom?  Use night lights.  Install grab bars by the toilet and in the tub and shower. Do not use towel bars as grab bars.  Use non-skid mats or decals in the tub or shower.  If you need to sit down in the shower, use a plastic, non-slip stool.  Keep the floor dry. Clean up any water that spills on the floor as soon as it happens.  Remove soap buildup in the tub or shower regularly.  Attach bath mats securely with double-sided non-slip rug tape.  Do not have throw rugs and other things on the floor that can make you trip. What can I do in the bedroom?  Use night lights.  Make sure that you have a light by your bed that is easy to reach.  Do not use any sheets or blankets that are too big for your bed. They should not hang down onto the floor.  Have a firm chair that has side arms. You can use this for support while you get dressed.  Do not  have throw rugs and other things on the floor that can make you trip. What can I do in the kitchen?  Clean up any spills right away.  Avoid walking on wet floors.  Keep items that you use a lot in easy-to-reach places.  If you need to reach something above you, use a strong step stool that has a grab bar.  Keep electrical cords out of the way.  Do not use floor polish or wax that makes floors slippery. If you must use wax, use non-skid floor wax.  Do not have throw rugs and other things on the floor that can make you trip. What can I do with my stairs?  Do not leave any items on the stairs.  Make sure that there are handrails on both sides of the stairs and use them. Fix handrails that are broken or loose. Make sure that handrails are as long as the stairways.  Check any carpeting to make sure that it is firmly  attached to the stairs. Fix any carpet that is loose or worn.  Avoid having throw rugs at the top or bottom of the stairs. If you do have throw rugs, attach them to the floor with carpet tape.  Make sure that you have a light switch at the top of the stairs and the bottom of the stairs. If you do not have them, ask someone to add them for you. What else can I do to help prevent falls?  Wear shoes that:  Do not have high heels.  Have rubber bottoms.  Are comfortable and fit you well.  Are closed at the toe. Do not wear sandals.  If you use a stepladder:  Make sure that it is fully opened. Do not climb a closed stepladder.  Make sure that both sides of the stepladder are locked into place.  Ask someone to hold it for you, if possible.  Clearly mark and make sure that you can see:  Any grab bars or handrails.  First and last steps.  Where the edge of each step is.  Use tools that help you move around (mobility aids) if they are needed. These include:  Canes.  Walkers.  Scooters.  Crutches.  Turn on the lights when you go into a dark area. Replace any light bulbs as soon as they burn out.  Set up your furniture so you have a clear path. Avoid moving your furniture around.  If any of your floors are uneven, fix them.  If there are any pets around you, be aware of where they are.  Review your medicines with your doctor. Some medicines can make you feel dizzy. This can increase your chance of falling. Ask your doctor what other things that you can do to help prevent falls. This information is not intended to replace advice given to you by your health care provider. Make sure you discuss any questions you have with your health care provider. Document Released: 03/16/2009 Document Revised: 10/26/2015 Document Reviewed: 06/24/2014 Elsevier Interactive Patient Education  2017 [employer].

## 2019-04-08 NOTE — Progress Notes (Signed)
Subjective  Chief Complaint  Patient presents with  . Annual Exam  . Diabetes  . Hypertension  . Hyperlipidemia  . Coronary Artery Disease    HPI: Jason Snyder is a 68 y.o. male who presents to [facility] today for a Male Wellness Visit. He also has the concerns and/or needs as listed above in the chief complaint. These will be addressed in addition to the Health Maintenance Visit.   Wellness Visit: annual visit with health maintenance review and exam    68 year old man with multiple medical problems now on Medicare.  Here for Medicare physical with labs.  Health maintenance screens are up-to-date.  Immunizations are up-to-date.  He is feeling well overall Lifestyle: Body mass index is 31.46 kg/m. Wt Readings from Last 3 Encounters:  04/08/19 244 lb 14.9 oz (111.1 kg)  04/08/19 245 lb (111.1 kg)  02/01/19 253 lb 1.9 oz (114.8 kg)   Diet: Diabetic, low-sodium Exercise: occasionally,   Chronic disease management visit and/or acute problem visit:  Diabetes: Had been having worsening control, started [place] at last visit and doing very well now.  Weight is down about 8 pounds.  Tolerating it without side effects.  Cost may be an issue.  He is deferring eye exam until next year.  No history of retinopathy.  Mild neuropathy symptoms now on gabapentin.  Improved control.  Due for lab work  Hypertension and hyperlipidemia: Due for recheck.  On medications.  Both been well controlled.  No myalgias  Single-vessel coronary artery disease: Has used nitroglycerin 4 times.  No persistent or unstable angina symptoms.  Follows with Dr. Ellyn Hack.  COPD per pulmonology.  Stable.  Status post prostate procedure due to BPH with obstructive symptoms.  On testosterone replacement for hypogonadism  Chronic narcotic use for chronic pain, stable.  Reviewed drug database.  It is appropriate.  Due for drug contract up-to-date.  Has been stable for many years, no escalation  in use.  Patient Active Problem List   Diagnosis Date Noted  . Coronary artery disease involving native coronary artery of native heart without angina pectoris     Priority: High  . Smoking greater than 30 pack years - quit 03/2018 10/03/2017    Priority: High  . Type 2 diabetes mellitus with peripheral neuropathy ([place]) 07/12/2014    Priority: High  . COPD (chronic obstructive pulmonary disease) ([place]) 01/07/2014    Priority: High  . Chronic pain 10/07/2013    Priority: High  . OSA on CPAP 10/07/2013    Priority: High  . Essential hypertension 08/31/2010    Priority: High  . Obesity (BMI 30.0-34.9) 10/03/2017    Priority: Medium  . BPH without obstruction/lower urinary tract symptoms 10/03/2017    Priority: Medium  . Gastroesophageal reflux disease 01/10/2015    Priority: Medium  . Primary osteoarthritis involving multiple joints 01/10/2015    Priority: Medium  . 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    Priority: Medium  . Rosacea 10/03/2017    Priority: Low  . 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    Priority: Low  . Encounter for screening for lung cancer 11/09/2018   Health Maintenance  Topic Date Due  . OPHTHALMOLOGY EXAM  10/05/2019 (Originally 03/05/2019)  . PNA vac Low Risk Adult (2 of 2 - PPSV23) 06/07/2019  . HEMOGLOBIN A1C  07/15/2019  . FOOT EXAM  01/12/2020  . Fecal DNA (Cologuard)  06/25/2021  . TETANUS/TDAP  01/09/2025  . INFLUENZA VACCINE  Completed  . Hepatitis C  Screening  Completed  . HIV Screening  Discontinued   Immunization History  Administered Date(s) Administered  . Fluad Quad(high Dose 65+) 02/02/2019  . Hepatitis B, ped/adol 06/04/2003  . Influenza Split 03/03/2010  . Influenza, High Dose Seasonal PF 03/21/2017, 03/03/2018  . Influenza, Seasonal, Injecte, Preservative Fre 03/23/2014, 04/12/2015  . Influenza,inj,Quad PF,6+ Mos 04/01/2016  . Pneumococcal Conjugate-13 06/06/2018  . Pneumococcal Polysaccharide-23 06/04/2003  . Td 06/04/2003  . Tdap  01/10/2015  . Zoster 09/18/2015   We updated and reviewed the patient's past history in detail and it is documented below. Allergies: Patient is allergic to rubus fruticosus. Past Medical History  has a past medical history of BMI 30.0-30.9,adult, BPH without obstruction/lower urinary tract symptoms (10/03/2017), CAD (coronary artery disease), Chronic left shoulder pain (c), Chronic low back pain with right-sided sciatica, Chronic pain (10/07/2013), Controlled type 2 diabetes mellitus without complication, without long-term current use of insulin ([place]) (07/12/2014), COPD (chronic obstructive pulmonary disease) ([place]) (01/07/2014), DDD (degenerative disc disease), lumbar, Diabetes mellitus without complication ([place]), Eczema of external ear (06/30/2012), Encounter for screening for lung cancer (11/09/2018), Essential hypertension (08/31/2010), Gastroesophageal reflux disease (01/10/2015), History of lumbar surgery, Hyperlipidemia, Hypertension, Hypogonadism male (04/23/2010), Obesity (BMI 30-39.9), Obesity (BMI 30.0-34.9) (10/03/2017), OSA on CPAP (10/07/2013), Primary osteoarthritis involving multiple joints (01/10/2015), Rosacea, Smoking greater than 30 pack years - quit 03/2018 (10/03/2017), and Thoracic and lumbosacral neuritis. Past Surgical History Patient  has a past surgical history that includes Shoulder arthroscopy; Spine surgery; Arthroscopic repair ACL; LEFT HEART CATH AND CORONARY ANGIOGRAPHY (N/A, 01/01/2019); INTRAVASCULAR PRESSURE WIRE/FFR STUDY (N/A, 01/01/2019); Cardiac catheterization; and Prostate Cryoablation. Social History Patient  reports that he quit smoking about 13 months ago. His smoking use included cigarettes. He has a 15.00 pack-year smoking history. He has never used smokeless tobacco. He reports previous alcohol use. He reports previous drug use. Family History family history includes Heart disease in his father and maternal uncle; Lung cancer in his father and sister; Pulmonary embolism in his  mother. Review of Systems: Constitutional: negative for fever or malaise Ophthalmic: negative for photophobia, double vision or loss of vision Cardiovascular: positive for chest pain, no dyspnea on exertion, or new LE swelling Respiratory: negative for SOB or persistent cough Gastrointestinal: negative for abdominal pain, change in bowel habits or melena Genitourinary: negative for dysuria or gross hematuria Musculoskeletal: negative for new gait disturbance or muscular weakness Integumentary: negative for new or persistent rashes Neurological: negative for TIA or stroke symptoms Psychiatric: negative for SI or delusions Allergic/Immunologic: negative for hives  Patient Care Team    Relationship Specialty Notifications Start End  [name], MD PCP - General Family Medicine  10/03/17   Marshell Garfinkel, MD Consulting Physician Pulmonary Disease  10/08/18   [facility], [facility]    10/08/18   Leonie Man, MD Consulting Physician Cardiology  01/12/19    Objective  Vitals: BP 136/78   Pulse 70   Temp 98.5 F (36.9 C) (Temporal)   Ht _0  (1.88 m)   Wt 245 lb (111.1 kg)   SpO2 95%   BMI 31.46 kg/m  General:  Well developed, well nourished, no acute distress  Psych:  Alert and orientedx3,normal mood and affect HEENT:  Normocephalic, atraumatic, non-icteric sclera, PERRL, oropharynx is clear without mass or exudate, supple neck without adenopathy, mass or thyromegaly Cardiovascular:  Normal S1, S2, RRR without gallop, rub or murmur, nondisplaced PMI, +2 distal pulses in bilateral upper and lower extremities. Respiratory:  Good breath sounds bilaterally, CTAB  with normal respiratory effort Gastrointestinal: normal bowel sounds, soft, non-tender, no noted masses. No HSM MSK: no deformities, contusions. Joints are without erythema or swelling. Spine and CVA region are nontender Skin:  Warm, no rashes or suspicious lesions noted Neurologic:    Mental status is normal. CN  2-11 are normal. Gross motor and sensory exams are normal. Stable gait. No tremor GU: No inguinal hernias or adenopathy are appreciated bilaterally, cath in place   Lab Results  Component Value Date   HGBA1C 6.6 (A) 04/08/2019    Assessment  1. Type 2 diabetes mellitus with peripheral neuropathy (HCC)   2. Chronic obstructive pulmonary disease, unspecified COPD type ([place])   3. Essential hypertension   4. Chronic pain syndrome   5. Hypogonadism male   6. Coronary artery disease involving native coronary artery of native heart without angina pectoris   7. BPH with urinary obstruction      Plan  Male Wellness Visit:  Age appropriate Health Maintenance and Prevention measures were discussed with patient. Included topics are cancer screening recommendations, ways to keep healthy (see AVS) including dietary and exercise recommendations, regular eye and dental care, use of seat belts, and avoidance of moderate alcohol use and tobacco use.   BMI: discussed patient's BMI and encouraged positive lifestyle modifications to help get to or maintain a target BMI.  HM needs and immunizations were addressed and ordered. See below for orders. See HM and immunization section for updates.  Routine labs and screening tests ordered including cmp, cbc and lipids where appropriate.  Discussed recommendations regarding Vit D and calcium supplementation (see AVS)  Chronic disease f/u and/or acute problem visit: (deemed necessary to be done in addition to the wellness visit):  Chronic medical problems are now well controlled.  Diabetes is now well controlled. Continue diabetic medications, will adjust SGLT2 inhibitor to be the most cost effective 1.  Check renal, electrolytes, lipids and CBC.  Deferring eye exam till next year no changes in other medications at this time.  Follow up: Return in about 3 months (around 07/09/2019) for follow up of diabetes and hypertension.   Commons side effects, risks,  benefits, and alternatives for medications and treatment plan prescribed today were discussed, and the patient expressed understanding of the given instructions. Patient is instructed to call or message via MyChart if he/she has any questions or concerns regarding our treatment plan. No barriers to understanding were identified. We discussed Red Flag symptoms and signs in detail. Patient expressed understanding regarding what to do in case of urgent or emergency type symptoms.   Medication list was reconciled, printed and provided to the patient in AVS. Patient instructions and summary information was reviewed with the patient as documented in the AVS. This note was prepared with assistance of Dragon voice recognition software. Occasional wrong-word or sound-a-like substitutions may have occurred due to the inherent limitations of voice recognition software  Orders Placed This Encounter  Procedures  . CBC w/Diff  . CMP  . Lipids  . Urine MAC  . A1C POCT   No orders of the defined types were placed in this encounter.

## 2019-04-09 NOTE — Progress Notes (Signed)
I have reviewed the documentation from the recent AWV done by Courtney Slade, RN; I agree with the documentation and will follow up on any recommendations or abnormal findings as suggested.  

## 2019-04-10 ENCOUNTER — Encounter: Payer: Self-pay | Admitting: Family Medicine

## 2019-04-12 ENCOUNTER — Other Ambulatory Visit: Payer: Self-pay | Admitting: Family Medicine

## 2019-04-12 MED ORDER — ATORVASTATIN CALCIUM 20 MG PO TABS: 20.0000 mg | ORAL_TABLET | Freq: Every day | ORAL | 3 refills | Status: DC

## 2019-04-13 ENCOUNTER — Other Ambulatory Visit: Payer: Self-pay | Admitting: Family Medicine

## 2019-05-03 ENCOUNTER — Other Ambulatory Visit: Payer: Self-pay | Admitting: Family Medicine

## 2019-05-07 DIAGNOSIS — N401 Enlarged prostate with lower urinary tract symptoms: Secondary | ICD-10-CM | POA: Diagnosis not present

## 2019-05-07 DIAGNOSIS — R351 Nocturia: Secondary | ICD-10-CM | POA: Diagnosis not present

## 2019-06-04 ENCOUNTER — Other Ambulatory Visit: Payer: Self-pay | Admitting: Family Medicine

## 2019-07-04 ENCOUNTER — Other Ambulatory Visit: Payer: Self-pay | Admitting: Family Medicine

## 2019-07-05 ENCOUNTER — Other Ambulatory Visit: Payer: Self-pay | Admitting: Family Medicine

## 2019-07-05 MED ORDER — ACETAMINOPHEN-CODEINE #3 300-30 MG PO TABS: 1.0000 | ORAL_TABLET | Freq: Four times a day (QID) | ORAL | 0 refills | Status: DC | PRN

## 2019-07-05 NOTE — Telephone Encounter (Signed)
Rx request 

## 2019-07-08 ENCOUNTER — Other Ambulatory Visit: Payer: Self-pay

## 2019-07-09 ENCOUNTER — Encounter: Payer: Self-pay | Admitting: Family Medicine

## 2019-07-09 ENCOUNTER — Ambulatory Visit (INDEPENDENT_AMBULATORY_CARE_PROVIDER_SITE_OTHER): Payer: Medicare Other | Admitting: Family Medicine

## 2019-07-09 VITALS — BP 128/76 | HR 73 | Temp 97.1°F | Ht 74.0 in | Wt 241.4 lb

## 2019-07-09 DIAGNOSIS — I2510 Atherosclerotic heart disease of native coronary artery without angina pectoris: Secondary | ICD-10-CM

## 2019-07-09 DIAGNOSIS — E1169 Type 2 diabetes mellitus with other specified complication: Secondary | ICD-10-CM

## 2019-07-09 DIAGNOSIS — E782 Mixed hyperlipidemia: Secondary | ICD-10-CM

## 2019-07-09 DIAGNOSIS — B354 Tinea corporis: Secondary | ICD-10-CM

## 2019-07-09 DIAGNOSIS — G894 Chronic pain syndrome: Secondary | ICD-10-CM | POA: Diagnosis not present

## 2019-07-09 DIAGNOSIS — M5416 Radiculopathy, lumbar region: Secondary | ICD-10-CM | POA: Diagnosis not present

## 2019-07-09 DIAGNOSIS — I10 Essential (primary) hypertension: Secondary | ICD-10-CM

## 2019-07-09 DIAGNOSIS — E1142 Type 2 diabetes mellitus with diabetic polyneuropathy: Secondary | ICD-10-CM | POA: Diagnosis not present

## 2019-07-09 DIAGNOSIS — Z23 Encounter for immunization: Secondary | ICD-10-CM

## 2019-07-09 DIAGNOSIS — J449 Chronic obstructive pulmonary disease, unspecified: Secondary | ICD-10-CM | POA: Diagnosis not present

## 2019-07-09 HISTORY — DX: Mixed hyperlipidemia: E78.2

## 2019-07-09 HISTORY — DX: Mixed hyperlipidemia: E11.69

## 2019-07-09 LAB — POCT GLYCOSYLATED HEMOGLOBIN (HGB A1C): Hemoglobin A1C: 6.5 % — AB (ref 4.0–5.6)

## 2019-07-09 MED ORDER — TAMSULOSIN HCL 0.4 MG PO CAPS: 0.4000 mg | ORAL_CAPSULE | Freq: Two times a day (BID) | ORAL | Status: DC

## 2019-07-09 MED ORDER — PREDNISONE 10 MG PO TABS: ORAL_TABLET | ORAL | 0 refills | Status: DC

## 2019-07-09 MED ORDER — KETOCONAZOLE 2 % EX CREA: 1.0000 "application " | TOPICAL_CREAM | Freq: Two times a day (BID) | CUTANEOUS | 0 refills | Status: AC

## 2019-07-09 MED ORDER — METFORMIN HCL 500 MG PO TABS: 500.0000 mg | ORAL_TABLET | Freq: Two times a day (BID) | ORAL | 3 refills | Status: DC

## 2019-07-09 MED ORDER — FARXIGA 10 MG PO TABS: 10.0000 mg | ORAL_TABLET | Freq: Every day | ORAL | 11 refills | Status: DC

## 2019-07-09 NOTE — Progress Notes (Signed)
Subjective  CC:  Chief Complaint  Patient presents with  . Diabetes    Takes Farxiga. No side effects. Diet is good. Exercises daily.  . Hypertension    Does not log readings at home. No HA, dizziness, or visual changes  . Hyperlipidemia    Takes atorvastatin. No side efects    HPI: Jason Snyder is a 69 y.o. male who presents to the office today for follow up of diabetes and problems listed above in the chief complaint.   Diabetes follow up: His diabetic control is reported as Unchanged. Tolerating farxiga and metformin. No AEs.  He denies exertional CP or SOB or symptomatic hypoglycemia. He denies foot sores or paresthesias. Due pneumovax  HTN: well controlled. Lipids are at goal on statin.   C/o rash on mid upper chest x 3-4 weeks ; started as ring. Mild flaking but not itching or spreading. No associated sxs.   Right low back pain flare; has had SI joint injections by ortho but would prefer to hold off due to covid; flared due to putting down new flooring in son's bathroom. Pains at night with some radicular sxs, mild w/o weakness. On chronic tylenol 3.   Copd: doing well during winter months. Not needing maintenance inhaler right now. No exacerbations. Has f/u with pulm soon.  Wt Readings from Last 3 Encounters:  07/09/19 241 lb 6.4 oz (109.5 kg)  04/08/19 244 lb 14.9 oz (111.1 kg)  04/08/19 245 lb (111.1 kg)    BP Readings from Last 3 Encounters:  07/09/19 128/76  04/08/19 136/78  04/08/19 136/78    Assessment  1. Type 2 diabetes mellitus with peripheral neuropathy (HCC)   2. Essential hypertension   3. Coronary artery disease involving native coronary artery of native heart without angina pectoris   4. Chronic pain syndrome   5. Combined hyperlipidemia associated with type 2 diabetes mellitus (HCC)   6. Lumbar radiculopathy   7. Tinea corporis   8. Chronic obstructive pulmonary disease, unspecified COPD type ([place])      Plan   Diabetes is currently very well  controlled. Continue same meds. Pneumovax updated today.   HTN and lipids at goal on meds/arb  CAD: stable  Chronic pain with radiculopathy flare: steroid taper and f/u with ortho if not improving. Educated on secondary hyperglycemia  Copd: improved.   Presumed tinea corporis: ketoconazole and return if not resolving.  Follow up: Return in about 3 months (around 10/06/2019) for follow up of diabetes and hypertension.. Orders Placed This Encounter  Procedures  . Pneumococcal polysaccharide vaccine 23-valent greater than or equal to 2yo subcutaneous/IM  . POCT glycosylated hemoglobin (Hb A1C)   Meds ordered this encounter  Medications  . dapagliflozin propanediol (FARXIGA) 10 MG TABS tablet    Sig: Take 10 mg by mouth daily.    Dispense:  30 tablet    Refill:  11    Please keep on file for next due refill. Thanks.  . predniSONE (DELTASONE) 10 MG tablet    Sig: Take 4 tabs qd x 2 days, 3 qd x 2 days, 2 qd x 2d, 1qd x 3 days    Dispense:  21 tablet    Refill:  0  . metFORMIN (GLUCOPHAGE) 500 MG tablet    Sig: Take 1 tablet (500 mg total) by mouth 2 (two) times daily with a meal.    Dispense:  180 tablet    Refill:  3  . tamsulosin (FLOMAX) 0.4 MG CAPS capsule  Sig: Take 1 capsule (0.4 mg total) by mouth 2 (two) times daily.  Marland Kitchen ketoconazole (NIZORAL) 2 % cream    Sig: Apply 1 application topically 2 (two) times daily for 7 days. Then as needed    Dispense:  30 g    Refill:  0      Immunization History  Administered Date(s) Administered  . Fluad Quad(high Dose 65+) 02/02/2019  . Hepatitis B, ped/adol 06/04/2003  . Influenza Split 03/03/2010  . Influenza, High Dose Seasonal PF 03/21/2017, 03/03/2018  . Influenza, Seasonal, Injecte, Preservative Fre 03/23/2014, 04/12/2015  . Influenza,inj,Quad PF,6+ Mos 04/01/2016  . Pneumococcal Conjugate-13 06/06/2018  . Pneumococcal Polysaccharide-23 06/04/2003  . Td 06/04/2003  . Tdap 01/10/2015  . Zoster 09/18/2015    Diabetes  Related Lab Review: Lab Results  Component Value Date   HGBA1C 6.5 (A) 07/09/2019   HGBA1C 6.6 (A) 04/08/2019   HGBA1C 7.0 (A) 01/12/2019    Lab Results  Component Value Date   MICROALBUR 11.1 (H) 04/08/2019   Lab Results  Component Value Date   CREATININE 0.95 04/08/2019   BUN 14 04/08/2019   NA 138 04/08/2019   K 4.8 04/08/2019   CL 99 04/08/2019   CO2 30 04/08/2019   Lab Results  Component Value Date   CHOL 138 04/08/2019   CHOL 149 04/06/2018   Lab Results  Component Value Date   HDL 38.60 (L) 04/08/2019   HDL 42.20 04/06/2018   Lab Results  Component Value Date   LDLCALC 78 04/08/2019   LDLCALC 80 04/06/2018   LDLCALC 68 04/21/2017   Lab Results  Component Value Date   TRIG 106.0 04/08/2019   TRIG 137.0 04/06/2018   Lab Results  Component Value Date   CHOLHDL 4 04/08/2019   CHOLHDL 4 04/06/2018   No results found for: LDLDIRECT The 10-year ASCVD risk score Mikey Bussing DC Jr., et al., 2013) is: 33.7%   Values used to calculate the score:     Age: 83 years     Sex: Male     Is Non-Hispanic African American: No     Diabetic: Yes     Tobacco smoker: Yes     Systolic Blood Pressure: 0000000 mmHg     Is BP treated: Yes     HDL Cholesterol: 38.6 mg/dL     Total Cholesterol: 138 mg/dL I have reviewed the PMH, Fam and Soc history. Patient Active Problem List   Diagnosis Date Noted  . Coronary artery disease involving native coronary artery of native heart without angina pectoris     Priority: High    Cardiac Cath 01/2019: Severe single-vessel disease with additional moderate two-vessel disease: 100% CTO of RCA, moderate disease in proximal LCx, distal LCx and very small caliber 1st Diag DFR-FFR negative lesion in proximal LCx Normal LV pressures with previously document, ed normal EF.   Marland Kitchen Smoking greater than 30 pack years - quit 03/2018 10/03/2017    Priority: High  . Type 2 diabetes mellitus with peripheral neuropathy ([place]) 07/12/2014    Priority: High  . COPD  (chronic obstructive pulmonary disease) ([place]) 01/07/2014    Priority: High    PFTs 07/2018 minimal airway obstruction, overinflation and no response to bronchodilators.    . Chronic pain 10/07/2013    Priority: High  . OSA on CPAP 10/07/2013    Priority: High  . Essential hypertension 08/31/2010    Priority: High    Elevated ASCVD risk - started lipitor 04/2013   . Obesity (BMI 30.0-34.9)  10/03/2017    Priority: Medium  . BPH without obstruction/lower urinary tract symptoms 10/03/2017    Priority: Medium  . Gastroesophageal reflux disease 01/10/2015    Priority: Medium  . Primary osteoarthritis involving multiple joints 01/10/2015    Priority: Medium  . 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    Priority: Medium    Hypogonadism - pineal glad abnormalities On testosterone replacement   . Rosacea 10/03/2017    Priority: Low  . 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    Priority: Low  . Combined hyperlipidemia associated with type 2 diabetes mellitus ([place]) 07/09/2019  . Encounter for screening for lung cancer 11/09/2018    10/2018: Chest CT: neg lung cancer: + COPD changes and cardiovascular calcifications     Social History: Patient  reports that he quit smoking about 16 months ago. His smoking use included cigarettes. He has a 15.00 pack-year smoking history. He has never used smokeless tobacco. He reports previous alcohol use. He reports previous drug use.  Review of Systems: Ophthalmic: negative for eye pain, loss of vision or double vision Cardiovascular: negative for chest pain Respiratory: negative for SOB or persistent cough Gastrointestinal: negative for abdominal pain Genitourinary: negative for dysuria or gross hematuria MSK: negative for foot lesions Neurologic: negative for weakness or gait disturbance  Objective  Vitals: BP 128/76 (BP Location: Right Arm, Patient Position: Sitting, Cuff Size: Large)   Pulse 73   Temp (!) 97.1 F (36.2 C) (Temporal)   Ht 6\' 2"  (1.88 m)    Wt 241 lb 6.4 oz (109.5 kg)   SpO2 95%   BMI 30.99 kg/m  General: well appearing, no acute distress  Psych:  Alert and oriented, normal mood and affect HEENT:  Normocephalic, atraumatic,  supple neck  Cardiovascular:  Nl S1 and S2, RRR without murmur, gallop or rub. no edema Skin:  Warm, over sternum red papular rash with central ring and flaking border, no warmth or ttp     Diabetic education: ongoing education regarding chronic disease management for diabetes was given today. We continue to reinforce the ABC's of diabetic management: A1c (<7 or 8 dependent upon patient), tight blood pressure control, and cholesterol management with goal LDL < 100 minimally. We discuss diet strategies, exercise recommendations, medication options and possible side effects. At each visit, we review recommended immunizations and preventive care recommendations for diabetics and stress that good diabetic control can prevent other problems. See below for this patient's data.    Commons side effects, risks, benefits, and alternatives for medications and treatment plan prescribed today were discussed, and the patient expressed understanding of the given instructions. Patient is instructed to call or message via MyChart if he/she has any questions or concerns regarding our treatment plan. No barriers to understanding were identified. We discussed Red Flag symptoms and signs in detail. Patient expressed understanding regarding what to do in case of urgent or emergency type symptoms.   Medication list was reconciled, printed and provided to the patient in AVS. Patient instructions and summary information was reviewed with the patient as documented in the AVS. This note was prepared with assistance of Dragon voice recognition software. Occasional wrong-word or sound-a-like substitutions may have occurred due to the inherent limitations of voice recognition software  This visit occurred during the SARS-CoV-2 public  health emergency.  Safety protocols were in place, including screening questions prior to the visit, additional usage of staff PPE, and extensive cleaning of exam room while observing appropriate contact time as indicated for disinfecting solutions.

## 2019-07-09 NOTE — Patient Instructions (Addendum)
Please return in 3 months for diabetes follow up  Today you were given your pneumovax vaccination.    If you have any questions or concerns, please don't hesitate to send me a message via MyChart or call the office at [phone]. Thank you for visiting with [place] today! It's our pleasure caring for you.

## 2019-07-14 ENCOUNTER — Other Ambulatory Visit: Payer: Self-pay | Admitting: Family Medicine

## 2019-07-29 DIAGNOSIS — R3912 Poor urinary stream: Secondary | ICD-10-CM | POA: Diagnosis not present

## 2019-07-29 DIAGNOSIS — R3914 Feeling of incomplete bladder emptying: Secondary | ICD-10-CM | POA: Diagnosis not present

## 2019-07-29 DIAGNOSIS — N401 Enlarged prostate with lower urinary tract symptoms: Secondary | ICD-10-CM | POA: Diagnosis not present

## 2019-07-29 DIAGNOSIS — E349 Endocrine disorder, unspecified: Secondary | ICD-10-CM | POA: Diagnosis not present

## 2019-07-30 ENCOUNTER — Ambulatory Visit: Payer: Medicare Other | Attending: Internal Medicine

## 2019-07-30 DIAGNOSIS — Z23 Encounter for immunization: Secondary | ICD-10-CM | POA: Insufficient documentation

## 2019-07-30 NOTE — Progress Notes (Signed)
   Covid-19 Vaccination Clinic  Name:  Demaury Barahona    MRN: [id number] DOB: September 26, 1950  07/30/2019  Mr. Riegel was observed post Covid-19 immunization for 15 minutes without incidence. He was provided with Vaccine Information Sheet and instruction to access the V-Safe system.   Mr. Strenger was instructed to call 911 with any severe reactions post vaccine: Marland Kitchen Difficulty breathing  . Swelling of your face and throat  . A fast heartbeat  . A bad rash all over your body  . Dizziness and weakness    Immunizations Administered    Name Date Dose VIS Date Route   Pfizer COVID-19 Vaccine 07/30/2019 10:04 AM 0.3 mL 05/14/2019 Intramuscular   Manufacturer: [place]   Lot: [id number]   [place]: [id number]

## 2019-08-03 ENCOUNTER — Other Ambulatory Visit: Payer: Self-pay | Admitting: Family Medicine

## 2019-08-24 ENCOUNTER — Ambulatory Visit: Payer: Medicare Other | Attending: Internal Medicine

## 2019-08-24 DIAGNOSIS — Z23 Encounter for immunization: Secondary | ICD-10-CM

## 2019-08-24 NOTE — Progress Notes (Signed)
   Covid-19 Vaccination Clinic  Name:  Jason Snyder    MRN: [id number] DOB: [birth year]  08/24/2019  [name] was observed post Covid-19 immunization for 15 minutes without incident. He was provided with Vaccine Information Sheet and instruction to access the V-Safe system.   [name] was instructed to call 911 with any severe reactions post vaccine: Marland Kitchen Difficulty breathing  . Swelling of face and throat  . A fast heartbeat  . A bad rash all over body  . Dizziness and weakness   Immunizations Administered    Name Date Dose VIS Date Route   Pfizer COVID-19 Vaccine 08/24/2019 11:36 AM 0.3 mL 05/14/2019 Intramuscular   Manufacturer: [place]   Lot: [id number]   [place]: [id number]

## 2019-08-30 ENCOUNTER — Other Ambulatory Visit: Payer: Self-pay | Admitting: Family Medicine

## 2019-08-30 MED ORDER — CYCLOBENZAPRINE HCL 10 MG PO TABS: 10.0000 mg | ORAL_TABLET | Freq: Three times a day (TID) | ORAL | 0 refills | Status: DC | PRN

## 2019-08-30 MED ORDER — ACETAMINOPHEN-CODEINE #3 300-30 MG PO TABS: 1.0000 | ORAL_TABLET | Freq: Four times a day (QID) | ORAL | 0 refills | Status: DC | PRN

## 2019-08-30 NOTE — Telephone Encounter (Signed)
Pt requesting medications below:   LOV: 11/25/2018 Upcoming visit: 10/06/2019  Please Advise.

## 2019-09-05 ENCOUNTER — Other Ambulatory Visit: Payer: Self-pay | Admitting: Family Medicine

## 2019-09-16 NOTE — Telephone Encounter (Signed)
Dr. Vaughan Browner,   This message was received for you today.  Dr. Vaughan Browner : I am finding as the humidity has been increasing over the past weeks I am experiencing some difficulty breathing that my albuterol inhaler is not optimally improving.  If you look back in my records you will see we switched from [place] to [place] as Stiolto was not in [employer]. The Bevespi was not as effective and when the humidity dropped I didn't need the daily dose.   I switched carriers during open enrolment to one that covers Stiolto  in anticipation of the summer months. If you would send in a prescription to [facility] I would appreciate it. I probably should see you for a check up and if you concur I will schedule an appointment .  Thanks Sela Hilding   Message routed to Dr Vaughan Browner

## 2019-09-17 MED ORDER — STIOLTO RESPIMAT 2.5-2.5 MCG/ACT IN AERS: 2.0000 | INHALATION_SPRAY | Freq: Every day | RESPIRATORY_TRACT | 2 refills | Status: DC

## 2019-09-17 NOTE — Telephone Encounter (Signed)
We will send in a prescription for stiolto as it is covered by your insurance company now  Please make follow up visit in clinic as well

## 2019-10-06 ENCOUNTER — Ambulatory Visit (INDEPENDENT_AMBULATORY_CARE_PROVIDER_SITE_OTHER): Payer: Medicare Other | Admitting: Family Medicine

## 2019-10-06 ENCOUNTER — Other Ambulatory Visit: Payer: Self-pay

## 2019-10-06 ENCOUNTER — Encounter: Payer: Self-pay | Admitting: Family Medicine

## 2019-10-06 VITALS — BP 132/80 | HR 71 | Temp 97.9°F | Resp 15 | Ht 74.0 in | Wt 239.0 lb

## 2019-10-06 DIAGNOSIS — H60543 Acute eczematoid otitis externa, bilateral: Secondary | ICD-10-CM

## 2019-10-06 DIAGNOSIS — I10 Essential (primary) hypertension: Secondary | ICD-10-CM

## 2019-10-06 DIAGNOSIS — E1142 Type 2 diabetes mellitus with diabetic polyneuropathy: Secondary | ICD-10-CM | POA: Diagnosis not present

## 2019-10-06 DIAGNOSIS — N400 Enlarged prostate without lower urinary tract symptoms: Secondary | ICD-10-CM | POA: Diagnosis not present

## 2019-10-06 DIAGNOSIS — G894 Chronic pain syndrome: Secondary | ICD-10-CM

## 2019-10-06 DIAGNOSIS — E291 Testicular hypofunction: Secondary | ICD-10-CM | POA: Diagnosis not present

## 2019-10-06 DIAGNOSIS — J449 Chronic obstructive pulmonary disease, unspecified: Secondary | ICD-10-CM

## 2019-10-06 LAB — POCT GLYCOSYLATED HEMOGLOBIN (HGB A1C): Hemoglobin A1C: 6.7 % — AB (ref 4.0–5.6)

## 2019-10-06 MED ORDER — ACETIC ACID 2 % OT SOLN: 4.0000 [drp] | Freq: Two times a day (BID) | OTIC | 2 refills | Status: AC | PRN

## 2019-10-06 MED ORDER — AMLODIPINE BESYLATE 10 MG PO TABS: 10.0000 mg | ORAL_TABLET | Freq: Every day | ORAL | 3 refills | Status: DC

## 2019-10-06 MED ORDER — TRIAMCINOLONE ACETONIDE 0.1 % EX CREA: 1.0000 "application " | TOPICAL_CREAM | Freq: Two times a day (BID) | CUTANEOUS | 0 refills | Status: DC

## 2019-10-06 MED ORDER — CIPROFLOXACIN-DEXAMETHASONE 0.3-0.1 % OT SUSP: 4.0000 [drp] | Freq: Two times a day (BID) | OTIC | 1 refills | Status: DC

## 2019-10-06 NOTE — Progress Notes (Signed)
Subjective  CC:  Chief Complaint  Patient presents with  . Diabetes  . Hypertension    HPI: Jason Snyder is a 69 y.o. male who presents to the office today for follow up of diabetes and problems listed above in the chief complaint.   Diabetes follow up: His diabetic control is reported as Unchanged. Doing well on farxiga and low dose met. No sxs of hyperglycemia. Due eye exam and will schedule later this month.  He denies exertional CP or SOB or symptomatic hypoglycemia. He denies foot sores or paresthesias.   HTN f/u: Feeling well. Taking medications w/o adverse effects. No symptoms of CHF, angina; no palpitations, sob, cp or lower extremity edema. Compliant with meds.   COPD: having more sob since weather change (increased heat and humitidity) so has restarted the stiolto and has f/u with pulmonology. No sxs of infection  BPH on flomax and well controlled. T replacement per urology.   Chronic external ear eczema: swollen and red and itching with some drainage. No pain. Uses corticosporin as needed but now needing it twice a month. Has seen ENT a few years ago - told chronic "battle" and given drops and steroid cream.   Back pain: improved on pred but returned somewhat as he tapered down. Describes upper leg pain after prolonged standing and hard to stand back up if bends/stoops. No weakness. No b/b/ sxs. To see Dr. Ron Agee soon for further eval and "shot".   Wt Readings from Last 3 Encounters:  10/06/19 239 lb (108.4 kg)  07/09/19 241 lb 6.4 oz (109.5 kg)  04/08/19 244 lb 14.9 oz (111.1 kg)    BP Readings from Last 3 Encounters:  10/06/19 132/80  07/09/19 128/76  04/08/19 136/78    Assessment  1. Type 2 diabetes mellitus with peripheral neuropathy (HCC)   2. Essential hypertension   3. Chronic obstructive pulmonary disease, unspecified COPD type ([place])   4. Chronic pain syndrome   5. Eczema of both external ears   6. BPH without obstruction/lower urinary tract symptoms     7. Hypogonadism male      Plan   Diabetes is currently very well controlled. No changes today. imms up to date. Stable labs. To get eye exam.   HTN is controlled. Refilled meds.   COPD: mild worsening symptomatically. Agree with restarting maintenance inhaler.   Back pain: sounds like lumbar stenosis and possible neurogenic claudication: f/u w/ ortho for further eval. Continue gabapentin.   BPH and low T: both controlled on meds  Eczema with significant swelling and inflammation w/o sxs of infection: change to ciprodex for once course, then use TAC cream and vosol.   Follow up: Return in about 6 months (around 04/07/2020) for complete physical, follow up of diabetes and hypertension.. Orders Placed This Encounter  Procedures  . POCT glycosylated hemoglobin (Hb A1C)   Meds ordered this encounter  Medications  . amLODipine (NORVASC) 10 MG tablet    Sig: Take 1 tablet (10 mg total) by mouth daily.    Dispense:  90 tablet    Refill:  3  . ciprofloxacin-dexamethasone (CIPRODEX) OTIC suspension    Sig: Place 4 drops into both ears 2 (two) times daily.    Dispense:  7.5 mL    Refill:  1  . acetic acid 2 % otic solution    Sig: Place 4 drops into both ears 2 (two) times daily as needed.    Dispense:  15 mL    Refill:  2  .  triamcinolone cream (KENALOG) 0.1 %    Sig: Apply 1 application topically 2 (two) times daily. For 2 weeks, then as needed    Dispense:  45 g    Refill:  0      Immunization History  Administered Date(s) Administered  . Fluad Quad(high Dose 65+) 02/02/2019  . Hepatitis B, ped/adol 06/04/2003  . Influenza Split 03/03/2010  . Influenza, High Dose Seasonal PF 03/21/2017, 03/03/2018  . Influenza, Seasonal, Injecte, Preservative Fre 03/23/2014, 04/12/2015  . Influenza,inj,Quad PF,6+ Mos 04/01/2016  . PFIZER SARS-COV-2 Vaccination 07/30/2019, 08/24/2019  . Pneumococcal Conjugate-13 06/06/2018  . Pneumococcal Polysaccharide-23 06/04/2003, 07/09/2019  . Td  06/04/2003  . Tdap 01/10/2015  . Zoster 09/18/2015    Diabetes Related Lab Review: Lab Results  Component Value Date   HGBA1C 6.7 (A) 10/06/2019   HGBA1C 6.5 (A) 07/09/2019   HGBA1C 6.6 (A) 04/08/2019    Lab Results  Component Value Date   MICROALBUR 11.1 (H) 04/08/2019   Lab Results  Component Value Date   CREATININE 0.95 04/08/2019   BUN 14 04/08/2019   NA 138 04/08/2019   K 4.8 04/08/2019   CL 99 04/08/2019   CO2 30 04/08/2019   Lab Results  Component Value Date   CHOL 138 04/08/2019   CHOL 149 04/06/2018   Lab Results  Component Value Date   HDL 38.60 (L) 04/08/2019   HDL 42.20 04/06/2018   Lab Results  Component Value Date   LDLCALC 78 04/08/2019   LDLCALC 80 04/06/2018   LDLCALC 68 04/21/2017   Lab Results  Component Value Date   TRIG 106.0 04/08/2019   TRIG 137.0 04/06/2018   Lab Results  Component Value Date   CHOLHDL 4 04/08/2019   CHOLHDL 4 04/06/2018   No results found for: LDLDIRECT The 10-year ASCVD risk score Mikey Bussing DC Jr., et al., 2013) is: 35.3%   Values used to calculate the score:     Age: 46 years     Sex: Male     Is Non-Hispanic African American: No     Diabetic: Yes     Tobacco smoker: Yes     Systolic Blood Pressure: 540 mmHg     Is BP treated: Yes     HDL Cholesterol: 38.6 mg/dL     Total Cholesterol: 138 mg/dL I have reviewed the PMH, Fam and Soc history. Patient Active Problem List   Diagnosis Date Noted  . Combined hyperlipidemia associated with type 2 diabetes mellitus ([place]) 07/09/2019    Priority: High  . Coronary artery disease involving native coronary artery of native heart without angina pectoris     Priority: High    Cardiac Cath 01/2019: Severe single-vessel disease with additional moderate two-vessel disease: 100% CTO of RCA, moderate disease in proximal LCx, distal LCx and very small caliber 1st Diag DFR-FFR negative lesion in proximal LCx Normal LV pressures with previously document, ed normal EF.   .  Encounter for screening for lung cancer 11/09/2018    Priority: High    10/2018: Chest CT: neg lung cancer: + COPD changes and cardiovascular calcifications   . Smoking greater than 30 pack years - quit 03/2018 10/03/2017    Priority: High  . Type 2 diabetes mellitus with peripheral neuropathy ([place]) 07/12/2014    Priority: High  . COPD (chronic obstructive pulmonary disease) ([place]) 01/07/2014    Priority: High    PFTs 07/2018 minimal airway obstruction, overinflation and no response to bronchodilators.    . Chronic pain 10/07/2013  Priority: High  . OSA on CPAP 10/07/2013    Priority: High  . Essential hypertension 08/31/2010    Priority: High    Elevated ASCVD risk - started lipitor 04/2013   . Obesity (BMI 30.0-34.9) 10/03/2017    Priority: Medium  . BPH without obstruction/lower urinary tract symptoms 10/03/2017    Priority: Medium  . Gastroesophageal reflux disease 01/10/2015    Priority: Medium  . Primary osteoarthritis involving multiple joints 01/10/2015    Priority: Medium  . 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    Priority: Medium    Hypogonadism - pineal glad abnormalities On testosterone replacement   . Rosacea 10/03/2017    Priority: Low  . 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    Priority: Low    Social History: Patient  reports that he quit smoking about 19 months ago. His smoking use included cigarettes. He has a 15.00 pack-year smoking history. He has never used smokeless tobacco. He reports previous alcohol use. He reports previous drug use.  Review of Systems: Ophthalmic: negative for eye pain, loss of vision or double vision Cardiovascular: negative for chest pain Respiratory: negative for SOB or persistent cough Gastrointestinal: negative for abdominal pain Genitourinary: negative for dysuria or gross hematuria MSK: negative for foot lesions Neurologic: negative for weakness or gait disturbance  Objective  Vitals: BP 132/80   Pulse 71   Temp 97.9 F (36.6  C) (Temporal)   Resp 15   Ht 6' 2" (1.88 m)   Wt 239 lb (108.4 kg)   SpO2 97%   BMI 30.69 kg/m  General: well appearing, no acute distress  Psych:  Alert and oriented, normal mood and affect HEENT:  Normocephalic, atraumatic, moist mucous membranes, supple neck . Left EAC swollen and red and nontender, right with flaking, nl TMs Cardiovascular:  Nl S1 and S2, RRR without murmur, gallop or rub. no edema Respiratory:  Good breath sounds bilaterally, CTAB with normal effort, no rales   Diabetic education: ongoing education regarding chronic disease management for diabetes was given today. We continue to reinforce the ABC's of diabetic management: A1c (<7 or 8 dependent upon patient), tight blood pressure control, and cholesterol management with goal LDL < 100 minimally. We discuss diet strategies, exercise recommendations, medication options and possible side effects. At each visit, we review recommended immunizations and preventive care recommendations for diabetics and stress that good diabetic control can prevent other problems. See below for this patient's data.    Commons side effects, risks, benefits, and alternatives for medications and treatment plan prescribed today were discussed, and the patient expressed understanding of the given instructions. Patient is instructed to call or message via MyChart if he/she has any questions or concerns regarding our treatment plan. No barriers to understanding were identified. We discussed Red Flag symptoms and signs in detail. Patient expressed understanding regarding what to do in case of urgent or emergency type symptoms.   Medication list was reconciled, printed and provided to the patient in AVS. Patient instructions and summary information was reviewed with the patient as documented in the AVS. This note was prepared with assistance of Dragon voice recognition software. Occasional wrong-word or sound-a-like substitutions may have occurred due to  the inherent limitations of voice recognition software  This visit occurred during the SARS-CoV-2 public health emergency.  Safety protocols were in place, including screening questions prior to the visit, additional usage of staff PPE, and extensive cleaning of exam room while observing appropriate contact time as indicated for disinfecting solutions.   

## 2019-10-06 NOTE — Patient Instructions (Signed)
Please return in 6 months for your annual complete physical; please come fasting.  Things look great.  Please set up an appointment for a diabetic eye exam and have the results sent to me.   If you have any questions or concerns, please don't hesitate to send me a message via MyChart or call the office at [phone]. Thank you for visiting with [place] today! It's our pleasure caring for you.

## 2019-10-08 ENCOUNTER — Telehealth: Payer: Self-pay | Admitting: Family Medicine

## 2019-10-08 ENCOUNTER — Telehealth: Payer: Self-pay

## 2019-10-08 NOTE — Telephone Encounter (Signed)
Denial letter received for Cipro/Dex Sus 0.3-0.1%. What is the correct medical condition for needing these ear drops?

## 2019-10-08 NOTE — Telephone Encounter (Signed)
PA has been started

## 2019-10-08 NOTE — Telephone Encounter (Signed)
Patient calling stating that [facility] needs prior Auth on the acetic acid 2 % otic solution that was prescribed 10-06-2019. jk

## 2019-10-12 DIAGNOSIS — R948 Abnormal results of function studies of other organs and systems: Secondary | ICD-10-CM | POA: Diagnosis not present

## 2019-10-12 DIAGNOSIS — E349 Endocrine disorder, unspecified: Secondary | ICD-10-CM | POA: Diagnosis not present

## 2019-10-13 ENCOUNTER — Other Ambulatory Visit: Payer: Self-pay | Admitting: Family Medicine

## 2019-10-13 MED ORDER — ALBUTEROL SULFATE HFA 108 (90 BASE) MCG/ACT IN AERS: INHALATION_SPRAY | RESPIRATORY_TRACT | 0 refills | Status: DC

## 2019-10-13 NOTE — Telephone Encounter (Signed)
Please advise 

## 2019-10-13 NOTE — Telephone Encounter (Signed)
Try otitis externa, acute or chronic.

## 2019-10-14 NOTE — Telephone Encounter (Signed)
Appeal in progress

## 2019-10-15 NOTE — Telephone Encounter (Signed)
Pharmacy is calling back to see if something else in for the patient since he has been waiting a week or if the prior auth had been finished.

## 2019-10-18 ENCOUNTER — Telehealth: Payer: Self-pay | Admitting: Family Medicine

## 2019-10-18 ENCOUNTER — Encounter: Payer: Self-pay | Admitting: Family Medicine

## 2019-10-18 NOTE — Telephone Encounter (Signed)
[place]  Is calling from [employer] stating that they received a call from [employer] stating they are not going to cover the ciprofloxacin-dexamethasone ([place]) OTIC suspension that was sent on 10/06/19, asked if the patient still needed it.

## 2019-10-18 NOTE — Telephone Encounter (Signed)
I believe Jason Snyder was going to send again but for dx of acute otitis externa.   Can you see if that has been done? And then notify pt. thanks

## 2019-10-18 NOTE — Telephone Encounter (Signed)
Please advise 

## 2019-10-19 NOTE — Telephone Encounter (Signed)
I have the appeal letter on my desk, starting it this afternoon

## 2019-10-20 ENCOUNTER — Telehealth: Payer: Self-pay | Admitting: Family Medicine

## 2019-10-20 DIAGNOSIS — M545 Low back pain: Secondary | ICD-10-CM | POA: Diagnosis not present

## 2019-10-20 DIAGNOSIS — M47816 Spondylosis without myelopathy or radiculopathy, lumbar region: Secondary | ICD-10-CM | POA: Diagnosis not present

## 2019-10-20 DIAGNOSIS — M461 Sacroiliitis, not elsewhere classified: Secondary | ICD-10-CM | POA: Diagnosis not present

## 2019-10-20 NOTE — Telephone Encounter (Signed)
Caren Griffins from [place] called asking to speak with Psychologist, sport and exercise. Stated she had some questions to ask regarding a prior authorization for this pt. Caren Griffins also stated she faxed over a form with some questions on it and requested to have the medical assistant complete it. Please advise.

## 2019-10-21 ENCOUNTER — Telehealth: Payer: Self-pay | Admitting: Family Medicine

## 2019-10-21 DIAGNOSIS — E349 Endocrine disorder, unspecified: Secondary | ICD-10-CM | POA: Diagnosis not present

## 2019-10-21 DIAGNOSIS — R3914 Feeling of incomplete bladder emptying: Secondary | ICD-10-CM | POA: Diagnosis not present

## 2019-10-21 DIAGNOSIS — N401 Enlarged prostate with lower urinary tract symptoms: Secondary | ICD-10-CM | POA: Diagnosis not present

## 2019-10-21 NOTE — Telephone Encounter (Signed)
Tammy from [place] called requesting for CMA to call her back. Calling about the prior auth for generic ciprodex. Otitis externa caused by pseudomonas. Tammy stated she needs a call back ASAP - will be making a decision this afternoon with or without the information.

## 2019-10-21 NOTE — Telephone Encounter (Signed)
Spoke with Tammy from [facility] and she would like to know if the patient has a bacterial infection? She stated that this is the only way the medication can be approved. She wants to know if it could be Staph or Pseudomonas. Please advise

## 2019-10-21 NOTE — Telephone Encounter (Signed)
Patient is calling back in regard.  Would like to know if this has gotten approved.    Can someone give patient a call back today at [phone].

## 2019-10-21 NOTE — Telephone Encounter (Signed)
Working with Lynelle Smoke from [facility] on trying to get the medication for the patient.

## 2019-10-22 NOTE — Telephone Encounter (Signed)
Yes, painful acute otitis externa due to chronic eczematous changes.

## 2019-10-22 NOTE — Telephone Encounter (Signed)
Spoke with Tammy at [facility] and she stated that the medication has been approved.

## 2019-10-26 ENCOUNTER — Encounter: Payer: Self-pay | Admitting: Family Medicine

## 2019-10-27 NOTE — Telephone Encounter (Signed)
An appeal was done for ciprofloxacin-dexamethasone (CIPRODEX) OTIC suspension. It has been approved by insurance through 06-02-2020. Patient has been notified of the approval.

## 2019-11-03 ENCOUNTER — Ambulatory Visit
Admission: RE | Admit: 2019-11-03 | Discharge: 2019-11-03 | Disposition: A | Discharge status: Home / Self Care | Payer: Medicare Other | Source: Ambulatory Visit | Attending: Acute Care | Admitting: Acute Care

## 2019-11-03 ENCOUNTER — Other Ambulatory Visit: Payer: Self-pay | Admitting: Family Medicine

## 2019-11-03 DIAGNOSIS — Z87891 Personal history of nicotine dependence: Secondary | ICD-10-CM

## 2019-11-03 DIAGNOSIS — Z122 Encounter for screening for malignant neoplasm of respiratory organs: Secondary | ICD-10-CM

## 2019-11-03 MED ORDER — ACETAMINOPHEN-CODEINE #3 300-30 MG PO TABS: 1.0000 | ORAL_TABLET | Freq: Four times a day (QID) | ORAL | 0 refills | Status: DC | PRN

## 2019-11-03 NOTE — Telephone Encounter (Signed)
LAST APPOINTMENT DATE: 10/06/2019   NEXT APPOINTMENT DATE: 04/11/2020   Rx Tylenol #3 LAST REFILL:  08/30/2019  QTY: #[id number]

## 2019-11-04 ENCOUNTER — Other Ambulatory Visit: Payer: Self-pay | Admitting: *Deleted

## 2019-11-04 DIAGNOSIS — Z87891 Personal history of nicotine dependence: Secondary | ICD-10-CM

## 2019-11-04 NOTE — Progress Notes (Signed)
Please call patient and let them  know their  low dose Ct was read as a Lung RADS 1, negative study: no nodules or definitely benign nodules. Radiology recommendation is for a repeat LDCT in 12 months. .Please let them  know we will order and schedule their  annual screening scan for 11/2019. Please let them  know there was notation of CAD on their  scan.  Please remind the patient  that this is a non-gated exam therefore degree or severity of disease  cannot be determined. Please have them  follow up with their PCP regarding potential risk factor modification, dietary therapy or pharmacologic therapy if clinically indicated. Pt.  is  currently on statin therapy. Please place order for annual  screening scan for  11/2020 and fax results to PCP. Thanks so much.

## 2019-11-15 ENCOUNTER — Other Ambulatory Visit: Payer: Self-pay | Admitting: Family Medicine

## 2019-11-15 MED ORDER — TAMSULOSIN HCL 0.4 MG PO CAPS: 0.4000 mg | ORAL_CAPSULE | Freq: Two times a day (BID) | ORAL | 3 refills | Status: DC

## 2019-11-18 ENCOUNTER — Other Ambulatory Visit: Payer: Self-pay

## 2019-11-18 ENCOUNTER — Telehealth: Payer: Self-pay | Admitting: Family Medicine

## 2019-11-18 MED ORDER — TAMSULOSIN HCL 0.4 MG PO CAPS: 0.4000 mg | ORAL_CAPSULE | Freq: Two times a day (BID) | ORAL | 3 refills | Status: DC

## 2019-11-18 NOTE — Telephone Encounter (Signed)
Refill has been sent today 

## 2019-11-18 NOTE — Telephone Encounter (Signed)
MEDICATION: Tamsulosin HCl 0.4 mg Oral 2 times daily  PHARMACY:  [id number] - [place],  - [place]   Comments: Patient called [facility] and they haven't received a refill request.   **Let patient know to contact pharmacy at the end of the day to make sure medication is ready. **  ** Please notify patient to allow 48-72 hours to process**  **Encourage patient to contact the pharmacy for refills or they can request refills through [facility]**

## 2019-11-20 ENCOUNTER — Other Ambulatory Visit: Payer: Self-pay | Admitting: Family Medicine

## 2019-11-25 ENCOUNTER — Encounter: Payer: Self-pay | Admitting: Family Medicine

## 2019-11-25 NOTE — Telephone Encounter (Signed)
Please advise 

## 2019-12-01 ENCOUNTER — Ambulatory Visit (INDEPENDENT_AMBULATORY_CARE_PROVIDER_SITE_OTHER): Payer: Medicare Other | Admitting: Family Medicine

## 2019-12-01 ENCOUNTER — Encounter: Payer: Self-pay | Admitting: Family Medicine

## 2019-12-01 ENCOUNTER — Other Ambulatory Visit: Payer: Self-pay

## 2019-12-01 VITALS — BP 138/76 | HR 68 | Temp 98.7°F | Ht 74.0 in | Wt 236.8 lb

## 2019-12-01 DIAGNOSIS — K429 Umbilical hernia without obstruction or gangrene: Secondary | ICD-10-CM

## 2019-12-01 MED ORDER — AMLODIPINE BESYLATE 10 MG PO TABS: 10.0000 mg | ORAL_TABLET | Freq: Every day | ORAL | 3 refills | Status: DC

## 2019-12-01 NOTE — Patient Instructions (Signed)
Please follow up as scheduled for your next visit with me: 04/11/2020   Please set up an appointment for a diabetic eye exam and have the results sent to me.   I have referred you to a surgeon and we will call you to get that scheduled.   If you have any questions or concerns, please don't hesitate to send me a message via MyChart or call the office at [phone]. Thank you for visiting with [place] today! It's our pleasure caring for you.  Umbilical Hernia, Adult  A hernia is a bulge of tissue that pushes through an opening between muscles. An umbilical hernia happens in the abdomen, near the belly button (umbilicus). The hernia may contain tissues from the small intestine, large intestine, or fatty tissue covering the intestines (omentum). Umbilical hernias in adults tend to get worse over time, and they require surgical treatment. There are several types of umbilical hernias. You may have:  A hernia located just above or below the umbilicus (indirect hernia). This is the most common type of umbilical hernia in adults.  A hernia that forms through an opening formed by the umbilicus (direct hernia).  A hernia that comes and goes (reducible hernia). A reducible hernia may be visible only when you strain, lift something heavy, or cough. This type of hernia can be pushed back into the abdomen (reduced).  A hernia that traps abdominal tissue inside the hernia (incarcerated hernia). This type of hernia cannot be reduced.  A hernia that cuts off blood flow to the tissues inside the hernia (strangulated hernia). The tissues can start to die if this happens. This type of hernia requires emergency treatment. What are the causes? An umbilical hernia happens when tissue inside the abdomen presses on a weak area of the abdominal muscles. What increases the risk? You may have a greater risk of this condition if you:  Are obese.  Have had several pregnancies.  Have a buildup of fluid inside your  abdomen (ascites).  Have had surgery that weakens the abdominal muscles. What are the signs or symptoms? The main symptom of this condition is a painless bulge at or near the belly button. A reducible hernia may be visible only when you strain, lift something heavy, or cough. Other symptoms may include:  Dull pain.  A feeling of pressure. Symptoms of a strangulated hernia may include:  Pain that gets increasingly worse.  Nausea and vomiting.  Pain when pressing on the hernia.  Skin over the hernia becoming red or purple.  Constipation.  Blood in the stool. How is this diagnosed? This condition may be diagnosed based on:  A physical exam. You may be asked to cough or strain while standing. These actions increase the pressure inside your abdomen and force the hernia through the opening in your muscles. Your health care provider may try to reduce the hernia by pressing on it.  Your symptoms and medical history. How is this treated? Surgery is the only treatment for an umbilical hernia. Surgery for a strangulated hernia is done as soon as possible. If you have a small hernia that is not incarcerated, you may need to lose weight before having surgery. Follow these instructions at home:  Lose weight, if told by your health care provider.  Do not try to push the hernia back in.  Watch your hernia for any changes in color or size. Tell your health care provider if any changes occur.  You may need to avoid activities that increase pressure  on your hernia.  Do not lift anything that is heavier than 10 lb (4.5 kg) until your health care provider says that this is safe.  Take over-the-counter and prescription medicines only as told by your health care provider.  Keep all follow-up visits as told by your health care provider. This is important. Contact a health care provider if:  Your hernia gets larger.  Your hernia becomes painful. Get help right away if:  You develop sudden,  severe pain near the area of your hernia.  You have pain as well as nausea or vomiting.  You have pain and the skin over your hernia changes color.  You develop a fever. This information is not intended to replace advice given to you by your health care provider. Make sure you discuss any questions you have with your health care provider. Document Revised: 07/02/2017 Document Reviewed: 11/18/2016 Elsevier Patient Education  [place].

## 2019-12-01 NOTE — Progress Notes (Signed)
Subjective  CC:  Chief Complaint  Patient presents with  . Hernia    painful. had for years    HPI: Jason Snyder is a 69 y.o. male who presents to the office today to address the problems listed above in the chief complaint.  Umbilical hernia: now chronically tender and intermittently painful x the last month. No associated sxs. No n/v/ or severe pain. Ready to consider surgery    Assessment  1. Umbilical hernia without obstruction and without gangrene      Plan   Painful umbilical hernia:  Refer to surgery. Education given. tyelnol advil and gentle reduction if needed. Discussed red flag sxs.  Follow up: Follow up as directed.   04/11/2020 for cpe  Orders Placed This Encounter  Procedures  . Ambulatory referral to General Surgery   Meds ordered this encounter  Medications  . amLODipine (NORVASC) 10 MG tablet    Sig: Take 1 tablet (10 mg total) by mouth daily.    Dispense:  90 tablet    Refill:  3      I reviewed the patients updated PMH, FH, and SocHx.    Patient Active Problem List   Diagnosis Date Noted  . Combined hyperlipidemia associated with type 2 diabetes mellitus ([place]) 07/09/2019    Priority: High  . Coronary artery disease involving native coronary artery of native heart without angina pectoris     Priority: High  . Encounter for screening for lung cancer 11/09/2018    Priority: High  . Smoking greater than 30 pack years - quit 03/2018 10/03/2017    Priority: High  . Type 2 diabetes mellitus with peripheral neuropathy ([place]) 07/12/2014    Priority: High  . COPD (chronic obstructive pulmonary disease) ([place]) 01/07/2014    Priority: High  . Chronic pain 10/07/2013    Priority: High  . OSA on CPAP 10/07/2013    Priority: High  . Essential hypertension 08/31/2010    Priority: High  . Obesity (BMI 30.0-34.9) 10/03/2017    Priority: Medium  . BPH without obstruction/lower urinary tract symptoms 10/03/2017    Priority: Medium  . Gastroesophageal  reflux disease 01/10/2015    Priority: Medium  . Primary osteoarthritis involving multiple joints 01/10/2015    Priority: Medium  . 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    Priority: Medium  . Rosacea 10/03/2017    Priority: Low  . 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    Priority: Low   Current Meds  Medication Sig  . acetaminophen-codeine (TYLENOL #3) 300-30 MG tablet Take 1 tablet by mouth every 6 (six) hours as needed for moderate pain.  Marland Kitchen acetic acid 2 % otic solution Place 4 drops into both ears 2 (two) times daily as needed.  Marland Kitchen albuterol (VENTOLIN HFA) 108 (90 Base) MCG/ACT inhaler INHALE 2 PUFFS BY MOUTH EVERY 4 HOURS AS NEEDED FOR WHEEZING  . amLODipine (NORVASC) 10 MG tablet Take 1 tablet (10 mg total) by mouth daily.  Marland Kitchen aspirin 81 MG EC tablet Take 81 mg by mouth daily.   Marland Kitchen atorvastatin (LIPITOR) 20 MG tablet Take 1 tablet (20 mg total) by mouth at bedtime.  . ciprofloxacin-dexamethasone (CIPRODEX) OTIC suspension Place 4 drops into both ears 2 (two) times daily.  . cyclobenzaprine (FLEXERIL) 10 MG tablet Take 1 tablet (10 mg total) by mouth 3 (three) times daily as needed for muscle spasms.  . dapagliflozin propanediol (FARXIGA) 10 MG TABS tablet Take 10 mg by mouth daily.  . diclofenac sodium (VOLTAREN) 1 % GEL Apply  2 g topically 4 (four) times daily as needed (pain).   . fluticasone (FLONASE) 50 MCG/ACT nasal spray Place 1 spray into the nose daily as needed for allergies.   Marland Kitchen gabapentin (NEURONTIN) 100 MG capsule TAKE ONE TO THREE CAPSULES BY MOUTH AT BEDTIME  . hydrochlorothiazide (HYDRODIURIL) 25 MG tablet Take 12.5 mg by mouth daily.  Marland Kitchen ibuprofen (ADVIL) 200 MG tablet Take 200 mg by mouth 2 (two) times daily as needed for moderate pain.  Marland Kitchen Ketotifen Fumarate (ALLERGY EYE DROPS OP) Place 1 drop into both eyes daily.  . Loratadine (CLARITIN) 10 MG CAPS Take 10 mg by mouth daily as needed (allergies).   . losartan (COZAAR) 100 MG tablet TAKE ONE TABLET BY MOUTH ONE TIME DAILY   .  metFORMIN (GLUCOPHAGE) 500 MG tablet Take 1 tablet (500 mg total) by mouth 2 (two) times daily with a meal.  . metoprolol tartrate (LOPRESSOR) 50 MG tablet Take 1 tablet (50 mg total) by mouth 2 (two) times daily.  . Multiple Vitamin (MULTIVITAMIN) capsule Take 1 capsule by mouth daily.   . naproxen sodium (ALEVE) 220 MG tablet Take 220 mg by mouth daily as needed (pain).  Marland Kitchen neomycin-polymyxin-hydrocortisone (CORTISPORIN) OTIC solution Place 3 drops into the left ear 4 (four) times daily. (Patient taking differently: Place 3 drops into both ears 4 (four) times daily as needed (ear infections). )  . nitroGLYCERIN (NITROSTAT) 0.4 MG SL tablet Place 1 tablet (0.4 mg total) under the tongue every 5 (five) minutes as needed for chest pain.  . tamsulosin (FLOMAX) 0.4 MG CAPS capsule TAKE TWO CAPSULES BY MOUTH DAILY  . testosterone cypionate (DEPOTESTOSTERONE CYPIONATE) 200 MG/ML injection INJECT 1ML INTO THE MUSCLE EVERY 14 DAYS  . Tiotropium Bromide-Olodaterol (STIOLTO RESPIMAT) 2.5-2.5 MCG/ACT AERS Inhale 2 puffs into the lungs daily.  Marland Kitchen triamcinolone cream (KENALOG) 0.1 % Apply 1 application topically 2 (two) times daily. For 2 weeks, then as needed  . [DISCONTINUED] amLODipine (NORVASC) 10 MG tablet Take 1 tablet (10 mg total) by mouth daily.    Allergies: Patient is allergic to rubus fruticosus. Family History: Patient family history includes Heart disease in his father and maternal uncle; Lung cancer in his father and sister; Pulmonary embolism in his mother. Social History:  Patient  reports that he quit smoking about 20 months ago. His smoking use included cigarettes. He has a 15.00 pack-year smoking history. He has never used smokeless tobacco. He reports previous alcohol use. He reports previous drug use.  Review of Systems: Constitutional: Negative for fever malaise or anorexia Cardiovascular: negative for chest pain Respiratory: negative for SOB or persistent cough Gastrointestinal:  negative for abdominal pain  Objective  Vitals: BP 138/76 (BP Location: Right Arm, Patient Position: Sitting, Cuff Size: Large)   Pulse 68   Temp 98.7 F (37.1 C) (Temporal)   Ht 6\' 2"  (1.88 m)   Wt 236 lb 12.8 oz (107.4 kg)   SpO2 95%   BMI 30.40 kg/m  General: no acute distress , A&Ox3 Gastrointestinal: soft, flat abdomen, normal active bowel sounds, no palpable masses, no hepatosplenomegaly, small umbilical hernia with ttp, reducible.       Commons side effects, risks, benefits, and alternatives for medications and treatment plan prescribed today were discussed, and the patient expressed understanding of the given instructions. Patient is instructed to call or message via MyChart if he/she has any questions or concerns regarding our treatment plan. No barriers to understanding were identified. We discussed Red Flag symptoms and signs in detail.  Patient expressed understanding regarding what to do in case of urgent or emergency type symptoms.   Medication list was reconciled, printed and provided to the patient in AVS. Patient instructions and summary information was reviewed with the patient as documented in the AVS. This note was prepared with assistance of Dragon voice recognition software. Occasional wrong-word or sound-a-like substitutions may have occurred due to the inherent limitations of voice recognition software  This visit occurred during the SARS-CoV-2 public health emergency.  Safety protocols were in place, including screening questions prior to the visit, additional usage of staff PPE, and extensive cleaning of exam room while observing appropriate contact time as indicated for disinfecting solutions.

## 2019-12-12 ENCOUNTER — Other Ambulatory Visit: Payer: Self-pay | Admitting: Family Medicine

## 2019-12-13 NOTE — Telephone Encounter (Signed)
Pt is requesting rx refill for Cyclobenzaprine (Flexeril) 10 mg tab.  LOV: 12/01/2019 Next Visit: 04/11/2020  Approve?

## 2019-12-15 ENCOUNTER — Encounter: Payer: Self-pay | Admitting: Pulmonary Disease

## 2019-12-15 ENCOUNTER — Other Ambulatory Visit: Payer: Self-pay

## 2019-12-15 ENCOUNTER — Ambulatory Visit (INDEPENDENT_AMBULATORY_CARE_PROVIDER_SITE_OTHER): Payer: Medicare Other | Admitting: Pulmonary Disease

## 2019-12-15 VITALS — BP 126/80 | HR 66 | Ht 74.0 in | Wt 240.0 lb

## 2019-12-15 DIAGNOSIS — Z87891 Personal history of nicotine dependence: Secondary | ICD-10-CM | POA: Diagnosis not present

## 2019-12-15 DIAGNOSIS — I2510 Atherosclerotic heart disease of native coronary artery without angina pectoris: Secondary | ICD-10-CM | POA: Diagnosis not present

## 2019-12-15 DIAGNOSIS — J449 Chronic obstructive pulmonary disease, unspecified: Secondary | ICD-10-CM

## 2019-12-15 MED ORDER — STIOLTO RESPIMAT 2.5-2.5 MCG/ACT IN AERS: 2.0000 | INHALATION_SPRAY | Freq: Every day | RESPIRATORY_TRACT | 11 refills | Status: DC

## 2019-12-15 MED ORDER — STIOLTO RESPIMAT 2.5-2.5 MCG/ACT IN AERS: 2.0000 | INHALATION_SPRAY | Freq: Every day | RESPIRATORY_TRACT | 6 refills | Status: DC

## 2019-12-15 NOTE — Addendum Note (Signed)
Addended by: Elton Sin on: 12/15/2019 10:15 AM   Modules accepted: Orders

## 2019-12-15 NOTE — Progress Notes (Signed)
Jason Snyder    [id number]    Jan 10, 1951  Primary Care Physician:Andy, Karie Fetch, MD  Referring Physician: [name], [place],  [place] [id number]  Chief complaint: Follow up for COPD, OSA  HPI: 69 year old ex-smoker with history of diabetes, hyperlipidemia, hypertension.  Here for evaluation of COPD. He had recent PFTs that showed mild obstructive airway changes with air trapping Chief complaint is dyspnea on exertion.  He has occasional cough with white mucus.  No wheezing He quit smoking in October 2019 and reports that overall his breathing has improved since then though he is gained some weight.   Initially on Atrovent. Started on [employer] in 2020  Pets: Dog, no cats, birds, farm animals Occupation: Retired Environmental manager facility Exposures: No known exposures, no mold, hot tub, Jacuzzi Smoking history: 50-75-pack-year smoker.  Quit in October 2019 Travel history: Originally from [place].  He had lived in [place] most of his life.  No recent travel Relevant family history: Father died of lung cancer, he was a smoker  Interim history: Doing well with Stiolto.  Has occasional dyspnea exacerbated by heat and humidity.  Outpatient Encounter Medications as of 12/15/2019  Medication Sig  . acetaminophen-codeine (TYLENOL #3) 300-30 MG tablet Take 1 tablet by mouth every 6 (six) hours as needed for moderate pain.  Marland Kitchen acetic acid 2 % otic solution Place 4 drops into both ears 2 (two) times daily as needed.  Marland Kitchen albuterol (VENTOLIN HFA) 108 (90 Base) MCG/ACT inhaler INHALE TWO PUFFS BY MOUTH ONCE EVERY FOUR HOURS AS NEEDED FOR WHEEZING  . amLODipine (NORVASC) 10 MG tablet Take 1 tablet (10 mg total) by mouth daily.  Marland Kitchen aspirin 81 MG EC tablet Take 81 mg by mouth daily.   Marland Kitchen atorvastatin (LIPITOR) 20 MG tablet Take 1 tablet (20 mg total) by mouth at bedtime.  . ciprofloxacin-dexamethasone (CIPRODEX) OTIC suspension Place 4 drops  into both ears 2 (two) times daily.  . cyclobenzaprine (FLEXERIL) 10 MG tablet TAKE ONE TABLET BY MOUTH THREE TIMES DAILY AS NEEDED FOR MUSCLE SPASMS  . dapagliflozin propanediol (FARXIGA) 10 MG TABS tablet Take 10 mg by mouth daily.  . diclofenac sodium (VOLTAREN) 1 % GEL Apply 2 g topically 4 (four) times daily as needed (pain).   . fluticasone (FLONASE) 50 MCG/ACT nasal spray Place 1 spray into the nose daily as needed for allergies.   Marland Kitchen gabapentin (NEURONTIN) 100 MG capsule TAKE ONE TO THREE CAPSULES BY MOUTH AT BEDTIME  . hydrochlorothiazide (HYDRODIURIL) 25 MG tablet Take 12.5 mg by mouth daily.  Marland Kitchen ibuprofen (ADVIL) 200 MG tablet Take 200 mg by mouth 2 (two) times daily as needed for moderate pain.  Marland Kitchen Ketotifen Fumarate (ALLERGY EYE DROPS OP) Place 1 drop into both eyes daily.  . Loratadine (CLARITIN) 10 MG CAPS Take 10 mg by mouth daily as needed (allergies).   . losartan (COZAAR) 100 MG tablet TAKE ONE TABLET BY MOUTH ONE TIME DAILY   . metFORMIN (GLUCOPHAGE) 500 MG tablet Take 1 tablet (500 mg total) by mouth 2 (two) times daily with a meal.  . metoprolol tartrate (LOPRESSOR) 50 MG tablet Take 1 tablet (50 mg total) by mouth 2 (two) times daily.  . Multiple Vitamin (MULTIVITAMIN) capsule Take 1 capsule by mouth daily.   . naproxen sodium (ALEVE) 220 MG tablet Take 220 mg by mouth daily as needed (pain).  Marland Kitchen neomycin-polymyxin-hydrocortisone (CORTISPORIN) OTIC solution Place 3 drops into  the left ear 4 (four) times daily. (Patient taking differently: Place 3 drops into both ears 4 (four) times daily as needed (ear infections). )  . tamsulosin (FLOMAX) 0.4 MG CAPS capsule TAKE TWO CAPSULES BY MOUTH DAILY  . testosterone cypionate (DEPOTESTOSTERONE CYPIONATE) 200 MG/ML injection INJECT 1ML INTO THE MUSCLE EVERY 14 DAYS  . Tiotropium Bromide-Olodaterol (STIOLTO RESPIMAT) 2.5-2.5 MCG/ACT AERS Inhale 2 puffs into the lungs daily.  Marland Kitchen triamcinolone cream (KENALOG) 0.1 % Apply 1 application  topically 2 (two) times daily. For 2 weeks, then as needed  . nitroGLYCERIN (NITROSTAT) 0.4 MG SL tablet Place 1 tablet (0.4 mg total) under the tongue every 5 (five) minutes as needed for chest pain.   No facility-administered encounter medications on file as of 12/15/2019.   Physical Exam: Blood pressure 126/80, pulse 66, height 6\' 2"  (1.88 m), weight 240 lb (108.9 kg), SpO2 97 %. Gen:      No acute distress HEENT:  EOMI, sclera anicteric Neck:     No masses; no thyromegaly Lungs:    Clear to auscultation bilaterally; normal respiratory effort CV:         Regular rate and rhythm; no murmurs Abd:      + bowel sounds; soft, non-tender; no palpable masses, no distension Ext:    No edema; adequate peripheral perfusion Skin:      Warm and dry; no rash Neuro: alert and oriented x 3 Psych: normal mood and affect  Data Reviewed: Imaging: Screening CT chest 11/03/2019-  Centrilobular emphysema, tiny calcified granuloma with no change. I have reviewed the images personally.  PFTs: 07/14/2018 FVC 4.86 [98%], FEV1 3.31 [89%], F/F 68, TLC 123%, DLCO 111% Mild obstruction with overinflation and air trapping.  Labs: CBC 04/06/2018-WBC 7.8, eos 2.1%, absolute eosinophil count 164  Assessment:  Mild COPD Complains of worsening symptoms of chest pressure, tightness Although he has mild COPD since the rest symptoms we will try him on Stiolto inhaler Advised him to work on weight loss which will help with breathing  Abnormal CT chest Screening CT chest reviewed with pulmonary nodules which are likely benign.  Continue annual follow-up He does have coronary and aortic atherosclerosis which has already been evaluated by cardiology by heart cath last year.  OSA Stable on CPAP We will get download of CPAP for review.  Health maintenance 06/06/2018-Prevnar 06/04/2003-Pneumovax  Plan/Recommendations: - Dealer. - Continue CPAP.  Review download.  Marshell Garfinkel MD [facility] 12/15/2019, 9:35 AM  CC: [name], MD

## 2019-12-15 NOTE — Patient Instructions (Signed)
I am glad you are doing well with regard to your breathing We will renew your Stiolto for 1 year Follow-up in 1 year

## 2020-01-03 DIAGNOSIS — K429 Umbilical hernia without obstruction or gangrene: Secondary | ICD-10-CM | POA: Diagnosis not present

## 2020-01-05 ENCOUNTER — Ambulatory Visit: Payer: Self-pay | Admitting: General Surgery

## 2020-01-10 ENCOUNTER — Other Ambulatory Visit: Payer: Self-pay | Admitting: Family Medicine

## 2020-01-10 NOTE — Telephone Encounter (Signed)
LR: 11-03-2019 Qty: 23 w 0 refills Last office visit: 12-01-2019 Upcoming appointment: 04-11-2020

## 2020-01-11 MED ORDER — ACETAMINOPHEN-CODEINE #3 300-30 MG PO TABS: 1.0000 | ORAL_TABLET | Freq: Four times a day (QID) | ORAL | 0 refills | Status: DC | PRN

## 2020-01-11 MED ORDER — ALBUTEROL SULFATE HFA 108 (90 BASE) MCG/ACT IN AERS: INHALATION_SPRAY | RESPIRATORY_TRACT | 0 refills | Status: DC

## 2020-01-14 ENCOUNTER — Other Ambulatory Visit: Payer: Self-pay | Admitting: Family Medicine

## 2020-01-14 LAB — PSA: PSA: 0.86

## 2020-01-14 LAB — TESTOSTERONE: Testosterone: 1046.7

## 2020-01-16 ENCOUNTER — Other Ambulatory Visit: Payer: Self-pay | Admitting: Family Medicine

## 2020-01-20 DIAGNOSIS — R3914 Feeling of incomplete bladder emptying: Secondary | ICD-10-CM | POA: Diagnosis not present

## 2020-01-20 DIAGNOSIS — N401 Enlarged prostate with lower urinary tract symptoms: Secondary | ICD-10-CM | POA: Diagnosis not present

## 2020-01-20 DIAGNOSIS — E230 Hypopituitarism: Secondary | ICD-10-CM | POA: Diagnosis not present

## 2020-01-28 ENCOUNTER — Encounter: Payer: Self-pay | Admitting: Family Medicine

## 2020-02-14 DIAGNOSIS — Z01818 Encounter for other preprocedural examination: Secondary | ICD-10-CM | POA: Diagnosis not present

## 2020-02-18 DIAGNOSIS — K429 Umbilical hernia without obstruction or gangrene: Secondary | ICD-10-CM | POA: Diagnosis not present

## 2020-02-24 ENCOUNTER — Encounter: Payer: Self-pay | Admitting: Family Medicine

## 2020-02-29 ENCOUNTER — Encounter: Payer: Self-pay | Admitting: Family Medicine

## 2020-02-29 ENCOUNTER — Ambulatory Visit (INDEPENDENT_AMBULATORY_CARE_PROVIDER_SITE_OTHER): Payer: Medicare Other

## 2020-02-29 ENCOUNTER — Other Ambulatory Visit: Payer: Self-pay

## 2020-02-29 DIAGNOSIS — Z23 Encounter for immunization: Secondary | ICD-10-CM | POA: Diagnosis not present

## 2020-03-02 ENCOUNTER — Other Ambulatory Visit: Payer: Self-pay | Admitting: Family Medicine

## 2020-03-07 LAB — HM DIABETES EYE EXAM

## 2020-03-08 ENCOUNTER — Encounter: Payer: Self-pay | Admitting: Family Medicine

## 2020-03-15 DIAGNOSIS — M5136 Other intervertebral disc degeneration, lumbar region: Secondary | ICD-10-CM | POA: Insufficient documentation

## 2020-03-15 DIAGNOSIS — I10 Essential (primary) hypertension: Secondary | ICD-10-CM | POA: Insufficient documentation

## 2020-03-15 DIAGNOSIS — Z9889 Other specified postprocedural states: Secondary | ICD-10-CM | POA: Insufficient documentation

## 2020-03-15 DIAGNOSIS — Z6830 Body mass index (BMI) 30.0-30.9, adult: Secondary | ICD-10-CM | POA: Insufficient documentation

## 2020-03-15 DIAGNOSIS — E785 Hyperlipidemia, unspecified: Secondary | ICD-10-CM | POA: Insufficient documentation

## 2020-03-15 DIAGNOSIS — M51369 Other intervertebral disc degeneration, lumbar region without mention of lumbar back pain or lower extremity pain: Secondary | ICD-10-CM | POA: Insufficient documentation

## 2020-03-15 DIAGNOSIS — M5414 Radiculopathy, thoracic region: Secondary | ICD-10-CM | POA: Insufficient documentation

## 2020-03-15 DIAGNOSIS — M5441 Lumbago with sciatica, right side: Secondary | ICD-10-CM | POA: Insufficient documentation

## 2020-03-15 DIAGNOSIS — E119 Type 2 diabetes mellitus without complications: Secondary | ICD-10-CM | POA: Insufficient documentation

## 2020-03-15 DIAGNOSIS — I2510 Atherosclerotic heart disease of native coronary artery without angina pectoris: Secondary | ICD-10-CM | POA: Insufficient documentation

## 2020-03-15 DIAGNOSIS — E669 Obesity, unspecified: Secondary | ICD-10-CM | POA: Insufficient documentation

## 2020-03-15 DIAGNOSIS — G8929 Other chronic pain: Secondary | ICD-10-CM | POA: Insufficient documentation

## 2020-03-18 ENCOUNTER — Ambulatory Visit: Payer: Medicare Other | Attending: Internal Medicine

## 2020-03-18 DIAGNOSIS — Z23 Encounter for immunization: Secondary | ICD-10-CM

## 2020-03-18 NOTE — Progress Notes (Signed)
   Covid-19 Vaccination Clinic  Name:  Jason Snyder    MRN: [id number] DOB: 11/27/1950  03/18/2020  Jason Snyder was observed post Covid-19 immunization for 15 minutes without incident. He was provided with Vaccine Information Sheet and instruction to access the V-Safe system.   Jason Snyder was instructed to call 911 with any severe reactions post vaccine: Marland Kitchen Difficulty breathing  . Swelling of face and throat  . A fast heartbeat  . A bad rash all over body  . Dizziness and weakness

## 2020-03-19 ENCOUNTER — Other Ambulatory Visit: Payer: Self-pay | Admitting: Family Medicine

## 2020-03-20 MED ORDER — ACETAMINOPHEN-CODEINE #3 300-30 MG PO TABS: 1.0000 | ORAL_TABLET | Freq: Four times a day (QID) | ORAL | 0 refills | Status: DC | PRN

## 2020-03-20 MED ORDER — ALBUTEROL SULFATE HFA 108 (90 BASE) MCG/ACT IN AERS: INHALATION_SPRAY | RESPIRATORY_TRACT | 0 refills | Status: DC

## 2020-03-21 ENCOUNTER — Other Ambulatory Visit: Payer: Self-pay

## 2020-03-21 ENCOUNTER — Ambulatory Visit (INDEPENDENT_AMBULATORY_CARE_PROVIDER_SITE_OTHER): Payer: Medicare Other | Admitting: Cardiology

## 2020-03-21 ENCOUNTER — Encounter: Payer: Self-pay | Admitting: Cardiology

## 2020-03-21 VITALS — BP 144/86 | HR 67 | Ht 74.0 in | Wt 237.1 lb

## 2020-03-21 DIAGNOSIS — E782 Mixed hyperlipidemia: Secondary | ICD-10-CM

## 2020-03-21 DIAGNOSIS — E119 Type 2 diabetes mellitus without complications: Secondary | ICD-10-CM | POA: Diagnosis not present

## 2020-03-21 DIAGNOSIS — I10 Essential (primary) hypertension: Secondary | ICD-10-CM | POA: Diagnosis not present

## 2020-03-21 DIAGNOSIS — I2510 Atherosclerotic heart disease of native coronary artery without angina pectoris: Secondary | ICD-10-CM

## 2020-03-21 NOTE — Patient Instructions (Addendum)
Medication Instructions:  Your physician recommends that you continue on your current medications as directed. Please refer to the Current Medication list given to you today.  *If you need a refill on your cardiac medications before your next appointment, please call your pharmacy*   Lab Work: None If you have labs (blood work) drawn today and your tests are completely normal, you will receive your results only by: Marland Kitchen MyChart Message (if you have MyChart) OR . A paper copy in the mail If you have any lab test that is abnormal or we need to change your treatment, we will call you to review the results.   Testing/Procedures: None   Follow-Up: At [facility], you and your health needs are our priority.  As part of our continuing mission to provide you with exceptional heart care, we have created designated Provider Care Teams.  These Care Teams include your primary Cardiologist (physician) and Advanced Practice Providers (APPs -  Physician Assistants and Nurse Practitioners) who all work together to provide you with the care you need, when you need it.  We recommend signing up for the patient portal called "MyChart".  Sign up information is provided on this After Visit Summary.  MyChart is used to connect with patients for Virtual Visits (Telemedicine).  Patients are able to view lab/test results, encounter notes, upcoming appointments, etc.  Non-urgent messages can be sent to your provider as well.   To learn more about what you can do with MyChart, go to [email or web address].    Your next appointment:   1 year(s)  The format for your next appointment:   In Person  Provider:   Shirlee More, MD   Other Instructions Please check your blood pressure daily at home and call us if it is consistently staying above 140/90  Tips to measure your blood pressure correctly  To determine whether you have hypertension, a medical professional will take a blood pressure reading. How you  prepare for the test, the position of your arm, and other factors can change a blood pressure reading by 10% or more. That could be enough to hide high blood pressure, start you on a drug you don't really need, or lead your doctor to incorrectly adjust your medications. National and international guidelines offer specific instructions for measuring blood pressure. If a doctor, nurse, or medical assistant isn't doing it right, don't hesitate to ask him or her to get with the guidelines. Here's what you can do to ensure a correct reading: . Don't drink a caffeinated beverage or smoke during the 30 minutes before the test. . Sit quietly for five minutes before the test begins. . During the measurement, sit in a chair with your feet on the floor and your arm supported so your elbow is at about heart level. . The inflatable part of the cuff should completely cover at least 80% of your upper arm, and the cuff should be placed on bare skin, not over a shirt. . Don't talk during the measurement. . Have your blood pressure measured twice, with a brief break in between. If the readings are different by 5 points or more, have it done a third time. There are times to break these rules. If you sometimes feel lightheaded when getting out of bed in the morning or when you stand after sitting, you should have your blood pressure checked while seated and then while standing to see if it falls from one position to the next. Because blood pressure varies throughout the  day, your doctor will rarely diagnose hypertension on the basis of a single reading. Instead, he or she will want to confirm the measurements on at least two occasions, usually within a few weeks of one another. The exception to this rule is if you have a blood pressure reading of 180/110 mm Hg or higher. A result this high usually calls for prompt treatment. It's also a good idea to have your blood pressure measured in both arms at least once, since the  reading in one arm (usually the right) may be higher than that in the left. A 2014 study in The American Journal of Medicine of nearly 3,400 people found average arm- to-arm differences in systolic blood pressure of about 5 points. The higher number should be used to make treatment decisions. In 2017, new guidelines from the [employer], the [employer] of Cardiology, and nine other health organizations lowered the diagnosis of high blood pressure to 130/80 mm Hg or higher for all adults. The guidelines also redefined the various blood pressure categories to now include normal, elevated, Stage 1 hypertension, Stage 2 hypertension, and hypertensive crisis (see "Blood pressure categories"). Blood pressure categories  Blood pressure category SYSTOLIC (upper number)  DIASTOLIC (lower number)  Normal Less than 120 mm Hg and Less than 80 mm Hg  Elevated 120-129 mm Hg and Less than 80 mm Hg  High blood pressure: Stage 1 hypertension 130-139 mm Hg or 80-89 mm Hg  High blood pressure: Stage 2 hypertension 140 mm Hg or higher or 90 mm Hg or higher  Hypertensive crisis (consult your doctor immediately) Higher than 180 mm Hg and/or Higher than 120 mm Hg  Source: American Heart Association and American Stroke Association. For more on getting your blood pressure under control, buy Controlling Your Blood Pressure, a Special Health Report from [facility].

## 2020-03-21 NOTE — Progress Notes (Signed)
Cardiology Office Note:    Date:  03/21/2020   ID:  Jason Snyder, DOB 1950-06-13, MRN [id number]  PCP:  [name], MD  Cardiologist:  Shirlee More, MD    Referring MD: [name], MD    ASSESSMENT:    1. Coronary artery disease involving native coronary artery of native heart without angina pectoris   2. Essential hypertension   3. Mixed hyperlipidemia   4. Controlled type 2 diabetes mellitus without complication, without long-term current use of insulin (HCC)    PLAN:    In order of problems listed above:  1. Stable CAD doing well with medical therapy I would continue the same including aspirin high intensity statin beta-blocker nitroglycerin as needed at this time would not repeat coronary angiography 2. Elevated today asked him to check his home blood pressure and if he finds himself consistently greater than 140/90 to contact me and I would switch him to a higher intensity ARB diuretic combination 3. Stable lipids are ideal continue with statin and due for labs with wellness in the next month 4. Well-controlled type 2 diabetes A1c last 6.7% continue current therapy including SGLT2 inhibitor quite beneficial from a cardiovascular protective viewpoint   Next appointment: 1 year   Medication Adjustments/Labs and Tests Ordered: Current medicines are reviewed at length with the patient today.  Concerns regarding medicines are outlined above.  No orders of the defined types were placed in this encounter.  No orders of the defined types were placed in this encounter.   No chief complaint on file.   History of Present Illness:    Jason Snyder is a 69 y.o. male with a hx of coronary artery disease hypertension hyperlipidemia and COPD.  Last seen 01/14/2019. Compliance with diet, lifestyle and medications: Yes  In the interim he has had prostate surgery and has had a herniorrhaphy with a good result.  He is pleased with the quality of his life vigorous active no  angina dyspnea palpitation tolerates his medications including SGLT2 inhibitor and statin without side effects.  He is due for labs with a wellness exam next month his last lipid profile 04/08/2019 showed lipids at target cholesterol 138 LDL 78 triglycerides 106 HDL 39 normal renal function.  EKG today shows sinus rhythm in my office rightward axis first-degree AV block nonspecific conduction delay otherwise normal Past Medical History:  Diagnosis Date  . BMI 30.0-30.9,adult   . BPH without obstruction/lower urinary tract symptoms 10/03/2017  . CAD (coronary artery disease)    cath 2020, single vessel disease, medical mgt  . Chronic left shoulder pain c  . Chronic low back pain with right-sided sciatica   . Chronic pain 10/07/2013  . Combined hyperlipidemia associated with type 2 diabetes mellitus ([place]) 07/09/2019  . Controlled type 2 diabetes mellitus without complication, without long-term current use of insulin ([place]) 07/12/2014  . COPD (chronic obstructive pulmonary disease) ([place]) 01/07/2014   PFTs 07/2018 minimal airway obstruction, overinflation and no response to bronchodilators.   . Coronary artery disease involving native coronary artery of native heart without angina pectoris    Cardiac Cath 01/2019: Severe single-vessel disease with additional moderate two-vessel disease: 100% CTO of RCA, moderate disease in proximal LCx, distal LCx and very small caliber 1st Diag DFR-FFR negative lesion in proximal LCx Normal LV pressures with previously document, ed normal EF.  . DDD (degenerative disc disease), lumbar   . Diabetes mellitus without complication ([place])   . 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 Encounter  for screening for lung cancer 11/09/2018   Chest CT: neg lung cancer: + COPD changes and cardiovascular calcifications  . Essential hypertension 08/31/2010   Elevated ASCVD risk - started lipitor 04/2013  . Gastroesophageal reflux disease 01/10/2015  . History of lumbar surgery   . Hyperlipidemia   .  Hypertension   . 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 Obesity (BMI 30-39.9)   . Obesity (BMI 30.0-34.9) 10/03/2017  . OSA on CPAP 10/07/2013  . Primary osteoarthritis involving multiple joints 01/10/2015  . Rosacea   . Smoking greater than 30 pack years - quit 03/2018 10/03/2017  . Thoracic and lumbosacral neuritis   . Type 2 diabetes mellitus with peripheral neuropathy ([place]) 07/12/2014    Past Surgical History:  Procedure Laterality Date  . ARTHROSCOPIC REPAIR ACL    . CARDIAC CATHETERIZATION    . INTRAVASCULAR PRESSURE WIRE/FFR STUDY N/A 01/01/2019   Procedure: INTRAVASCULAR PRESSURE WIRE/FFR STUDY;  Surgeon: Leonie Man, MD;  Location: [facility];  Service: Cardiovascular;  Laterality: N/A;  . LEFT HEART CATH AND CORONARY ANGIOGRAPHY N/A 01/01/2019   Procedure: LEFT HEART CATH AND CORONARY ANGIOGRAPHY;  Surgeon: Leonie Man, MD;  Location: [facility];  Service: Cardiovascular;  Laterality: N/A;  . PROSTATE CRYOABLATION     03/2019  . SHOULDER ARTHROSCOPY    . SPINE SURGERY      Current Medications: Current Meds  Medication Sig  . acetaminophen-codeine (TYLENOL #3) 300-30 MG tablet Take 1 tablet by mouth every 6 (six) hours as needed for moderate pain.  Marland Kitchen acetic acid 2 % otic solution Place 4 drops into both ears 2 (two) times daily as needed.  Marland Kitchen albuterol (VENTOLIN HFA) 108 (90 Base) MCG/ACT inhaler INHALE TWO PUFFS BY MOUTH ONCE EVERY FOUR HOURS AS NEEDED FOR WHEEZING  . amLODipine (NORVASC) 10 MG tablet Take 1 tablet (10 mg total) by mouth daily.  Marland Kitchen aspirin 81 MG EC tablet Take 81 mg by mouth daily.   Marland Kitchen atorvastatin (LIPITOR) 20 MG tablet Take 1 tablet (20 mg total) by mouth at bedtime.  . ciprofloxacin-dexamethasone (CIPRODEX) OTIC suspension Place 4 drops into both ears 2 (two) times daily.  . cyclobenzaprine (FLEXERIL) 10 MG tablet TAKE ONE TABLET BY MOUTH THREE TIMES DAILY AS NEEDED FOR  MUSCLE SPASMS  . dapagliflozin propanediol (FARXIGA) 10 MG TABS tablet Take 10 mg by mouth daily.  . fluticasone (FLONASE) 50 MCG/ACT nasal spray Place 1 spray into the nose daily as needed for allergies.   Marland Kitchen gabapentin (NEURONTIN) 100 MG capsule take 1 to 3 capsules by mouth at bedtime  . ibuprofen (ADVIL) 200 MG tablet Take 200 mg by mouth 2 (two) times daily as needed for moderate pain.  Marland Kitchen Ketotifen Fumarate (ALLERGY EYE DROPS OP) Place 1 drop into both eyes daily.  . Loratadine (CLARITIN) 10 MG CAPS Take 10 mg by mouth daily as needed (allergies).   . losartan (COZAAR) 100 MG tablet TAKE ONE TABLET BY MOUTH ONE TIME DAILY  . metFORMIN (GLUCOPHAGE) 500 MG tablet Take 1 tablet (500 mg total) by mouth 2 (two) times daily with a meal.  . metoprolol tartrate (LOPRESSOR) 50 MG tablet Take 1 tablet (50 mg total) by mouth 2 (two) times daily.  . Multiple Vitamin (MULTIVITAMIN) capsule Take 1 capsule by mouth daily.   . naproxen sodium (ALEVE) 220 MG tablet Take 220 mg by mouth daily as needed (pain).  . nitroGLYCERIN (NITROSTAT) 0.4 MG  SL tablet Place 1 tablet (0.4 mg total) under the tongue every 5 (five) minutes as needed for chest pain.  . tamsulosin (FLOMAX) 0.4 MG CAPS capsule TAKE TWO CAPSULES BY MOUTH DAILY  . testosterone cypionate (DEPOTESTOSTERONE CYPIONATE) 200 MG/ML injection INJECT 1ML INTO THE MUSCLE EVERY 14 DAYS  . Tiotropium Bromide-Olodaterol (STIOLTO RESPIMAT) 2.5-2.5 MCG/ACT AERS Inhale 2 puffs into the lungs daily.  Marland Kitchen triamcinolone cream (KENALOG) 0.1 % Apply 1 application topically 2 (two) times daily. For 2 weeks, then as needed  . [DISCONTINUED] ciprofloxacin-dexamethasone (CIPRODEX) OTIC suspension Place 4 drops into both ears 2 (two) times daily.     Allergies:   Rubus fruticosus   Social History   Socioeconomic History  . Marital status: Married    Spouse name: Not on file  . Number of children: Not on file  . Years of education: Not on file  . Highest education  level: Not on file  Occupational History  . Not on file  Tobacco Use  . Smoking status: Former Smoker    Packs/day: 0.50    Years: 30.00    Pack years: 15.00    Types: Cigarettes    Quit date: 03/05/2018    Years since quitting: 2.0  . Smokeless tobacco: Never Used  . Tobacco comment: no cigarettes since 03/05/18  Vaping Use  . Vaping Use: Never used  Substance and Sexual Activity  . Alcohol use: Not Currently  . Drug use: Not Currently  . Sexual activity: Not Currently  Other Topics Concern  . Not on file  Social History Narrative  . Not on file   Social Determinants of Health   Financial Resource Strain:   . Difficulty of Paying Living Expenses: Not on file  Food Insecurity:   . Worried About Charity fundraiser in the Last Year: Not on file  . Ran Out of Food in the Last Year: Not on file  Transportation Needs:   . Lack of Transportation (Medical): Not on file  . Lack of Transportation (Non-Medical): Not on file  Physical Activity:   . Days of Exercise per Week: Not on file  . Minutes of Exercise per Session: Not on file  Stress:   . Feeling of Stress : Not on file  Social Connections:   . Frequency of Communication with Friends and Family: Not on file  . Frequency of Social Gatherings with Friends and Family: Not on file  . Attends Religious Services: Not on file  . Active Member of Clubs or Organizations: Not on file  . Attends Archivist Meetings: Not on file  . Marital Status: Not on file     Family History: The patient's family history includes Heart disease in his father and maternal uncle; Lung cancer in his father and sister; Pulmonary embolism in his mother. ROS:   Please see the history of present illness.    All other systems reviewed and are negative.  EKGs/Labs/Other Studies Reviewed:    The following studies were reviewed today:  EKG:  EKG ordered today and personally reviewed.  The ekg ordered today demonstrates sinus rhythm  first-degree AV block right axis deviation nonspecific conduction delay  Recent Labs: 04/08/2019: ALT 26; BUN 14; Creatinine, Ser 0.95; Hemoglobin 16.5; Platelets 245.0; Potassium 4.8; Sodium 138  Recent Lipid Panel    Component Value Date/Time   CHOL 138 04/08/2019 0851   TRIG 106.0 04/08/2019 0851   HDL 38.60 (L) 04/08/2019 0851   CHOLHDL 4 04/08/2019 0851   VLDL  21.2 04/08/2019 0851   LDLCALC 78 04/08/2019 0851    Physical Exam:    VS:  BP (!) 144/86   Pulse 67   Ht 6\' 2"  (1.88 m)   Wt 237 lb 1.9 oz (107.6 kg)   SpO2 98%   BMI 30.44 kg/m     Wt Readings from Last 3 Encounters:  03/21/20 237 lb 1.9 oz (107.6 kg)  12/15/19 240 lb (108.9 kg)  12/01/19 236 lb 12.8 oz (107.4 kg)     GEN:  Well nourished, well developed in no acute distress HEENT: Normal NECK: No JVD; No carotid bruits LYMPHATICS: No lymphadenopathy CARDIAC: RRR, no murmurs, rubs, gallops RESPIRATORY:  Clear to auscultation without rales, wheezing or rhonchi  ABDOMEN: Soft, non-tender, non-distended MUSCULOSKELETAL:  No edema; No deformity  SKIN: Warm and dry NEUROLOGIC:  Alert and oriented x 3 PSYCHIATRIC:  Normal affect    Signed, Shirlee More, MD  03/21/2020 9:33 AM    [place]

## 2020-03-30 ENCOUNTER — Other Ambulatory Visit: Payer: Self-pay | Admitting: Family Medicine

## 2020-04-04 DIAGNOSIS — M545 Low back pain, unspecified: Secondary | ICD-10-CM | POA: Diagnosis not present

## 2020-04-11 ENCOUNTER — Ambulatory Visit (INDEPENDENT_AMBULATORY_CARE_PROVIDER_SITE_OTHER): Payer: Medicare Other | Admitting: Family Medicine

## 2020-04-11 ENCOUNTER — Encounter: Payer: Self-pay | Admitting: Family Medicine

## 2020-04-11 ENCOUNTER — Other Ambulatory Visit: Payer: Self-pay

## 2020-04-11 VITALS — BP 146/82 | HR 67 | Temp 98.6°F | Wt 237.2 lb

## 2020-04-11 DIAGNOSIS — G4733 Obstructive sleep apnea (adult) (pediatric): Secondary | ICD-10-CM | POA: Diagnosis not present

## 2020-04-11 DIAGNOSIS — Z9989 Dependence on other enabling machines and devices: Secondary | ICD-10-CM | POA: Diagnosis not present

## 2020-04-11 DIAGNOSIS — N400 Enlarged prostate without lower urinary tract symptoms: Secondary | ICD-10-CM | POA: Diagnosis not present

## 2020-04-11 DIAGNOSIS — E1169 Type 2 diabetes mellitus with other specified complication: Secondary | ICD-10-CM

## 2020-04-11 DIAGNOSIS — I10 Essential (primary) hypertension: Secondary | ICD-10-CM

## 2020-04-11 DIAGNOSIS — J449 Chronic obstructive pulmonary disease, unspecified: Secondary | ICD-10-CM

## 2020-04-11 DIAGNOSIS — E782 Mixed hyperlipidemia: Secondary | ICD-10-CM | POA: Diagnosis not present

## 2020-04-11 DIAGNOSIS — G894 Chronic pain syndrome: Secondary | ICD-10-CM

## 2020-04-11 DIAGNOSIS — I2510 Atherosclerotic heart disease of native coronary artery without angina pectoris: Secondary | ICD-10-CM | POA: Diagnosis not present

## 2020-04-11 DIAGNOSIS — E1142 Type 2 diabetes mellitus with diabetic polyneuropathy: Secondary | ICD-10-CM | POA: Diagnosis not present

## 2020-04-11 LAB — POCT GLYCOSYLATED HEMOGLOBIN (HGB A1C): Hemoglobin A1C: 6.2 % — AB (ref 4.0–5.6)

## 2020-04-11 MED ORDER — ATORVASTATIN CALCIUM 20 MG PO TABS
20.0000 mg | ORAL_TABLET | Freq: Every day | ORAL | 0 refills | Status: DC
Start: 2020-04-11 — End: 2020-07-12

## 2020-04-11 MED ORDER — LOSARTAN POTASSIUM-HCTZ 100-25 MG PO TABS: 1.0000 | ORAL_TABLET | Freq: Every day | ORAL | 3 refills | Status: DC

## 2020-04-11 MED ORDER — GABAPENTIN 300 MG PO CAPS
300.0000 mg | ORAL_CAPSULE | Freq: Two times a day (BID) | ORAL | 3 refills | Status: DC
Start: 2020-04-11 — End: 2020-05-08

## 2020-04-11 MED ORDER — METOPROLOL TARTRATE 50 MG PO TABS: 50.0000 mg | ORAL_TABLET | Freq: Two times a day (BID) | ORAL | 3 refills | Status: AC

## 2020-04-11 MED ORDER — ACETAMINOPHEN-CODEINE #3 300-30 MG PO TABS: 1.0000 | ORAL_TABLET | Freq: Four times a day (QID) | ORAL | 0 refills | Status: DC | PRN

## 2020-04-11 NOTE — Progress Notes (Signed)
Subjective  Chief Complaint  Patient presents with  . Hypertension    recently stopped hydrochlorthiazide per Dr Rosemarie Ax  . Diabetes    HPI: Jason Snyder is a 69 y.o. male who presents to [facility] today for a Male Wellness Visit. He also has the concerns and/or needs as listed above in the chief complaint. These will be addressed in addition to the Health Maintenance Visit.   Wellness Visit: annual visit with health maintenance review and exam    HM: screens are all up to date. Doing well. Bday was yesterday. Happy.  Lifestyle: Body mass index is 30.45 kg/m. Wt Readings from Last 3 Encounters:  04/11/20 237 lb 3.2 oz (107.6 kg)  03/21/20 237 lb 1.9 oz (107.6 kg)  12/15/19 240 lb (108.9 kg)     Chronic disease management visit and/or acute problem visit:  Diabetes follow up: His diabetic control is reported as Unchanged. Well-controlled.  Tolerating [place].  Diet remains good He denies exertional CP or SOB or symptomatic hypoglycemia. He denies foot sores or paresthesias.  Eye exam up-to-date.  Immunizations are up-to-date.  Complains of diabetic neuropathy pain.  On gabapentin nightly.  Symptoms have improved although still are bothersome especially with lying down over the day or at night.  Hypertension: He stopped HCTZ when we started [place].  Since his blood pressure is running borderline.  Typically 130s to 150s over 80s.  I reviewed cardiology notes.  Coronary disease is stable.  Cardiology notes have been reviewed.  No further testing indicated at this time.  No chest pain.  Continues on aspirin and beta-blocker.  Hyperlipidemia on statin.  Due for recheck.  Tolerating well without myalgias.  Chronic back pain on Tylenol 3 seeing orthopedics.  He has been receiving some steroid injections into the hip and back.  They are trying to figure it out.  He recently had an MRI.  Waiting for review and follow-up on that.  He is stable without new  symptoms.  No neurologic symptoms.  Sleep apnea on CPAP  Hypotestosterone on replacement  BPH  Status post umbilical hernia repair without complications.   Immunization History  Administered Date(s) Administered  . Fluad Quad(high Dose 65+) 02/02/2019, 02/29/2020  . Hepatitis B, ped/adol 06/04/2003  . Influenza Split 03/03/2010  . Influenza, High Dose Seasonal PF 03/21/2017, 03/03/2018  . Influenza, Seasonal, Injecte, Preservative Fre 03/23/2014, 04/12/2015  . Influenza,inj,Quad PF,6+ Mos 04/01/2016  . PFIZER SARS-COV-2 Vaccination 07/30/2019, 08/24/2019, 03/18/2020  . Pneumococcal Conjugate-13 06/06/2018  . Pneumococcal Polysaccharide-23 06/04/2003, 07/09/2019  . Td 06/04/2003  . Tdap 01/10/2015  . Zoster 09/18/2015    Diabetes Related Lab Review: Lab Results  Component Value Date   HGBA1C 6.2 (A) 04/11/2020   HGBA1C 6.7 (A) 10/06/2019   HGBA1C 6.5 (A) 07/09/2019    Lab Results  Component Value Date   MICROALBUR 11.1 (H) 04/08/2019   Lab Results  Component Value Date   CREATININE 0.95 04/08/2019   BUN 14 04/08/2019   NA 138 04/08/2019   K 4.8 04/08/2019   CL 99 04/08/2019   CO2 30 04/08/2019   Lab Results  Component Value Date   CHOL 138 04/08/2019   CHOL 149 04/06/2018   Lab Results  Component Value Date   HDL 38.60 (L) 04/08/2019   HDL 42.20 04/06/2018   Lab Results  Component Value Date   LDLCALC 78 04/08/2019   LDLCALC 80 04/06/2018   LDLCALC 68 04/21/2017   Lab Results  Component Value  Date   TRIG 106.0 04/08/2019   TRIG 137.0 04/06/2018   Lab Results  Component Value Date   CHOLHDL 4 04/08/2019   CHOLHDL 4 04/06/2018   No results found for: LDLDIRECT The 10-year ASCVD risk score Mikey Bussing DC Jr., et al., 2013) is: 42.5%   Values used to calculate the score:     Age: 89 years     Sex: Male     Is Non-Hispanic African American: No     Diabetic: Yes     Tobacco smoker: Yes     Systolic Blood Pressure: 875 mmHg     Is BP treated: Yes      HDL Cholesterol: 38.6 mg/dL     Total Cholesterol: 138 mg/dL  BP Readings from Last 3 Encounters:  04/11/20 (!) 146/82  03/21/20 (!) 144/86  12/15/19 126/80   Wt Readings from Last 3 Encounters:  04/11/20 237 lb 3.2 oz (107.6 kg)  03/21/20 237 lb 1.9 oz (107.6 kg)  12/15/19 240 lb (108.9 kg)    Health Maintenance  Topic Date Due  . FOOT EXAM  01/12/2020  . HEMOGLOBIN A1C  10/09/2020  . OPHTHALMOLOGY EXAM  03/07/2021  . Fecal DNA (Cologuard)  06/25/2021  . TETANUS/TDAP  01/09/2025  . INFLUENZA VACCINE  Completed  . COVID-19 Vaccine  Completed  . Hepatitis C Screening  Completed  . PNA vac Low Risk Adult  Completed     Patient Active Problem List   Diagnosis Date Noted  . Combined hyperlipidemia associated with type 2 diabetes mellitus ([place]) 07/09/2019  . Coronary artery disease involving native coronary artery of native heart without angina pectoris   . Encounter for screening for lung cancer 11/09/2018  . Smoking greater than 30 pack years - quit 03/2018 10/03/2017  . Type 2 diabetes mellitus with peripheral neuropathy ([place]) 07/12/2014  . COPD (chronic obstructive pulmonary disease) ([place]) 01/07/2014  . Chronic pain 10/07/2013  . OSA on CPAP 10/07/2013  . Essential hypertension 08/31/2010  . BPH without obstruction/lower urinary tract symptoms 10/03/2017  . Gastroesophageal reflux disease 01/10/2015  . Primary osteoarthritis involving multiple joints 01/10/2015  . 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  . Rosacea 10/03/2017  . 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 Thoracic and lumbosacral neuritis   . Obesity (BMI 30-39.9)   . Hypertension   . Hyperlipidemia   . History of lumbar surgery   . Diabetes mellitus without complication ([place])   . DDD (degenerative disc disease), lumbar   . Chronic low back pain with right-sided sciatica   . Chronic left shoulder pain   . CAD (coronary artery disease)   . BMI 30.0-30.9,adult   . Controlled type 2 diabetes mellitus without  complication, without long-term current use of insulin ([place]) 07/12/2014   Health Maintenance  Topic Date Due  . FOOT EXAM  01/12/2020  . HEMOGLOBIN A1C  10/09/2020  . OPHTHALMOLOGY EXAM  03/07/2021  . Fecal DNA (Cologuard)  06/25/2021  . TETANUS/TDAP  01/09/2025  . INFLUENZA VACCINE  Completed  . COVID-19 Vaccine  Completed  . Hepatitis C Screening  Completed  . PNA vac Low Risk Adult  Completed   Immunization History  Administered Date(s) Administered  . Fluad Quad(high Dose 65+) 02/02/2019, 02/29/2020  . Hepatitis B, ped/adol 06/04/2003  . Influenza Split 03/03/2010  . Influenza, High Dose Seasonal PF 03/21/2017, 03/03/2018  . Influenza, Seasonal, Injecte, Preservative Fre 03/23/2014, 04/12/2015  . Influenza,inj,Quad PF,6+ Mos 04/01/2016  . PFIZER SARS-COV-2 Vaccination 07/30/2019, 08/24/2019, 03/18/2020  . Pneumococcal Conjugate-13 06/06/2018  .  Pneumococcal Polysaccharide-23 06/04/2003, 07/09/2019  . Td 06/04/2003  . Tdap 01/10/2015  . Zoster 09/18/2015   We updated and reviewed the patient's past history in detail and it is documented below. Allergies: Patient is allergic to rubus fruticosus. Past Medical History  has a past medical history of BMI 30.0-30.9,adult, BPH without obstruction/lower urinary tract symptoms (10/03/2017), CAD (coronary artery disease), Chronic left shoulder pain (c), Chronic low back pain with right-sided sciatica, Chronic pain (10/07/2013), Combined hyperlipidemia associated with type 2 diabetes mellitus ([place]) (07/09/2019), Controlled type 2 diabetes mellitus without complication, without long-term current use of insulin ([place]) (07/12/2014), COPD (chronic obstructive pulmonary disease) ([place]) (01/07/2014), Coronary artery disease involving native coronary artery of native heart without angina pectoris, DDD (degenerative disc disease), lumbar, Diabetes mellitus without complication ([place]), Eczema of external ear (06/30/2012), Encounter for screening for lung cancer  (11/09/2018), Essential hypertension (08/31/2010), Gastroesophageal reflux disease (01/10/2015), History of lumbar surgery, Hyperlipidemia, Hypertension, Hypogonadism male (04/23/2010), Obesity (BMI 30-39.9), Obesity (BMI 30.0-34.9) (10/03/2017), OSA on CPAP (10/07/2013), Primary osteoarthritis involving multiple joints (01/10/2015), Rosacea, Smoking greater than 30 pack years - quit 03/2018 (10/03/2017), Thoracic and lumbosacral neuritis, and Type 2 diabetes mellitus with peripheral neuropathy ([place]) (07/12/2014). Past Surgical History Patient  has a past surgical history that includes Shoulder arthroscopy; Spine surgery; Arthroscopic repair ACL; LEFT HEART CATH AND CORONARY ANGIOGRAPHY (N/A, 01/01/2019); INTRAVASCULAR PRESSURE WIRE/FFR STUDY (N/A, 01/01/2019); Cardiac catheterization; and Prostate Cryoablation. Social History Patient  reports that he quit smoking about 2 years ago. His smoking use included cigarettes. He has a 15.00 pack-year smoking history. He has never used smokeless tobacco. He reports previous alcohol use. He reports previous drug use. Family History family history includes Heart disease in his father and maternal uncle; Lung cancer in his father and sister; Pulmonary embolism in his mother. Review of Systems: Constitutional: negative for fever or malaise Ophthalmic: negative for photophobia, double vision or loss of vision Cardiovascular: negative for chest pain, dyspnea on exertion, or new LE swelling Respiratory: negative for SOB or persistent cough Gastrointestinal: negative for abdominal pain, change in bowel habits or melena Genitourinary: negative for dysuria or gross hematuria Musculoskeletal: negative for new gait disturbance or muscular weakness Integumentary: negative for new or persistent rashes Neurological: negative for TIA or stroke symptoms Psychiatric: negative for SI or delusions Allergic/Immunologic: negative for hives  Patient Care Team    Relationship Specialty  Notifications Start End  [name], MD PCP - General Family Medicine  10/03/17   [facility]    10/08/18   Leonie Man, MD Consulting Physician Cardiology  01/12/19   Lucas Mallow, MD Consulting Physician Urology  10/06/19   Laroy Apple, MD Referring Physician Physical Medicine and Rehabilitation  10/06/19   Richardo Priest, MD Consulting Physician Cardiology  10/06/19   Marshell Garfinkel, MD Consulting Physician Pulmonary Disease  10/06/19    Objective  Vitals: BP (!) 146/82   Pulse 67   Temp 98.6 F (37 C) (Temporal)   Wt 237 lb 3.2 oz (107.6 kg)   SpO2 96%   BMI 30.45 kg/m  General:  Well developed, well nourished, no acute distress  Psych:  Alert and orientedx3,normal mood and affect HEENT:  Normocephalic, atraumatic, non-icteric sclera, PERRL, oropharynx is clear without mass or exudate, supple neck without adenopathy, mass or thyromegaly Cardiovascular:  Normal S1, S2, RRR without gallop, rub or murmur, nondisplaced PMI, +2 distal pulses in bilateral upper and lower extremities. Respiratory:  Good breath sounds bilaterally, CTAB with normal  respiratory effort. Gastrointestinal: normal bowel sounds, soft, non-tender, no noted masses. No HSM, well-healed local hernia scar MSK: no deformities, contusions. Joints are without erythema or swelling. Spine and CVA region are nontender Skin:  Warm, no rashes or suspicious lesions noted Neurologic:    Mental status is normal. CN 2-11 are normal. Gross motor and sensory exams are normal. Stable gait. No tremor GU: No inguinal hernias or adenopathy are appreciated bilaterally   Assessment  1. Type 2 diabetes mellitus with peripheral neuropathy (HCC)   2. Combined hyperlipidemia associated with type 2 diabetes mellitus ([place])   3. Coronary artery disease involving native coronary artery of native heart without angina pectoris   4. Chronic pain syndrome   5. OSA on CPAP   6. Essential hypertension   7. BPH  without obstruction/lower urinary tract symptoms   8. Chronic obstructive pulmonary disease, unspecified COPD type [facility])      Plan  Male Wellness Visit:  Age appropriate Health Maintenance and Prevention measures were discussed with patient. Included topics are cancer screening recommendations, ways to keep healthy (see AVS) including dietary and exercise recommendations, regular eye and dental care, use of seat belts, and avoidance of moderate alcohol use and tobacco use.   BMI: discussed patient's BMI and encouraged positive lifestyle modifications to help get to or maintain a target BMI.  HM needs and immunizations were addressed and ordered. See below for orders. See HM and immunization section for updates.  Routine labs and screening tests ordered including cmp, cbc and lipids where appropriate.  Discussed recommendations regarding Vit D and calcium supplementation (see AVS)  Chronic disease f/u and/or acute problem visit: (deemed necessary to be done in addition to the wellness visit):  Diabetes remains well controlled.  Continue current medications.  Hypertension: Borderline control.  Restart HCTZ.  Change to Hyzaar 100/25.  Patient will monitor at home.  Check lab work today.  Recheck lipids and LFTs on statin  Peripheral neuropathy due to diabetes: Titrate up gabapentin dose.  Normal foot exam today.  COPD and OSA are stable.  Seeing pulmonology.  Reviewed those notes  Cardiology and CAD: Very stable.  He has annual follow-up.  No chest pain.  Continue aspirin therapy.  Chronic back pain on chronic narcotics.  Use a little less than before.  Intermittent NSAIDs are helpful.  Continue with orthopedics.  Follow up: 3 months to recheck blood pressure new medications and diabetes follow-up  Commons side effects, risks, benefits, and alternatives for medications and treatment plan prescribed today were discussed, and the patient expressed understanding of the given  instructions. Patient is instructed to call or message via MyChart if he/she has any questions or concerns regarding our treatment plan. No barriers to understanding were identified. We discussed Red Flag symptoms and signs in detail. Patient expressed understanding regarding what to do in case of urgent or emergency type symptoms.   Medication list was reconciled, printed and provided to the patient in AVS. Patient instructions and summary information was reviewed with the patient as documented in the AVS. This note was prepared with assistance of Dragon voice recognition software. Occasional wrong-word or sound-a-like substitutions may have occurred due to the inherent limitations of voice recognition software  This visit occurred during the SARS-CoV-2 public health emergency.  Safety protocols were in place, including screening questions prior to the visit, additional usage of staff PPE, and extensive cleaning of exam room while observing appropriate contact time as indicated for disinfecting solutions.   Orders Placed This  Encounter  Procedures  . CBC with Differential/Platelet  . COMPLETE METABOLIC PANEL WITH GFR  . Lipid panel  . TSH  . POCT HgB A1C   No orders of the defined types were placed in this encounter.

## 2020-04-11 NOTE — Patient Instructions (Signed)
Please return in 3 months for Blood pressure and diabetes recheck.   Change to the combination blood pill: losartan-hctz once daily.  Increase gabapentin to 300mg  twice a day.  I've refilled other medications as well.   I will release your lab results to you on your MyChart account with further instructions. Please reply with any questions.    If you have any questions or concerns, please don't hesitate to send me a message via MyChart or call the office at [phone]. Thank you for visiting with [place] today! It's our pleasure caring for you.

## 2020-04-12 LAB — LIPID PANEL
Cholesterol: 114 mg/dL (ref <200)
HDL: 36 mg/dL — ABNORMAL LOW (ref >=40)
LDL Cholesterol (Calc): 59 mg/dL (calc)
Non-HDL Cholesterol (Calc): 78 mg/dL (calc) (ref <130)
Total CHOL/HDL Ratio: 3.2 (calc) (ref <5.0)
Triglycerides: 103 mg/dL (ref <150)

## 2020-04-12 LAB — CBC WITH DIFFERENTIAL/PLATELET
Absolute Monocytes: 532 cells/uL (ref 200–950)
Basophils Absolute: 63 cells/uL (ref 0–200)
Basophils Relative: 0.9 %
Eosinophils Absolute: 252 cells/uL (ref 15–500)
Eosinophils Relative: 3.6 %
HCT: 49.9 % (ref 38.5–50.0)
Hemoglobin: 17.1 g/dL (ref 13.2–17.1)
Lymphs Abs: 1806 cells/uL (ref 850–3900)
MCH: 31.8 pg (ref 27.0–33.0)
MCHC: 34.3 g/dL (ref 32.0–36.0)
MCV: 92.9 fL (ref 80.0–100.0)
MPV: 9.4 fL (ref 7.5–12.5)
Monocytes Relative: 7.6 %
Neutro Abs: 4347 cells/uL (ref 1500–7800)
Neutrophils Relative %: 62.1 %
Platelets: 257 10*3/uL (ref 140–400)
RBC: 5.37 10*6/uL (ref 4.20–5.80)
RDW: 13.9 % (ref 11.0–15.0)
Total Lymphocyte: 25.8 %
WBC: 7 10*3/uL (ref 3.8–10.8)

## 2020-04-12 LAB — COMPLETE METABOLIC PANEL WITH GFR
AG Ratio: 2 (calc) (ref 1.0–2.5)
ALT: 21 U/L (ref 9–46)
AST: 16 U/L (ref 10–35)
Albumin: 4.8 g/dL (ref 3.6–5.1)
Alkaline phosphatase (APISO): 73 U/L (ref 35–144)
BUN: 14 mg/dL (ref 7–25)
CO2: 31 mmol/L (ref 20–32)
Calcium: 9.6 mg/dL (ref 8.6–10.3)
Chloride: 98 mmol/L (ref 98–110)
Creat: 1.02 mg/dL (ref 0.70–1.25)
GFR, Est African American: 88 mL/min/{1.73_m2} (ref >=60)
GFR, Est Non African American: 76 mL/min/{1.73_m2} (ref >=60)
Globulin: 2.4 g/dL (calc) (ref 1.9–3.7)
Glucose, Bld: 128 mg/dL — ABNORMAL HIGH (ref 65–99)
Potassium: 4.9 mmol/L (ref 3.5–5.3)
Sodium: 136 mmol/L (ref 135–146)
Total Bilirubin: 0.7 mg/dL (ref 0.2–1.2)
Total Protein: 7.2 g/dL (ref 6.1–8.1)

## 2020-04-12 LAB — TSH: TSH: 1.68 mIU/L (ref 0.40–4.50)

## 2020-04-13 DIAGNOSIS — E349 Endocrine disorder, unspecified: Secondary | ICD-10-CM | POA: Diagnosis not present

## 2020-04-13 DIAGNOSIS — R948 Abnormal results of function studies of other organs and systems: Secondary | ICD-10-CM | POA: Diagnosis not present

## 2020-04-13 DIAGNOSIS — E291 Testicular hypofunction: Secondary | ICD-10-CM | POA: Diagnosis not present

## 2020-04-20 DIAGNOSIS — E230 Hypopituitarism: Secondary | ICD-10-CM | POA: Diagnosis not present

## 2020-04-20 DIAGNOSIS — N401 Enlarged prostate with lower urinary tract symptoms: Secondary | ICD-10-CM | POA: Diagnosis not present

## 2020-04-20 DIAGNOSIS — R3914 Feeling of incomplete bladder emptying: Secondary | ICD-10-CM | POA: Diagnosis not present

## 2020-04-25 DIAGNOSIS — M545 Low back pain, unspecified: Secondary | ICD-10-CM | POA: Diagnosis not present

## 2020-04-25 DIAGNOSIS — M47816 Spondylosis without myelopathy or radiculopathy, lumbar region: Secondary | ICD-10-CM | POA: Diagnosis not present

## 2020-05-03 DIAGNOSIS — M545 Low back pain, unspecified: Secondary | ICD-10-CM | POA: Diagnosis not present

## 2020-05-03 DIAGNOSIS — M47816 Spondylosis without myelopathy or radiculopathy, lumbar region: Secondary | ICD-10-CM | POA: Diagnosis not present

## 2020-05-06 ENCOUNTER — Other Ambulatory Visit: Payer: Self-pay | Admitting: Family Medicine

## 2020-05-08 ENCOUNTER — Telehealth: Payer: Self-pay

## 2020-05-08 ENCOUNTER — Other Ambulatory Visit: Payer: Self-pay

## 2020-05-08 MED ORDER — GABAPENTIN 300 MG PO CAPS
300.0000 mg | ORAL_CAPSULE | Freq: Two times a day (BID) | ORAL | 3 refills | Status: DC
Start: 2020-05-08 — End: 2021-01-11

## 2020-05-08 MED ORDER — GABAPENTIN 300 MG PO CAPS
300.0000 mg | ORAL_CAPSULE | Freq: Two times a day (BID) | ORAL | 3 refills | Status: DC
Start: 2020-05-08 — End: 2020-05-08

## 2020-05-08 NOTE — Telephone Encounter (Signed)
Script sent to pharmacy.

## 2020-05-08 NOTE — Telephone Encounter (Signed)
..   LAST APPOINTMENT DATE: 05/06/2020   NEXT APPOINTMENT DATE:@2 /02/2021  MEDICATION:gabapentin (NEURONTIN) 300 MG capsule    PHARMACY: [facility]

## 2020-05-15 ENCOUNTER — Other Ambulatory Visit: Payer: Self-pay | Admitting: Cardiology

## 2020-05-15 ENCOUNTER — Other Ambulatory Visit: Payer: Self-pay | Admitting: Family Medicine

## 2020-05-15 NOTE — Telephone Encounter (Signed)
Rx refill sent to pharmacy. 

## 2020-05-29 DIAGNOSIS — M47816 Spondylosis without myelopathy or radiculopathy, lumbar region: Secondary | ICD-10-CM | POA: Diagnosis not present

## 2020-05-29 DIAGNOSIS — M545 Low back pain, unspecified: Secondary | ICD-10-CM | POA: Diagnosis not present

## 2020-06-14 ENCOUNTER — Other Ambulatory Visit: Payer: Self-pay | Admitting: Family Medicine

## 2020-06-14 NOTE — Telephone Encounter (Signed)
Last refill: 04/11/20 #90, 0 Last OV: 04/11/20 dx. DM f/u

## 2020-06-15 MED ORDER — ACETAMINOPHEN-CODEINE #3 300-30 MG PO TABS
ORAL_TABLET | ORAL | 0 refills | Status: DC
Start: 2020-06-15 — End: 2020-08-27

## 2020-06-26 ENCOUNTER — Encounter: Payer: Self-pay | Admitting: Family Medicine

## 2020-06-28 ENCOUNTER — Other Ambulatory Visit: Payer: Self-pay | Admitting: Family Medicine

## 2020-07-07 ENCOUNTER — Ambulatory Visit (INDEPENDENT_AMBULATORY_CARE_PROVIDER_SITE_OTHER): Payer: Medicare Other

## 2020-07-07 ENCOUNTER — Other Ambulatory Visit: Payer: Self-pay

## 2020-07-07 DIAGNOSIS — Z0000 Encounter for general adult medical examination without abnormal findings: Secondary | ICD-10-CM

## 2020-07-07 NOTE — Patient Instructions (Addendum)
[name] , Thank you for taking time to come for your Medicare Wellness Visit. I appreciate your ongoing commitment to your health goals. Please review the following plan we discussed and let me know if I can assist you in the future.   Screening recommendations/referrals: Colonoscopy: Done cologuard 06/25/18 Recommended yearly ophthalmology/optometry visit for glaucoma screening and checkup Recommended yearly dental visit for hygiene and checkup  Vaccinations: Influenza vaccine: Done 02/29/20 Pneumococcal vaccine: Up to date Tdap vaccine: Up to date Shingles vaccine: Shingrix discussed. Please contact your pharmacy for coverage information.    Covid-19: Completed 2/26/, 3/23, & 03/18/20  Advanced directives: Please bring a copy of your health care power of attorney and living will to the office at your convenience.  Conditions/risks identified: Lose weight   Next appointment: Follow up in one year for your annual wellness visit.   Preventive Care 68 Years and Older, Male Preventive care refers to lifestyle choices and visits with your health care provider that can promote health and wellness. What does preventive care include?  A yearly physical exam. This is also called an annual well check.  Dental exams once or twice a year.  Routine eye exams. Ask your health care provider how often you should have your eyes checked.  Personal lifestyle choices, including:  Daily care of your teeth and gums.  Regular physical activity.  Eating a healthy diet.  Avoiding tobacco and drug use.  Limiting alcohol use.  Practicing safe sex.  Taking low doses of aspirin every day.  Taking vitamin and mineral supplements as recommended by your health care provider. What happens during an annual well check? The services and screenings done by your health care provider during your annual well check will depend on your age, overall health, lifestyle risk factors, and family history of  disease. Counseling  Your health care provider may ask you questions about your:  Alcohol use.  Tobacco use.  Drug use.  Emotional well-being.  Home and relationship well-being.  Sexual activity.  Eating habits.  History of falls.  Memory and ability to understand (cognition).  Work and work Statistician. Screening  You may have the following tests or measurements:  Height, weight, and BMI.  Blood pressure.  Lipid and cholesterol levels. These may be checked every 5 years, or more frequently if you are over 6 years old.  Skin check.  Lung cancer screening. You may have this screening every year starting at age 15 if you have a 30-pack-year history of smoking and currently smoke or have quit within the past 15 years.  Fecal occult blood test (FOBT) of the stool. You may have this test every year starting at age 15.  Flexible sigmoidoscopy or colonoscopy. You may have a sigmoidoscopy every 5 years or a colonoscopy every 10 years starting at age 56.  Prostate cancer screening. Recommendations will vary depending on your family history and other risks.  Hepatitis C blood test.  Hepatitis B blood test.  Sexually transmitted disease (STD) testing.  Diabetes screening. This is done by checking your blood sugar (glucose) after you have not eaten for a while (fasting). You may have this done every 1-3 years.  Abdominal aortic aneurysm (AAA) screening. You may need this if you are a current or former smoker.  Osteoporosis. You may be screened starting at age 34 if you are at high risk. Talk with your health care provider about your test results, treatment options, and if necessary, the need for more tests. Vaccines  Your  health care provider may recommend certain vaccines, such as:  Influenza vaccine. This is recommended every year.  Tetanus, diphtheria, and acellular pertussis (Tdap, Td) vaccine. You may need a Td booster every 10 years.  Zoster vaccine. You may  need this after age 61.  Pneumococcal 13-valent conjugate (PCV13) vaccine. One dose is recommended after age 47.  Pneumococcal polysaccharide (PPSV23) vaccine. One dose is recommended after age 73. Talk to your health care provider about which screenings and vaccines you need and how often you need them. This information is not intended to replace advice given to you by your health care provider. Make sure you discuss any questions you have with your health care provider. Document Released: 06/16/2015 Document Revised: 02/07/2016 Document Reviewed: 03/21/2015 Elsevier Interactive Patient Education  2017 [place] Prevention in the Home Falls can cause injuries. They can happen to people of all ages. There are many things you can do to make your home safe and to help prevent falls. What can I do on the outside of my home?  Regularly fix the edges of walkways and driveways and fix any cracks.  Remove anything that might make you trip as you walk through a door, such as a raised step or threshold.  Trim any bushes or trees on the path to your home.  Use bright outdoor lighting.  Clear any walking paths of anything that might make someone trip, such as rocks or tools.  Regularly check to see if handrails are loose or broken. Make sure that both sides of any steps have handrails.  Any raised decks and porches should have guardrails on the edges.  Have any leaves, snow, or ice cleared regularly.  Use sand or salt on walking paths during winter.  Clean up any spills in your garage right away. This includes oil or grease spills. What can I do in the bathroom?  Use night lights.  Install grab bars by the toilet and in the tub and shower. Do not use towel bars as grab bars.  Use non-skid mats or decals in the tub or shower.  If you need to sit down in the shower, use a plastic, non-slip stool.  Keep the floor dry. Clean up any water that spills on the floor as soon as it  happens.  Remove soap buildup in the tub or shower regularly.  Attach bath mats securely with double-sided non-slip rug tape.  Do not have throw rugs and other things on the floor that can make you trip. What can I do in the bedroom?  Use night lights.  Make sure that you have a light by your bed that is easy to reach.  Do not use any sheets or blankets that are too big for your bed. They should not hang down onto the floor.  Have a firm chair that has side arms. You can use this for support while you get dressed.  Do not have throw rugs and other things on the floor that can make you trip. What can I do in the kitchen?  Clean up any spills right away.  Avoid walking on wet floors.  Keep items that you use a lot in easy-to-reach places.  If you need to reach something above you, use a strong step stool that has a grab bar.  Keep electrical cords out of the way.  Do not use floor polish or wax that makes floors slippery. If you must use wax, use non-skid floor wax.  Do not  have throw rugs and other things on the floor that can make you trip. What can I do with my stairs?  Do not leave any items on the stairs.  Make sure that there are handrails on both sides of the stairs and use them. Fix handrails that are broken or loose. Make sure that handrails are as long as the stairways.  Check any carpeting to make sure that it is firmly attached to the stairs. Fix any carpet that is loose or worn.  Avoid having throw rugs at the top or bottom of the stairs. If you do have throw rugs, attach them to the floor with carpet tape.  Make sure that you have a light switch at the top of the stairs and the bottom of the stairs. If you do not have them, ask someone to add them for you. What else can I do to help prevent falls?  Wear shoes that:  Do not have high heels.  Have rubber bottoms.  Are comfortable and fit you well.  Are closed at the toe. Do not wear sandals.  If you  use a stepladder:  Make sure that it is fully opened. Do not climb a closed stepladder.  Make sure that both sides of the stepladder are locked into place.  Ask someone to hold it for you, if possible.  Clearly mark and make sure that you can see:  Any grab bars or handrails.  First and last steps.  Where the edge of each step is.  Use tools that help you move around (mobility aids) if they are needed. These include:  Canes.  Walkers.  Scooters.  Crutches.  Turn on the lights when you go into a dark area. Replace any light bulbs as soon as they burn out.  Set up your furniture so you have a clear path. Avoid moving your furniture around.  If any of your floors are uneven, fix them.  If there are any pets around you, be aware of where they are.  Review your medicines with your doctor. Some medicines can make you feel dizzy. This can increase your chance of falling. Ask your doctor what other things that you can do to help prevent falls. This information is not intended to replace advice given to you by your health care provider. Make sure you discuss any questions you have with your health care provider. Document Released: 03/16/2009 Document Revised: 10/26/2015 Document Reviewed: 06/24/2014 Elsevier Interactive Patient Education  2017 [employer].

## 2020-07-07 NOTE — Progress Notes (Signed)
Virtual Visit via Telephone Note  I connected with  Jason Snyder on 07/07/20 at  9:30 AM EST by telephone and verified that I am speaking with the correct person using two identifiers.  Medicare Annual Wellness visit completed telephonically due to Covid-19 pandemic.   Persons participating in this call: This Health Coach and this patient.   Location: Patient: Home Provider: Office   I discussed the limitations, risks, security and privacy concerns of performing an evaluation and management service by telephone and the availability of in person appointments. The patient expressed understanding and agreed to proceed.  Unable to perform video visit due to video visit attempted and failed and/or patient does not have video capability.   Some vital signs may be absent or patient reported.   Willette Brace, LPN    Subjective:   Jason Snyder is a 70 y.o. male who presents for Medicare Annual/Subsequent preventive examination.  Review of Systems     Cardiac Risk Factors include: advanced age (>31men, >63 women);diabetes mellitus;hypertension;dyslipidemia;male gender;obesity (BMI >30kg/m2)     Objective:    Today's Vitals   07/07/20 0925  PainSc: 3    There is no height or weight on file to calculate BMI.  Advanced Directives 07/07/2020 04/08/2019 01/01/2019  Does Patient Have a Medical Advance Directive? Yes No No  Type of Advance Directive [place] in Chart? No - copy requested - -  Would patient like information on creating a medical advance directive? - Yes (MAU/Ambulatory/Procedural Areas - Information given) No - Patient declined    Current Medications (verified) Outpatient Encounter Medications as of 07/07/2020  Medication Sig  . acetaminophen-codeine (TYLENOL #3) 300-30 MG tablet TAKE ONE TABLET BY MOUTH EVERY SIX HOURS AS NEEDED FOR MODERATE PAIN  . acetic acid 2 % otic solution Place 4 drops into both ears 2  (two) times daily as needed.  Marland Kitchen albuterol (VENTOLIN HFA) 108 (90 Base) MCG/ACT inhaler inhale 2 puffs by mouh into the lungs every four hours as needed for wheezing  . amLODipine (NORVASC) 10 MG tablet Take 1 tablet (10 mg total) by mouth daily.  Marland Kitchen aspirin 81 MG EC tablet Take 81 mg by mouth daily.   Marland Kitchen atorvastatin (LIPITOR) 20 MG tablet Take 1 tablet (20 mg total) by mouth at bedtime.  . ciprofloxacin-dexamethasone (CIPRODEX) OTIC suspension Place 4 drops into both ears 2 (two) times daily.  . cyclobenzaprine (FLEXERIL) 10 MG tablet TAKE ONE TABLET BY MOUTH THREE TIMES DAILY AS NEEDED FOR MUSCLE SPASMS  . dapagliflozin propanediol (FARXIGA) 10 MG TABS tablet Take 10 mg by mouth daily.  . fluticasone (FLONASE) 50 MCG/ACT nasal spray Place 1 spray into the nose daily as needed for allergies.   Marland Kitchen gabapentin (NEURONTIN) 300 MG capsule Take 1 capsule (300 mg total) by mouth 2 (two) times daily.  . hydrochlorothiazide (HYDRODIURIL) 25 MG tablet Take 25 mg by mouth daily.  Marland Kitchen Ketotifen Fumarate (ALLERGY EYE DROPS OP) Place 1 drop into both eyes daily.  . Loratadine 10 MG CAPS Take 10 mg by mouth daily as needed (allergies).   . losartan-hydrochlorothiazide (HYZAAR) 100-25 MG tablet Take 1 tablet by mouth daily.  . meloxicam (MOBIC) 15 MG tablet Take 15 mg by mouth daily.  . metFORMIN (GLUCOPHAGE) 500 MG tablet Take 1 tablet (500 mg total) by mouth 2 (two) times daily with a meal.  . metoprolol tartrate (LOPRESSOR) 50 MG tablet Take 1 tablet (50 mg total) by  mouth 2 (two) times daily.  . Multiple Vitamin (MULTIVITAMIN) capsule Take 1 capsule by mouth daily.   . nitroGLYCERIN (NITROSTAT) 0.4 MG SL tablet disslove one tablet under the tongue every five minutes as needed for chest pain  . tamsulosin (FLOMAX) 0.4 MG CAPS capsule TAKE TWO CAPSULES BY MOUTH DAILY  . testosterone cypionate (DEPOTESTOSTERONE CYPIONATE) 200 MG/ML injection INJECT 1ML INTO THE MUSCLE EVERY 14 DAYS  . Tiotropium Bromide-Olodaterol  (STIOLTO RESPIMAT) 2.5-2.5 MCG/ACT AERS Inhale 2 puffs into the lungs daily.  Marland Kitchen triamcinolone cream (KENALOG) 0.1 % Apply 1 application topically 2 (two) times daily. For 2 weeks, then as needed  . diclofenac sodium (VOLTAREN) 1 % GEL Apply 2 g topically 4 (four) times daily as needed (pain).  (Patient not taking: Reported on 07/07/2020)  . [DISCONTINUED] ibuprofen (ADVIL) 200 MG tablet Take 200 mg by mouth 2 (two) times daily as needed for moderate pain.  . [DISCONTINUED] naproxen sodium (ALEVE) 220 MG tablet Take 220 mg by mouth daily as needed (pain).   No facility-administered encounter medications on file as of 07/07/2020.    Allergies (verified) Rubus fruticosus   History: Past Medical History:  Diagnosis Date  . BMI 30.0-30.9,adult   . BPH without obstruction/lower urinary tract symptoms 10/03/2017  . CAD (coronary artery disease)    cath 2020, single vessel disease, medical mgt  . Chronic left shoulder pain c  . Chronic low back pain with right-sided sciatica   . Chronic pain 10/07/2013  . Combined hyperlipidemia associated with type 2 diabetes mellitus ([place]) 07/09/2019  . Controlled type 2 diabetes mellitus without complication, without long-term current use of insulin ([place]) 07/12/2014  . COPD (chronic obstructive pulmonary disease) ([place]) 01/07/2014   PFTs 07/2018 minimal airway obstruction, overinflation and no response to bronchodilators.   . Coronary artery disease involving native coronary artery of native heart without angina pectoris    Cardiac Cath 01/2019: Severe single-vessel disease with additional moderate two-vessel disease: 100% CTO of RCA, moderate disease in proximal LCx, distal LCx and very small caliber 1st Diag DFR-FFR negative lesion in proximal LCx Normal LV pressures with previously document, ed normal EF.  . DDD (degenerative disc disease), lumbar   . Diabetes mellitus without complication ([place])   . 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 Encounter for screening for lung  cancer 11/09/2018   Chest CT: neg lung cancer: + COPD changes and cardiovascular calcifications  . Essential hypertension 08/31/2010   Elevated ASCVD risk - started lipitor 04/2013  . Gastroesophageal reflux disease 01/10/2015  . History of lumbar surgery   . Hyperlipidemia   . Hypertension   . 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 Obesity (BMI 30-39.9)   . Obesity (BMI 30.0-34.9) 10/03/2017  . OSA on CPAP 10/07/2013  . Primary osteoarthritis involving multiple joints 01/10/2015  . Rosacea   . Smoking greater than 30 pack years - quit 03/2018 10/03/2017  . Thoracic and lumbosacral neuritis   . Type 2 diabetes mellitus with peripheral neuropathy ([place]) 07/12/2014   Past Surgical History:  Procedure Laterality Date  . ARTHROSCOPIC REPAIR ACL    . CARDIAC CATHETERIZATION    . INTRAVASCULAR PRESSURE WIRE/FFR STUDY N/A 01/01/2019   Procedure: INTRAVASCULAR PRESSURE WIRE/FFR STUDY;  Surgeon: Leonie Man, MD;  Location: [facility];  Service: Cardiovascular;  Laterality: N/A;  . LEFT HEART CATH AND CORONARY ANGIOGRAPHY N/A 01/01/2019   Procedure: LEFT HEART CATH AND CORONARY ANGIOGRAPHY;  Surgeon: Ellyn Hack,  Leonie Green, MD;  Location: [facility];  Service: Cardiovascular;  Laterality: N/A;  . PROSTATE CRYOABLATION     03/2019  . SHOULDER ARTHROSCOPY    . SPINE SURGERY     Family History  Problem Relation Age of Onset  . Pulmonary embolism Mother   . Heart disease Father   . Lung cancer Father   . Lung cancer Sister   . Heart disease Maternal Uncle    Social History   Socioeconomic History  . Marital status: Married    Spouse name: Not on file  . Number of children: Not on file  . Years of education: Not on file  . Highest education level: Not on file  Occupational History  . Occupation: retired  Tobacco Use  . Smoking status: Former Smoker    Packs/day: 0.50    Years: 30.00    Pack years: 15.00    Types:  Cigarettes    Quit date: 03/05/2018    Years since quitting: 2.3  . Smokeless tobacco: Never Used  . Tobacco comment: no cigarettes since 03/05/18  Vaping Use  . Vaping Use: Never used  Substance and Sexual Activity  . Alcohol use: Not Currently  . Drug use: Not Currently  . Sexual activity: Not Currently  Other Topics Concern  . Not on file  Social History Narrative  . Not on file   Social Determinants of Health   Financial Resource Strain: Low Risk   . Difficulty of Paying Living Expenses: Not hard at all  Food Insecurity: No Food Insecurity  . Worried About Charity fundraiser in the Last Year: Never true  . Ran Out of Food in the Last Year: Never true  Transportation Needs: No Transportation Needs  . Lack of Transportation (Medical): No  . Lack of Transportation (Non-Medical): No  Physical Activity: Insufficiently Active  . Days of Exercise per Week: 7 days  . Minutes of Exercise per Session: 20 min  Stress: No Stress Concern Present  . Feeling of Stress : Not at all  Social Connections: Moderately Isolated  . Frequency of Communication with Friends and Family: More than three times a week  . Frequency of Social Gatherings with Friends and Family: Once a week  . Attends Religious Services: Never  . Active Member of Clubs or Organizations: No  . Attends Archivist Meetings: Never  . Marital Status: Married    Tobacco Counseling Counseling given: Not Answered Comment: no cigarettes since 03/05/18   Clinical Intake:  Pre-visit preparation completed: Yes  Pain : 0-10 Pain Score: 3  Pain Type: Chronic pain Pain Location: Back Pain Descriptors / Indicators: Sharp Pain Onset: More than a month ago Pain Frequency: Intermittent     BMI - recorded: 30 Nutritional Status: BMI > 30  Obese Nutritional Risks: None Diabetes: Yes CBG done?: No Did pt. bring in CBG monitor from home?: No  How often do you need to have someone help you when you read  instructions, pamphlets, or other written materials from your doctor or pharmacy?: 1 - Never  Diabetic?Nutrition Risk Assessment:  Has the patient had any N/V/D within the last 2 months?  No  Does the patient have any non-healing wounds?  No  Has the patient had any unintentional weight loss or weight gain?  No   Diabetes:  Is the patient diabetic?  Yes  If diabetic, was a CBG obtained today?  No  Did the patient bring in their glucometer from home?  No  How often do you monitor your CBG's? N/A.   Financial Strains and Diabetes Management:  Are you having any financial strains with the device, your supplies or your medication? No .  Does the patient want to be seen by Chronic Care Management for management of their diabetes?  No  Would the patient like to be referred to a Nutritionist or for Diabetic Management?  No   Diabetic Exams:  Diabetic Eye Exam: Completed 03/07/20 Diabetic Foot Exam: Overdue, Pt has been advised about the importance in completing this exam. Pt is scheduled for diabetic foot exam on 07/11/20 next appt.     Interpreter Needed?: No  Information entered by :: Charlott Rakes, LPN   Activities of Daily Living In your present state of health, do you have any difficulty performing the following activities: 07/07/2020 10/06/2019  Hearing? N N  Vision? N N  Difficulty concentrating or making decisions? N N  Walking or climbing stairs? N N  Dressing or bathing? N N  Doing errands, shopping? N N  Preparing Food and eating ? N -  Using the Toilet? N -  In the past six months, have you accidently leaked urine? N -  Do you have problems with loss of bowel control? N -  Managing your Medications? N -  Managing your Finances? N -  Housekeeping or managing your Housekeeping? N -  Some recent data might be hidden    Patient Care Team: [name], MD as PCP - General (Family Medicine) [facility], [facility] Leonie Man, MD as Consulting  Physician (Cardiology) Lucas Mallow, MD as Consulting Physician (Urology) Laroy Apple, MD as Referring Physician (Physical Medicine and Rehabilitation) Richardo Priest, MD as Consulting Physician (Cardiology) Marshell Garfinkel, MD as Consulting Physician (Pulmonary Disease)  Indicate any recent Medical Services you may have received from other than [facility] providers in the past year (date may be approximate).     Assessment:   This is a routine wellness examination for Jason Snyder.  Hearing/Vision screen  Hearing Screening   125Hz  250Hz  500Hz  1000Hz  2000Hz  3000Hz  4000Hz  6000Hz  8000Hz   Right ear:           Left ear:           Comments: Pt denies any hearing issues   Vision Screening Comments: Pt follows Dr Marzetta Board for annual eye exams  Dietary issues and exercise activities discussed: Current Exercise Habits: Home exercise routine, Type of exercise: walking, Time (Minutes): 20, Frequency (Times/Week): 7, Weekly Exercise (Minutes/Week): 140  Goals    . Patient Stated     Lose weight       Depression Screen PHQ 2/9 Scores 07/07/2020 04/08/2019 10/08/2018 10/03/2017  PHQ - 2 Score 0 0 0 0  PHQ- 9 Score - - - 0    Fall Risk Fall Risk  07/07/2020 10/06/2019 04/08/2019 10/08/2018  Falls in the past year? 0 0 0 0  Number falls in past yr: 0 0 0 0  Injury with Fall? 0 0 0 0  Risk for fall due to : Impaired vision - - -  Follow up Falls prevention discussed - - Falls evaluation completed    FALL RISK PREVENTION PERTAINING TO THE HOME:  Any stairs in or around the home? Yes  If so, are there any without handrails? No  Home free of loose throw rugs in walkways, pet beds, electrical cords, etc? Yes  Adequate lighting in your home to reduce risk of falls? Yes  ASSISTIVE DEVICES UTILIZED TO PREVENT FALLS:  Life alert? No  Use of a cane, walker or w/c? No  Grab bars in the bathroom? Yes  Shower chair or bench in shower? No  Elevated toilet seat or a handicapped toilet? No   TIMED UP AND  GO:  Was the test performed? No      Cognitive Function:     6CIT Screen 07/07/2020  What Year? 0 points  What month? 0 points  Count back from 20 0 points  Months in reverse 0 points  Repeat phrase 0 points    Immunizations Immunization History  Administered Date(s) Administered  . Fluad Quad(high Dose 65+) 02/02/2019, 02/29/2020  . Hepatitis B, ped/adol 06/04/2003  . Influenza Split 03/03/2010  . Influenza, High Dose Seasonal PF 03/21/2017, 03/03/2018  . Influenza, Seasonal, Injecte, Preservative Fre 03/23/2014, 04/12/2015  . Influenza,inj,Quad PF,6+ Mos 04/01/2016  . PFIZER(Purple Top)SARS-COV-2 Vaccination 07/30/2019, 08/24/2019, 03/18/2020  . Pneumococcal Conjugate-13 06/06/2018  . Pneumococcal Polysaccharide-23 06/04/2003, 07/09/2019  . Td 06/04/2003  . Tdap 01/10/2015  . Zoster 09/18/2015    TDAP status: Up to date  Flu Vaccine status: Up to date  Done 02/29/20  Pneumococcal vaccine status: Up to date  Covid-19 vaccine status: Completed vaccines  Qualifies for Shingles Vaccine? Yes   Zostavax completed Yes   Shingrix Completed?: No.    Education has been provided regarding the importance of this vaccine. Patient has been advised to call insurance company to determine out of pocket expense if they have not yet received this vaccine. Advised may also receive vaccine at local pharmacy or Health Dept. Verbalized acceptance and understanding.  Screening Tests Health Maintenance  Topic Date Due  . FOOT EXAM  07/10/2020 (Originally 01/12/2020)  . HEMOGLOBIN A1C  10/09/2020  . OPHTHALMOLOGY EXAM  03/07/2021  . Fecal DNA (Cologuard)  06/25/2021  . TETANUS/TDAP  01/09/2025  . INFLUENZA VACCINE  Completed  . COVID-19 Vaccine  Completed  . Hepatitis C Screening  Completed  . PNA vac Low Risk Adult  Completed    Health Maintenance  There are no preventive care reminders to display for this patient.  Colorectal cancer screening: Type of screening: Cologuard.  Completed 06/25/18. Repeat every 3 years   Additional Screening:  Hepatitis C Screening: Completed 04/06/18  Vision Screening: Recommended annual ophthalmology exams for early detection of glaucoma and other disorders of the eye. Is the patient up to date with their annual eye exam?  Yes  Who is the provider or what is the name of the office in which the patient attends annual eye exams? Dr Gay Filler  Dental Screening: Recommended annual dental exams for proper oral hygiene  Community Resource Referral / Chronic Care Management: CRR required this visit?  No   CCM required this visit?  No      Plan:     I have personally reviewed and noted the following in the patient's chart:   . Medical and social history . Use of alcohol, tobacco or illicit drugs  . Current medications and supplements . Functional ability and status . Nutritional status . Physical activity . Advanced directives . List of other physicians . Hospitalizations, surgeries, and ER visits in previous 12 months . Vitals . Screenings to include cognitive, depression, and falls . Referrals and appointments  In addition, I have reviewed and discussed with patient certain preventive protocols, quality metrics, and best practice recommendations. A written personalized care plan for preventive services as well as general preventive health recommendations were provided to  patient.     Willette Brace, LPN   [id number]   Nurse Notes: None

## 2020-07-12 ENCOUNTER — Ambulatory Visit (INDEPENDENT_AMBULATORY_CARE_PROVIDER_SITE_OTHER): Payer: Medicare Other | Admitting: Family Medicine

## 2020-07-12 ENCOUNTER — Other Ambulatory Visit: Payer: Self-pay

## 2020-07-12 ENCOUNTER — Encounter: Payer: Self-pay | Admitting: Family Medicine

## 2020-07-12 VITALS — BP 130/72 | HR 73 | Temp 98.3°F | Ht 74.0 in | Wt 240.2 lb

## 2020-07-12 DIAGNOSIS — I2510 Atherosclerotic heart disease of native coronary artery without angina pectoris: Secondary | ICD-10-CM | POA: Diagnosis not present

## 2020-07-12 DIAGNOSIS — J449 Chronic obstructive pulmonary disease, unspecified: Secondary | ICD-10-CM | POA: Diagnosis not present

## 2020-07-12 DIAGNOSIS — E1142 Type 2 diabetes mellitus with diabetic polyneuropathy: Secondary | ICD-10-CM | POA: Diagnosis not present

## 2020-07-12 DIAGNOSIS — I10 Essential (primary) hypertension: Secondary | ICD-10-CM

## 2020-07-12 DIAGNOSIS — E782 Mixed hyperlipidemia: Secondary | ICD-10-CM

## 2020-07-12 DIAGNOSIS — M5441 Lumbago with sciatica, right side: Secondary | ICD-10-CM | POA: Diagnosis not present

## 2020-07-12 DIAGNOSIS — Z7185 Encounter for immunization safety counseling: Secondary | ICD-10-CM | POA: Diagnosis not present

## 2020-07-12 DIAGNOSIS — G8929 Other chronic pain: Secondary | ICD-10-CM | POA: Diagnosis not present

## 2020-07-12 DIAGNOSIS — E1169 Type 2 diabetes mellitus with other specified complication: Secondary | ICD-10-CM | POA: Diagnosis not present

## 2020-07-12 LAB — POCT GLYCOSYLATED HEMOGLOBIN (HGB A1C): Hemoglobin A1C: 6.7 % — AB (ref 4.0–5.6)

## 2020-07-12 MED ORDER — ATORVASTATIN CALCIUM 20 MG PO TABS: 20.0000 mg | ORAL_TABLET | Freq: Every day | ORAL | 3 refills | Status: AC

## 2020-07-12 MED ORDER — SHINGRIX 50 MCG/0.5ML IM SUSR: 0.5000 mL | Freq: Once | INTRAMUSCULAR | 0 refills | Status: AC

## 2020-07-12 MED ORDER — CYCLOBENZAPRINE HCL 10 MG PO TABS: 10.0000 mg | ORAL_TABLET | Freq: Three times a day (TID) | ORAL | 3 refills | Status: DC | PRN

## 2020-07-12 NOTE — Progress Notes (Signed)
Subjective  CC:  Chief Complaint  Patient presents with  . Hypertension  . Diabetes  . COPD    HPI: Jason Snyder is a 70 y.o. male who presents to the office today for follow up of diabetes and problems listed above in the chief complaint.   Diabetes follow up: His diabetic control is reported as Unchanged.  Continues to do well on Metformin and [place].  No adverse effects. He denies exertional CP or SOB or symptomatic hypoglycemia.  He has chronic peripheral neuropathy and 3 months ago we increase gabapentin to 300 mg twice daily.  He now has decreased symptoms.  No adverse effects.  Hypertension: Adjust medications last visit adding back HCTZ.  Is been monitoring at home.  Blood pressures now averaging 130s over 70s.  No chest pain or shortness of breath.  No adverse effects.  Recent lab work was stable.  Hyperlipidemia is at goal.  Continues on baby aspirin daily due to history of CAD.  Chronic back pain: See specialist.  On chronic narcotics.  Using less fortunately.  He has had some steroid injections which has helped pain.  He needs refills of Flexeril.  Medical insurance is now requesting prior authorization.  He has not used other muscle relaxers in the past.  His specialist would like him to continue it.  He uses it up to 3 times daily as needed.  COPD: Stable on inhalers.  No recent exacerbations.  No change in cough.  Health maintenance: Eligible for Shingrix vaccination.  Other immunizations are up-to-date.   Wt Readings from Last 3 Encounters:  07/12/20 240 lb 3.2 oz (109 kg)  04/11/20 237 lb 3.2 oz (107.6 kg)  03/21/20 237 lb 1.9 oz (107.6 kg)    BP Readings from Last 3 Encounters:  07/12/20 130/72  04/11/20 (!) 146/82  03/21/20 (!) 144/86    Assessment  1. Type 2 diabetes mellitus with peripheral neuropathy (HCC)   2. Combined hyperlipidemia associated with type 2 diabetes mellitus ([place])   3. Essential hypertension   4. Coronary artery disease involving native  coronary artery of native heart without angina pectoris   5. Chronic obstructive pulmonary disease, unspecified COPD type ([place]) Chronic  6. Vaccine counseling   7. Chronic low back pain with right-sided sciatica, unspecified back pain laterality      Plan   Diabetes is currently very well controlled.  Continue Farxiga 10 mg daily and Metformin 500 twice daily.  Diabetic care is up-to-date.  Peripheral neuropathy with improved control on gabapentin 300 mg twice daily.  Normal foot exam.  Hypertension: Now well controlled again.  Continue Hyzaar daily metoprolol twice daily.  Hyperlipidemia at goal on Lipitor 20 mg at bedtime.  Refill today.  Stable coronary artery disease without angina.  Discussed secondary prevention with aspirin therapy daily.  Chronic back pain on chronic narcotics: Very well controlled.  Improving actually.  Refill Flexeril.  May need to pay out of pocket.  He will follow-up with me if he needs prior authorization.  Counseling done for Shingrix vaccination.  Prescription given.  Patient to get pharmacy.  Follow up: 6 months for follow-up diabetes and hypertension. Orders Placed This Encounter  Procedures  . POCT glycosylated hemoglobin (Hb A1C)   Meds ordered this encounter  Medications  . Zoster Vaccine Adjuvanted [facility]) injection    Sig: Inject 0.5 mLs into the muscle once for 1 dose. Please give 2nd dose 2-6 months after first dose    Dispense:  2 each  Refill:  0  . atorvastatin (LIPITOR) 20 MG tablet    Sig: Take 1 tablet (20 mg total) by mouth at bedtime.    Dispense:  90 tablet    Refill:  3  . cyclobenzaprine (FLEXERIL) 10 MG tablet    Sig: Take 1 tablet (10 mg total) by mouth 3 (three) times daily as needed for muscle spasms.    Dispense:  90 tablet    Refill:  3      Immunization History  Administered Date(s) Administered  . Fluad Quad(high Dose 65+) 02/02/2019, 02/29/2020  . Hepatitis B, ped/adol 06/04/2003  . Influenza Split  03/03/2010  . Influenza, High Dose Seasonal PF 03/21/2017, 03/03/2018  . Influenza, Seasonal, Injecte, Preservative Fre 03/23/2014, 04/12/2015  . Influenza,inj,Quad PF,6+ Mos 04/01/2016  . PFIZER(Purple Top)SARS-COV-2 Vaccination 07/30/2019, 08/24/2019, 03/18/2020  . Pneumococcal Conjugate-13 06/06/2018  . Pneumococcal Polysaccharide-23 06/04/2003, 07/09/2019  . Td 06/04/2003  . Tdap 01/10/2015  . Zoster 09/18/2015    Diabetes Related Lab Review: Lab Results  Component Value Date   HGBA1C 6.7 (A) 07/12/2020   HGBA1C 6.2 (A) 04/11/2020   HGBA1C 6.7 (A) 10/06/2019    Lab Results  Component Value Date   MICROALBUR 11.1 (H) 04/08/2019   Lab Results  Component Value Date   CREATININE 1.02 04/11/2020   BUN 14 04/11/2020   NA 136 04/11/2020   K 4.9 04/11/2020   CL 98 04/11/2020   CO2 31 04/11/2020   Lab Results  Component Value Date   CHOL 114 04/11/2020   CHOL 138 04/08/2019   CHOL 149 04/06/2018   Lab Results  Component Value Date   HDL 36 (L) 04/11/2020   HDL 38.60 (L) 04/08/2019   HDL 42.20 04/06/2018   Lab Results  Component Value Date   LDLCALC 59 04/11/2020   LDLCALC 78 04/08/2019   LDLCALC 80 04/06/2018   Lab Results  Component Value Date   TRIG 103 04/11/2020   TRIG 106.0 04/08/2019   TRIG 137.0 04/06/2018   Lab Results  Component Value Date   CHOLHDL 3.2 04/11/2020   CHOLHDL 4 04/08/2019   CHOLHDL 4 04/06/2018   No results found for: LDLDIRECT The ASCVD Risk score Mikey Bussing DC Jr., et al., 2013) failed to calculate for the following reasons:   The valid total cholesterol range is 130 to 320 mg/dL I have reviewed the PMH, Fam and Soc history. Patient Active Problem List   Diagnosis Date Noted  . Combined hyperlipidemia associated with type 2 diabetes mellitus ([place]) 07/09/2019    Priority: High  . Coronary artery disease involving native coronary artery of native heart without angina pectoris     Priority: High    Cardiac Cath 01/2019: Severe  single-vessel disease with additional moderate two-vessel disease: 100% CTO of RCA, moderate disease in proximal LCx, distal LCx and very small caliber 1st Diag DFR-FFR negative lesion in proximal LCx Normal LV pressures with previously document, ed normal EF.   . Encounter for screening for lung cancer 11/09/2018    Priority: High    10/2018: Chest CT: neg lung cancer: + COPD changes and cardiovascular calcifications   . Smoking greater than 30 pack years - quit 03/2018 10/03/2017    Priority: High  . Type 2 diabetes mellitus with peripheral neuropathy ([place]) 07/12/2014    Priority: High  . COPD (chronic obstructive pulmonary disease) ([place]) 01/07/2014    Priority: High    PFTs 07/2018 minimal airway obstruction, overinflation and no response to bronchodilators.    Marland Kitchen  Chronic pain 10/07/2013    Priority: High  . OSA on CPAP 10/07/2013    Priority: High  . Essential hypertension 08/31/2010    Priority: High    Elevated ASCVD risk - started lipitor 04/2013   . BPH without obstruction/lower urinary tract symptoms 10/03/2017    Priority: Medium  . Gastroesophageal reflux disease 01/10/2015    Priority: Medium  . Primary osteoarthritis involving multiple joints 01/10/2015    Priority: Medium  . 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    Priority: Medium    Hypogonadism - pineal glad abnormalities On testosterone replacement   . Rosacea 10/03/2017    Priority: Low  . 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    Priority: Low  . Thoracic and lumbosacral neuritis   . Obesity (BMI 30-39.9)   . Hypertension   . Hyperlipidemia   . History of lumbar surgery   . Diabetes mellitus without complication ([place])   . DDD (degenerative disc disease), lumbar   . Chronic low back pain with right-sided sciatica   . Chronic left shoulder pain   . CAD (coronary artery disease)     cath 2020, single vessel disease, medical mgt   . BMI 30.0-30.9,adult   . Controlled type 2 diabetes mellitus without complication,  without long-term current use of insulin ([place]) 07/12/2014    Social History: Patient  reports that he quit smoking about 2 years ago. His smoking use included cigarettes. He has a 15.00 pack-year smoking history. He has never used smokeless tobacco. He reports previous alcohol use. He reports previous drug use.  Review of Systems: Ophthalmic: negative for eye pain, loss of vision or double vision Cardiovascular: negative for chest pain Respiratory: negative for SOB or persistent cough Gastrointestinal: negative for abdominal pain Genitourinary: negative for dysuria or gross hematuria MSK: negative for foot lesions Neurologic: negative for weakness or gait disturbance  Objective  Vitals: BP 130/72   Pulse 73   Temp 98.3 F (36.8 C)   Ht 6\' 2"  (1.88 m)   Wt 240 lb 3.2 oz (109 kg)   SpO2 97%   BMI 30.84 kg/m  General: well appearing, no acute distress  Psych:  Alert and oriented, normal mood and affect Cardiovascular:  Nl S1 and S2, RRR without murmur, gallop or rub. no edema Respiratory:  Good breath sounds bilaterally, CTAB with normal effort, no rales Foot exam: no erythema, pallor, or cyanosis visible nl proprioception and sensation to monofilament testing bilaterally, +2 distal pulses bilaterally    Diabetic education: ongoing education regarding chronic disease management for diabetes was given today. We continue to reinforce the ABC's of diabetic management: A1c (<7 or 8 dependent upon patient), tight blood pressure control, and cholesterol management with goal LDL < 100 minimally. We discuss diet strategies, exercise recommendations, medication options and possible side effects. At each visit, we review recommended immunizations and preventive care recommendations for diabetics and stress that good diabetic control can prevent other problems. See below for this patient's data.    Commons side effects, risks, benefits, and alternatives for medications and treatment plan  prescribed today were discussed, and the patient expressed understanding of the given instructions. Patient is instructed to call or message via MyChart if he/she has any questions or concerns regarding our treatment plan. No barriers to understanding were identified. We discussed Red Flag symptoms and signs in detail. Patient expressed understanding regarding what to do in case of urgent or emergency type symptoms.   Medication list was reconciled, printed and provided to the patient  in AVS. Patient instructions and summary information was reviewed with the patient as documented in the AVS. This note was prepared with assistance of Dragon voice recognition software. Occasional wrong-word or sound-a-like substitutions may have occurred due to the inherent limitations of voice recognition software  This visit occurred during the SARS-CoV-2 public health emergency.  Safety protocols were in place, including screening questions prior to the visit, additional usage of staff PPE, and extensive cleaning of exam room while observing appropriate contact time as indicated for disinfecting solutions.

## 2020-07-12 NOTE — Patient Instructions (Signed)
Please return in 6 months for diabetes and blood pressure recheck.   Please take the prescription for Shingrix to the pharmacy so they may administer the vaccinations. Your insurance will then cover the injections.   If you have any questions or concerns, please don't hesitate to send me a message via MyChart or call the office at [phone]. Thank you for visiting with [place] today! It's our pleasure caring for you.

## 2020-07-14 DIAGNOSIS — N401 Enlarged prostate with lower urinary tract symptoms: Secondary | ICD-10-CM | POA: Diagnosis not present

## 2020-07-14 DIAGNOSIS — E230 Hypopituitarism: Secondary | ICD-10-CM | POA: Diagnosis not present

## 2020-07-18 ENCOUNTER — Other Ambulatory Visit: Payer: Self-pay | Admitting: Family Medicine

## 2020-07-21 DIAGNOSIS — E291 Testicular hypofunction: Secondary | ICD-10-CM | POA: Diagnosis not present

## 2020-07-21 DIAGNOSIS — R3914 Feeling of incomplete bladder emptying: Secondary | ICD-10-CM | POA: Diagnosis not present

## 2020-07-21 DIAGNOSIS — N401 Enlarged prostate with lower urinary tract symptoms: Secondary | ICD-10-CM | POA: Diagnosis not present

## 2020-07-29 ENCOUNTER — Other Ambulatory Visit: Payer: Self-pay | Admitting: Family Medicine

## 2020-08-27 ENCOUNTER — Other Ambulatory Visit: Payer: Self-pay

## 2020-08-27 ENCOUNTER — Other Ambulatory Visit: Payer: Self-pay | Admitting: Family Medicine

## 2020-08-28 ENCOUNTER — Other Ambulatory Visit: Payer: Self-pay | Admitting: Family Medicine

## 2020-08-28 MED ORDER — ACETAMINOPHEN-CODEINE #3 300-30 MG PO TABS: ORAL_TABLET | ORAL | 0 refills | Status: DC

## 2020-08-28 MED ORDER — NITROGLYCERIN 0.4 MG SL SUBL: 0.4000 mg | SUBLINGUAL_TABLET | SUBLINGUAL | 0 refills | Status: AC | PRN

## 2020-08-28 NOTE — Telephone Encounter (Signed)
Last refill: 06/15/20 #90, 0 Last OV: 07/12/20 dx. DM, HTN f/u

## 2020-08-28 NOTE — Telephone Encounter (Signed)
Refill sent to pharmacy.   

## 2020-09-18 ENCOUNTER — Other Ambulatory Visit: Payer: Self-pay | Admitting: Family Medicine

## 2020-10-09 ENCOUNTER — Telehealth: Payer: Self-pay | Admitting: Pulmonary Disease

## 2020-10-09 MED ORDER — STIOLTO RESPIMAT 2.5-2.5 MCG/ACT IN AERS: 2.0000 | INHALATION_SPRAY | Freq: Every day | RESPIRATORY_TRACT | 0 refills | Status: DC

## 2020-10-09 NOTE — Telephone Encounter (Signed)
PA request was received from (pharmacy): Yes OEHOZ:[phone] Fax:  Medication name and strength: Stiolto Ordering Provider: Dr. Vaughan Browner   Was PA started with [facility]?: Yes If yes, please enter KEY: [id number] Medication tried and failed: N/A Covered Alternatives: N/A  Attempted the PA online, received a message stating that a PA is not needed. Per [facility], this is not true as they are still showing a rejection message. Will print and fill out a PA form to fax to [place].

## 2020-10-10 MED ORDER — STIOLTO RESPIMAT 2.5-2.5 MCG/ACT IN AERS: 2.0000 | INHALATION_SPRAY | Freq: Every day | RESPIRATORY_TRACT | 11 refills | Status: AC

## 2020-10-10 NOTE — Telephone Encounter (Signed)
Called and spoke with Anderson Malta at pharmacy at [facility] to let them know that we have received a fax from [facility] saying that PA is not needed and that medication is covered under patient's plan. She states that when she tried to run it is says that it does need a PA. Called [facility] and and was told that they couldn't talk to me because the zip code they have does not match what I have. Called and spoke with patient to ask him to verify address for me. The address he told me is the same but the zip code does not match. He also informed me that for his prescriptions he uses [place]. The zip code that they have on file for him is [address] and [place] is [address]. Called and informed patient to let him know that they have the wrong zip code on file and if he could call them to check that they have the correct information for him. Patient expressed understanding. [facility] claims number at [phone] and spoke with Paulo Fruit who states that they do indeed have the [address] zip code on file. Asked him to check on status of Stiolto for patient because we are getting information stating that PA is not needed. He confirmed that PA is not needed and gave me help desk number of [phone]. Called that number and spoke with [place] who states that the patient is in the coverage gap stage and that she is showing that the inhaler is ready to be picked up at [facility] and that it would be $127.78. And stated that the patient would be that price until he met the out of pocket price of $4430. Called and notified patient and gave him the above information and offered to send him patient assistance he states that he makes to much. Patient expressed understanding about above prices and stated that is what he had to do last year. Also stated that he called and got his address and zip code fixed. Nothing further needed at this time.

## 2020-10-10 NOTE — Telephone Encounter (Signed)
Received fax back from [facility] and it stated that   PA is not required Medication is covered without PA   Will forward to PM and LT to follow up on.

## 2020-10-10 NOTE — Telephone Encounter (Signed)
Please send in a prescription for stiolto

## 2020-10-10 NOTE — Telephone Encounter (Signed)
Patient my chart message sent advising Patient Stiolto is approved and to contact office with any issues or concerns.

## 2020-10-10 NOTE — Telephone Encounter (Signed)
Me     5:15 PM Note Called and spoke with Jennifer at pharmacy at [facility] to let them know that we have received a fax from [facility] saying that PA is not needed and that medication is covered under patient's plan. She states that when she tried to run it is says that it does need a PA. Called [facility] and and was told that they couldn't talk to me because the zip code they have does not match what I have. Called and spoke with patient to ask him to verify address for me. The address he told me is the same but the zip code does not match. He also informed me that for his prescriptions he uses [facility]. The zip code that they have on file for him is [address] and [place] is [address]. Called and informed patient to let him know that they have the wrong zip code on file and if he could call them to check that they have the correct information for him. Patient expressed understanding. Called [facility] claims number at [phone] and spoke with Roland who states that they do indeed have the [address] zip code on file. Asked him to check on status of Stiolto for patient because we are getting information stating that PA is not needed. He confirmed that PA is not needed and gave me help desk number of [phone]. Called that number and spoke with Tarnisha who states that the patient is in the coverage gap stage and that she is showing that the inhaler is ready to be picked up at [facility] and that it would be $127.78. And stated that the patient would be that price until he met the out of pocket price of $4430. Called and notified patient and gave him the above information and offered to send him patient assistance he states that he makes to much. Patient expressed understanding about above prices and stated that is what he had to do last year. Also stated that he called and got his address and zip code fixed. Nothing further needed at this time.       

## 2020-10-11 ENCOUNTER — Telehealth: Payer: Self-pay | Admitting: Pulmonary Disease

## 2020-10-11 NOTE — Telephone Encounter (Signed)
Called and spoke with Anderson Malta at [place] to let her know everything I found out yesterday in regards to the inhaler. She expressed understanding. Nothing further needed at this time.

## 2020-10-23 ENCOUNTER — Other Ambulatory Visit: Payer: Self-pay | Admitting: Family Medicine

## 2020-10-23 MED ORDER — ACETAMINOPHEN-CODEINE #3 300-30 MG PO TABS: ORAL_TABLET | ORAL | 0 refills | Status: DC

## 2020-10-25 ENCOUNTER — Other Ambulatory Visit: Payer: Self-pay | Admitting: Family Medicine

## 2020-11-03 ENCOUNTER — Other Ambulatory Visit: Payer: Self-pay

## 2020-11-03 ENCOUNTER — Ambulatory Visit
Admission: RE | Admit: 2020-11-03 | Discharge: 2020-11-03 | Disposition: A | Discharge status: Home / Self Care | Payer: Medicare Other | Source: Ambulatory Visit | Attending: Acute Care | Admitting: Acute Care

## 2020-11-03 DIAGNOSIS — J189 Pneumonia, unspecified organism: Secondary | ICD-10-CM | POA: Diagnosis not present

## 2020-11-03 DIAGNOSIS — J8410 Pulmonary fibrosis, unspecified: Secondary | ICD-10-CM | POA: Diagnosis not present

## 2020-11-03 DIAGNOSIS — Z87891 Personal history of nicotine dependence: Secondary | ICD-10-CM

## 2020-11-03 DIAGNOSIS — I2510 Atherosclerotic heart disease of native coronary artery without angina pectoris: Secondary | ICD-10-CM | POA: Diagnosis not present

## 2020-11-07 ENCOUNTER — Encounter: Payer: Self-pay | Admitting: Family Medicine

## 2020-11-09 DIAGNOSIS — M47816 Spondylosis without myelopathy or radiculopathy, lumbar region: Secondary | ICD-10-CM | POA: Diagnosis not present

## 2020-11-09 NOTE — Progress Notes (Signed)
Please call patient and let them  know their  low dose Ct was read as a Lung RADS 1, negative study: no nodules or definitely benign nodules. Radiology recommendation is for a repeat LDCT in 12 months. .Please let them  know we will order and schedule their  annual screening scan for 11/2021. Please let them  know there was notation of CAD on their  scan.  Please remind the patient  that this is a non-gated exam therefore degree or severity of disease  cannot be determined. Please have them  follow up with their PCP regarding potential risk factor modification, dietary therapy or pharmacologic therapy if clinically indicated. Pt.  is  currently on statin therapy. Please place order for annual  screening scan for  11/2021 and fax results to PCP. Thanks so much.  I have asked Lauren to add him to my schedule for 11/15/2020 as follow up for the pneumonia. Thanks so much

## 2020-11-10 ENCOUNTER — Telehealth: Payer: Self-pay | Admitting: Acute Care

## 2020-11-10 ENCOUNTER — Other Ambulatory Visit: Payer: Self-pay | Admitting: Family Medicine

## 2020-11-10 NOTE — Telephone Encounter (Signed)
Message received from Eric Form  Could you add this patient to my last open spot 11/15/2020 ( 3:30 appointment) Follow up Pneumonia seen on low dose screening CT. Thanks so much  No available slot on 11/15/20 patient scheduled for 11/20/20 at 1030.   Nothing further needed at this time.

## 2020-11-17 ENCOUNTER — Other Ambulatory Visit: Payer: Self-pay | Admitting: Family Medicine

## 2020-11-20 ENCOUNTER — Ambulatory Visit (INDEPENDENT_AMBULATORY_CARE_PROVIDER_SITE_OTHER): Payer: Medicare Other | Admitting: Acute Care

## 2020-11-20 ENCOUNTER — Other Ambulatory Visit: Payer: Self-pay

## 2020-11-20 ENCOUNTER — Ambulatory Visit: Payer: Medicare Other

## 2020-11-20 ENCOUNTER — Other Ambulatory Visit: Payer: Self-pay | Admitting: *Deleted

## 2020-11-20 ENCOUNTER — Encounter: Payer: Self-pay | Admitting: Acute Care

## 2020-11-20 VITALS — BP 130/90 | HR 75 | Ht 74.0 in | Wt 244.2 lb

## 2020-11-20 DIAGNOSIS — I2510 Atherosclerotic heart disease of native coronary artery without angina pectoris: Secondary | ICD-10-CM

## 2020-11-20 DIAGNOSIS — J181 Lobar pneumonia, unspecified organism: Secondary | ICD-10-CM

## 2020-11-20 DIAGNOSIS — J441 Chronic obstructive pulmonary disease with (acute) exacerbation: Secondary | ICD-10-CM

## 2020-11-20 DIAGNOSIS — Z87891 Personal history of nicotine dependence: Secondary | ICD-10-CM

## 2020-11-20 MED ORDER — DOXYCYCLINE HYCLATE 100 MG PO TABS: 100.0000 mg | ORAL_TABLET | Freq: Two times a day (BID) | ORAL | 0 refills | Status: DC

## 2020-11-20 MED ORDER — PREDNISONE 10 MG PO TABS: ORAL_TABLET | ORAL | 0 refills | Status: DC

## 2020-11-20 MED ORDER — AEROCHAMBER MV MISC: 0 refills | Status: AC

## 2020-11-20 NOTE — Progress Notes (Signed)
History of Present Illness Jason Snyder is a 70 y.o. male former smoker ( Quit 2019) with COPD, history of diabetes, hyperlipidemia, hypertension.  He is followed through the lung cancer screening program, and by Dr. Vaughan Browner. He had recent PFTs that showed mild obstructive airway changes with air trapping   11/20/2020 Pt presents for follow up of an abnormal low dose CT. Chest. From a Lung Cancer Screening perspective, his scan was read as a Lung RADS 1, negative study: no nodules or definitely benign nodules. Radiology recommendation is for a repeat LDCT in 12 months. There was notation of a right middle lobe mild infection/ pneumonia on the scan. When I called the patient with his results, he confirmed that he has been sick. Today was the first date he and I both  had availability for him to be seen.  He states that he noted that he had developed a cough with green secretions. He has been more short of breath than his baseline, more wheezing than baseline. He states his cough is no longer productive, and he has chest congestion. He states he has not had fever. He did test for Covid , and it was negative.   Test Results: 11/03/2020 Low Dose CT Chest ( Screening)  Lung-RADS 1, negative. Continue annual screening with low-dose chest CT without contrast in 12 months.   Patchy/ground-glass opacity in the right middle lobe, new. This appearance favors mild infection/pneumonia. Right middle lobe syndrome could have this appearance.  CBC Latest Ref Rng & Units 04/11/2020 04/08/2019 03/03/2019  WBC 3.8 - 10.8 Thousand/uL 7.0 5.7 -  Hemoglobin 13.2 - 17.1 g/dL 17.1 16.5 16.5  Hematocrit 38.5 - 50.0 % 49.9 49.6 49  Platelets 140 - 400 Thousand/uL 257 245.0 -    BMP Latest Ref Rng & Units 04/11/2020 04/08/2019 12/24/2018  Glucose 65 - 99 mg/dL 128(H) 135(H) 123(H)  BUN 7 - 25 mg/dL 14 14 10   Creatinine 0.70 - 1.25 mg/dL 1.02 0.95 0.96  BUN/Creat Ratio 6 - 22 (calc) NOT APPLICABLE - 10  Sodium 135 - 146  mmol/L 136 138 135  Potassium 3.5 - 5.3 mmol/L 4.9 4.8 4.3  Chloride 98 - 110 mmol/L 98 99 96  CO2 20 - 32 mmol/L 31 30 23   Calcium 8.6 - 10.3 mg/dL 9.6 9.8 10.0    BNP No results found for: BNP  ProBNP No results found for: PROBNP  PFT    Component Value Date/Time   FEV1PRE 3.14 07/14/2018 1153   FEV1POST 3.31 07/14/2018 1153   FVCPRE 4.79 07/14/2018 1153   FVCPOST 4.86 07/14/2018 1153   TLC 9.16 07/14/2018 1153   DLCOUNC 34.06 07/14/2018 1153   PREFEV1FVCRT 66 07/14/2018 1153   PSTFEV1FVCRT 68 07/14/2018 1153    CT CHEST LUNG CA SCREEN LOW DOSE W/O CM  Result Date: 11/03/2020 CLINICAL DATA:  70 year old male former smoker, quit 2 years ago, with 60 pack-year history of smoking, for follow-up lung cancer screening EXAM: CT CHEST WITHOUT CONTRAST LOW-DOSE FOR LUNG CANCER SCREENING TECHNIQUE: Multidetector CT imaging of the chest was performed following the standard protocol without IV contrast. COMPARISON:  Low-dose lung cancer screening CT chest dated 11/03/2019 FINDINGS: Cardiovascular: The heart is normal in size. No pericardial effusion. No evidence of thoracic aortic aneurysm. Atherosclerotic calcifications of the aortic arch. Three vessel coronary atherosclerosis. Mediastinum/Nodes: No suspicious mediastinal lymphadenopathy. Visualized thyroid is unremarkable. Lungs/Pleura: Patchy/ground-glass opacity in volume loss in the right middle lobe (series 3/image 18), new. Scattered calcified granulomata, benign. No pleural effusion  or pneumothorax. Upper Abdomen: Visualized upper abdomen notable for fatty parenchymal replacement of the pancreas with scattered parenchymal calcifications and vascular calcifications. Musculoskeletal: Visualized osseous structures are within normal limits. IMPRESSION: Lung-RADS 1, negative. Continue annual screening with low-dose chest CT without contrast in 12 months. Patchy/ground-glass opacity in the right middle lobe, new. This appearance favors mild  infection/pneumonia. Right middle lobe syndrome could have this appearance. Aortic Atherosclerosis (ICD10-I70.0). Electronically Signed   By: Julian Hy M.D.   On: 11/03/2020 16:59     Past medical hx Past Medical History:  Diagnosis Date   BMI 30.0-30.9,adult    BPH without obstruction/lower urinary tract symptoms 10/03/2017   CAD (coronary artery disease)    cath 2020, single vessel disease, medical mgt   Chronic left shoulder pain c   Chronic low back pain with right-sided sciatica    Chronic pain 10/07/2013   Combined hyperlipidemia associated with type 2 diabetes mellitus ([place]) 07/09/2019   Controlled type 2 diabetes mellitus without complication, without long-term current use of insulin ([place]) 07/12/2014   COPD (chronic obstructive pulmonary disease) ([place]) 01/07/2014   PFTs 07/2018 minimal airway obstruction, overinflation and no response to bronchodilators.    Coronary artery disease involving native coronary artery of native heart without angina pectoris    Cardiac Cath 01/2019: Severe single-vessel disease with additional moderate two-vessel disease: 100% CTO of RCA, moderate disease in proximal LCx, distal LCx and very small caliber 1st Diag DFR-FFR negative lesion in proximal LCx Normal LV pressures with previously document, ed normal EF.   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   Hypogonadism - pineal glad abnormalities On testosterone replacement   Obesity (BMI 30-39.9)    Obesity (BMI 30.0-34.9) 10/03/2017   OSA on CPAP 10/07/2013   Primary  osteoarthritis involving multiple joints 01/10/2015   Rosacea    Smoking greater than 30 pack years - quit 03/2018 10/03/2017   Thoracic and lumbosacral neuritis    Type 2 diabetes mellitus with peripheral neuropathy ([place]) 07/12/2014     Social History   Tobacco Use   Smoking status: Former    Packs/day: 0.50    Years: 30.00    Pack years: 15.00    Types: Cigarettes    Quit date: 03/05/2018    Years since quitting: 2.7   Smokeless tobacco: Never   Tobacco comments:    no cigarettes since 03/05/18  Vaping Use   Vaping Use: Never used  Substance Use Topics   Alcohol use: Not Currently   Drug use: Not Currently    Mr.Calder reports that he quit smoking about 2 years ago. His smoking use included cigarettes. He has a 15.00 pack-year smoking history. He has never used smokeless tobacco. He reports previous alcohol use. He reports previous drug use.  Tobacco Cessation: Quit smoking 2019 with a 30 pack year smoking history. Counseled to quit smoking.   Past surgical hx, Family hx, Social hx all reviewed.  Current Outpatient Medications on File Prior to Visit  Medication Sig   acetaminophen-codeine (TYLENOL #3) 300-30 MG tablet TAKE ONE  TABLET BY MOUTH EVERY SIX HOURS AS NEEDED FOR MODERATE PAIN   acetic acid 2 % otic solution Place 4 drops into both ears 2 (two) times daily as needed.   albuterol (VENTOLIN HFA) 108 (90 Base) MCG/ACT inhaler INHALE TWO PUFFS BY MOUTH INTO THE LUNGS EVERY FOUR HOURS AS NEEDED FOR WHEEZING   amLODipine (NORVASC) 10 MG tablet TAKE ONE TABLET BY MOUTH ONE TIME DAILY   aspirin 81 MG EC tablet Take 81 mg by mouth daily.    atorvastatin (LIPITOR) 20 MG tablet Take 1 tablet (20 mg total) by mouth at bedtime.   ciprofloxacin-dexamethasone (CIPRODEX) OTIC suspension Place 4 drops into both ears 2 (two) times daily.   cyclobenzaprine (FLEXERIL) 10 MG tablet Take 1 tablet (10 mg total) by mouth 3 (three) times daily as needed for muscle spasms.   diclofenac sodium  (VOLTAREN) 1 % GEL Apply 2 g topically 4 (four) times daily as needed (pain).   FARXIGA 10 MG TABS tablet TAKE ONE TABLET BY MOUTH ONE TIME DAILY   fluticasone (FLONASE) 50 MCG/ACT nasal spray Place 1 spray into the nose daily as needed for allergies.    gabapentin (NEURONTIN) 300 MG capsule Take 1 capsule (300 mg total) by mouth 2 (two) times daily.   hydrochlorothiazide (HYDRODIURIL) 25 MG tablet Take 25 mg by mouth daily.   Ketotifen Fumarate (ALLERGY EYE DROPS OP) Place 1 drop into both eyes daily.   Loratadine 10 MG CAPS Take 10 mg by mouth daily as needed (allergies).    losartan-hydrochlorothiazide (HYZAAR) 100-25 MG tablet TAKE ONE TABLET BY MOUTH ONE TIME DAILY   meloxicam (MOBIC) 15 MG tablet Take 15 mg by mouth daily.   metFORMIN (GLUCOPHAGE) 500 MG tablet TAKE ONE TABLET BY MOUTH TWICE A DAY WITH MEALS   metoprolol tartrate (LOPRESSOR) 50 MG tablet Take 1 tablet (50 mg total) by mouth 2 (two) times daily.   Multiple Vitamin (MULTIVITAMIN) capsule Take 1 capsule by mouth daily.    nitroGLYCERIN (NITROSTAT) 0.4 MG SL tablet Place 1 tablet (0.4 mg total) under the tongue every 5 (five) minutes as needed for chest pain.   tamsulosin (FLOMAX) 0.4 MG CAPS capsule TAKE TWO CAPSULES BY MOUTH DAILY   testosterone cypionate (DEPOTESTOSTERONE CYPIONATE) 200 MG/ML injection INJECT 1ML INTO THE MUSCLE EVERY 14 DAYS   Tiotropium Bromide-Olodaterol (STIOLTO RESPIMAT) 2.5-2.5 MCG/ACT AERS Inhale 2 puffs into the lungs daily.   Tiotropium Bromide-Olodaterol (STIOLTO RESPIMAT) 2.5-2.5 MCG/ACT AERS Inhale 2 puffs into the lungs daily.   triamcinolone cream (KENALOG) 0.1 % Apply 1 application topically 2 (two) times daily. For 2 weeks, then as needed   No current facility-administered medications on file prior to visit.     Allergies  Allergen Reactions   Rubus Fruticosus Rash    Blackberries    Review Of Systems:  Constitutional:   No  weight loss, night sweats,  Fevers, chills, fatigue, or   lassitude.  HEENT:   No headaches,  Difficulty swallowing,  Tooth/dental problems, or  Sore throat,                No sneezing, itching, ear ache, nasal congestion, post nasal drip,   CV:  No chest pain,  Orthopnea, PND, swelling in lower extremities, anasarca, dizziness, palpitations, syncope.   GI  No heartburn, indigestion, abdominal pain, nausea, vomiting, diarrhea, change in bowel habits, loss of appetite, bloody stools.   Resp: + shortness of breath with exertion or at rest.  + excess mucus, no productive cough,  +  non-productive cough,  No coughing up of blood.  + change in color of mucus.  + wheezing.  No chest wall deformity  Skin: no rash or lesions.  GU: no dysuria, change in color of urine, no urgency or frequency.  No flank pain, no hematuria   MS:  + joint pain or swelling.  No decreased range of motion.  No back pain.  Psych:  No change in mood or affect. No depression or anxiety.  No memory loss.   Vital Signs BP 130/90 (BP Location: Right Arm, Patient Position: Sitting, Cuff Size: Normal)   Pulse 75   Ht 6\' 2"  (1.88 m)   Wt 244 lb 3.2 oz (110.8 kg)   SpO2 97%   BMI 31.35 kg/m    Physical Exam:  General- No distress,  A&Ox3, pleasant ENT: No sinus tenderness, TM clear, pale nasal mucosa, no oral exudate,no post nasal drip, no LAN Cardiac: S1, S2, regular rate and rhythm, no murmur Chest: + wheeze/ rales/ + dullness; no accessory muscle use, no nasal flaring, no sternal retractions Abd.: Soft Non-tender, ND, BS +, Body mass index is 31.35 kg/m.  Ext: No clubbing cyanosis, edema Neuro:  normal strength, MAE x 4, A&O x 3 Skin: No rashes, warm and dry, intact Psych: normal mood and behavior   Assessment/Plan Mild Right Middle Lobe Pneumonia Tested Covid Negative COPD Plan We will treat you with Doxycycline 100 mg twice daily x 1 week.  Use Sun Block if you go out in the sun. Take with a full glass of water We will also send in a prednisone taper  . Prednisone taper; 10 mg tablets: 4 tabs x 2 days, 3 tabs x 2 days, 2 tabs x 2 days 1 tab x 2 days then stop.  Continue Stialto as you have been doing. We will get a spacer for you to use with your inhalers. We will also get you a flutter valve . Use this 4-6 times daily. Use with Mucinex 1200 mg once daily with a full glass of water.  Call and check with [facility] to ensure they are aware we are treating your pneumonia. Follow up  with Dr. Vaughan Browner as is already scheduled 11/30/2020  with CXR prior to ensure you are better. If you get worse , not better call to be seen sooner or seek emergency care.  PFT's after follow up when better to evaluate for progression of COPD.  Please contact office for sooner follow up if symptoms do not improve or worsen or seek emergency care    History of Tobacco Abuse Quit 2019 with a 30 pack year smoking history Plan Counseled to remain smoke free Follow up Low Dose CT Chest 11/2021  I spent 40 minutes dedicated to the care of this patient on the date of this encounter to include pre-visit review of records, face-to-face time with the patient discussing conditions above, post visit ordering of testing, clinical documentation with the electronic health record, making appropriate referrals as documented, and communicating necessary information to the patient's healthcare team.    Magdalen Spatz, NP 11/20/2020  11:03 AM

## 2020-11-20 NOTE — Patient Instructions (Addendum)
It is good to see you today. We will treat you with Doxycycline 100 mg twice daily x 1 week.  Use Sun Block if you go out in the sun. Take with a full glass of water We will also send in a prednisone taper . Prednisone taper; 10 mg tablets: 4 tabs x 2 days, 3 tabs x 2 days, 2 tabs x 2 days 1 tab x 2 days then stop.  Continue Stialto as you have been doing. We will get a spacer for you to use with your inhalers. We will also get you a flutter valve . Use this 4-6 times daily. Use with Mucinex 1200 mg once daily with a full glass of water.  Call and check with [facility] I[facility] to ensure they are aware we are treating your pneumonia. Follow up  with Dr. Vaughan Browner as is already scheduled 11/30/2020  with CXR prior to ensure you are better. If you get worse , not better call to be seen sooner or seek emergency care.  PFT's after follow up when better to evaluate for progression of COPD.  Please contact office for sooner follow up if symptoms do not improve or worsen or seek emergency care

## 2020-11-30 ENCOUNTER — Ambulatory Visit (INDEPENDENT_AMBULATORY_CARE_PROVIDER_SITE_OTHER): Payer: Medicare Other | Admitting: Pulmonary Disease

## 2020-11-30 ENCOUNTER — Encounter: Payer: Self-pay | Admitting: Pulmonary Disease

## 2020-11-30 ENCOUNTER — Ambulatory Visit (INDEPENDENT_AMBULATORY_CARE_PROVIDER_SITE_OTHER): Payer: Medicare Other

## 2020-11-30 ENCOUNTER — Other Ambulatory Visit: Payer: Self-pay

## 2020-11-30 VITALS — BP 122/78 | HR 69 | Temp 97.9°F | Ht 74.0 in | Wt 242.4 lb

## 2020-11-30 DIAGNOSIS — R918 Other nonspecific abnormal finding of lung field: Secondary | ICD-10-CM | POA: Diagnosis not present

## 2020-11-30 DIAGNOSIS — Z87891 Personal history of nicotine dependence: Secondary | ICD-10-CM

## 2020-11-30 DIAGNOSIS — J441 Chronic obstructive pulmonary disease with (acute) exacerbation: Secondary | ICD-10-CM | POA: Diagnosis not present

## 2020-11-30 DIAGNOSIS — J181 Lobar pneumonia, unspecified organism: Secondary | ICD-10-CM

## 2020-11-30 DIAGNOSIS — I2510 Atherosclerotic heart disease of native coronary artery without angina pectoris: Secondary | ICD-10-CM

## 2020-11-30 MED ORDER — AZITHROMYCIN 250 MG PO TABS: ORAL_TABLET | ORAL | 0 refills | Status: DC

## 2020-11-30 MED ORDER — PREDNISONE 20 MG PO TABS: ORAL_TABLET | ORAL | 0 refills | Status: DC

## 2020-11-30 NOTE — Progress Notes (Signed)
Results will be discussed at OV with Dr. Vaughan Browner 11/30/2020. See 6/30 OV note

## 2020-11-30 NOTE — Progress Notes (Signed)
Jason Snyder    [id number]    01/12/1951  Primary Care Physician:Andy, Karie Fetch, MD  Referring Physician: [name], [place],  [place] [id number]  Chief complaint: Follow up for COPD, OSA  HPI: 70 year old ex-smoker with history of diabetes, hyperlipidemia, hypertension.  Here for evaluation of COPD. He had recent PFTs that showed mild obstructive airway changes with air trapping Chief complaint is dyspnea on exertion.  He has occasional cough with white mucus.  No wheezing He quit smoking in October 2019 and reports that overall his breathing has improved since then though he is gained some weight.   Initially on Atrovent. Started on [employer] in 2020  Pets: Dog, no cats, birds, farm animals Occupation: Retired Environmental manager facility Exposures: No known exposures, no mold, hot tub, Jacuzzi Smoking history: 50-75-pack-year smoker.  Quit in October 2019 Travel history: Originally from [place].  He had lived in [place] most of his life.  No recent travel Relevant family history: Father died of lung cancer, he was a smoker  Interim history: Continues on stiolto Low-dose screening CT in early June showed right middle lobe infiltrate.  He was prescribed doxycycline and prednisone by our office.  He notes some improvement in symptoms but continues to have purulent green mucus, dyspnea with wheezing. Denies any fevers, chills  Outpatient Encounter Medications as of 11/30/2020  Medication Sig   acetaminophen-codeine (TYLENOL #3) 300-30 MG tablet TAKE ONE TABLET BY MOUTH EVERY SIX HOURS AS NEEDED FOR MODERATE PAIN   acetic acid 2 % otic solution Place 4 drops into both ears 2 (two) times daily as needed.   albuterol (VENTOLIN HFA) 108 (90 Base) MCG/ACT inhaler INHALE TWO PUFFS BY MOUTH INTO THE LUNGS EVERY FOUR HOURS AS NEEDED FOR WHEEZING   amLODipine (NORVASC) 10 MG tablet TAKE ONE TABLET BY MOUTH ONE TIME DAILY   aspirin 81  MG EC tablet Take 81 mg by mouth daily.    atorvastatin (LIPITOR) 20 MG tablet Take 1 tablet (20 mg total) by mouth at bedtime.   ciprofloxacin-dexamethasone (CIPRODEX) OTIC suspension Place 4 drops into both ears 2 (two) times daily.   cyclobenzaprine (FLEXERIL) 10 MG tablet Take 1 tablet (10 mg total) by mouth 3 (three) times daily as needed for muscle spasms.   diclofenac sodium (VOLTAREN) 1 % GEL Apply 2 g topically 4 (four) times daily as needed (pain).   FARXIGA 10 MG TABS tablet TAKE ONE TABLET BY MOUTH ONE TIME DAILY   fluticasone (FLONASE) 50 MCG/ACT nasal spray Place 1 spray into the nose daily as needed for allergies.    gabapentin (NEURONTIN) 300 MG capsule Take 1 capsule (300 mg total) by mouth 2 (two) times daily.   hydrochlorothiazide (HYDRODIURIL) 25 MG tablet Take 25 mg by mouth daily.   Ketotifen Fumarate (ALLERGY EYE DROPS OP) Place 1 drop into both eyes daily.   Loratadine 10 MG CAPS Take 10 mg by mouth daily as needed (allergies).    losartan-hydrochlorothiazide (HYZAAR) 100-25 MG tablet TAKE ONE TABLET BY MOUTH ONE TIME DAILY   meloxicam (MOBIC) 15 MG tablet Take 15 mg by mouth daily.   metFORMIN (GLUCOPHAGE) 500 MG tablet TAKE ONE TABLET BY MOUTH TWICE A DAY WITH MEALS   metoprolol tartrate (LOPRESSOR) 50 MG tablet Take 1 tablet (50 mg total) by mouth 2 (two) times daily.   Multiple Vitamin (MULTIVITAMIN) capsule Take 1 capsule by mouth daily.    nitroGLYCERIN (NITROSTAT)  0.4 MG SL tablet Place 1 tablet (0.4 mg total) under the tongue every 5 (five) minutes as needed for chest pain.   Spacer/Aero-Holding Chambers (AEROCHAMBER MV) inhaler Use as instructed   tamsulosin (FLOMAX) 0.4 MG CAPS capsule TAKE TWO CAPSULES BY MOUTH DAILY   testosterone cypionate (DEPOTESTOSTERONE CYPIONATE) 200 MG/ML injection INJECT 1ML INTO THE MUSCLE EVERY 14 DAYS   Tiotropium Bromide-Olodaterol (STIOLTO RESPIMAT) 2.5-2.5 MCG/ACT AERS Inhale 2 puffs into the lungs daily.   Tiotropium  Bromide-Olodaterol (STIOLTO RESPIMAT) 2.5-2.5 MCG/ACT AERS Inhale 2 puffs into the lungs daily.   triamcinolone cream (KENALOG) 0.1 % Apply 1 application topically 2 (two) times daily. For 2 weeks, then as needed   [DISCONTINUED] doxycycline (VIBRA-TABS) 100 MG tablet Take 1 tablet (100 mg total) by mouth 2 (two) times daily.   [DISCONTINUED] predniSONE (DELTASONE) 10 MG tablet Take 4 tabs x 2 days, then 3 x 2d, then 2 x 2d, then 1 x 2d and STOP   No facility-administered encounter medications on file as of 11/30/2020.   Physical Exam: Blood pressure 126/80, pulse 66, height 6\' 2"  (1.88 m), weight 240 lb (108.9 kg), SpO2 97 %. Gen:      No acute distress HEENT:  EOMI, sclera anicteric Neck:     No masses; no thyromegaly Lungs:    Clear to auscultation bilaterally; normal respiratory effort CV:         Regular rate and rhythm; no murmurs Abd:      + bowel sounds; soft, non-tender; no palpable masses, no distension Ext:    No edema; adequate peripheral perfusion Skin:      Warm and dry; no rash Neuro: alert and oriented x 3 Psych: normal mood and affect  Data Reviewed: Imaging: Screening CT chest 11/03/2019-  Centrilobular emphysema, tiny calcified granuloma with no change. Screening CT chest 11/03/2020-emphysema, patchy groundglass opacity in the right middle lobe. Chest x-ray 11/30/2020-improved ventilation in the right middle lobe. I have reviewed the images personally.  PFTs: 07/14/2018 FVC 4.86 [98%], FEV1 3.31 [89%], F/F 68, TLC 123%, DLCO 111% Mild obstruction with overinflation and air trapping.  Labs: CBC 04/06/2018-WBC 7.8, eos 2.1%, absolute eosinophil count 164  Assessment:  Mild COPD Continue stiolto inhaler  Right middle lobe pneumonia Treated with doxycycline and prednisone but continues to be symptomatic with crackles and wheezes.  Chest x-ray today reviewed with improvement in infiltrates but as he continues to have symptoms of purulent sputum I will treat him with a  Z-Pak and prednisone 40 mg a day for 5 days Repeat CT chest without contrast in 3 months for reevaluation  Flutter valve incentive spirometer and Mucinex for mucociliary clearance  Ex-smoker Continue annual low-dose screening CT Follows up with cardiology for coronary artery and aortic atherosclerosis  OSA Stable on CPAP  Health maintenance 06/06/2018-Prevnar 06/04/2003-Pneumovax  Plan/Recommendations: - Continue Stiolto. - Continue CPAP.  - Z-Pak, prednisone - Follow-up CT in 3 months  Marshell Garfinkel MD [facility] 11/30/2020, 9:10 AM  CC: [name], MD

## 2020-11-30 NOTE — Patient Instructions (Signed)
We will treat the pneumonia again with a Z-Pak and prednisone 40 mg a day for 5 days Continue the inhaler We will get a CT chest without contrast in 3 months to ensure clearance of pneumonia  Follow-up in 3 months after CT scan

## 2020-11-30 NOTE — Addendum Note (Signed)
Addended by: Elton Sin on: 11/30/2020 02:59 PM   Modules accepted: Orders

## 2020-11-30 NOTE — Progress Notes (Signed)
Called and spoke with Patient.  Patient stated he had a flutter valve and used his Stiolto inhaler as directed daily.  Patient does not have a incentive spirometry. DME order placed to provide Patient incentive spirometry.

## 2020-12-03 ENCOUNTER — Other Ambulatory Visit: Payer: Self-pay | Admitting: Family Medicine

## 2020-12-14 ENCOUNTER — Encounter: Payer: Self-pay | Admitting: Cardiology

## 2020-12-14 ENCOUNTER — Ambulatory Visit (INDEPENDENT_AMBULATORY_CARE_PROVIDER_SITE_OTHER): Payer: Medicare Other | Admitting: Cardiology

## 2020-12-14 ENCOUNTER — Other Ambulatory Visit: Payer: Self-pay

## 2020-12-14 VITALS — BP 126/70 | HR 70 | Ht 74.0 in | Wt 243.0 lb

## 2020-12-14 DIAGNOSIS — I10 Essential (primary) hypertension: Secondary | ICD-10-CM

## 2020-12-14 DIAGNOSIS — J449 Chronic obstructive pulmonary disease, unspecified: Secondary | ICD-10-CM | POA: Diagnosis not present

## 2020-12-14 DIAGNOSIS — E782 Mixed hyperlipidemia: Secondary | ICD-10-CM | POA: Diagnosis not present

## 2020-12-14 DIAGNOSIS — M79605 Pain in left leg: Secondary | ICD-10-CM | POA: Diagnosis not present

## 2020-12-14 DIAGNOSIS — I2510 Atherosclerotic heart disease of native coronary artery without angina pectoris: Secondary | ICD-10-CM

## 2020-12-14 DIAGNOSIS — R9389 Abnormal findings on diagnostic imaging of other specified body structures: Secondary | ICD-10-CM

## 2020-12-14 NOTE — Progress Notes (Signed)
Cardiology Office Note:    Date:  12/14/2020   ID:  Jason Snyder, DOB 02/16/1951, MRN [id number]  PCP:  [name], MD  Cardiologist:  Shirlee More, MD    Referring MD: [name], MD    ASSESSMENT:    1. Abnormal CT scan   2. Coronary artery disease involving native coronary artery of native heart without angina pectoris   3. Essential hypertension   4. Mixed hyperlipidemia   5. Chronic obstructive pulmonary disease, unspecified COPD type ([place])   6. Pain of left lower extremity    PLAN:    In order of problems listed above:  Not uncommon to have atherosclerosis on CT scan to correlate of his CAD and he is reassured He is having chest pain but it is nonanginal.  We will do an EKG in the office today and continue his medical therapy with aspirin statin beta-blocker. Stable BP at target continue current treatment ARB thiazide diuretic Continue statin due for labs in the next month with his PCP Remains short of breath and symptomatic he is for follow-up with pulmonary and another CT. Check D-dimer as a screen duplex both lower extremities   Next appointment: 6 months   Medication Adjustments/Labs and Tests Ordered: Current medicines are reviewed at length with the patient today.  Concerns regarding medicines are outlined above.  No orders of the defined types were placed in this encounter.  No orders of the defined types were placed in this encounter.   He wanted to be seen regarding his CT of the chest   History of Present Illness:    Jason Snyder is a 70 y.o. male with a hx of CAD COPD hypertension hyperlipidemia last seen 03/21/2020.  Compliance with diet, lifestyle and medications: Yes  He has several concerns. He was bothered by the notation of atherosclerosis in the coronary arteries on CT scan and I told him that this is a common finding in patients with CAD. He has had persistent pulmonary symptoms beginning with cough purulent sputum shortness of  breath and has had several courses of antibiotics and steroids and still has vague tightness in his chest with some pleuritic component to it.  On one occasion he took nitroglycerin and it seemed to help.  This is not typical exertional angina. He has had discomfort in his groin and left thigh he describes as a burning he has some tenderness on physical examination his mother had unprovoked pulmonary embolus and that she died from and to screen him for DVT will do duplex and a D-dimer. He is scheduled for a wellness exam and labs with his primary care physician next month He is having no exertional angina palpitation or syncope but he still finds himself short of breath. He tells me he has plans for follow-up CT scan in a few months.  There is a notation is here today to discuss results of a CT scan he had a lung cancer screening CT scan performed 11/03/2020 showing infiltrate in the right middle lobe favoring infection and pneumonia and was noted to have coronary artery atherosclerosis. He was seen by pulmonary 11/30/2020 found to have right middle lobe pneumonia mild COPD and stable obstructive sleep apnea on CPAP. He was seen by his PCP November 2021 problems include chronic pain syndrome and type 2 diabetes mellitus with neuropathy.  He underwent left heart catheterization 01/01/2019 showing the presence of occlusion of the right coronary artery moderate disease in the proximal left circumflex distal circumflex and  very small caliber first diagonal branch and advised medical therapy.  Coronary Diagrams   Diagnostic Dominance: Right    Past Medical History:  Diagnosis Date   BMI 30.0-30.9,adult    BPH without obstruction/lower urinary tract symptoms 10/03/2017   CAD (coronary artery disease)    cath 2020, single vessel disease, medical mgt   Chronic left shoulder pain c   Chronic low back pain with right-sided sciatica    Chronic pain 10/07/2013   Combined hyperlipidemia associated with  type 2 diabetes mellitus ([place]) 07/09/2019   Controlled type 2 diabetes mellitus without complication, without long-term current use of insulin ([place]) 07/12/2014   COPD (chronic obstructive pulmonary disease) ([place]) 01/07/2014   PFTs 07/2018 minimal airway obstruction, overinflation and no response to bronchodilators.    Coronary artery disease involving native coronary artery of native heart without angina pectoris    Cardiac Cath 01/2019: Severe single-vessel disease with additional moderate two-vessel disease: 100% CTO of RCA, moderate disease in proximal LCx, distal LCx and very small caliber 1st Diag DFR-FFR negative lesion in proximal LCx Normal LV pressures with previously document, ed normal EF.   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   Hypogonadism - pineal glad abnormalities On testosterone replacement   Obesity (BMI 30-39.9)    Obesity (BMI 30.0-34.9) 10/03/2017   OSA on CPAP 10/07/2013   Primary osteoarthritis involving multiple joints 01/10/2015   Rosacea    Smoking greater than 30 pack years - quit 03/2018 10/03/2017   Thoracic and lumbosacral neuritis    Type 2 diabetes mellitus with peripheral neuropathy ([place]) 07/12/2014    Past Surgical History:  Procedure Laterality Date   ARTHROSCOPIC REPAIR ACL     CARDIAC CATHETERIZATION     INTRAVASCULAR PRESSURE WIRE/FFR STUDY N/A 01/01/2019   Procedure: INTRAVASCULAR PRESSURE WIRE/FFR STUDY;  Surgeon: Leonie Man, MD;  Location: [facility];  Service: Cardiovascular;  Laterality: N/A;   LEFT HEART CATH AND  CORONARY ANGIOGRAPHY N/A 01/01/2019   Procedure: LEFT HEART CATH AND CORONARY ANGIOGRAPHY;  Surgeon: Leonie Man, MD;  Location: [facility];  Service: Cardiovascular;  Laterality: N/A;   PROSTATE CRYOABLATION     03/2019   SHOULDER ARTHROSCOPY     SPINE SURGERY      Current Medications: Current Meds  Medication Sig   acetaminophen-codeine (TYLENOL #3) 300-30 MG tablet TAKE ONE TABLET BY MOUTH EVERY SIX HOURS AS NEEDED FOR MODERATE PAIN   acetic acid 2 % otic solution Place 4 drops into both ears 2 (two) times daily as needed.   albuterol (VENTOLIN HFA) 108 (90 Base) MCG/ACT inhaler Inhale 2 puffs by mouth into the lungs every 4 hours as needed for wheezing   amLODipine (NORVASC) 10 MG tablet TAKE ONE TABLET BY MOUTH ONE TIME DAILY   aspirin 81 MG EC tablet Take 81 mg by mouth daily.    atorvastatin (LIPITOR) 20 MG tablet Take 1 tablet (20 mg total) by mouth at bedtime.   azithromycin (ZITHROMAX) 250 MG tablet Take as directed   ciprofloxacin-dexamethasone ([place]) OTIC  suspension Place 4 drops into both ears 2 (two) times daily.   cyclobenzaprine (FLEXERIL) 10 MG tablet Take 1 tablet (10 mg total) by mouth 3 (three) times daily as needed for muscle spasms.   diclofenac sodium (VOLTAREN) 1 % GEL Apply 2 g topically 4 (four) times daily as needed (pain).   FARXIGA 10 MG TABS tablet TAKE ONE TABLET BY MOUTH ONE TIME DAILY   fluticasone (FLONASE) 50 MCG/ACT nasal spray Place 1 spray into the nose daily as needed for allergies.    gabapentin (NEURONTIN) 300 MG capsule Take 1 capsule (300 mg total) by mouth 2 (two) times daily.   hydrochlorothiazide (HYDRODIURIL) 25 MG tablet Take 25 mg by mouth daily.   Ketotifen Fumarate (ALLERGY EYE DROPS OP) Place 1 drop into both eyes daily.   Loratadine 10 MG CAPS Take 10 mg by mouth daily as needed (allergies).    losartan-hydrochlorothiazide (HYZAAR) 100-25 MG tablet TAKE ONE TABLET BY MOUTH ONE TIME DAILY   meloxicam (MOBIC) 15 MG tablet  Take 15 mg by mouth daily.   metFORMIN (GLUCOPHAGE) 500 MG tablet TAKE ONE TABLET BY MOUTH TWICE A DAY WITH MEALS   metoprolol tartrate (LOPRESSOR) 50 MG tablet Take 1 tablet (50 mg total) by mouth 2 (two) times daily.   Multiple Vitamin (MULTIVITAMIN) capsule Take 1 capsule by mouth daily.    nitroGLYCERIN (NITROSTAT) 0.4 MG SL tablet Place 1 tablet (0.4 mg total) under the tongue every 5 (five) minutes as needed for chest pain.   predniSONE (DELTASONE) 20 MG tablet Take 40mg  for 5 days   Spacer/Aero-Holding Chambers (AEROCHAMBER MV) inhaler Use as instructed   tamsulosin (FLOMAX) 0.4 MG CAPS capsule TAKE TWO CAPSULES BY MOUTH DAILY   testosterone cypionate (DEPOTESTOSTERONE CYPIONATE) 200 MG/ML injection INJECT 1ML INTO THE MUSCLE EVERY 14 DAYS   Tiotropium Bromide-Olodaterol (STIOLTO RESPIMAT) 2.5-2.5 MCG/ACT AERS Inhale 2 puffs into the lungs daily.   triamcinolone cream (KENALOG) 0.1 % Apply 1 application topically 2 (two) times daily. For 2 weeks, then as needed     Allergies:   Rubus fruticosus   Social History   Socioeconomic History   Marital status: Married    Spouse name: Not on file   Number of children: Not on file   Years of education: Not on file   Highest education level: Not on file  Occupational History   Occupation: retired  Tobacco Use   Smoking status: Former    Packs/day: 0.50    Years: 30.00    Pack years: 15.00    Types: Cigarettes    Quit date: 03/05/2018    Years since quitting: 2.7   Smokeless tobacco: Never   Tobacco comments:    no cigarettes since 03/05/18  Vaping Use   Vaping Use: Never used  Substance and Sexual Activity   Alcohol use: Not Currently   Drug use: Not Currently   Sexual activity: Not Currently  Other Topics Concern   Not on file  Social History Narrative   Not on file   Social Determinants of Health   Financial Resource Strain: Low Risk    Difficulty of Paying Living Expenses: Not hard at all  Food Insecurity: No Food  Insecurity   Worried About Charity fundraiser in the Last Year: Never true   Cable in the Last Year: Never true  Transportation Needs: No Transportation Needs   Lack of Transportation (Medical): No   Lack of Transportation (Non-Medical): No  Physical Activity: Insufficiently Active  Days of Exercise per Week: 7 days   Minutes of Exercise per Session: 20 min  Stress: No Stress Concern Present   Feeling of Stress : Not at all  Social Connections: Moderately Isolated   Frequency of Communication with Friends and Family: More than three times a week   Frequency of Social Gatherings with Friends and Family: Once a week   Attends Religious Services: Never   Marine scientist or Organizations: No   Attends Music therapist: Never   Marital Status: Married     Family History: The patient's family history includes Heart disease in his father and maternal uncle; Lung cancer in his father and sister; Pulmonary embolism in his mother. ROS:   Please see the history of present illness.    All other systems reviewed and are negative.  EKGs/Labs/Other Studies Reviewed:    The following studies were reviewed today:  EKG:  EKG ordered today and personally reviewed.  The ekg ordered today demonstrates sinus rhythm first-degree AV block right bundle branch block left posterior fascicular block no acute ischemic changes unchanged October 2021  Recent Labs: 04/11/2020: ALT 21; BUN 14; Creat 1.02; Hemoglobin 17.1; Platelets 257; Potassium 4.9; Sodium 136; TSH 1.68  Recent Lipid Panel    Component Value Date/Time   CHOL 114 04/11/2020 0824   TRIG 103 04/11/2020 0824   HDL 36 (L) 04/11/2020 0824   CHOLHDL 3.2 04/11/2020 0824   VLDL 21.2 04/08/2019 0851   LDLCALC 59 04/11/2020 0824    Physical Exam:    VS:  BP 126/70 (BP Location: Left Arm, Patient Position: Sitting)   Pulse 70   Ht 6\' 2"  (1.88 m)   Wt 243 lb (110.2 kg)   SpO2 95%   BMI 31.20 kg/m     Wt  Readings from Last 3 Encounters:  12/14/20 243 lb (110.2 kg)  11/30/20 242 lb 6.4 oz (110 kg)  11/20/20 244 lb 3.2 oz (110.8 kg)     GEN:  Well nourished, well developed in no acute distress HEENT: Normal NECK: No JVD; No carotid bruits LYMPHATICS: No lymphadenopathy CARDIAC: He has no chest wall tenderness RRR, no murmurs, rubs, gallops RESPIRATORY:  Clear to auscultation without rales, wheezing or rhonchi  ABDOMEN: Soft, non-tender, non-distended MUSCULOSKELETAL:  No edema; No deformity examination left groin no obvious abnormality but he is tender in the area of the femoral vein and in the proximal medial thigh to palpation SKIN: Warm and dry NEUROLOGIC:  Alert and oriented x 3 PSYCHIATRIC:  Normal affect    Signed, Shirlee More, MD  12/14/2020 11:11 AM    [place]

## 2020-12-14 NOTE — Patient Instructions (Signed)
Medication Instructions:  No medication changes. *If you need a refill on your cardiac medications before your next appointment, please call your pharmacy*   Lab Work: None ordered If you have labs (blood work) drawn today and your tests are completely normal, you will receive your results only by: [place] (if you have MyChart) OR A paper copy in the mail If you have any lab test that is abnormal or we need to change your treatment, we will call you to review the results.   Testing/Procedures: DVT study   Follow-Up: At [facility], you and your health needs are our priority.  As part of our continuing mission to provide you with exceptional heart care, we have created designated Provider Care Teams.  These Care Teams include your primary Cardiologist (physician) and Advanced Practice Providers (APPs -  Physician Assistants and Nurse Practitioners) who all work together to provide you with the care you need, when you need it.  We recommend signing up for the patient portal called "MyChart".  Sign up information is provided on this After Visit Summary.  MyChart is used to connect with patients for Virtual Visits (Telemedicine).  Patients are able to view lab/test results, encounter notes, upcoming appointments, etc.  Non-urgent messages can be sent to your provider as well.   To learn more about what you can do with MyChart, go to [email or web address].    Your next appointment:   6 month(s)  The format for your next appointment:   In Person  Provider:   Shirlee More, MD   Other Instructions NA

## 2020-12-15 ENCOUNTER — Telehealth: Payer: Self-pay | Admitting: Cardiology

## 2020-12-15 LAB — D-DIMER, QUANTITATIVE: D-DIMER: 0.26 mg/L FEU (ref 0.00–0.49)

## 2020-12-15 NOTE — Telephone Encounter (Signed)
Spoke with patient, see chart.    

## 2020-12-15 NOTE — Telephone Encounter (Signed)
Pt is returning a call about results

## 2020-12-16 ENCOUNTER — Other Ambulatory Visit: Payer: Self-pay | Admitting: Family Medicine

## 2020-12-26 DIAGNOSIS — Z20822 Contact with and (suspected) exposure to covid-19: Secondary | ICD-10-CM | POA: Diagnosis not present

## 2021-01-01 DIAGNOSIS — Z20822 Contact with and (suspected) exposure to covid-19: Secondary | ICD-10-CM | POA: Diagnosis not present

## 2021-01-02 ENCOUNTER — Other Ambulatory Visit: Payer: Self-pay

## 2021-01-02 ENCOUNTER — Ambulatory Visit (INDEPENDENT_AMBULATORY_CARE_PROVIDER_SITE_OTHER): Payer: Medicare Other

## 2021-01-02 DIAGNOSIS — M79605 Pain in left leg: Secondary | ICD-10-CM | POA: Diagnosis not present

## 2021-01-03 DIAGNOSIS — M47816 Spondylosis without myelopathy or radiculopathy, lumbar region: Secondary | ICD-10-CM | POA: Diagnosis not present

## 2021-01-08 ENCOUNTER — Telehealth: Payer: Self-pay

## 2021-01-08 NOTE — Telephone Encounter (Signed)
-----   Message from Richardo Priest, MD sent at 01/08/2021 11:40 AM EDT ----- Normal or stable result  No DVT seen on duplex

## 2021-01-08 NOTE — Telephone Encounter (Signed)
Spoke with patient regarding results and recommendation.  Patient verbalizes understanding and is agreeable to plan of care. Advised patient to call back with any issues or concerns.  

## 2021-01-10 DIAGNOSIS — R948 Abnormal results of function studies of other organs and systems: Secondary | ICD-10-CM | POA: Diagnosis not present

## 2021-01-10 DIAGNOSIS — E291 Testicular hypofunction: Secondary | ICD-10-CM | POA: Diagnosis not present

## 2021-01-11 ENCOUNTER — Ambulatory Visit (INDEPENDENT_AMBULATORY_CARE_PROVIDER_SITE_OTHER): Payer: Medicare Other | Admitting: Family Medicine

## 2021-01-11 ENCOUNTER — Encounter: Payer: Self-pay | Admitting: Family Medicine

## 2021-01-11 ENCOUNTER — Other Ambulatory Visit: Payer: Self-pay

## 2021-01-11 VITALS — BP 123/77 | HR 74 | Temp 98.4°F | Ht 74.0 in | Wt 238.6 lb

## 2021-01-11 DIAGNOSIS — R202 Paresthesia of skin: Secondary | ICD-10-CM

## 2021-01-11 DIAGNOSIS — E1142 Type 2 diabetes mellitus with diabetic polyneuropathy: Secondary | ICD-10-CM

## 2021-01-11 DIAGNOSIS — H9192 Unspecified hearing loss, left ear: Secondary | ICD-10-CM | POA: Diagnosis not present

## 2021-01-11 DIAGNOSIS — G894 Chronic pain syndrome: Secondary | ICD-10-CM | POA: Diagnosis not present

## 2021-01-11 DIAGNOSIS — I10 Essential (primary) hypertension: Secondary | ICD-10-CM | POA: Diagnosis not present

## 2021-01-11 DIAGNOSIS — H60543 Acute eczematoid otitis externa, bilateral: Secondary | ICD-10-CM

## 2021-01-11 LAB — POCT GLYCOSYLATED HEMOGLOBIN (HGB A1C): Hemoglobin A1C: 6.9 % — AB (ref 4.0–5.6)

## 2021-01-11 MED ORDER — METFORMIN HCL 500 MG PO TABS: 500.0000 mg | ORAL_TABLET | Freq: Two times a day (BID) | ORAL | 0 refills | Status: AC

## 2021-01-11 MED ORDER — ACETAMINOPHEN-CODEINE #3 300-30 MG PO TABS: 1.0000 | ORAL_TABLET | Freq: Two times a day (BID) | ORAL | 0 refills | Status: DC | PRN

## 2021-01-11 MED ORDER — GABAPENTIN 300 MG PO CAPS: 300.0000 mg | ORAL_CAPSULE | Freq: Three times a day (TID) | ORAL | 3 refills | Status: AC

## 2021-01-11 NOTE — Patient Instructions (Addendum)
Please return in 3 months for your annual complete physical; please come fasting.   I have placed referrals to neurology and ENT. I want nerve studies on the legs and have ordered that test as well.   Please increase the gabapentin to '300mg'$  3x/day. We can increase up from here if needed: can increase up to '600mg'$  3x/day. Will increase slowly.   Your diabetes level is at 6.9; watch your diet. We will recheck in 3 months and increase the metformin dose at that time if it continues to rise.   If you have any questions or concerns, please don't hesitate to send me a message via MyChart or call the office at [phone]. Thank you for visiting with [place] today! It's our pleasure caring for you.

## 2021-01-11 NOTE — Progress Notes (Signed)
Subjective  CC:  Chief Complaint  Patient presents with   Diabetes    Taking medication as prescribed     HPI: Jason Snyder is a 70 y.o. male who presents to the office today for follow up of diabetes and problems listed above in the chief complaint.  Diabetes follow up: His diabetic control is reported as Unchanged. No clinical sxs of hyperglycemia. On metfromin and farxiga He denies exertional CP or SOB or symptomatic hypoglycemia. He denies foot sores however c/o worsening numbness and pain in both feet. See next Paresthesias: had mild neuropathy sxs and started on bid gabapentin 300 6-9 months ago. This had been doing well for him until 1-2 months ago: now c/o pain in inner thigh and numbness and tingling in feet. Numbness is so bad that he can't feel the gas and brake pedals! He has known chronic back problems managed by pain mgt;has had lumbar MRI this year. Also had venous dopplers by cardiology. No skin changes. No trauma. No bowel or bladder dysfunction Chronic pain: overall remains improved. Now needed tylenol # 3 only nightly (down from tid).  HTN remains well controlled. Feeling well. Taking medications w/o adverse effects. No symptoms of CHF, angina; no palpitations, sob, cp or lower extremity edema. Compliant with meds.  C/o can't hear out of left ear. Feels clogged. Has chronic eczematous changes. No pain. Hasn't improved with cortisporin otic drops. No fevers.  Of note, recovered from PNA; reviewed notes/ mgt from pulmonolgy.   Wt Readings from Last 3 Encounters:  01/11/21 238 lb 9.6 oz (108.2 kg)  12/14/20 243 lb (110.2 kg)  11/30/20 242 lb 6.4 oz (110 kg)    BP Readings from Last 3 Encounters:  01/11/21 123/77  12/14/20 126/70  11/30/20 122/78    Assessment  1. Type 2 diabetes mellitus with peripheral neuropathy (HCC)   2. Essential hypertension   3. Chronic pain syndrome   4. Diabetic polyneuropathy associated with type 2 diabetes mellitus (HCC)   5. Eczema of  both external ears   6. Hearing loss of left ear, unspecified hearing loss type   7. Paresthesia of foot, bilateral      Plan  Diabetes is currently well controlled. Continue farxiga and met at doses listed belwo. Monitor diet. Recheck 3 months. Can increase dose of metformin at that time if a1c trending upwards.  Paresthesias and LE numbness: ? Rapid progression of diabetic neuropthy, amyotrophic neuorpathy, lumbar radiculopathy, combination or other. Rec emg/ncv and refer to neuropathy. Increase gabapentin to 300 tid and can titrate up from there if needed.  Chronic pain improved. Per pain mgt and tylenol #3. No refill needed at this time due to using less.  Eczema and hearing loss: refer to ent for eval/ hearing screen and treatment. Failed conservative measures  I spent a total of 44 minutes for this patient encounter. Time spent included preparation, face-to-face counseling with the patient and coordination of care, review of chart and records, and documentation of the encounter.   Follow up: 3 mo for diabetes recheck. And cpe.   acetaminophen-codeine 300-30 MG tablet Commonly known as: TYLENOL #3 Take 1 tablet by mouth 2 (two) times daily as needed for moderate pain. What changed: See the new instructions. Changed by: Willow Ora, MD   acetic acid 2 % otic solution Place 4 drops into both ears 2 (two) times daily as needed.   AeroChamber MV inhaler Use as instructed   albuterol 108 (90 Base) MCG/ACT inhaler Commonly known  as: Ventolin HFA Inhale 2 puffs by mouth into the lungs every 4 hours as needed for wheezing   ALLERGY EYE DROPS OP Place 1 drop into both eyes daily.   amLODipine 10 MG tablet Commonly known as: NORVASC TAKE ONE TABLET BY MOUTH ONE TIME DAILY   aspirin 81 MG EC tablet Take 81 mg by mouth daily.   atorvastatin 20 MG tablet Commonly known as: LIPITOR Take 1 tablet (20 mg total) by mouth at bedtime.   ciprofloxacin-dexamethasone OTIC  suspension Commonly known as: CIPRODEX Place 4 drops into both ears 2 (two) times daily.   cyclobenzaprine 10 MG tablet Commonly known as: FLEXERIL Take 1 tablet (10 mg total) by mouth 3 (three) times daily as needed for muscle spasms.   diclofenac sodium 1 % Gel Commonly known as: VOLTAREN Apply 2 g topically 4 (four) times daily as needed (pain).   Farxiga 10 MG Tabs tablet Generic drug: dapagliflozin propanediol TAKE ONE TABLET BY MOUTH ONE TIME DAILY   fluticasone 50 MCG/ACT nasal spray Commonly known as: FLONASE Place 1 spray into the nose daily as needed for allergies.   gabapentin 300 MG capsule Commonly known as: NEURONTIN Take 1 capsule (300 mg total) by mouth 3 (three) times daily. What changed: when to take this Changed by: [name], MD   hydrochlorothiazide 25 MG tablet Commonly known as: HYDRODIURIL Take 25 mg by mouth daily.   Loratadine 10 MG Caps Take 10 mg by mouth daily as needed (allergies).   losartan-hydrochlorothiazide 100-25 MG tablet Commonly known as: HYZAAR TAKE ONE TABLET BY MOUTH ONE TIME DAILY   metFORMIN 500 MG tablet Commonly known as: GLUCOPHAGE Take 1 tablet (500 mg total) by mouth 2 (two) times daily with a meal.   metoprolol tartrate 50 MG tablet Commonly known as: LOPRESSOR Take 1 tablet (50 mg total) by mouth 2 (two) times daily.   multivitamin capsule Take 1 capsule by mouth daily.   nitroGLYCERIN 0.4 MG SL tablet Commonly known as: NITROSTAT Place 1 tablet (0.4 mg total) under the tongue every 5 (five) minutes as needed for chest pain.   Stiolto Respimat 2.5-2.5 MCG/ACT Aers Generic drug: Tiotropium Bromide-Olodaterol Inhale 2 puffs into the lungs daily.   tamsulosin 0.4 MG Caps capsule Commonly known as: FLOMAX TAKE TWO CAPSULES BY MOUTH DAILY   testosterone cypionate 200 MG/ML injection Commonly known as: DEPOTESTOSTERONE CYPIONATE INJECT 1ML INTO THE MUSCLE EVERY 14 DAYS   triamcinolone cream 0.1  % Commonly known as: KENALOG Apply 1 application topically 2 (two) times daily. For 2 weeks, then as needed          Orders Placed This Encounter  Procedures   Ambulatory referral to Neurology   Ambulatory referral to ENT   POCT glycosylated hemoglobin (Hb A1C)   NCV with EMG(electromyography)   Meds ordered this encounter  Medications   metFORMIN (GLUCOPHAGE) 500 MG tablet    Sig: Take 1 tablet (500 mg total) by mouth 2 (two) times daily with a meal.    Dispense:  180 tablet    Refill:  0   acetaminophen-codeine (TYLENOL #3) 300-30 MG tablet    Sig: Take 1 tablet by mouth 2 (two) times daily as needed for moderate pain.    Dispense:  90 tablet    Refill:  0   gabapentin (NEURONTIN) 300 MG capsule    Sig: Take 1 capsule (300 mg total) by mouth 3 (three) times daily.    Dispense:  180 capsule  Refill:  3      Immunization History  Administered Date(s) Administered   Fluad Quad(high Dose 65+) 02/02/2019, 02/29/2020   Hepatitis B, ped/adol 06/04/2003   Influenza Split 03/03/2010   Influenza, High Dose Seasonal PF 03/21/2017, 03/03/2018   Influenza, Seasonal, Injecte, Preservative Fre 03/23/2014, 04/12/2015   Influenza,inj,Quad PF,6+ Mos 04/01/2016   PFIZER(Purple Top)SARS-COV-2 Vaccination 07/30/2019, 08/24/2019, 03/18/2020   Pneumococcal Conjugate-13 06/06/2018   Pneumococcal Polysaccharide-23 06/04/2003, 07/09/2019   Td 06/04/2003   Tdap 01/10/2015   Zoster, Live 09/18/2015    Diabetes Related Lab Review: Lab Results  Component Value Date   HGBA1C 6.9 (A) 01/11/2021   HGBA1C 6.7 (A) 07/12/2020   HGBA1C 6.2 (A) 04/11/2020    Lab Results  Component Value Date   MICROALBUR 11.1 (H) 04/08/2019   Lab Results  Component Value Date   CREATININE 1.02 04/11/2020   BUN 14 04/11/2020   NA 136 04/11/2020   K 4.9 04/11/2020   CL 98 04/11/2020   CO2 31 04/11/2020   Lab Results  Component Value Date   CHOL 114 04/11/2020   CHOL 138 04/08/2019   CHOL 149  04/06/2018   Lab Results  Component Value Date   HDL 36 (L) 04/11/2020   HDL 38.60 (L) 04/08/2019   HDL 42.20 04/06/2018   Lab Results  Component Value Date   LDLCALC 59 04/11/2020   LDLCALC 78 04/08/2019   LDLCALC 80 04/06/2018   Lab Results  Component Value Date   TRIG 103 04/11/2020   TRIG 106.0 04/08/2019   TRIG 137.0 04/06/2018   Lab Results  Component Value Date   CHOLHDL 3.2 04/11/2020   CHOLHDL 4 04/08/2019   CHOLHDL 4 04/06/2018   No results found for: LDLDIRECT The ASCVD Risk score (Goff DC Jr., et al., 2013) failed to calculate for the following reasons:   The valid total cholesterol range is 130 to 320 mg/dL I have reviewed the PMH, Fam and Soc history. Patient Active Problem List   Diagnosis Date Noted   Combined hyperlipidemia associated with type 2 diabetes mellitus (HCC) 07/09/2019    Priority: High   Coronary artery disease involving native coronary artery of native heart without angina pectoris     Priority: High    Cardiac Cath 01/2019: Severe single-vessel disease with additional moderate two-vessel disease: 100% CTO of RCA, moderate disease in proximal LCx, distal LCx and very small caliber 1st Diag DFR-FFR negative lesion in proximal LCx Normal LV pressures with previously document, ed normal EF.    Encounter for screening for lung cancer 11/09/2018    Priority: High    10/2018: Chest CT: neg lung cancer: + COPD changes and cardiovascular calcifications    Smoking greater than 30 pack years - quit 03/2018 10/03/2017    Priority: High   Type 2 diabetes mellitus with peripheral neuropathy (HCC) 07/12/2014    Priority: High   COPD (chronic obstructive pulmonary disease) (HCC) 01/07/2014    Priority: High    PFTs 07/2018 minimal airway obstruction, overinflation and no response to bronchodilators.     Chronic pain 10/07/2013    Priority: High   OSA on CPAP 10/07/2013    Priority: High   Essential hypertension 08/31/2010    Priority: High     Elevated ASCVD risk - started lipitor 04/2013    BPH without obstruction/lower urinary tract symptoms 10/03/2017    Priority: Medium   Gastroesophageal reflux disease 01/10/2015    Priority: Medium   Primary osteoarthritis involving multiple joints 01/10/2015  Priority: Medium   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    Priority: Medium    Hypogonadism - pineal glad abnormalities On testosterone replacement    Rosacea 10/03/2017    Priority: Low   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    Priority: Low   Thoracic and lumbosacral neuritis    Obesity (BMI 30-39.9)    Hypertension    Hyperlipidemia    History of lumbar surgery    Diabetes mellitus without complication ([place])    DDD (degenerative disc disease), lumbar    Chronic low back pain with right-sided sciatica    Chronic left shoulder pain    CAD (coronary artery disease)     cath 2020, single vessel disease, medical mgt    BMI 30.0-30.9,adult    Controlled type 2 diabetes mellitus without complication, without long-term current use of insulin ([place]) 07/12/2014    Social History: Patient  reports that he quit smoking about 2 years ago. His smoking use included cigarettes. He has a 15.00 pack-year smoking history. He has never used smokeless tobacco. He reports that he does not currently use alcohol. He reports that he does not currently use drugs.  Review of Systems: Ophthalmic: negative for eye pain, loss of vision or double vision Cardiovascular: negative for chest pain Respiratory: negative for SOB or persistent cough Gastrointestinal: negative for abdominal pain Genitourinary: negative for dysuria or gross hematuria MSK: negative for foot lesions Neurologic: negative for weakness or gait disturbance  Objective  Vitals: BP 123/77   Pulse 74   Temp 98.4 F (36.9 C) (Temporal)   Ht $R'6\' 2"'kS$  (1.88 m)   Wt 238 lb 9.6 oz (108.2 kg)   SpO2 100%   BMI 30.63 kg/m  General: well appearing, no acute distress  Cardiovascular:  Nl S1  and S2, RRR without murmur, gallop or rub. no edema Respiratory:  Good breath sounds bilaterally, CTAB with normal effort, no rales Gastrointestinal: normal BS, soft, nontender Skin:  Warm, no rashes Neurologic:   Mental status is normal. normal gait Foot exam: no erythema, pallor, or cyanosis visible nl proprioception but decreased sensation to  microfilament, +2 distal pulses bilaterally    Diabetic education: ongoing education regarding chronic disease management for diabetes was given today. We continue to reinforce the ABC's of diabetic management: A1c (<7 or 8 dependent upon patient), tight blood pressure control, and cholesterol management with goal LDL < 100 minimally. We discuss diet strategies, exercise recommendations, medication options and possible side effects. At each visit, we review recommended immunizations and preventive care recommendations for diabetics and stress that good diabetic control can prevent other problems. See below for this patient's data.   Commons side effects, risks, benefits, and alternatives for medications and treatment plan prescribed today were discussed, and the patient expressed understanding of the given instructions. Patient is instructed to call or message via MyChart if he/she has any questions or concerns regarding our treatment plan. No barriers to understanding were identified. We discussed Red Flag symptoms and signs in detail. Patient expressed understanding regarding what to do in case of urgent or emergency type symptoms.  Medication list was reconciled, printed and provided to the patient in AVS. Patient instructions and summary information was reviewed with the patient as documented in the AVS. This note was prepared with assistance of Dragon voice recognition software. Occasional wrong-word or sound-a-like substitutions may have occurred due to the inherent limitations of voice recognition software  This visit occurred during the SARS-CoV-2  public health emergency.  Safety protocols were  in place, including screening questions prior to the visit, additional usage of staff PPE, and extensive cleaning of exam room while observing appropriate contact time as indicated for disinfecting solutions.

## 2021-01-22 ENCOUNTER — Encounter (HOSPITAL_COMMUNITY): Payer: Self-pay

## 2021-01-22 ENCOUNTER — Emergency Department (HOSPITAL_COMMUNITY): Payer: Medicare Other

## 2021-01-22 ENCOUNTER — Inpatient Hospital Stay (HOSPITAL_COMMUNITY)
Admission: EM | Admit: 2021-01-22 | Discharge: 2021-01-26 | DRG: 054 | Disposition: A | Discharge status: Home / Self Care | Payer: Medicare Other | Attending: Family Medicine | Admitting: Family Medicine

## 2021-01-22 ENCOUNTER — Observation Stay (HOSPITAL_COMMUNITY): Payer: Medicare Other

## 2021-01-22 DIAGNOSIS — E669 Obesity, unspecified: Secondary | ICD-10-CM | POA: Diagnosis present

## 2021-01-22 DIAGNOSIS — G9389 Other specified disorders of brain: Secondary | ICD-10-CM | POA: Diagnosis not present

## 2021-01-22 DIAGNOSIS — G936 Cerebral edema: Secondary | ICD-10-CM | POA: Diagnosis present

## 2021-01-22 DIAGNOSIS — G8929 Other chronic pain: Secondary | ICD-10-CM | POA: Diagnosis present

## 2021-01-22 DIAGNOSIS — Z87891 Personal history of nicotine dependence: Secondary | ICD-10-CM

## 2021-01-22 DIAGNOSIS — E782 Mixed hyperlipidemia: Secondary | ICD-10-CM | POA: Diagnosis present

## 2021-01-22 DIAGNOSIS — Z20822 Contact with and (suspected) exposure to covid-19: Secondary | ICD-10-CM | POA: Diagnosis not present

## 2021-01-22 DIAGNOSIS — Z888 Allergy status to other drugs, medicaments and biological substances status: Secondary | ICD-10-CM

## 2021-01-22 DIAGNOSIS — K449 Diaphragmatic hernia without obstruction or gangrene: Secondary | ICD-10-CM | POA: Diagnosis present

## 2021-01-22 DIAGNOSIS — I10 Essential (primary) hypertension: Secondary | ICD-10-CM | POA: Diagnosis not present

## 2021-01-22 DIAGNOSIS — C221 Intrahepatic bile duct carcinoma: Secondary | ICD-10-CM

## 2021-01-22 DIAGNOSIS — D430 Neoplasm of uncertain behavior of brain, supratentorial: Secondary | ICD-10-CM | POA: Diagnosis not present

## 2021-01-22 DIAGNOSIS — R200 Anesthesia of skin: Secondary | ICD-10-CM

## 2021-01-22 DIAGNOSIS — E1169 Type 2 diabetes mellitus with other specified complication: Secondary | ICD-10-CM | POA: Diagnosis present

## 2021-01-22 DIAGNOSIS — Z79899 Other long term (current) drug therapy: Secondary | ICD-10-CM

## 2021-01-22 DIAGNOSIS — C7931 Secondary malignant neoplasm of brain: Secondary | ICD-10-CM | POA: Diagnosis not present

## 2021-01-22 DIAGNOSIS — Z6831 Body mass index (BMI) 31.0-31.9, adult: Secondary | ICD-10-CM

## 2021-01-22 DIAGNOSIS — D496 Neoplasm of unspecified behavior of brain: Secondary | ICD-10-CM | POA: Diagnosis present

## 2021-01-22 DIAGNOSIS — I700 Atherosclerosis of aorta: Secondary | ICD-10-CM | POA: Diagnosis not present

## 2021-01-22 DIAGNOSIS — Z91018 Allergy to other foods: Secondary | ICD-10-CM

## 2021-01-22 DIAGNOSIS — Z8249 Family history of ischemic heart disease and other diseases of the circulatory system: Secondary | ICD-10-CM

## 2021-01-22 DIAGNOSIS — G4733 Obstructive sleep apnea (adult) (pediatric): Secondary | ICD-10-CM | POA: Diagnosis present

## 2021-01-22 DIAGNOSIS — K209 Esophagitis, unspecified without bleeding: Secondary | ICD-10-CM

## 2021-01-22 DIAGNOSIS — K769 Liver disease, unspecified: Secondary | ICD-10-CM

## 2021-01-22 DIAGNOSIS — I2510 Atherosclerotic heart disease of native coronary artery without angina pectoris: Secondary | ICD-10-CM | POA: Diagnosis present

## 2021-01-22 DIAGNOSIS — E1142 Type 2 diabetes mellitus with diabetic polyneuropathy: Secondary | ICD-10-CM | POA: Diagnosis not present

## 2021-01-22 DIAGNOSIS — K5900 Constipation, unspecified: Secondary | ICD-10-CM | POA: Diagnosis present

## 2021-01-22 DIAGNOSIS — G935 Compression of brain: Secondary | ICD-10-CM | POA: Diagnosis present

## 2021-01-22 DIAGNOSIS — C7889 Secondary malignant neoplasm of other digestive organs: Secondary | ICD-10-CM | POA: Diagnosis present

## 2021-01-22 DIAGNOSIS — R220 Localized swelling, mass and lump, head: Secondary | ICD-10-CM | POA: Diagnosis not present

## 2021-01-22 DIAGNOSIS — K862 Cyst of pancreas: Secondary | ICD-10-CM | POA: Diagnosis not present

## 2021-01-22 DIAGNOSIS — L219 Seborrheic dermatitis, unspecified: Secondary | ICD-10-CM | POA: Diagnosis present

## 2021-01-22 DIAGNOSIS — K7689 Other specified diseases of liver: Secondary | ICD-10-CM | POA: Diagnosis not present

## 2021-01-22 DIAGNOSIS — R531 Weakness: Secondary | ICD-10-CM | POA: Diagnosis not present

## 2021-01-22 DIAGNOSIS — N400 Enlarged prostate without lower urinary tract symptoms: Secondary | ICD-10-CM | POA: Diagnosis not present

## 2021-01-22 DIAGNOSIS — K2289 Other specified disease of esophagus: Secondary | ICD-10-CM

## 2021-01-22 DIAGNOSIS — N3289 Other specified disorders of bladder: Secondary | ICD-10-CM | POA: Diagnosis not present

## 2021-01-22 DIAGNOSIS — Z7984 Long term (current) use of oral hypoglycemic drugs: Secondary | ICD-10-CM

## 2021-01-22 DIAGNOSIS — R1314 Dysphagia, pharyngoesophageal phase: Secondary | ICD-10-CM | POA: Diagnosis present

## 2021-01-22 DIAGNOSIS — R29818 Other symptoms and signs involving the nervous system: Secondary | ICD-10-CM | POA: Diagnosis not present

## 2021-01-22 DIAGNOSIS — Z7982 Long term (current) use of aspirin: Secondary | ICD-10-CM

## 2021-01-22 DIAGNOSIS — C787 Secondary malignant neoplasm of liver and intrahepatic bile duct: Secondary | ICD-10-CM | POA: Diagnosis present

## 2021-01-22 DIAGNOSIS — R1319 Other dysphagia: Secondary | ICD-10-CM

## 2021-01-22 DIAGNOSIS — J449 Chronic obstructive pulmonary disease, unspecified: Secondary | ICD-10-CM | POA: Diagnosis present

## 2021-01-22 DIAGNOSIS — K2100 Gastro-esophageal reflux disease with esophagitis, without bleeding: Secondary | ICD-10-CM | POA: Diagnosis present

## 2021-01-22 LAB — COMPREHENSIVE METABOLIC PANEL
ALT: 26 U/L (ref 0–44)
AST: 23 U/L (ref 15–41)
Albumin: 4.4 g/dL (ref 3.5–5.0)
Alkaline Phosphatase: 58 U/L (ref 38–126)
Anion gap: 13 (ref 5–15)
BUN: 16 mg/dL (ref 8–23)
CO2: 26 mmol/L (ref 22–32)
Calcium: 9.6 mg/dL (ref 8.9–10.3)
Chloride: 97 mmol/L — ABNORMAL LOW (ref 98–111)
Creatinine, Ser: 1.09 mg/dL (ref 0.61–1.24)
GFR, Estimated: >60 mL/min (ref >60)
Glucose, Bld: 126 mg/dL — ABNORMAL HIGH (ref 70–99)
Potassium: 4 mmol/L (ref 3.5–5.1)
Sodium: 136 mmol/L (ref 135–145)
Total Bilirubin: 1.3 mg/dL — ABNORMAL HIGH (ref 0.3–1.2)
Total Protein: 7.3 g/dL (ref 6.5–8.1)

## 2021-01-22 LAB — PROTIME-INR
INR: 1.1 (ref 0.8–1.2)
Prothrombin Time: 13.7 seconds (ref 11.4–15.2)

## 2021-01-22 LAB — DIFFERENTIAL
Abs Immature Granulocytes: 0.04 10*3/uL (ref 0.00–0.07)
Basophils Absolute: 0 10*3/uL (ref 0.0–0.1)
Basophils Relative: 1 %
Eosinophils Absolute: 0.1 10*3/uL (ref 0.0–0.5)
Eosinophils Relative: 2 %
Immature Granulocytes: 1 %
Lymphocytes Relative: 21 %
Lymphs Abs: 1.6 10*3/uL (ref 0.7–4.0)
Monocytes Absolute: 0.6 10*3/uL (ref 0.1–1.0)
Monocytes Relative: 8 %
Neutro Abs: 5.1 10*3/uL (ref 1.7–7.7)
Neutrophils Relative %: 67 %

## 2021-01-22 LAB — CBC
HCT: 50.8 % (ref 39.0–52.0)
Hemoglobin: 17 g/dL (ref 13.0–17.0)
MCH: 31.6 pg (ref 26.0–34.0)
MCHC: 33.5 g/dL (ref 30.0–36.0)
MCV: 94.4 fL (ref 80.0–100.0)
Platelets: 251 10*3/uL (ref 150–400)
RBC: 5.38 MIL/uL (ref 4.22–5.81)
RDW: 15.1 % (ref 11.5–15.5)
WBC: 7.5 10*3/uL (ref 4.0–10.5)
nRBC: 0 % (ref 0.0–0.2)

## 2021-01-22 LAB — RESP PANEL BY RT-PCR (FLU A&B, COVID) ARPGX2
Influenza A by PCR: NEGATIVE
Influenza B by PCR: NEGATIVE
SARS Coronavirus 2 by RT PCR: NEGATIVE

## 2021-01-22 LAB — APTT: aPTT: 32 seconds (ref 24–36)

## 2021-01-22 LAB — HIV ANTIBODY (ROUTINE TESTING W REFLEX): HIV Screen 4th Generation wRfx: NONREACTIVE

## 2021-01-22 LAB — CBG MONITORING, ED: Glucose-Capillary: 116 mg/dL — ABNORMAL HIGH (ref 70–99)

## 2021-01-22 MED ORDER — GADOBUTROL 1 MMOL/ML IV SOLN
10.0000 mL | Freq: Once | INTRAVENOUS | Status: AC | PRN
  Administered 2021-01-22: 10 mL via INTRAVENOUS

## 2021-01-22 MED ORDER — TAMSULOSIN HCL 0.4 MG PO CAPS
0.8000 mg | ORAL_CAPSULE | Freq: Every day | ORAL | Status: DC
  Administered 2021-01-23 – 2021-01-26 (×4): 0.8 mg via ORAL
  Filled 2021-01-22 (×4): qty 2

## 2021-01-22 MED ORDER — POLYETHYLENE GLYCOL 3350 17 G PO PACK: 17.0000 g | PACK | Freq: Every day | ORAL | Status: DC | PRN

## 2021-01-22 MED ORDER — DIPHENHYDRAMINE HCL 25 MG PO CAPS
50.0000 mg | ORAL_CAPSULE | Freq: Once | ORAL | Status: AC
  Administered 2021-01-22: 50 mg via ORAL
  Filled 2021-01-22: qty 2

## 2021-01-22 MED ORDER — IOHEXOL 300 MG/ML  SOLN
100.0000 mL | Freq: Once | INTRAMUSCULAR | Status: AC | PRN
  Administered 2021-01-22: 100 mL via INTRAVENOUS

## 2021-01-22 MED ORDER — ENOXAPARIN SODIUM 40 MG/0.4ML IJ SOSY: 40.0000 mg | PREFILLED_SYRINGE | INTRAMUSCULAR | Status: DC

## 2021-01-22 MED ORDER — GABAPENTIN 300 MG PO CAPS
300.0000 mg | ORAL_CAPSULE | Freq: Three times a day (TID) | ORAL | Status: DC
  Administered 2021-01-22 – 2021-01-26 (×12): 300 mg via ORAL
  Filled 2021-01-22 (×12): qty 1

## 2021-01-22 MED ORDER — SODIUM CHLORIDE 0.9% FLUSH
3.0000 mL | Freq: Once | INTRAVENOUS | Status: AC
  Administered 2021-01-22: 3 mL via INTRAVENOUS

## 2021-01-22 MED ORDER — NITROGLYCERIN 0.4 MG SL SUBL: 0.4000 mg | SUBLINGUAL_TABLET | SUBLINGUAL | Status: DC | PRN

## 2021-01-22 MED ORDER — DAPAGLIFLOZIN PROPANEDIOL 10 MG PO TABS
10.0000 mg | ORAL_TABLET | Freq: Every day | ORAL | Status: DC
  Administered 2021-01-23 – 2021-01-26 (×4): 10 mg via ORAL
  Filled 2021-01-22 (×4): qty 1

## 2021-01-22 MED ORDER — INSULIN ASPART 100 UNIT/ML IJ SOLN
0.0000 [IU] | Freq: Three times a day (TID) | INTRAMUSCULAR | Status: DC
  Administered 2021-01-23 – 2021-01-24 (×4): 2 [IU] via SUBCUTANEOUS
  Administered 2021-01-24: 1 [IU] via SUBCUTANEOUS
  Administered 2021-01-24: 2 [IU] via SUBCUTANEOUS
  Administered 2021-01-25: 1 [IU] via SUBCUTANEOUS

## 2021-01-22 MED ORDER — ACETIC ACID 2 % OT SOLN: 4.0000 [drp] | Freq: Two times a day (BID) | OTIC | Status: DC | PRN

## 2021-01-22 MED ORDER — ATORVASTATIN CALCIUM 10 MG PO TABS
20.0000 mg | ORAL_TABLET | Freq: Every day | ORAL | Status: DC
  Administered 2021-01-22 – 2021-01-25 (×4): 20 mg via ORAL
  Filled 2021-01-22 (×4): qty 2

## 2021-01-22 MED ORDER — ARFORMOTEROL TARTRATE 15 MCG/2ML IN NEBU
15.0000 ug | INHALATION_SOLUTION | Freq: Two times a day (BID) | RESPIRATORY_TRACT | Status: DC
Start: 2021-01-22 — End: 2021-01-26
  Administered 2021-01-22 – 2021-01-26 (×8): 15 ug via RESPIRATORY_TRACT
  Filled 2021-01-22 (×10): qty 2

## 2021-01-22 MED ORDER — DAPAGLIFLOZIN PROPANEDIOL 10 MG PO TABS: 10.0000 mg | ORAL_TABLET | Freq: Every day | ORAL | Status: DC

## 2021-01-22 MED ORDER — ACETAMINOPHEN 325 MG PO TABS
650.0000 mg | ORAL_TABLET | Freq: Four times a day (QID) | ORAL | Status: DC | PRN
  Administered 2021-01-23 – 2021-01-26 (×7): 650 mg via ORAL
  Filled 2021-01-22 (×7): qty 2

## 2021-01-22 MED ORDER — FLUTICASONE PROPIONATE 50 MCG/ACT NA SUSP: 1.0000 | Freq: Every day | NASAL | Status: DC | PRN

## 2021-01-22 MED ORDER — DEXAMETHASONE 4 MG PO TABS: 10.0000 mg | ORAL_TABLET | Freq: Four times a day (QID) | ORAL | Status: DC

## 2021-01-22 MED ORDER — CIPROFLOXACIN-DEXAMETHASONE 0.3-0.1 % OT SUSP
4.0000 [drp] | Freq: Two times a day (BID) | OTIC | Status: DC
  Administered 2021-01-22 – 2021-01-25 (×6): 4 [drp] via OTIC
  Filled 2021-01-22 (×2): qty 7.5

## 2021-01-22 MED ORDER — ACETAMINOPHEN 650 MG RE SUPP: 650.0000 mg | Freq: Four times a day (QID) | RECTAL | Status: DC | PRN

## 2021-01-22 MED ORDER — UMECLIDINIUM BROMIDE 62.5 MCG/INH IN AEPB
1.0000 | INHALATION_SPRAY | Freq: Every day | RESPIRATORY_TRACT | Status: DC
  Administered 2021-01-22 – 2021-01-26 (×4): 1 via RESPIRATORY_TRACT
  Filled 2021-01-22: qty 7

## 2021-01-22 MED ORDER — ALBUTEROL SULFATE HFA 108 (90 BASE) MCG/ACT IN AERS
2.0000 | INHALATION_SPRAY | Freq: Four times a day (QID) | RESPIRATORY_TRACT | Status: DC | PRN
  Filled 2021-01-22: qty 6.7

## 2021-01-22 MED ORDER — METFORMIN HCL 500 MG PO TABS
500.0000 mg | ORAL_TABLET | Freq: Two times a day (BID) | ORAL | Status: DC
  Administered 2021-01-22 – 2021-01-26 (×7): 500 mg via ORAL
  Filled 2021-01-22 (×8): qty 1

## 2021-01-22 MED ORDER — METOPROLOL TARTRATE 50 MG PO TABS
50.0000 mg | ORAL_TABLET | Freq: Two times a day (BID) | ORAL | Status: DC
  Administered 2021-01-22 – 2021-01-26 (×8): 50 mg via ORAL
  Filled 2021-01-22 (×8): qty 1

## 2021-01-22 MED ORDER — DEXAMETHASONE 4 MG PO TABS
10.0000 mg | ORAL_TABLET | Freq: Four times a day (QID) | ORAL | Status: AC
  Administered 2021-01-22 – 2021-01-23 (×4): 10 mg via ORAL
  Filled 2021-01-22 (×4): qty 2
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
## 2021-01-22 NOTE — ED Triage Notes (Signed)
Patient complains of right sided arm and leg numbness since Friday evening. Alert and oiented, denies headache, denies blurred vision. Impairing ambulation

## 2021-01-22 NOTE — ED Notes (Signed)
Attempted to call report; RN unavailable and says she will call back

## 2021-01-22 NOTE — Progress Notes (Addendum)
FPTS Brief Progress Note  S Saw patient at bedside this evening. Patient was sitting up in bed. He reports he was able to use his right hand to eat a sandwhich and a cake. He was able to stand up with assistance but felt like he was dragging his right leg across the floor. Night RN had questions about CBG monitoring.   O: BP (!) 152/95 (BP Location: Right Arm)   Pulse 88   Temp 98.1 F (36.7 C) (Oral)   Resp 18   Ht '6\' 2"'$  (1.88 m)   Wt 111.1 kg   SpO2 96%   BMI 31.46 kg/m    General: Alert, no acute distress Cardio: well perfused  Pulm: normal work of breathing Neuro: Cranial nerves grossly intact   A/P: Plan per day team  -Neurosurgery following-pending starting dexamethasone '10mg'$  Q6H x 4 doses  -Neurology following -CBG monitoring once daily for now -Increase CBG monitoring when if/when pt is on dexamethasone with sSSI -Continue Metformin '500mg'$  BID  - Orders reviewed. Labs for AM ordered, which was adjusted as needed.   Lattie Haw, MD 01/22/2021, 9:26 PM PGY-3, [facility] Night Resident  Please page [phone] with questions.

## 2021-01-22 NOTE — Consult Note (Signed)
Neurosurgery Consultation  Reason for Consult: Brain tumor Referring Physician: Tegeler  CC: Right sided numbness, paresthesias, weakness  HPI: This is a 70 y.o. man that presents with progressive right sided numbness / paresthesias / weakness. He presented to the ED today due to progression of those symptoms. Recent CT chest was negative, +30PY smoking hx w/ COPD, CAD. No history of seizures, no other new symptoms.     ROS: A 14 point ROS was performed and is negative except as noted in the HPI.   PMHx:  Past Medical History:  Diagnosis Date   BMI 30.0-30.9,adult    BPH without obstruction/lower urinary tract symptoms 10/03/2017   CAD (coronary artery disease)    cath 2020, single vessel disease, medical mgt   Chronic left shoulder pain c   Chronic low back pain with right-sided sciatica    Chronic pain 10/07/2013   Combined hyperlipidemia associated with type 2 diabetes mellitus ([place]) 07/09/2019   Controlled type 2 diabetes mellitus without complication, without long-term current use of insulin ([place]) 07/12/2014   COPD (chronic obstructive pulmonary disease) ([place]) 01/07/2014   PFTs 07/2018 minimal airway obstruction, overinflation and no response to bronchodilators.    Coronary artery disease involving native coronary artery of native heart without angina pectoris    Cardiac Cath 01/2019: Severe single-vessel disease with additional moderate two-vessel disease: 100% CTO of RCA, moderate disease in proximal LCx, distal LCx and very small caliber 1st Diag DFR-FFR negative lesion in proximal LCx Normal LV pressures with previously document, ed normal EF.   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   Hypogonadism - pineal glad abnormalities On testosterone replacement   Obesity (BMI 30-39.9)    Obesity (BMI 30.0-34.9) 10/03/2017   OSA on CPAP 10/07/2013   Primary osteoarthritis involving multiple joints 01/10/2015   Rosacea    Smoking greater than 30 pack years - quit 03/2018 10/03/2017   Thoracic and lumbosacral neuritis    Type 2 diabetes mellitus with peripheral neuropathy ([place]) 07/12/2014   FamHx:  Family History  Problem Relation Age of Onset   Pulmonary embolism Mother    Heart disease Father    Lung cancer Father    Lung cancer Sister    Heart disease Maternal Uncle    SocHx:  reports that he quit smoking about 2 years ago. His smoking use included cigarettes. He has a 15.00 pack-year smoking history. He has never used smokeless tobacco. He reports that he does not currently use alcohol. He reports that he does not currently use drugs.  Exam: Vital signs in last 24 hours: Temp:  [98.3 F (36.8 C)] 98.3 F (36.8 C) (08/22 1206) Pulse Rate:  [70-76] 74 (08/22 1400) Resp:  [16-20] 16 (08/22 1400) BP: (125-150)/(89-95) 150/95 (08/22 1400) SpO2:  [93 %-97 %] 94 % (08/22 1400) Weight:  [111.1 kg] 111.1 kg (08/22 1300) General: Awake, alert, cooperative, lying in bed in NAD Head: Normocephalic and atruamatic HEENT: Neck supple Pulmonary: breathing room air comfortably, no evidence of increased work of breathing Cardiac: RRR Abdomen: S NT ND  Extremities: Warm and well perfused x4 Neuro: AOx3, PERRL, EOMI, FS & SS Strength 4/5 in RUE, 4-/5 in RLE, 5/5 in LUE/LLE, dec'd sensation on R in cortical distribution, +RUE drift, +mild RUE apraxia   Assessment and Plan: 70 y.o. man w/ right sided parietal / frontal symptoms. [place] personally reviewed, which shows a left parietal hyperdensity with significant surrounding vasogenic cerebral edema. MRI  brain shows solitary lesion, no other lesions, in the left parietal white matter measuring roughly 1.5cm in diameter, some susceptibility artifact centrally with T1/T2 signal consistent with possible hemorrhage, which could explain why symptoms worsened fairly quickly.   -CT CAP w/ hepatic lesion, recommend getting pathology from this lesion if technically possible. Intracranial lesion is deep and fairly small, could be treated with radiosurgery if we can get pathology from another location.  -discussed w/ the pt, if we can get a diagnosis elsewhere then [facility], otherwise then craniotomy for resection -recommend starting pt on 10q6 dexamethasone x4 doses to help with symptoms, can decrease to 2bid afterwards  [name], MD 01/22/21 3:19 PM [facility]

## 2021-01-22 NOTE — Progress Notes (Addendum)
Family Medicine Teaching Service Daily Progress Note Intern Pager: [phone]  Patient name: Jason Snyder Medical record number: [id number] Date of birth: 06/13/1950 Age: 70 y.o. Gender: male  Primary Care Provider: [name], MD Consultants: Neurology, neurosurgery Code Status: Full  Pt Overview and Major Events to Date:  8/22: admitted, imaging revealed primary neoplasm on liver and possible brain met  Assessment and Plan: Jason Snyder is a 70 y/o M presenting with right-sided weakness and numbness.PMHx significant for HTN, CAD, COPD, HLD, T2DM, BPH s/p prostate cryoablation.  Right-sided weakness and numbness 2/2 brain mass  primary liver neoplasm Presented to ED with right-sided upper and lower extremity weakness and numbness progressively worsened through week.  CT head noted left parietal mass concerning for metastasis, supported by MRI.  CT abdomen noted hypodensity within the right lobe of liver concerning for primary neoplasm.  At this time, patient's symptoms are strictly weakness and numbness on the right side.  Denies headaches, loss of vision, weight loss.  Patient is stable currently.  While awaiting neurosurgery recommendations, plan for liver biopsy.  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  Introduced long-term considerations for patient care and advised him to begin having discussions with wife in the event that outcome is not favorable. -Neurology following, appreciate recommendations -Neurosurgery following, appreciate recommendations -Continue dexamethasone 10mg  q6h x 4 doses -Consult IR for ultrasound-guided liver biopsy -Consider heparin and n.p.o. status pending procedure with IR -Check PSA  HTN: Chronic, stable Blood pressures ranging from 130s-160s/90s.  Home meds: Metoprolol tartrate 50 mg twice daily, losartan-HCTZ 100-25 mg daily, amlodipine 10 mg daily -Continue metoprolol tartrate 50 mg twice daily -Continue to hold losartan-HCTZ and  amlodipine to avoid hypotension in setting of cerebral edema  T2DM: Chronic, stable Last hemoglobin A1c on 04/11/2020: 6.2%.  Last glucose was 163.   -Continue home meds: Farxiga 10 mg daily and metformin 500 mg twice daily  HLD: Chronic, stable -Continue home meds atorvastatin 20 mg daily at bedtime  BPH s/p prostate cryoablation -Continue home med Flomax 0.8 mg daily  COPD: Chronic, stable 30+ PY smoking history. Quit in 2018. Home meds: Stiolto Respimat 2.5-2.5, albuterol inhaler 2 puffs every 4 hours as needed -Continue Brovana nebulizer 15 mcg twice daily and Incruse Ellipta 1 puff daily -Albuterol inhaler 2 puffs every 4 hours as needed   FEN/GI: Regular PPx: Nothing Dispo: pending clinical improvement . Barriers include neurosurgery recs and biopsy.   Subjective:  Patient states he is unchanged from yesterday.  Amenable to plan and waiting for surgery continue to receive steroids for brain mass.  States his wife will be coming by today.  Objective: Temp:  [97.5 F (36.4 C)-98.1 F (36.7 C)] 97.6 F (36.4 C) (08/23 1114) Pulse Rate:  [67-88] 82 (08/23 1114) Resp:  [16-20] 18 (08/23 1114) BP: (113-160)/(57-98) 137/98 (08/23 1114) SpO2:  [91 %-96 %] 96 % (08/23 1114) Physical Exam: General: Awake, alert, no acute distress Cardiovascular: RRR, no murmurs auscultated Respiratory: CTA B, no crackles or wheezing, no increased work of breathing Abdomen: Soft, nontender, obese abdomen, normoactive bowel sounds, no hepatomegaly  Laboratory: Recent Labs  Lab 01/22/21 1219 01/23/21 0346  WBC 7.5 9.6  HGB 17.0 16.6  HCT 50.8 49.0  PLT 251 232   Recent Labs  Lab 01/22/21 1219 01/23/21 0346  NA 136 135  K 4.0 4.0  CL 97* 99  CO2 26 27  Snyder 16 18  CREATININE 1.09 1.11  CALCIUM 9.6 9.2  PROT 7.3  --  BILITOT 1.3*  --   ALKPHOS 58  --   ALT 26  --   AST 23  --   GLUCOSE 126* 152*   CBG (last 3)  Recent Labs    01/22/21 1306 01/23/21 0518 01/23/21 1204   GLUCAP 116* 163* 173*    Imaging/Diagnostic Tests: No new imaging/diagnostic tests since admission yesterday.  Wells Guiles, DO 01/23/2021, 1:36 PM PGY-1, [place] Intern pager: [phone], text pages welcome

## 2021-01-22 NOTE — ED Provider Notes (Addendum)
[facility] EMERGENCY DEPARTMENT Provider Note   CSN: [id number] Arrival date & time: 01/22/21  1200     History No chief complaint on file.   Jason Snyder is a 70 y.o. male.  Pt reports he thinks he had a stroke on Friday.  Pt complains of numbness and weakness in his right hand and right foot.  Pt reports difficulty lifting his leg and walking.  Pt noticed tingling in his right side 2 weeks ago.  Pt was concerned that he could have neuropathy.    The history is provided by the patient. No language interpreter was used.  Neurologic Problem This is a new problem. The problem occurs constantly. Nothing aggravates the symptoms. Nothing relieves the symptoms. He has tried nothing for the symptoms.      Past Medical History:  Diagnosis Date   BMI 30.0-30.9,adult    BPH without obstruction/lower urinary tract symptoms 10/03/2017   CAD (coronary artery disease)    cath 2020, single vessel disease, medical mgt   Chronic left shoulder pain c   Chronic low back pain with right-sided sciatica    Chronic pain 10/07/2013   Combined hyperlipidemia associated with type 2 diabetes mellitus ([place]) 07/09/2019   Controlled type 2 diabetes mellitus without complication, without long-term current use of insulin ([place]) 07/12/2014   COPD (chronic obstructive pulmonary disease) ([place]) 01/07/2014   PFTs 07/2018 minimal airway obstruction, overinflation and no response to bronchodilators.    Coronary artery disease involving native coronary artery of native heart without angina pectoris    Cardiac Cath 01/2019: Severe single-vessel disease with additional moderate two-vessel disease: 100% CTO of RCA, moderate disease in proximal LCx, distal LCx and very small caliber 1st Diag DFR-FFR negative lesion in proximal LCx Normal LV pressures with previously document, ed normal EF.   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   Hypogonadism - pineal glad abnormalities On testosterone replacement   Obesity (BMI 30-39.9)    Obesity (BMI 30.0-34.9) 10/03/2017   OSA on CPAP 10/07/2013   Primary osteoarthritis involving multiple joints 01/10/2015   Rosacea    Smoking greater than 30 pack years - quit 03/2018 10/03/2017   Thoracic and lumbosacral neuritis    Type 2 diabetes mellitus with peripheral neuropathy ([place]) 07/12/2014    Patient Active Problem List   Diagnosis Date Noted   Thoracic and lumbosacral neuritis    Obesity (BMI 30-39.9)    Hypertension    Hyperlipidemia    History of lumbar surgery    Diabetes mellitus without complication ([place])    DDD (degenerative disc disease), lumbar    Chronic low back pain with right-sided sciatica    Chronic left shoulder pain    CAD (coronary artery disease)    BMI 30.0-30.9,adult    Combined hyperlipidemia associated with type 2 diabetes mellitus ([place]) 07/09/2019   Coronary artery disease involving native coronary artery of native heart without angina pectoris    Encounter for screening for lung cancer 11/09/2018   Rosacea 10/03/2017   Smoking greater than 30 pack years -  quit 03/2018 10/03/2017   BPH without obstruction/lower urinary tract symptoms 10/03/2017   Gastroesophageal reflux disease 01/10/2015   Primary osteoarthritis involving multiple joints 01/10/2015   Type 2 diabetes mellitus with peripheral neuropathy ([place]) 07/12/2014   Controlled type 2 diabetes mellitus without complication, without long-term current use of insulin ([place]) 07/12/2014   COPD (chronic obstructive pulmonary disease) ([place]) 01/07/2014   Chronic  pain 10/07/2013   OSA on CPAP 10/07/2013   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   Essential hypertension 08/31/2010   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    Past Surgical History:  Procedure Laterality Date   ARTHROSCOPIC REPAIR ACL     CARDIAC CATHETERIZATION     INTRAVASCULAR PRESSURE WIRE/FFR STUDY N/A 01/01/2019   Procedure: INTRAVASCULAR PRESSURE WIRE/FFR STUDY;  Surgeon: Leonie Man, MD;  Location: [facility];  Service: Cardiovascular;  Laterality: N/A;   LEFT HEART CATH AND CORONARY ANGIOGRAPHY N/A 01/01/2019   Procedure: LEFT HEART CATH AND CORONARY ANGIOGRAPHY;  Surgeon: Leonie Man, MD;  Location: [facility];  Service: Cardiovascular;  Laterality: N/A;   PROSTATE CRYOABLATION     03/2019   SHOULDER ARTHROSCOPY     SPINE SURGERY         Family History  Problem Relation Age of Onset   Pulmonary embolism Mother    Heart disease Father    Lung cancer Father    Lung cancer Sister    Heart disease Maternal Uncle     Social History   Tobacco Use   Smoking status: Former    Packs/day: 0.50    Years: 30.00    Pack years: 15.00    Types: Cigarettes    Quit date: 03/05/2018    Years since quitting: 2.8   Smokeless tobacco: Never   Tobacco comments:    no cigarettes since 03/05/18  Vaping Use   Vaping Use: Never used  Substance Use Topics   Alcohol use: Not Currently   Drug use: Not Currently    Home Medications Prior to Admission medications   Medication Sig Start Date End Date Taking? Authorizing Provider  acetaminophen-codeine (TYLENOL #3) 300-30 MG tablet Take 1 tablet by mouth 2 (two) times daily as needed for moderate pain. 01/11/21  Yes [name], MD  acetic acid 2 % otic solution Place 4 drops into both ears 2 (two) times daily as needed. 10/06/19  Yes [name], MD  albuterol (VENTOLIN HFA) 108 (90 Base) MCG/ACT inhaler Inhale 2 puffs by mouth into the lungs every 4 hours as needed for wheezing 12/05/20  Yes [name], MD   amLODipine (NORVASC) 10 MG tablet TAKE ONE TABLET BY MOUTH ONE TIME DAILY 11/17/20  Yes [name], MD  aspirin 81 MG EC tablet Take 81 mg by mouth daily.    Yes [provider]  atorvastatin (LIPITOR) 20 MG tablet Take 1 tablet (20 mg total) by mouth at bedtime. 07/12/20  Yes [name], MD  ciprofloxacin-dexamethasone (CIPRODEX) OTIC suspension Place 4 drops into both ears 2 (two) times daily.   Yes [provider]  cyclobenzaprine (FLEXERIL) 10 MG tablet Take 1 tablet (10 mg total) by mouth 3 (three) times daily as needed for muscle spasms. 07/12/20  Yes [name], MD  diclofenac sodium (VOLTAREN) 1 % GEL Apply 2 g topically 4 (four) times daily as needed (pain). 04/21/17  Yes [provider]  FARXIGA 10 MG TABS tablet TAKE ONE TABLET BY MOUTH ONE TIME DAILY 08/28/20  Yes [name], MD  fluticasone [facility]) 50 MCG/ACT nasal spray Place 1 spray into the nose daily as needed for allergies.  04/09/13  Yes [provider]  gabapentin (NEURONTIN) 300 MG capsule Take 1 capsule (300 mg total) by mouth 3 (three) times daily. 01/11/21  Yes [name], MD  Loratadine 10 MG CAPS Take 10 mg by mouth daily as needed (allergies).    Yes [provider]  losartan-hydrochlorothiazide (HYZAAR) 100-25 MG tablet TAKE ONE TABLET BY MOUTH ONE TIME DAILY 10/26/20  Yes [name], MD  metFORMIN (GLUCOPHAGE) 500 MG tablet Take 1 tablet (500 mg total) by mouth 2 (two) times daily with a meal. 01/11/21  Yes [name], MD  metoprolol tartrate (LOPRESSOR) 50 MG tablet Take 1 tablet (50 mg total) by mouth 2 (two) times daily. 04/11/20  Yes [name], MD  Multiple Vitamin (MULTIVITAMIN) capsule Take 1 capsule by mouth daily.    Yes [provider]  nitroGLYCERIN (NITROSTAT) 0.4 MG SL tablet Place 1 tablet (0.4 mg total) under the tongue every 5 (five) minutes as needed for chest pain. 08/28/20  Yes Richardo Priest, MD  Spacer/Aero-Holding  Chambers (AEROCHAMBER MV) inhaler Use as instructed 11/20/20  Yes Magdalen Spatz, NP  tamsulosin (FLOMAX) 0.4 MG CAPS capsule TAKE TWO CAPSULES BY MOUTH DAILY 03/02/20  Yes [name], MD  Ketotifen Fumarate (ALLERGY EYE DROPS OP) Place 1 drop into both eyes daily.    [provider]  testosterone cypionate (DEPOTESTOSTERONE CYPIONATE) 200 MG/ML injection INJECT 1ML INTO THE MUSCLE EVERY 14 DAYS Patient taking differently: Inject 100 mg into the muscle once a week. 03/24/19   [name], MD  Tiotropium Bromide-Olodaterol (STIOLTO RESPIMAT) 2.5-2.5 MCG/ACT AERS Inhale 2 puffs into the lungs daily. 10/10/20   Mannam, Hart Robinsons, MD  triamcinolone cream (KENALOG) 0.1 % Apply 1 application topically 2 (two) times daily. For 2 weeks, then as needed 10/06/19   [name], MD    Allergies    Rubus fruticosus  Review of Systems   Review of Systems  All other systems reviewed and are negative.  Physical Exam Updated Vital Signs BP (!) 137/91   Pulse 73   Temp 98.3 F (36.8 C) (Oral)   Resp 20   Ht '6\' 2"'$  (1.88 m)   Wt 111.1 kg   SpO2 93%   BMI 31.46 kg/m   Physical Exam Vitals reviewed.  Cardiovascular:     Rate and Rhythm: Normal rate.  Pulmonary:     Effort: Pulmonary effort is normal.  Abdominal:     General: Abdomen is flat.  Musculoskeletal:        General: Normal range of motion.  Skin:    General: Skin is warm.  Neurological:     Mental Status: He is alert and oriented to person, place, and time.     Motor: Weakness present.  Psychiatric:        Mood and Affect: Mood normal.    ED Results / Procedures / Treatments   Labs (all labs ordered are listed, but only abnormal results are displayed) Labs Reviewed  COMPREHENSIVE METABOLIC PANEL - Abnormal; Notable for the following components:      Result Value   Chloride 97 (*)    Glucose, Bld 126 (*)    Total Bilirubin 1.3 (*)    All other components within normal limits  CBG MONITORING, ED - Abnormal;  Notable for the following components:   Glucose-Capillary 116 (*)  All other components within normal limits  PROTIME-INR  APTT  CBC  DIFFERENTIAL    EKG EKG Interpretation  Date/Time:  Monday January 22 2021 12:10:55 EDT Ventricular Rate:  73 PR Interval:  230 QRS Duration: 126 QT Interval:  390 QTC Calculation: 429 R Axis:   81 Text Interpretation: Sinus rhythm with 1st degree A-V block Non-specific intra-ventricular conduction block Abnormal ECG When compared to prior, similar appearance. No STEMI Confirmed by Antony Blackbird [phone]) on 01/22/2021 12:42:30 PM  Radiology No results found.  Procedures Procedures   Medications Ordered in ED Medications  sodium chloride flush (NS) 0.9 % injection 3 mL (has no administration in time range)    ED Course  I have reviewed the triage vital signs and the nursing notes.  Pertinent labs & imaging results that were available during my care of the patient were reviewed by me and considered in my medical decision making (see chart for details).    MDM Rules/Calculators/A&P                           MDM:  Dr. Sherry Ruffing in to see and examine.  Ct scan show a mass with edema.  I spoke to Neurology who will see,  Neurosurgery consulted. Dr. Joaquim Nam will see and evaluate Family practiced called to see and admit Final Clinical Impression(s) / ED Diagnoses Final diagnoses:  Brain mass  Right sided weakness  Right sided numbness    Rx / DC Orders ED Discharge Orders     None        Sidney Ace 01/22/21 [place], PA-C 01/22/21 1549    Fransico Meadow, [place] 01/22/21 [place], Gwenyth Allegra, MD 01/24/21 [phone]

## 2021-01-22 NOTE — ED Notes (Signed)
Patient transported to MRI 

## 2021-01-22 NOTE — H&P (Addendum)
[facility] Admission History and Physical Service Pager: [phone]  Patient name: Jason Snyder Medical record number: [id number] Date of birth: [birth year] Age: 70 y.o. Gender: male  Primary Care Provider: [name], MD Consultants: Neurology, Neurosurgery Code Status: Full code Preferred Emergency Contact: teresa Hornig, wife, [phone]  Chief Complaint: thought I had a stroke  Assessment and Plan: Diem Stinton is a 70 y.o. male presenting with right sided weakness. PMH is significant for HTN, CAD, COPD, HLD, T2DM, BPH s/p prostrate cryoablation  Right sided weakness 2/2 brain mass Patient presented to ED with right-sided upper and lower extremity weakness and numbness that is progressively worsened since last week.  Patient was concerned that he had a stroke after previously believing his symptoms were due to diabetic neuropathy.  CT head noted left parietal mass most concerning for metastasis.  MRI notes 1.9 cm mass which additionally favors metastatic disease or primary neoplasm. CT abdomen notes hypodensity within the right lobe of the liver which is concerning for neoplasm given intracranial findings.  At this time, patient's symptoms are strictly weakness and numbness on the right side.  Denies headaches, loss of vision, weight loss. Patient is stable currently and will follow neurosurgery/neurology recommendations -admit to FPTS, med-tele, attending Dr. Erin Hearing  -f/u neurology recommendations, appreciate recommendations -f/u neurosurgery recommendations, appreciate recommendations -vitals per floor protocol -cardiac monitoring -neuro checks q4h -pulse ox continuous -may receive dexamethasone for symptoms, pending neurosurg recommendations  COPD: chronic, stable Home meds: Stiolto Respimate 2.5-2.5, albuterol inhaler 2 puffs q4h PRN -Brovana nebulizer 59mg BID and Incruse Ellipta 1 puff daily -albuterol inhaler 2 puffs q4h PRN  HLD: chronic,  stable Last lipid panel on 04/11/20: cholesterol 114, HDL 36, Tgs 103, LDL 59 Home meds: atorvastatin '20mg'$  daily at bedtime -continue home meds  HTN  Home meds: metoprolol tartrate '50mg'$  BID, Losartan-HCTZ 100-'25mg'$  daily. Amlodipine '10mg'$  -hold Losartan-HCTZ and amlodipine to avoid hypotension in setting of cerebral edema  -continue metoprolol tartrate '50mg'$  BID  T2DM: chronic, stable Last Hgb A1c on 04/11/20: 6.2%. Home meds: Farxiga '10mg'$  daily, metformin '500mg'$  BID -continue home meds  BPH s/p prostate cryoablation Home meds: Flomax 0.'8mg'$  daily. No urinary difficulties stated at this time.  -continue home meds  FEN/GI: Regular  Prophylaxis: pending neurosurg consult  Disposition: Med tele  History of Present Illness:  Jason Snyder a 70y.o. male presenting with weakness on his right side. He reports that his symptoms included dropping objects while using his right hand last week. Last week he saw his PCP and states that he had no sensation in his right ankle. He was then scheduled for follow up with a neurologist. The numbness then spread to his right hand which led to him dropping glasses of tea. He then progressed to having numbness in his feet. He reports waking up saturday and felt a hand on his face and did not realize it was his own hand. He reports that he can not lift his right leg like normal and it causes him to limp. He reports having a hx of neuropathy in both feet but his left sided extremities feel normal.  Patient reports he has been constipated (hard stools) for three weeks but is still having bowel movements; his last BM was this am.  He reports having to take nitro periodically and last had to take nitro ~1 month ago.   Stopped smoking in 2018.  Review Of Systems: Per HPI with the following additions:   Review of Systems  Constitutional:  Negative for appetite change.  HENT:  Negative for congestion.   Respiratory:  Positive for shortness of breath. Negative for  cough.   Cardiovascular:  Negative for chest pain.  Gastrointestinal:  Positive for constipation. Negative for abdominal pain, blood in stool, diarrhea, nausea and vomiting.  Genitourinary:  Negative for difficulty urinating and hematuria.  Musculoskeletal:  Positive for gait problem.  Neurological:  Positive for weakness. Negative for numbness.    Patient Active Problem List   Diagnosis Date Noted   Thoracic and lumbosacral neuritis    Obesity (BMI 30-39.9)    Hypertension    Hyperlipidemia    History of lumbar surgery    Diabetes mellitus without complication ([place])    DDD (degenerative disc disease), lumbar    Chronic low back pain with right-sided sciatica    Chronic left shoulder pain    CAD (coronary artery disease)    BMI 30.0-30.9,adult    Combined hyperlipidemia associated with type 2 diabetes mellitus ([place]) 07/09/2019   Coronary artery disease involving native coronary artery of native heart without angina pectoris    Encounter for screening for lung cancer 11/09/2018   Rosacea 10/03/2017   Smoking greater than 30 pack years - quit 03/2018 10/03/2017   BPH without obstruction/lower urinary tract symptoms 10/03/2017   Gastroesophageal reflux disease 01/10/2015   Primary osteoarthritis involving multiple joints 01/10/2015   Type 2 diabetes mellitus with peripheral neuropathy ([place]) 07/12/2014   Controlled type 2 diabetes mellitus without complication, without long-term current use of insulin ([place]) 07/12/2014   COPD (chronic obstructive pulmonary disease) ([place]) 01/07/2014   Chronic pain 10/07/2013   OSA on CPAP 10/07/2013   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   Essential hypertension 08/31/2010   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    Past Medical History: Past Medical History:  Diagnosis Date   BMI 30.0-30.9,adult    BPH without obstruction/lower urinary tract symptoms 10/03/2017   CAD (coronary artery disease)    cath 2020, single vessel disease, medical mgt   Chronic left  shoulder pain c   Chronic low back pain with right-sided sciatica    Chronic pain 10/07/2013   Combined hyperlipidemia associated with type 2 diabetes mellitus ([place]) 07/09/2019   Controlled type 2 diabetes mellitus without complication, without long-term current use of insulin ([place]) 07/12/2014   COPD (chronic obstructive pulmonary disease) ([place]) 01/07/2014   PFTs 07/2018 minimal airway obstruction, overinflation and no response to bronchodilators.    Coronary artery disease involving native coronary artery of native heart without angina pectoris    Cardiac Cath 01/2019: Severe single-vessel disease with additional moderate two-vessel disease: 100% CTO of RCA, moderate disease in proximal LCx, distal LCx and very small caliber 1st Diag DFR-FFR negative lesion in proximal LCx Normal LV pressures with previously document, ed normal EF.   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   Hypogonadism - pineal glad abnormalities On testosterone replacement   Obesity (BMI 30-39.9)    Obesity (BMI 30.0-34.9) 10/03/2017   OSA on CPAP 10/07/2013   Primary osteoarthritis involving multiple joints 01/10/2015  Rosacea    Smoking greater than 30 pack years - quit 03/2018 10/03/2017   Thoracic and lumbosacral neuritis    Type 2 diabetes mellitus with peripheral neuropathy ([place]) 07/12/2014    Past Surgical History: Past Surgical History:  Procedure Laterality Date   ARTHROSCOPIC REPAIR ACL     CARDIAC CATHETERIZATION     INTRAVASCULAR PRESSURE WIRE/FFR STUDY N/A 01/01/2019   Procedure:  INTRAVASCULAR PRESSURE WIRE/FFR STUDY;  Surgeon: Leonie Man, MD;  Location: [facility];  Service: Cardiovascular;  Laterality: N/A;   LEFT HEART CATH AND CORONARY ANGIOGRAPHY N/A 01/01/2019   Procedure: LEFT HEART CATH AND CORONARY ANGIOGRAPHY;  Surgeon: Leonie Man, MD;  Location: [facility];  Service: Cardiovascular;  Laterality: N/A;   PROSTATE CRYOABLATION     03/2019   SHOULDER ARTHROSCOPY     SPINE SURGERY      Social History: Social History   Tobacco Use   Smoking status: Former    Packs/day: 0.50    Years: 30.00    Pack years: 15.00    Types: Cigarettes    Quit date: 03/05/2018    Years since quitting: 2.8   Smokeless tobacco: Never   Tobacco comments:    no cigarettes since 03/05/18  Vaping Use   Vaping Use: Never used  Substance Use Topics   Alcohol use: Not Currently   Drug use: Not Currently   Additional social history:   Please also refer to relevant sections of EMR.  Family History: Family History  Problem Relation Age of Onset   Pulmonary embolism Mother    Heart disease Father    Lung cancer Father    Lung cancer Sister    Heart disease Maternal Uncle     Allergies and Medications: Allergies  Allergen Reactions   Rubus Fruticosus Rash    Blackberries   No current facility-administered medications on file prior to encounter.   Current Outpatient Medications on File Prior to Encounter  Medication Sig Dispense Refill   acetaminophen-codeine (TYLENOL #3) 300-30 MG tablet Take 1 tablet by mouth 2 (two) times daily as needed for moderate pain. 90 tablet 0   acetic acid 2 % otic solution Place 4 drops into both ears 2 (two) times daily as needed. (Patient taking differently: Place 4 drops into both ears 2 (two) times daily as needed (pain).) 15 mL 2   albuterol (VENTOLIN HFA) 108 (90 Base) MCG/ACT inhaler Inhale 2 puffs by mouth into the lungs every 4 hours as needed for wheezing 18 g 0   amLODipine (NORVASC) 10 MG tablet TAKE  ONE TABLET BY MOUTH ONE TIME DAILY (Patient taking differently: Take 10 mg by mouth daily.) 90 tablet 0   aspirin 81 MG EC tablet Take 81 mg by mouth daily.      atorvastatin (LIPITOR) 20 MG tablet Take 1 tablet (20 mg total) by mouth at bedtime. 90 tablet 3   ciprofloxacin-dexamethasone (CIPRODEX) OTIC suspension Place 4 drops into both ears 2 (two) times daily.     cyclobenzaprine (FLEXERIL) 10 MG tablet Take 1 tablet (10 mg total) by mouth 3 (three) times daily as needed for muscle spasms. 90 tablet 3   diclofenac sodium (VOLTAREN) 1 % GEL Apply 2 g topically 4 (four) times daily as needed (pain).     FARXIGA 10 MG TABS tablet TAKE ONE TABLET BY MOUTH ONE TIME DAILY (Patient taking differently: Take 10 mg by mouth daily.) 90 tablet 3   fluticasone (FLONASE) 50 MCG/ACT nasal spray  Place 1 spray into the nose daily as needed for allergies.      gabapentin (NEURONTIN) 300 MG capsule Take 1 capsule (300 mg total) by mouth 3 (three) times daily. 180 capsule 3   ibuprofen (ADVIL) 200 MG tablet Take 200 mg by mouth every 6 (six) hours as needed for mild pain.     Loratadine 10 MG CAPS Take 10 mg by mouth daily as needed (allergies).      losartan-hydrochlorothiazide (HYZAAR) 100-25 MG tablet TAKE ONE TABLET BY MOUTH ONE TIME DAILY 90 tablet 0   metFORMIN (GLUCOPHAGE) 500 MG tablet Take 1 tablet (500 mg total) by mouth 2 (two) times daily with a meal. 180 tablet 0   metoprolol tartrate (LOPRESSOR) 50 MG tablet Take 1 tablet (50 mg total) by mouth 2 (two) times daily. 180 tablet 3   Multiple Vitamin (MULTIVITAMIN) capsule Take 1 capsule by mouth daily.      nitroGLYCERIN (NITROSTAT) 0.4 MG SL tablet Place 1 tablet (0.4 mg total) under the tongue every 5 (five) minutes as needed for chest pain. 25 tablet 0   Spacer/Aero-Holding Chambers (AEROCHAMBER MV) inhaler Use as instructed 1 each 0   tamsulosin (FLOMAX) 0.4 MG CAPS capsule TAKE TWO CAPSULES BY MOUTH DAILY (Patient taking differently: Take 0.4 mg by  mouth daily.) 180 capsule 0   testosterone cypionate (DEPOTESTOSTERONE CYPIONATE) 200 MG/ML injection INJECT 1ML INTO THE MUSCLE EVERY 14 DAYS (Patient taking differently: Inject 200 mg into the muscle once a week. Friday) 10 mL 2   Tiotropium Bromide-Olodaterol (STIOLTO RESPIMAT) 2.5-2.5 MCG/ACT AERS Inhale 2 puffs into the lungs daily. 4 g 11   Ketotifen Fumarate (ALLERGY EYE DROPS OP) Place 1 drop into both eyes daily as needed (allergy).     triamcinolone cream (KENALOG) 0.1 % Apply 1 application topically 2 (two) times daily. For 2 weeks, then as needed (Patient not taking: Reported on 01/22/2021) 45 g 0    Objective: BP (!) 150/95   Pulse 74   Temp 98.3 F (36.8 C) (Oral)   Resp 16   Ht '6\' 2"'$  (1.88 m)   Wt 111.1 kg   SpO2 94%   BMI 31.46 kg/m   Exam: General: Awake, alert, conversational, no acute distress Eyes: EOMI, PERRLA ENTM: Moist mucous membranes Neck: Supple Cardiovascular: RRR, no murmurs auscultated Respiratory: CTA B, no crackles or wheezing, no increased work of breathing Gastrointestinal: Soft, nontender, obese abdomen MSK: Normal tone Derm: Telangiectasias of the nose and cheeks consistent with rosacea Neuro: Right upper extremity 4/5 strength, right lower extremity 3/5 strength.  Cranial nerves grossly intact with exception of decreased sensation on left side of face.  No evidence of aphasia Psych: Normal mood and affect  Labs and Imaging: CBC BMET  Recent Labs  Lab 01/22/21 1219  WBC 7.5  HGB 17.0  HCT 50.8  PLT 251   Recent Labs  Lab 01/22/21 1219  NA 136  K 4.0  CL 97*  CO2 26  BUN 16  CREATININE 1.09  GLUCOSE 126*  CALCIUM 9.6     EKG: NSR with prolonged PR interval consistent with first-degree heart block, normal QRS interval, normal ST interval and T waves  CT head w/o contrast: Left parietal mass with extensive vasogenic edema most concerning for a metastasis.   MR brain W/wo contrast: 1.9 cm mass within the left frontoparietal  white matter with extensive surrounding edema. Favor metastatic disease over primary neoplasm.  CT chest/abdomen/pelvis w/ contrast:  1. Indeterminate hypodensity within the right lobe  liver abutting the gallbladder fossa, concerning for neoplasm given intracranial findings. If tissue diagnosis is desired, image guided biopsy could be considered. Given the decreased conspicuity on delayed imaging, follow-up liver ultrasound with possible ultrasound-guided biopsy could be considered. Alternatively, dedicated liver MRI could be considered. 2. Enlarged prostate, with asymmetric decreased attenuation of the right peripheral zone of the prostate. Please correlate with serum PSA and physical exam findings. 3. Distended urinary bladder, which may reflect chronic bladder outlet obstruction given the enlarged prostate. 4. 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 5. No acute intrathoracic process. 6. Aortic Atherosclerosis. Severe atherosclerosis of the bilateral common femoral arteries with greater than 90% stenosis.  Eulis Foster, MD 01/22/2021, 3:12 PM PGY-1, [place] Intern pager: [phone], text pages welcome

## 2021-01-23 ENCOUNTER — Other Ambulatory Visit (HOSPITAL_COMMUNITY): Payer: Medicare Other

## 2021-01-23 ENCOUNTER — Other Ambulatory Visit: Payer: Self-pay

## 2021-01-23 ENCOUNTER — Encounter (HOSPITAL_COMMUNITY): Payer: Self-pay | Admitting: Family Medicine

## 2021-01-23 ENCOUNTER — Inpatient Hospital Stay (HOSPITAL_COMMUNITY): Payer: Medicare Other

## 2021-01-23 ENCOUNTER — Encounter: Payer: Self-pay | Admitting: Family Medicine

## 2021-01-23 DIAGNOSIS — I2510 Atherosclerotic heart disease of native coronary artery without angina pectoris: Secondary | ICD-10-CM | POA: Diagnosis present

## 2021-01-23 DIAGNOSIS — Z79899 Other long term (current) drug therapy: Secondary | ICD-10-CM | POA: Diagnosis not present

## 2021-01-23 DIAGNOSIS — D496 Neoplasm of unspecified behavior of brain: Secondary | ICD-10-CM | POA: Diagnosis not present

## 2021-01-23 DIAGNOSIS — C159 Malignant neoplasm of esophagus, unspecified: Secondary | ICD-10-CM | POA: Diagnosis not present

## 2021-01-23 DIAGNOSIS — K8689 Other specified diseases of pancreas: Secondary | ICD-10-CM | POA: Diagnosis not present

## 2021-01-23 DIAGNOSIS — K7689 Other specified diseases of liver: Secondary | ICD-10-CM | POA: Diagnosis not present

## 2021-01-23 DIAGNOSIS — E785 Hyperlipidemia, unspecified: Secondary | ICD-10-CM | POA: Diagnosis not present

## 2021-01-23 DIAGNOSIS — K769 Liver disease, unspecified: Secondary | ICD-10-CM | POA: Diagnosis not present

## 2021-01-23 DIAGNOSIS — G8929 Other chronic pain: Secondary | ICD-10-CM | POA: Diagnosis present

## 2021-01-23 DIAGNOSIS — R200 Anesthesia of skin: Secondary | ICD-10-CM | POA: Diagnosis not present

## 2021-01-23 DIAGNOSIS — R531 Weakness: Secondary | ICD-10-CM | POA: Diagnosis present

## 2021-01-23 DIAGNOSIS — I10 Essential (primary) hypertension: Secondary | ICD-10-CM | POA: Diagnosis not present

## 2021-01-23 DIAGNOSIS — G9389 Other specified disorders of brain: Secondary | ICD-10-CM | POA: Diagnosis not present

## 2021-01-23 DIAGNOSIS — R1314 Dysphagia, pharyngoesophageal phase: Secondary | ICD-10-CM | POA: Diagnosis not present

## 2021-01-23 DIAGNOSIS — C787 Secondary malignant neoplasm of liver and intrahepatic bile duct: Secondary | ICD-10-CM | POA: Diagnosis not present

## 2021-01-23 DIAGNOSIS — K862 Cyst of pancreas: Secondary | ICD-10-CM | POA: Diagnosis not present

## 2021-01-23 DIAGNOSIS — Z87891 Personal history of nicotine dependence: Secondary | ICD-10-CM | POA: Diagnosis not present

## 2021-01-23 DIAGNOSIS — R160 Hepatomegaly, not elsewhere classified: Secondary | ICD-10-CM | POA: Diagnosis not present

## 2021-01-23 DIAGNOSIS — K2289 Other specified disease of esophagus: Secondary | ICD-10-CM | POA: Diagnosis not present

## 2021-01-23 DIAGNOSIS — Z91018 Allergy to other foods: Secondary | ICD-10-CM | POA: Diagnosis not present

## 2021-01-23 DIAGNOSIS — K3189 Other diseases of stomach and duodenum: Secondary | ICD-10-CM | POA: Diagnosis not present

## 2021-01-23 DIAGNOSIS — Z6831 Body mass index (BMI) 31.0-31.9, adult: Secondary | ICD-10-CM | POA: Diagnosis not present

## 2021-01-23 DIAGNOSIS — E1142 Type 2 diabetes mellitus with diabetic polyneuropathy: Secondary | ICD-10-CM | POA: Diagnosis not present

## 2021-01-23 DIAGNOSIS — J449 Chronic obstructive pulmonary disease, unspecified: Secondary | ICD-10-CM | POA: Diagnosis not present

## 2021-01-23 DIAGNOSIS — E669 Obesity, unspecified: Secondary | ICD-10-CM | POA: Diagnosis not present

## 2021-01-23 DIAGNOSIS — K760 Fatty (change of) liver, not elsewhere classified: Secondary | ICD-10-CM | POA: Diagnosis not present

## 2021-01-23 DIAGNOSIS — K2100 Gastro-esophageal reflux disease with esophagitis, without bleeding: Secondary | ICD-10-CM | POA: Diagnosis present

## 2021-01-23 DIAGNOSIS — E782 Mixed hyperlipidemia: Secondary | ICD-10-CM | POA: Diagnosis present

## 2021-01-23 DIAGNOSIS — E1169 Type 2 diabetes mellitus with other specified complication: Secondary | ICD-10-CM | POA: Diagnosis not present

## 2021-01-23 DIAGNOSIS — M47816 Spondylosis without myelopathy or radiculopathy, lumbar region: Secondary | ICD-10-CM | POA: Diagnosis not present

## 2021-01-23 DIAGNOSIS — C229 Malignant neoplasm of liver, not specified as primary or secondary: Secondary | ICD-10-CM | POA: Diagnosis not present

## 2021-01-23 DIAGNOSIS — K209 Esophagitis, unspecified without bleeding: Secondary | ICD-10-CM | POA: Diagnosis not present

## 2021-01-23 DIAGNOSIS — N400 Enlarged prostate without lower urinary tract symptoms: Secondary | ICD-10-CM | POA: Diagnosis present

## 2021-01-23 DIAGNOSIS — Z888 Allergy status to other drugs, medicaments and biological substances status: Secondary | ICD-10-CM | POA: Diagnosis not present

## 2021-01-23 DIAGNOSIS — G936 Cerebral edema: Secondary | ICD-10-CM | POA: Diagnosis not present

## 2021-01-23 DIAGNOSIS — C221 Intrahepatic bile duct carcinoma: Secondary | ICD-10-CM | POA: Diagnosis not present

## 2021-01-23 DIAGNOSIS — R1319 Other dysphagia: Secondary | ICD-10-CM | POA: Diagnosis not present

## 2021-01-23 DIAGNOSIS — K5900 Constipation, unspecified: Secondary | ICD-10-CM | POA: Diagnosis present

## 2021-01-23 DIAGNOSIS — C7931 Secondary malignant neoplasm of brain: Secondary | ICD-10-CM | POA: Diagnosis not present

## 2021-01-23 DIAGNOSIS — Z20822 Contact with and (suspected) exposure to covid-19: Secondary | ICD-10-CM | POA: Diagnosis not present

## 2021-01-23 DIAGNOSIS — Z8249 Family history of ischemic heart disease and other diseases of the circulatory system: Secondary | ICD-10-CM | POA: Diagnosis not present

## 2021-01-23 DIAGNOSIS — C7889 Secondary malignant neoplasm of other digestive organs: Secondary | ICD-10-CM | POA: Diagnosis not present

## 2021-01-23 DIAGNOSIS — K829 Disease of gallbladder, unspecified: Secondary | ICD-10-CM | POA: Diagnosis not present

## 2021-01-23 LAB — BASIC METABOLIC PANEL
Anion gap: 9 (ref 5–15)
BUN: 18 mg/dL (ref 8–23)
CO2: 27 mmol/L (ref 22–32)
Calcium: 9.2 mg/dL (ref 8.9–10.3)
Chloride: 99 mmol/L (ref 98–111)
Creatinine, Ser: 1.11 mg/dL (ref 0.61–1.24)
GFR, Estimated: >60 mL/min (ref >60)
Glucose, Bld: 152 mg/dL — ABNORMAL HIGH (ref 70–99)
Potassium: 4 mmol/L (ref 3.5–5.1)
Sodium: 135 mmol/L (ref 135–145)

## 2021-01-23 LAB — CBC
HCT: 49 % (ref 39.0–52.0)
Hemoglobin: 16.6 g/dL (ref 13.0–17.0)
MCH: 31.9 pg (ref 26.0–34.0)
MCHC: 33.9 g/dL (ref 30.0–36.0)
MCV: 94 fL (ref 80.0–100.0)
Platelets: 232 10*3/uL (ref 150–400)
RBC: 5.21 MIL/uL (ref 4.22–5.81)
RDW: 15.3 % (ref 11.5–15.5)
WBC: 9.6 10*3/uL (ref 4.0–10.5)
nRBC: 0 % (ref 0.0–0.2)

## 2021-01-23 LAB — GLUCOSE, CAPILLARY
Glucose-Capillary: 163 mg/dL — ABNORMAL HIGH (ref 70–99)
Glucose-Capillary: 173 mg/dL — ABNORMAL HIGH (ref 70–99)
Glucose-Capillary: 167 mg/dL — ABNORMAL HIGH (ref 70–99)
Glucose-Capillary: 164 mg/dL — ABNORMAL HIGH (ref 70–99)

## 2021-01-23 MED ORDER — HEPARIN SODIUM (PORCINE) 5000 UNIT/ML IJ SOLN
5000.0000 [IU] | Freq: Three times a day (TID) | INTRAMUSCULAR | Status: DC
  Administered 2021-01-23: 5000 [IU] via SUBCUTANEOUS
  Filled 2021-01-23 (×3): qty 1

## 2021-01-23 MED ORDER — GADOBUTROL 1 MMOL/ML IV SOLN
10.0000 mL | Freq: Once | INTRAVENOUS | Status: AC | PRN
  Administered 2021-01-23: 10 mL via INTRAVENOUS

## 2021-01-23 NOTE — Progress Notes (Signed)
Family Medicine Teaching Service Daily Progress Note Intern Pager: [phone]  Patient name: Jason Snyder Medical record number: [id number] Date of birth: 25-Nov-1950 Age: 70 y.o. Gender: male  Primary Care Provider: [name], MD Consultants: Neurology (s/o), neurosurgery, IR Code Status: Full  Pt Overview and Major Events to Date:  8/22: admitted, imaging revealed primary neoplasm on liver and possible brain met  Assessment and Plan: Shem Plemmons is a 70 y/o M presenting with right-sided weakness and numbness. PMHx significant for HTN, CAD, COPD, HLD, T2DM, BPH s/p prostate cryoablation.  Right-sided weakness and numbness 2/2 brain mass  primary liver neoplasm Presented to ED with R-sided upper and lower extremity weakness and numbness progressively worsened through week. CT head noted left parietal mass concerning for metastasis, supported by MRI. CT abdomen noted hypodensity within the R lobe of liver concerning for primary neoplasm. At this time, patient's symptoms are strictly weakness and numbness on the right side. Denies headaches, loss of vision, weight loss.  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  Small lymph nodes in the area in the GE junction.  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 PSA normal.  - Neurosurgery following, appreciate recommendations - Scheduled for US-guided liver biopsy with IR -Dexamethasone 2mg  twice daily per neurosurgery recommendations -Neurosurgery did not have any specific recommendations for blood pressure management  HTN: Chronic, stable Blood pressures ranging 130s-160s/90s. Home meds: metoprolol tartrate 50mg  twice dialy, losartan-HCTZ 100-25mg  daily, amlodipine 10mg  daily -Continue metoprolol tartrate 50mg  twice daily -Continue to hold losartan-HCTZ and amlodipine to avoid  hypotension in setting of cerebral edema  T2DM: Chronic, stable Last Hgb A1c on 04/11/20: 6.2%. Last glucose was 163.  -Continue home meds: Farxiga 10 mg daily and metformin 500mg  twice daily  HLD: Chronic, stable -continue home meds atorvastatin 20mg  daily at bedtime  BPH s/p prostate cryoablation -continue home med Flomax 0.8mg  daily  COPD: Chronic, stable 30+ PY smoking history. Quit in 2018. Home meds: Stiolto Respimat 2.5-2.5, albuterol inhaler 2 puffs every 4 hours as needed -Continue Brovana nebulizer 15 mcg twice daily and Incurse Ellipta 1 puff daily -Albuterol inhaler 2 puffs every 4 hours as needed  FEN/GI: Regular PPx: Hold heparin for liver biopsy Dispo: CIR pending clinical improvement . Barriers include neurosurgery recommendations and liver biopsy.   Subjective:  Patient states he is doing well today and feels that his right-sided weakness/numbness is improving and that he is able to lift his leg higher and has less numbness of his leg.  States his hand is still completely numb and he is having trouble texting with that hand.  Objective: Temp:  [97.4 F (36.3 C)-98.3 F (36.8 C)] 98.3 F (36.8 C) (08/24 1146) Pulse Rate:  [74-84] 74 (08/24 1146) Resp:  [15-22] 18 (08/24 1040) BP: (110-141)/(59-105) 132/82 (08/24 1146) SpO2:  [92 %-97 %] 93 % (08/24 1146) Physical Exam: General: Awake, alert, conversational, no acute distress Cardiovascular: RRR, no murmurs auscultated Respiratory: CTA B, no increased work of breathing Abdomen: Soft, nondistended, normoactive bowel sounds Extremities: 4/5 strength of right upper and lower extremities, numbness of right hand, numbness of lateral portion of right foot  Laboratory: Recent Labs  Lab 01/22/21 1219 01/23/21 0346  WBC 7.5 9.6  HGB 17.0 16.6  HCT 50.8 49.0  PLT 251 232   Recent Labs  Lab 01/22/21 1219 01/23/21 0346  NA 136 135  K 4.0 4.0  CL  97* 99  CO2 26 27  BUN 16 18  CREATININE 1.09 1.11  CALCIUM  9.6 9.2  PROT 7.3  --   BILITOT 1.3*  --   ALKPHOS 58  --   ALT 26  --   AST 23  --   GLUCOSE 126* 152*   PSA: 1.28 (wnl)  Imaging/Diagnostic Tests: MR liver with/without contrast: 1. 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 Endoscopic assessment may be helpful for further evaluation. Small lymph nodes in the area of the gastroesophageal junction also suspicious. 2. Hepatic steatosis. 3. Cystic lesion in the head of the pancreas without high-risk features of ductal dilation or mural nodularity. 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 Consider short interval follow-up at 6 month interval common approach that may be more reasonable in the current context. Based on size a more aggressive approach would be endoscopic ultrasound and sampling. 4. Atherosclerosis with some narrowing of the origin of the SMA.  Wells Guiles, DO 01/24/2021, 3:30 PM PGY-1, [place] Intern pager: [phone], text pages welcome

## 2021-01-23 NOTE — Plan of Care (Signed)
Please see Dr. Karolee Stamps note for full EEG report.  Given low potential for seizures at this time and the fact the clinical guidelines do not support starting antiseizure medications prophylactically in the setting of brain metastases, we will not start antiseizure medications at this time.  Appreciate Dr. Renda Rolls support in following the patient on an outpatient basis; communication with him completed via secure chat.  Appreciate neurosurgery recommendations regarding dexamethasone and defer to their management for taper.  Neurology will be available on an as-needed basis going forward.  Please reach out if new concerns or questions arise.  Lesleigh Noe MD-PhD [facility] [phone] Available 7 AM to 7 PM, outside these hours please contact Neurologist on call listed on AMION

## 2021-01-23 NOTE — Progress Notes (Addendum)
    Request received for liver lesion biopsy per Dr Christianne Borrow  Images reviewed with Dr Kathlene Cote Indeterminate lesion noted Before proceeding with biopsy Would recommend MRI with and without contrast to fully characterize this lesion.  Will inform ordering MD  Will await MRI results And can re evaluate then if appropriate for biopsy

## 2021-01-23 NOTE — Progress Notes (Signed)
EEG complete - results pending 

## 2021-01-23 NOTE — Plan of Care (Signed)

## 2021-01-23 NOTE — Plan of Care (Signed)
  Problem: Education: Goal: Knowledge of General Education information will improve Description Including pain rating scale, medication(s)/side effects and non-pharmacologic comfort measures Outcome: Progressing   

## 2021-01-23 NOTE — Progress Notes (Signed)
FPTS Brief Progress Note  S Saw patient at bedside this evening. Patient was in bed texting. Denies concerns this evening.   O: BP (!) 141/84 (BP Location: Right Arm)   Pulse 80   Temp (!) 97.4 F (36.3 C) (Oral)   Resp 18   Ht '6\' 2"'$  (1.88 m)   Wt 111.1 kg   SpO2 92%   BMI 31.46 kg/m    General: alert, comfortable  A/P: Plan per day team  -F/u MRI liver results -NPO MN -Possible liver biopsy am  - Orders reviewed. Labs for AM not ordered, which was adjusted as needed.    Lattie Haw, MD 01/23/2021, 8:12 PM PGY-3, [facility] Night Resident  Please page [phone] with questions.

## 2021-01-23 NOTE — Procedures (Signed)
Patient Name: Jason Snyder  MRN: [id number]  Epilepsy Attending: Lora Havens  Referring Physician/Provider: Myra Rude, NP Date: 01/23/2021 Duration: 22.22 mins  Patient history:  70 y.o. male with PMHx of CAD, HLD, DM2, COPD, HTN, OSA on CPAP, obesity, remote smoking history who presented to the ED 01/22/2021 for evaluation of right-sided weakness, numbness, and paresthesias. CTH was obtained revealing a left parietal mass with extensive vasogenic edema.  EEG to evaluate for seizures.  Level of alertness: Awake, asleep  AEDs during EEG study: GBP  Technical aspects: This EEG study was done with scalp electrodes positioned according to the 10-20 International system of electrode placement. Electrical activity was acquired at a sampling rate of '500Hz'$  and reviewed with a high frequency filter of '70Hz'$  and a low frequency filter of '1Hz'$ . EEG data were recorded continuously and digitally stored.   Description: The posterior dominant rhythm consists of 7.5 Hz activity of moderate voltage (25-35 uV) seen predominantly in posterior head regions, symmetric and reactive to eye opening and eye closing. Sleep was characterized by vertex waves, sleep spindles (12 to 14 Hz), maximal frontocentral region. EEG showed continuous generalized and maximal left temporoparietal 5-6 Hz theta slowing.  Intermittent left temporoparietal 2 to 3 Hz sharply contoured and rhythmic delta slowing was also noted.  Hyperventilation and photic stimulation were not performed.     ABNORMALITY - Continuous slow, generalized and maximal left temporoparietal region -Intermittent rhythmic slow, left temporoparietal region  IMPRESSION: This study showed rhythmic delta activity in left temporoparietal region which is on the ictal-interictal continuum with low potential for seizures. There is also cortical dysfunction in left temporoparietal region likely secondary to underlying structural abnormality/ mass. Additionally there is  mild diffuse encephalopathy, nonspecific etiology.  No seizures or definite epileptiform discharges were seen throughout the recording.   Jason Snyder [name]

## 2021-01-24 ENCOUNTER — Other Ambulatory Visit: Payer: Self-pay

## 2021-01-24 ENCOUNTER — Inpatient Hospital Stay (HOSPITAL_COMMUNITY): Payer: Medicare Other

## 2021-01-24 ENCOUNTER — Encounter (HOSPITAL_COMMUNITY): Payer: Self-pay | Admitting: Student

## 2021-01-24 DIAGNOSIS — G9389 Other specified disorders of brain: Secondary | ICD-10-CM | POA: Diagnosis not present

## 2021-01-24 LAB — PSA: Prostatic Specific Antigen: 1.28 ng/mL (ref 0.00–4.00)

## 2021-01-24 LAB — GLUCOSE, CAPILLARY
Glucose-Capillary: 158 mg/dL — ABNORMAL HIGH (ref 70–99)
Glucose-Capillary: 170 mg/dL — ABNORMAL HIGH (ref 70–99)
Glucose-Capillary: 146 mg/dL — ABNORMAL HIGH (ref 70–99)
Glucose-Capillary: 136 mg/dL — ABNORMAL HIGH (ref 70–99)
Glucose-Capillary: 109 mg/dL — ABNORMAL HIGH (ref 70–99)

## 2021-01-24 MED ORDER — MIDAZOLAM HCL 2 MG/2ML IJ SOLN
INTRAMUSCULAR | Status: AC
  Filled 2021-01-24: qty 2

## 2021-01-24 MED ORDER — ALBUTEROL SULFATE (2.5 MG/3ML) 0.083% IN NEBU: 2.5000 mg | INHALATION_SOLUTION | Freq: Four times a day (QID) | RESPIRATORY_TRACT | Status: DC | PRN

## 2021-01-24 MED ORDER — FENTANYL CITRATE (PF) 100 MCG/2ML IJ SOLN
INTRAMUSCULAR | Status: AC
  Filled 2021-01-24: qty 2

## 2021-01-24 MED ORDER — GELATIN ABSORBABLE 12-7 MM EX MISC
CUTANEOUS | Status: AC
  Filled 2021-01-24: qty 1

## 2021-01-24 MED ORDER — FENTANYL CITRATE (PF) 100 MCG/2ML IJ SOLN
INTRAMUSCULAR | Status: AC | PRN
  Administered 2021-01-24 (×2): 25 ug via INTRAVENOUS

## 2021-01-24 MED ORDER — DEXAMETHASONE 2 MG PO TABS
2.0000 mg | ORAL_TABLET | Freq: Two times a day (BID) | ORAL | Status: DC
  Administered 2021-01-24 – 2021-01-26 (×4): 2 mg via ORAL
  Filled 2021-01-24 (×4): qty 1

## 2021-01-24 MED ORDER — LIDOCAINE HCL (PF) 1 % IJ SOLN
INTRAMUSCULAR | Status: AC
  Filled 2021-01-24: qty 30

## 2021-01-24 MED ORDER — MIDAZOLAM HCL 2 MG/2ML IJ SOLN
INTRAMUSCULAR | Status: AC | PRN
  Administered 2021-01-24: 1 mg via INTRAVENOUS

## 2021-01-24 MED ORDER — HEPARIN SODIUM (PORCINE) 5000 UNIT/ML IJ SOLN
5000.0000 [IU] | Freq: Three times a day (TID) | INTRAMUSCULAR | Status: AC
  Administered 2021-01-25: 5000 [IU] via SUBCUTANEOUS
  Filled 2021-01-24: qty 1

## 2021-01-24 NOTE — Evaluation (Signed)
Physical Therapy Evaluation Patient Details Name: Jason Snyder MRN: [id number] DOB: 1950/06/23 Today's Date: 01/24/2021   History of Present Illness  Pt is a 70 y.o. male who presented 01/22/21 with R-sided weakness and numbness that has progressively worsened since the prior week. CT head noted left parietal mass most concerning for metastasis.  MRI notes 1.9 cm mass which additionally favors metastatic disease or primary neoplasm. CT abdomen notes hypodensity within the right lobe of the liver which is concerning for neoplasm given intracranial findings. S/p US guided liver mass biopsy 8/23. PMH: CAD, DM2, COPD, HTN   Clinical Impression  Pt presents with condition above and deficits mentioned below, see PT Problem List. PTA, he was independent with all functional mobility and living with his wife on the 2nd floor of their house. He currently displays deficits in R UE and lower extremity strength, coordination, and sensation that impact his balance. He is at risk for falls. Educated pt on increasing R foot clearance with thresholds, carpets and stairs. Pt is able to perform all functional mobility at a min guard assist level with UE support. Recommending follow-up with skilled PT at a neuro outpatient clinic to maximize his safety and independence with all functional mobility. Will continue to follow acutely.    Follow Up Recommendations Outpatient PT;Supervision for mobility/OOB (neuro specialty clinic)    Equipment Recommendations  Rolling walker with 5" wheels    Recommendations for Other Services Speech consult     Precautions / Restrictions Precautions Precautions: Fall Restrictions Weight Bearing Restrictions: No      Mobility  Bed Mobility Overal bed mobility: Independent             General bed mobility comments: Pt able to transition supine > sit R EOB with bed flat without assistance but extra time.    Transfers Overall transfer level: Needs assistance Equipment  used: Rolling walker (2 wheeled) Transfers: Sit to/from Stand Sit to Stand: Min guard         General transfer comment: Min guard assist for safety, no LOB, cues for hand placement.  Ambulation/Gait Ambulation/Gait assistance: Min guard Gait Distance (Feet): 250 Feet Assistive device: Rolling walker (2 wheeled);Standard walker;1 person hand held assist;Straight cane Gait Pattern/deviations: Step-through pattern;Decreased stride length;Decreased dorsiflexion - right Gait velocity: reduced Gait velocity interpretation: 1.31 - 2.62 ft/sec, indicative of limited community ambulator General Gait Details: Initially ambulating with standard walker, progressing to L HHA with no LOB. Attempted to use SPC in L hand but difficulty sequencing, needing cues to keep cane with R foot and step through with L. Thus, changed to RW with steady, fluid gait and increased speed. Min guard for safety. As he fatigues, his R foto clearance decreases.  Stairs Stairs: Yes Stairs assistance: Min guard Stair Management: One rail Left;One rail Right;Step to pattern;Forwards Number of Stairs: 8 General stair comments: Ascends with L rail and descends with R rail with step-to gait pattern, no LOB, extra time, min guard for safety. Educated pt to lead up with L (strong) leg and down with R (weaker) leg for safety but can do opposite at bottom step for strengthening R leg. Educated pt to have supervision on stairs for safety and to increase R toe clearance.  Wheelchair Mobility    Modified Rankin (Stroke Patients Only) Modified Rankin (Stroke Patients Only) Pre-Morbid Rankin Score: No symptoms Modified Rankin: Moderately severe disability     Balance Overall balance assessment: Needs assistance Sitting-balance support: No upper extremity supported;Feet supported Sitting balance-Leahy Scale: Good  Standing balance support: Bilateral upper extremity supported;Single extremity supported;During functional  activity Standing balance-Leahy Scale: Poor Standing balance comment: UE support for mobility.                             Pertinent Vitals/Pain Pain Assessment: 0-10 Pain Score: 4  Pain Location: low back Pain Descriptors / Indicators: Aching Pain Intervention(s): Limited activity within patient's tolerance;Monitored during session;Repositioned    Home Living Family/patient expects to be discharged to:: Private residence Living Arrangements: Spouse/significant other Available Help at Discharge: Family;Available 24 hours/day Type of Home: House Home Access: Stairs to enter Entrance Stairs-Rails: None Entrance Stairs-Number of Steps: 6 Home Layout: Two level;Bed/bath upstairs;1/2 bath on main level Home Equipment: Cane - single point;Grab bars - tub/shower Additional Comments: Just recently bought [facility] when R sided deficits began, but pt was struggling with it.    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     Hand Dominance   Dominant Hand: Right    Extremity/Trunk Assessment   Upper Extremity Assessment Upper Extremity Assessment: RUE deficits/detail RUE Deficits / Details: Gross weakness with MMT scores of 3- shoulder flexion, 4- biceps, 4- triceps, and about symmetrical grip strength to L; dysmetria and dysdiadochokinesia noted; decreased sensation compared to L throughout RUE Sensation: decreased light touch RUE Coordination: decreased fine motor;decreased gross motor    Lower Extremity Assessment Lower Extremity Assessment: RLE deficits/detail RLE Deficits / Details: Gross weakness with MMT scores of 4 hip flexion, 4+ knee extension, 4 knee flexion, 4 ankle dorsiflexion; dysmetria and dysdiadochokinesia noted; decreased sensation compared to L throughout RLE Sensation: decreased light touch RLE Coordination: decreased fine motor;decreased gross motor    Cervical / Trunk  Assessment Cervical / Trunk Assessment: Normal  Communication   Communication: No difficulties  Cognition Arousal/Alertness: Awake/alert Behavior During Therapy: WFL for tasks assessed/performed Overall Cognitive Status: Impaired/Different from baseline Area of Impairment: Memory                     Memory:  (difficulty recalling words)         General Comments: Pt reporting increased difficulty recalling words and a change in cognition, but follows commands appropriately and maintains safety during session.      General Comments      Exercises     Assessment/Plan    PT Assessment Patient needs continued PT services  PT Problem List Decreased strength;Decreased range of motion;Decreased activity tolerance;Decreased balance;Decreased mobility;Decreased coordination;Decreased cognition;Decreased knowledge of use of DME;Decreased safety awareness;Impaired sensation       PT Treatment Interventions DME instruction;Gait training;Stair training;Functional mobility training;Therapeutic exercise;Therapeutic activities;Balance training;Neuromuscular re-education;Cognitive remediation;Patient/family education    PT Goals (Current goals can be found in the Care Plan section)  Acute Rehab PT Goals Patient Stated Goal: to improve PT Goal Formulation: With patient/family Time For Goal Achievement: 02/07/21 Potential to Achieve Goals: Good    Frequency Min 4X/week   Barriers to discharge        Co-evaluation               AM-PAC PT "6 Clicks" Mobility  Outcome Measure Help needed turning from your back to your side while in a flat bed without using bedrails?: None Help needed moving from lying on your back to sitting on the side of a flat bed without using bedrails?: None Help needed moving to and from  a bed to a chair (including a wheelchair)?: A Little Help needed standing up from a chair using your arms (e.g., wheelchair or bedside chair)?: A Little Help needed  to walk in hospital room?: A Little Help needed climbing 3-5 steps with a railing? : A Little 6 Click Score: 20    End of Session Equipment Utilized During Treatment: Gait belt Activity Tolerance: Patient tolerated treatment well Patient left: in chair;with call bell/phone within reach;with chair alarm set;with family/visitor present Nurse Communication: Mobility status PT Visit Diagnosis: Unsteadiness on feet (R26.81);Other abnormalities of gait and mobility (R26.89);Muscle weakness (generalized) (M62.81);History of falling (Z91.81);Difficulty in walking, not elsewhere classified (R26.2);Other symptoms and signs involving the nervous system (R29.898)    Time: [id number] PT Time Calculation (min) (ACUTE ONLY): 38 min   Charges:   PT Evaluation $PT Eval Moderate Complexity: 1 Mod PT Treatments $Gait Training: 23-37 mins        Moishe Spice, PT, DPT Acute Rehabilitation Services  Pager: [phone] Office: [place] 01/24/2021, 3:53 PM

## 2021-01-24 NOTE — Progress Notes (Signed)
FPTS Brief Progress Note  S Saw patient at bedside this evening. Patient was in bed watching a video on his phone. He is feeling fine after his liver biopsy. Denies pain. Explained MRI liver results. He asked some questions about re-imaging of his brain for the swelling. I said he will likely have further brain imaging at some point but this would be determined by the neurologist and neurosurgery. Pt was grateful the update.   O: BP (!) 154/87 (BP Location: Right Arm)   Pulse 79   Temp 97.6 F (36.4 C) (Oral)   Resp 19   Ht '6\' 2"'$  (1.88 m)   Wt 111.1 kg   SpO2 94%   BMI 31.46 kg/m    General: Alert, no acute distress, pleasant  Cardio: well perfused  Pulm: normal work of breathing Abdomen: liver biopsy site clean and dry  Neuro: Cranial nerves grossly intact   A/P: Plan per day team  - Orders reviewed. Labs for AM not ordered, which was adjusted as needed.  Lattie Haw, MD 01/24/2021, 10:27 PM PGY-3, [facility] Night Resident  Please page [phone] with questions.

## 2021-01-24 NOTE — Progress Notes (Signed)
Inpatient Rehab Admissions Coordinator:   Pt.'s dx of brain mass with right sided weakness may make her a potential CIR candidate; however, she does not have orders for PT/OT. Request attending service place PT/OT consults so that her discharge needs can be accurately assessed. Teaching service paged but I have not received a response.   Clemens Catholic, [place], [place] Admissions Coordinator  [phone] ([place]) [phone] (office)

## 2021-01-24 NOTE — Progress Notes (Signed)
Family Medicine Teaching Service Daily Progress Note Intern Pager: [phone]  Patient name: Jason Snyder Medical record number: [id number] Date of birth: September 18, 1950 Age: 70 y.o. Gender: male  Primary Care Provider: [name], MD Consultants: Neurosurgery, IR, neurology Code Status: Full  Pt Overview and Major Events to Date:  8/22: admitted, imaging revealed neoplasm of liver and possible brain met 8/24: liver MRI showed multiple hepatic lesions, liver neoplasm biopsy  Assessment and Plan: Jason Snyder is a 70 y/o M presenting with right-sided weakness and numbness.  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  CT head noted left parietal mass concerning for metastasis, supported by MRI.  CT abdomen noted hypodensity within the right lobe of liver concerning for primary neoplasm.  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  Small lymph nodes in the area in the GE junction.  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  Patient is received dexamethasone per neurosurgery recommendations. - DC heparin, start lovenox this evening - DC neuro checks -PT/OT continue to eval/treat -Neurosurgery following, appreciate recommendations -Continue dexamethasone 2 mg twice daily per neurosurgery recommendations -Liver biopsy results pending - Consult GI   HTN: Chronic, stable Last BP 153/86. BP stable at this time.  -Continue metoprolol tartrate 50 mg twice daily -Continue to hold losartan HCTZ and amlodipine to avoid hypotension in setting of cerebral edema  T2DM Last Hgb A1c on 04/11/20: 6.2%. Last glucose was 147. 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  Continue to be prescribed by PCP.  He only uses them for 5 to 6 days at a time when he has flares.  He had a flare during admission. -Changed Ciprodex to as needed   FEN/GI: Regular PPx: Start Lovenox at [facility] pending GI consult.  Clinically stable at this time.  Subjective:  Patient states he has been having headaches for the past couple days.  He does not normally have headaches.  He states he would also appreciate if someone from the medical staff will be able to speak to him and his wife when she visits as he is concerned about how she will take the news.  Objective: Temp:  [97.6 F (36.4 C)-98.3 F (36.8 C)] 98.2 F (36.8 C) (08/25 0341) Pulse Rate:  [64-84] 66 (08/25 0621) Resp:  [15-21] 18 (08/25 0621) BP: (107-154)/(57-105) 153/86 (08/25 0621) SpO2:  [93 %-97 %] 93 % (08/25 0818) Physical Exam: General: Awake, alert, conversational, no acute distress Cardiovascular: RRR, no murmurs auscultated  Respiratory: CTA B, no increased work of breathing Abdomen: soft, nondistended, nontender in all 4 quadrants Extremities: 4/5 strength of right upper extremity, numbness of right upper extremity compared to left  Laboratory: Recent Labs  Lab 01/22/21 1219 01/23/21 0346  WBC 7.5 9.6  HGB 17.0 16.6  HCT 50.8 49.0  PLT 251 232   Recent Labs  Lab 01/22/21 1219 01/23/21 0346  NA 136 135  K 4.0 4.0  CL 97* 99  CO2 26 27  BUN 16 18  CREATININE 1.09 1.11  CALCIUM 9.6 9.2  PROT  7.3  --   BILITOT 1.3*  --   ALKPHOS 58  --   ALT 26  --   AST 23  --   GLUCOSE 126* 152*   CBG (last 3)  Recent Labs    01/24/21 1721 01/24/21 2115 01/25/21 0644  GLUCAP 136* 109* 147*    Imaging/Diagnostic Tests: No new imaging/diagnostic tests at this time.  Wells Guiles, DO 01/25/2021, 9:25 AM PGY-1, [place] Intern pager: [phone], text pages welcome

## 2021-01-24 NOTE — Evaluation (Addendum)
Occupational Therapy Evaluation Patient Details Name: Jason Snyder MRN: [id number] DOB: 11-16-1950 Today's Date: 01/24/2021    History of Present Illness Pt is a 70 y.o. male who presented 01/22/21 with R-sided weakness and numbness that has progressively worsened since the prior week. CT head noted left parietal mass most concerning for metastasis.  MRI notes 1.9 cm mass which additionally favors metastatic disease or primary neoplasm. CT abdomen notes hypodensity within the right lobe of the liver which is concerning for neoplasm given intracranial findings. S/p US guided liver mass biopsy 8/23. PMH: CAD, DM2, COPD, HTN   Clinical Impression   Patient admitted for the above diagnosis.  PTA he was very independent with all self care and mobility.  Over the past 3 weeks he began to experience R sided weakness and admits to a fall.  Deficits are listed below.  Currently he is needing Mod I with all in room mobility and ADL completion from sit/stand level.  Clock drawing test completed, patient is demonstrating some visual spatial deficits.  OT to follow in the acute setting to maximize functional status.  Recommend outpatient neuro specialist exam if patient agrees.       Follow Up Recommendations  No OT follow up .  Further neuro cognitive testing.     Equipment Recommendations  None recommended by OT    Recommendations for Other Services       Precautions / Restrictions Precautions Precautions: Fall Restrictions Weight Bearing Restrictions: No      Mobility Bed Mobility Overal bed mobility: Independent             General bed mobility comments: up in the recliner Patient Response: Cooperative  Transfers Overall transfer level: Modified independent Equipment used: Rolling walker (2 wheeled) Transfers: Sit to/from Stand Sit to Stand: Min guard         General transfer comment: no AD in the room, but reaching for objects in his environment.    Balance Overall balance  assessment: Mild deficits observed, not formally tested Sitting-balance support: No upper extremity supported;Feet supported Sitting balance-Leahy Scale: Good     Standing balance support: Bilateral upper extremity supported;Single extremity supported;During functional activity Standing balance-Leahy Scale: Poor Standing balance comment: UE support for mobility.                           ADL either performed or assessed with clinical judgement   ADL Overall ADL's : At baseline                                       General ADL Comments: increased time due to R hemi weakness, but no assistance needed     Vision Baseline Vision/History: 1 Wears glasses Ability to See in Adequate Light: 0 Adequate Patient Visual Report: No change from baseline       Perception     Praxis      Pertinent Vitals/Pain Pain Assessment: No/denies pain Pain Score: 4  Pain Location: low back Pain Descriptors / Indicators: Aching Pain Intervention(s): Monitored during session     Hand Dominance Right   Extremity/Trunk Assessment Upper Extremity Assessment Upper Extremity Assessment: RUE deficits/detail RUE Deficits / Details: Gross weakness with MMT scores of 3- shoulder flexion, 4- biceps, 4- triceps, and about symmetrical grip strength to L; dysmetria and dysdiadochokinesia noted; decreased sensation compared to L throughout RUE Sensation: decreased light  touch RUE Coordination: decreased fine motor;decreased gross motor   Lower Extremity Assessment Lower Extremity Assessment: Defer to PT evaluation RLE Deficits / Details: Gross weakness with MMT scores of 4 hip flexion, 4+ knee extension, 4 knee flexion, 4 ankle dorsiflexion; dysmetria and dysdiadochokinesia noted; decreased sensation compared to L throughout RLE Sensation: decreased light touch RLE Coordination: decreased fine motor;decreased gross motor   Cervical / Trunk Assessment Cervical / Trunk Assessment:  Normal   Communication Communication Communication: No difficulties   Cognition Arousal/Alertness: Awake/alert Behavior During Therapy: WFL for tasks assessed/performed Overall Cognitive Status: Impaired/Different from baseline Area of Impairment: Problem solving                     Memory:  (difficulty recalling words)       Problem Solving: Difficulty sequencing;Requires verbal cues General Comments: patient demonstrates some visual spatial deficts related to clock drawing.   General Comments  Able to walk in the room without AD and no overt LOB noted    Exercises     Shoulder Instructions      Home Living Family/patient expects to be discharged to:: Private residence Living Arrangements: Spouse/significant other Available Help at Discharge: Family;Available 24 hours/day Type of Home: House Home Access: Stairs to enter [employer] of Steps: 6 Entrance Stairs-Rails: None Home Layout: Two level;Bed/bath upstairs;1/2 bath on main level Alternate Level Stairs-Number of Steps: flight Alternate Level Stairs-Rails: Left Bathroom Shower/Tub: Teacher, early years/pre: Standard     Home Equipment: Cane - single point;Grab bars - tub/shower   Additional Comments: Just recently bought SPC when R sided deficits began, but pt was struggling with it.      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        OT Problem List: Impaired balance (sitting and/or standing);Impaired vision/perception      OT Treatment/Interventions: Self-care/ADL training;Therapeutic activities;Visual/perceptual remediation/compensation    OT Goals(Current goals can be found in the care plan section) Acute Rehab OT Goals Patient Stated Goal: to figure out next steps OT Goal Formulation: With patient Time For Goal Achievement: 02/07/21 Potential to Achieve Goals: Good  OT  Frequency: Min 2X/week   Barriers to D/C:            Co-evaluation              AM-PAC OT "6 Clicks" Daily Activity     Outcome Measure Help from another person eating meals?: None Help from another person taking care of personal grooming?: None Help from another person toileting, which includes using toliet, bedpan, or urinal?: None Help from another person bathing (including washing, rinsing, drying)?: None Help from another person to put on and taking off regular upper body clothing?: None Help from another person to put on and taking off regular lower body clothing?: None 6 Click Score: 24   End of Session    Activity Tolerance: Patient tolerated treatment well Patient left: in chair;with call bell/phone within reach  OT Visit Diagnosis: Unsteadiness on feet (R26.81)                Time: 1630-1700 OT Time Calculation (min): 30 min Charges:  OT General Charges $OT Visit: 1 Visit OT Evaluation $OT Eval Moderate Complexity: 1 Mod OT Treatments $Self Care/Home Management : 8-22 mins  01/24/2021  RP, OTR/L  Acute Rehabilitation Services  Office:  [phone]   Metta Clines 01/24/2021, 5:15  PM

## 2021-01-24 NOTE — Consult Note (Signed)
Chief Complaint: Patient was seen in consultation today for Liver lesion biopsy    Referring Physician(s): Dr. Jerilynn Mages. Chambliss  Supervising Physician: Aletta Edouard  Patient Status: [facility] - In-pt  History of Present Illness:  Jason Snyder is a 70 y.o. male past medical history CAD, diabetes, hyperlipidemia, COPD and GERD.  Patient presented to the ED complaining of right arm and right leg weakness 8/22 and tingling on the right side about 2 weeks prior.  He also reported difficulty walking. MRI of the liver on 8/24 showed hepatic lesions and lesion to head of pancreas. MRI brain on 8/22 found patient had probable metastatic disease. Patient referred by Dr. Erin Hearing  today for liver lesion biopsy.  Patient denies fever, chills, chest pain, shortness of breath, nausea, vomiting.  Patient found to be sitting upright in bed. Patient is pleasant alert and oriented. Patient reports nothing to eat or drink since last night per order.  Patient reports steroids that he has been given has helped to resolve his difficulty walking and hand numbness.  Past Medical History:  Diagnosis Date   BMI 30.0-30.9,adult    BPH without obstruction/lower urinary tract symptoms 10/03/2017   CAD (coronary artery disease)    cath 2020, single vessel disease, medical mgt   Chronic left shoulder pain c   Chronic low back pain with right-sided sciatica    Chronic pain 10/07/2013   Combined hyperlipidemia associated with type 2 diabetes mellitus ([place]) 07/09/2019   Controlled type 2 diabetes mellitus without complication, without long-term current use of insulin ([place]) 07/12/2014   COPD (chronic obstructive pulmonary disease) ([place]) 01/07/2014   PFTs 07/2018 minimal airway obstruction, overinflation and no response to bronchodilators.    Coronary artery disease involving native coronary artery of native heart without angina pectoris    Cardiac Cath 01/2019: Severe single-vessel disease with additional moderate two-vessel  disease: 100% CTO of RCA, moderate disease in proximal LCx, distal LCx and very small caliber 1st Diag DFR-FFR negative lesion in proximal LCx Normal LV pressures with previously document, ed normal EF.   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   Obesity (BMI 30-39.9)    Obesity (BMI 30.0-34.9) 10/03/2017   OSA on CPAP 10/07/2013   Primary osteoarthritis involving multiple joints 01/10/2015   Rosacea    Smoking greater than 30 pack years - quit 03/2018 10/03/2017   Thoracic and lumbosacral neuritis    Type 2 diabetes mellitus with peripheral neuropathy ([place]) 07/12/2014    Past Surgical History:  Procedure Laterality Date   ARTHROSCOPIC REPAIR ACL     CARDIAC CATHETERIZATION     INTRAVASCULAR PRESSURE WIRE/FFR STUDY N/A 01/01/2019   Procedure: INTRAVASCULAR PRESSURE WIRE/FFR STUDY;  Surgeon: Leonie Man, MD;  Location: [facility];  Service: Cardiovascular;  Laterality: N/A;   LEFT HEART CATH AND CORONARY ANGIOGRAPHY N/A 01/01/2019   Procedure: LEFT HEART CATH AND CORONARY ANGIOGRAPHY;  Surgeon: Leonie Man, MD;  Location: [facility];  Service: Cardiovascular;  Laterality: N/A;  PROSTATE CRYOABLATION     03/2019   SHOULDER ARTHROSCOPY     SPINE SURGERY      Allergies: Rubus fruticosus  Medications: Prior to Admission medications   Medication Sig Start Date End Date Taking? Authorizing Provider  acetaminophen-codeine (TYLENOL #3) 300-30 MG tablet Take 1 tablet by  mouth 2 (two) times daily as needed for moderate pain. 01/11/21  Yes [name], MD  acetic acid 2 % otic solution Place 4 drops into both ears 2 (two) times daily as needed. Patient taking differently: Place 4 drops into both ears 2 (two) times daily as needed (pain). 10/06/19  Yes [name], MD  albuterol (VENTOLIN HFA) 108 (90 Base) MCG/ACT inhaler Inhale 2 puffs by mouth into the lungs every 4 hours as needed for wheezing 12/05/20  Yes [name], MD  amLODipine (NORVASC) 10 MG tablet TAKE ONE TABLET BY MOUTH ONE TIME DAILY Patient taking differently: Take 10 mg by mouth daily. 11/17/20  Yes [name], MD  aspirin 81 MG EC tablet Take 81 mg by mouth daily.    Yes [provider]  atorvastatin (LIPITOR) 20 MG tablet Take 1 tablet (20 mg total) by mouth at bedtime. 07/12/20  Yes [name], MD  ciprofloxacin-dexamethasone (CIPRODEX) OTIC suspension Place 4 drops into both ears 2 (two) times daily.   Yes [provider]  cyclobenzaprine (FLEXERIL) 10 MG tablet Take 1 tablet (10 mg total) by mouth 3 (three) times daily as needed for muscle spasms. 07/12/20  Yes [name], MD  diclofenac sodium (VOLTAREN) 1 % GEL Apply 2 g topically 4 (four) times daily as needed (pain). 04/21/17  Yes [provider]  FARXIGA 10 MG TABS tablet TAKE ONE TABLET BY MOUTH ONE TIME DAILY Patient taking differently: Take 10 mg by mouth daily. 08/28/20  Yes [name], MD  fluticasone [facility]) 50 MCG/ACT nasal spray Place 1 spray into the nose daily as needed for allergies.  04/09/13  Yes [provider]  gabapentin (NEURONTIN) 300 MG capsule Take 1 capsule (300 mg total) by mouth 3 (three) times daily. 01/11/21  Yes [name], MD  ibuprofen (ADVIL) 200 MG tablet Take 200 mg by mouth every 6 (six) hours as needed for mild pain.   Yes [provider]  Loratadine 10 MG CAPS Take 10 mg by mouth daily as needed (allergies).    Yes [provider]  losartan-hydrochlorothiazide (HYZAAR) 100-25 MG tablet TAKE ONE TABLET BY MOUTH ONE TIME DAILY 10/26/20  Yes [name], MD  metFORMIN (GLUCOPHAGE) 500 MG tablet Take 1 tablet (500 mg total) by mouth 2 (two) times daily with a meal. 01/11/21  Yes [name], MD  metoprolol tartrate (LOPRESSOR) 50 MG tablet Take 1 tablet (50 mg total) by mouth 2 (two) times daily. 04/11/20  Yes [name], MD  Multiple Vitamin (MULTIVITAMIN) capsule Take 1 capsule by mouth daily.    Yes [provider]  nitroGLYCERIN (NITROSTAT) 0.4 MG SL tablet Place 1 tablet (0.4 mg total) under the tongue every 5 (five) minutes as needed for chest pain. 08/28/20  Yes Richardo Priest, MD  Spacer/Aero-Holding Chambers (AEROCHAMBER MV) inhaler Use as instructed 11/20/20  Yes Magdalen Spatz, NP  tamsulosin (FLOMAX) 0.4 MG CAPS capsule TAKE TWO CAPSULES BY MOUTH DAILY Patient taking differently: Take 0.4 mg by mouth daily. 03/02/20  Yes [name], MD  testosterone cypionate (DEPOTESTOSTERONE CYPIONATE) 200 MG/ML injection INJECT 1ML INTO  THE MUSCLE EVERY 14 DAYS Patient taking differently: Inject 200 mg into the muscle once a week. Friday 03/24/19  Yes [name], MD  Tiotropium Bromide-Olodaterol (STIOLTO RESPIMAT) 2.5-2.5 MCG/ACT AERS Inhale 2 puffs into the lungs daily. 10/10/20  Yes Mannam, Praveen, MD  Ketotifen Fumarate (ALLERGY EYE DROPS OP) Place 1 drop into both eyes daily as needed (allergy).    [provider]     Family History  Problem Relation Age of Onset   Pulmonary embolism Mother    Heart disease Father    Lung cancer Father    Lung cancer Sister    Heart disease Maternal Uncle     Social History   Socioeconomic History   Marital status: Married    Spouse name: Not on file   Number of children: Not on file   Years of education: Not on file   Highest education level: Not on file  Occupational History   Occupation: retired  Tobacco Use   Smoking status: Former     Packs/day: 0.50    Years: 30.00    Pack years: 15.00    Types: Cigarettes    Quit date: 03/05/2018    Years since quitting: 2.8   Smokeless tobacco: Never   Tobacco comments:    no cigarettes since 03/05/18  Vaping Use   Vaping Use: Never used  Substance and Sexual Activity   Alcohol use: Not Currently   Drug use: Not Currently   Sexual activity: Not Currently  Other Topics Concern   Not on file  Social History Narrative   Not on file   Social Determinants of Health   Financial Resource Strain: Low Risk    Difficulty of Paying Living Expenses: Not hard at all  Food Insecurity: No Food Insecurity   Worried About Charity fundraiser in the Last Year: Never true   [place] in the Last Year: Never true  Transportation Needs: No Transportation Needs   Lack of Transportation (Medical): No   Lack of Transportation (Non-Medical): No  Physical Activity: Insufficiently Active   Days of Exercise per Week: 7 days   Minutes of Exercise per Session: 20 min  Stress: No Stress Concern Present   Feeling of Stress : Not at all  Social Connections: Moderately Isolated   Frequency of Communication with Friends and Family: More than three times a week   Frequency of Social Gatherings with Friends and Family: Once a week   Attends Religious Services: Never   Marine scientist or Organizations: No   Attends Music therapist: Never   Marital Status: Married     Review of Systems: A 12 point ROS discussed and pertinent positives are indicated in the HPI above.  All other systems are negative.  Review of Systems  Constitutional:  Negative for chills and fever.  Respiratory:  Negative for shortness of breath.        Pt reports he received his ordered breathign tx today. He uses inhalers at home for COPD  Cardiovascular:  Negative for chest pain.  Gastrointestinal:  Negative for abdominal pain, diarrhea and nausea.  Neurological:        Patient reports difficulty  walking and hand numbness has resolved with steroid treatment while inpatient.   Vital Signs: BP 124/80 (BP Location: Right Arm)   Pulse 80   Temp 97.7 F (36.5 C) (Oral)   Resp 20   Ht '6\' 2"'$  (1.88 m)   Wt 245 lb (111.1  kg)   SpO2 92%   BMI 31.46 kg/m   Physical Exam Constitutional:      Appearance: Normal appearance. He is not ill-appearing or diaphoretic.  HENT:     Head: Normocephalic and atraumatic.     Mouth/Throat:     Mouth: Mucous membranes are moist.     Pharynx: Oropharynx is clear.  Cardiovascular:     Rate and Rhythm: Normal rate and regular rhythm.     Pulses: Normal pulses.     Heart sounds: Normal heart sounds. No murmur heard. Pulmonary:     Breath sounds: Normal breath sounds. No stridor. No wheezing, rhonchi or rales.  Abdominal:     General: Bowel sounds are normal. There is distension.     Tenderness: There is no abdominal tenderness. There is no guarding.  Musculoskeletal:     Right lower leg: No edema.     Left lower leg: No edema.  Skin:    General: Skin is warm and dry.  Neurological:     Mental Status: He is alert and oriented to person, place, and time. Mental status is at baseline.  Psychiatric:        Mood and Affect: Mood normal.        Behavior: Behavior normal.        Thought Content: Thought content normal.        Judgment: Judgment normal.    Imaging: CT HEAD WO CONTRAST  Result Date: 01/22/2021 CLINICAL DATA:  Neuro deficit, acute, stroke suspected. Right arm and leg numbness. EXAM: CT HEAD WITHOUT CONTRAST TECHNIQUE: Contiguous axial images were obtained from the base of the skull through the vertex without intravenous contrast. COMPARISON:  None. FINDINGS: Brain: There is a 1.3 cm mass in the left parietal lobe with extensive surrounding vasogenic edema. The mass is heterogeneously hyperdense which may reflect hypercellularity or hemorrhage. There is regional sulcal effacement and mass effect on the left lateral ventricle without  midline shift. No second lesion is identified on this unenhanced study. No acute cortically based infarct or extra-axial fluid collection is evident. Vascular: Calcified atherosclerosis at the skull base. No hyperdense vessel. Skull: No fracture or suspicious osseous lesion. Sinuses/Orbits: Mild right frontal and ethmoid sinus mucosal thickening. Trace left mastoid effusion. Unremarkable orbits. Other: None. IMPRESSION: Left parietal mass with extensive vasogenic edema most concerning for a metastasis. Brain MRI without and with contrast is recommended for further evaluation. These results were called by telephone at the time of interpretation on 01/22/2021 at 1:56 pm to provider Alyse Low, who verbally acknowledged these results. Electronically Signed   By: Logan Bores M.D.   On: 01/22/2021 13:57   CT Chest W Contrast  Result Date: 01/22/2021 CLINICAL DATA:  Intracranial metastases without known primary malignancy EXAM: CT CHEST, ABDOMEN, AND PELVIS WITH CONTRAST TECHNIQUE: Multidetector CT imaging of the chest, abdomen and pelvis was performed following the standard protocol during bolus administration of intravenous contrast. CONTRAST:  191m OMNIPAQUE IOHEXOL 300 MG/ML  SOLN COMPARISON:  11/03/2020 FINDINGS: CT CHEST FINDINGS Cardiovascular: The heart and great vessels are grossly unremarkable. There is trace pericardial fluid anteriorly. Moderate atherosclerosis of the aorta, great vessel origins, and coronary vasculature. Mediastinum/Nodes: No enlarged mediastinal, hilar, or axillary lymph nodes. Thyroid gland, trachea, and esophagus demonstrate no significant findings. Small hiatal hernia. Lungs/Pleura: No acute airspace disease, effusion, or pneumothorax. No pulmonary nodules or masses. The central airways are widely patent. Musculoskeletal: No acute or destructive bony lesions. Reconstructed images demonstrate no additional findings. CT ABDOMEN PELVIS  FINDINGS Hepatobiliary: 1.3 cm cyst is identified  within the superior aspect right lobe liver image 52/3. There is a 1.5 cm indeterminate hypodensity within the right lobe liver adjacent to the gallbladder fossa, reference image 63/3, concerning for metastatic disease given intracranial findings. No other focal liver abnormalities are observed. Gallbladder is unremarkable. 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 Lobular cystic structures seen in the region of the uncinate process of the pancreas, measuring 2.9 x 2.5 cm. This cystic mass demonstrates no internal nodularity or septations, most consistent with sequela of chronic pancreatitis and small pseudocyst. Spleen: Normal in size without focal abnormality. Adrenals/Urinary Tract: The kidneys enhance normally and symmetrically. The bladder is severely distended, which could be due to chronic bladder outlet obstruction. Mild thickening of the body of the left adrenal gland measuring 7 mm without focal abnormality. Right adrenal is normal. Stomach/Bowel: No bowel obstruction or ileus. The appendix, if still present, is not well visualized. No bowel wall thickening or inflammatory change. Vascular/Lymphatic: There is extensive atherosclerosis of the aorta and its branches. Significant calcified plaque within the bilateral common femoral arteries causes greater than 90% stenosis. No pathologic adenopathy within the abdomen or pelvis. Reproductive: Prostate is mildly enlarged measuring 5.8 x 4.0 by 4.8 cm. Asymmetric hypoattenuation of the peripheral zone of the right aspect of the prostate. Please correlate with physical exam findings and serum PSA. Other: No free fluid or free gas.  No abdominal wall hernia. Musculoskeletal: No acute bony abnormalities. There is prominent spondylosis at the L3-4 level. Benign-appearing lucency within the right iliac crest with well-circumscribed margins. Reconstructed images demonstrate  no additional findings. IMPRESSION: 1. Indeterminate hypodensity within the right lobe liver abutting the gallbladder fossa, concerning for neoplasm given intracranial findings. If tissue diagnosis is desired, image guided biopsy could be considered. Given the decreased conspicuity on delayed imaging, follow-up liver ultrasound with possible ultrasound-guided biopsy could be considered. Alternatively, dedicated liver MRI could be considered. 2. Enlarged prostate, with asymmetric decreased attenuation of the right peripheral zone of the prostate. Please correlate with serum PSA and physical exam findings. 3. Distended urinary bladder, which may reflect chronic bladder outlet obstruction given the enlarged prostate. 4. 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 5. No acute intrathoracic process. 6. Aortic Atherosclerosis (ICD10-I70.0). Severe atherosclerosis of the bilateral common femoral arteries with greater than 90% stenosis. Electronically Signed   By: Randa Ngo M.D.   On: 01/22/2021 17:26   MR Brain W and Wo Contrast  Result Date: 01/22/2021 CLINICAL DATA:  Right-sided weakness, brain mass on CT EXAM: MRI HEAD WITHOUT AND WITH CONTRAST TECHNIQUE: Multiplanar, multiecho pulse sequences of the brain and surrounding structures were obtained without and with intravenous contrast. CONTRAST:  42m GADAVIST GADOBUTROL 1 MMOL/ML IV SOLN COMPARISON:  Correlation made with same day CT head FINDINGS: Brain: There is a heterogeneously enhancing frontoparietal mass within the left centrum semiovale with extensive surrounding edema. Mass measures 1.6 x 1.6 x 1.9 cm. No associated reduced diffusion. Susceptibility is present reflecting intralesional blood products or mineralization. No additional mass or abnormal enhancement. Edema causes mild regional mass effect. Vascular: Major vessel  flow voids at the skull base are preserved. Skull and upper cervical spine: Normal marrow signal is preserved. Sinuses/Orbits: Minor mucosal thickening.  Orbits are unremarkable. Other: Sella is partially empty. Minimal patchy mastoid fluid opacification. IMPRESSION: 1.9  cm mass within the left frontoparietal white matter with extensive surrounding edema. Favor metastatic disease over primary neoplasm. Electronically Signed   By: Macy Mis M.D.   On: 01/22/2021 16:39   CT ABDOMEN PELVIS W CONTRAST  Result Date: 01/22/2021 CLINICAL DATA:  Intracranial metastases without known primary malignancy EXAM: CT CHEST, ABDOMEN, AND PELVIS WITH CONTRAST TECHNIQUE: Multidetector CT imaging of the chest, abdomen and pelvis was performed following the standard protocol during bolus administration of intravenous contrast. CONTRAST:  186m OMNIPAQUE IOHEXOL 300 MG/ML  SOLN COMPARISON:  11/03/2020 FINDINGS: CT CHEST FINDINGS Cardiovascular: The heart and great vessels are grossly unremarkable. There is trace pericardial fluid anteriorly. Moderate atherosclerosis of the aorta, great vessel origins, and coronary vasculature. Mediastinum/Nodes: No enlarged mediastinal, hilar, or axillary lymph nodes. Thyroid gland, trachea, and esophagus demonstrate no significant findings. Small hiatal hernia. Lungs/Pleura: No acute airspace disease, effusion, or pneumothorax. No pulmonary nodules or masses. The central airways are widely patent. Musculoskeletal: No acute or destructive bony lesions. Reconstructed images demonstrate no additional findings. CT ABDOMEN PELVIS FINDINGS Hepatobiliary: 1.3 cm cyst is identified within the superior aspect right lobe liver image 52/3. There is a 1.5 cm indeterminate hypodensity within the right lobe liver adjacent to the gallbladder fossa, reference image 63/3, concerning for metastatic disease given intracranial findings. No other focal liver abnormalities are observed. Gallbladder is unremarkable.  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 Lobular cystic structures seen in the region of the uncinate process of the pancreas, measuring 2.9 x 2.5 cm. This cystic mass demonstrates no internal nodularity or septations, most consistent with sequela of chronic pancreatitis and small pseudocyst. Spleen: Normal in size without focal abnormality. Adrenals/Urinary Tract: The kidneys enhance normally and symmetrically. The bladder is severely distended, which could be due to chronic bladder outlet obstruction. Mild thickening of the body of the left adrenal gland measuring 7 mm without focal abnormality. Right adrenal is normal. Stomach/Bowel: No bowel obstruction or ileus. The appendix, if still present, is not well visualized. No bowel wall thickening or inflammatory change. Vascular/Lymphatic: There is extensive atherosclerosis of the aorta and its branches. Significant calcified plaque within the bilateral common femoral arteries causes greater than 90% stenosis. No pathologic adenopathy within the abdomen or pelvis. Reproductive: Prostate is mildly enlarged measuring 5.8 x 4.0 by 4.8 cm. Asymmetric hypoattenuation of the peripheral zone of the right aspect of the prostate. Please correlate with physical exam findings and serum PSA. Other: No free fluid or free gas.  No abdominal wall hernia. Musculoskeletal: No acute bony abnormalities. There is prominent spondylosis at the L3-4 level. Benign-appearing lucency within the right iliac crest with well-circumscribed margins. Reconstructed images demonstrate no additional findings. IMPRESSION: 1. Indeterminate hypodensity within the right lobe liver abutting the gallbladder fossa, concerning for neoplasm given intracranial findings. If tissue diagnosis is desired, image guided biopsy could be considered. Given the decreased conspicuity on delayed imaging, follow-up liver  ultrasound with possible ultrasound-guided biopsy could be considered. Alternatively, dedicated liver MRI could be considered. 2. Enlarged prostate, with asymmetric decreased attenuation of the right peripheral zone of the prostate. Please correlate with serum PSA and physical exam findings. 3. Distended urinary bladder, which may reflect chronic bladder outlet obstruction given the enlarged prostate. 4. 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 5. No acute intrathoracic process. 6. Aortic Atherosclerosis (ICD10-I70.0). Severe atherosclerosis of the  bilateral common femoral arteries with greater than 90% stenosis. Electronically Signed   By: Randa Ngo M.D.   On: 01/22/2021 17:26   MR LIVER W WO CONTRAST  Result Date: 01/24/2021 CLINICAL DATA:  History of liver lesion discovered on a is CT of the abdomen and pelvis. EXAM: MRI ABDOMEN WITHOUT AND WITH CONTRAST TECHNIQUE: Multiplanar multisequence MR imaging of the abdomen was performed both before and after the administration of intravenous contrast. CONTRAST:  94m GADAVIST GADOBUTROL 1 MMOL/ML IV SOLN COMPARISON:  CT of the abdomen and pelvis of August 22, CT of the chest, abdomen and pelvis of January 22, 2021. FINDINGS: Lower chest: No sign of effusion or evidence of consolidative process at the lung bases. Limited assessment on MRI. Hepatobiliary: Signs of hepatic steatosis, mild. Lesion of concern in the area of the gallbladder fossa shows restricted diffusion and peripheral enhancement with washout. (Image 47/1) Multiple other foci of rim enhancement and restricted diffusion. (Image 38/71) 7 mm lesion in the lateral segment of the LEFT hepatic lobe. (Image 35/701 9 mm lesion in the RIGHT hepatic lobe, hepatic subsegment V/VIII. Small satellite lesions about the dominant lesion in hepatic subsegment IV/V. At least 8  additional lesions in the RIGHT hepatic lobe. Pancreas: Marked pancreatic atrophy. Cystic lesion in the head of the pancreas measuring 2.8 x 2.4 cm without signs of pancreatic ductal dilation or signs of inflammation. No nodular enhancement of this lesion. Spleen:  Normal spleen. Adrenals/Urinary Tract: Adrenal glands are normal. Symmetric renal enhancement. No hydronephrosis. No suspicious renal lesion. Stomach/Bowel: Gastroesophageal thickening with masslike appearance. Small lymph node adjacent to the GE junction. Gastroesophageal abnormality best seen on coronal image eighty of series 8. No acute gastrointestinal process to the extent evaluated on abdominal MRI. Vascular/Lymphatic: Patent abdominal vasculature though atherosclerotic narrowing of the origin of the SMA and atherosclerotic plaque of the abdominal aorta without aneurysmal dilation. Small lymph nodes adjacent to the distal esophagus suspicious based on other findings outlined above. No retroperitoneal adenopathy. No gastrohepatic lymphadenopathy. Portal vein is patent into the liver. Hepatic arterial supply arises in a classic fashion from the celiac. Other:  No ascites. Musculoskeletal: No suspicious bone lesions identified. IMPRESSION: 1. 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 Endoscopic assessment may be helpful for further evaluation. Small lymph nodes in the area of the gastroesophageal junction also suspicious. 2. Hepatic steatosis. 3. Cystic lesion in the head of the pancreas without high-risk features of ductal dilation or mural nodularity. 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 Consider short interval follow-up at 6 month interval common approach that may be more reasonable in the current context. Based on size a more aggressive approach would be endoscopic ultrasound and sampling. 4. Atherosclerosis with  some narrowing of the origin of the SMA. Aortic Atherosclerosis (ICD10-I70.0). Electronically Signed   By: GZetta BillsM.D.   On: 01/24/2021 08:37   EEG adult  Result Date: 01/23/2021 YLora Havens MD     01/23/2021  4:08 PM Patient Name: Jason AbbeMRN: [id number] Attending: PLora HavensReferring Physician/Provider: SMyra Rude NP Date: 01/23/2021 Duration: 22.22 mins Patient history:  70y.o. male with PMHx of CAD, HLD, DM2, COPD, HTN, OSA on CPAP, obesity, remote smoking history who presented to the ED 01/22/2021 for evaluation of right-sided weakness, numbness, and paresthesias. CTH was obtained revealing a left parietal mass with extensive vasogenic edema.  EEG to evaluate for seizures. Level of alertness: Awake, asleep  AEDs during EEG study: GBP Technical aspects: This EEG study was done with scalp electrodes positioned according to the 10-20 International system of electrode placement. Electrical activity was acquired at a sampling rate of '500Hz'$  and reviewed with a high frequency filter of '70Hz'$  and a low frequency filter of '1Hz'$ . EEG data were recorded continuously and digitally stored. Description: The posterior dominant rhythm consists of 7.5 Hz activity of moderate voltage (25-35 uV) seen predominantly in posterior head regions, symmetric and reactive to eye opening and eye closing. Sleep was characterized by vertex waves, sleep spindles (12 to 14 Hz), maximal frontocentral region. EEG showed continuous generalized and maximal left temporoparietal 5-6 Hz theta slowing.  Intermittent left temporoparietal 2 to 3 Hz sharply contoured and rhythmic delta slowing was also noted.  Hyperventilation and photic stimulation were not performed.   ABNORMALITY - Continuous slow, generalized and maximal left temporoparietal region -Intermittent rhythmic slow, left temporoparietal region IMPRESSION: This study showed rhythmic delta activity in left temporoparietal region which is on the  ictal-interictal continuum with low potential for seizures. There is also cortical dysfunction in left temporoparietal region likely secondary to underlying structural abnormality/ mass. Additionally there is mild diffuse encephalopathy, nonspecific etiology.  No seizures or definite epileptiform discharges were seen throughout the recording. Priyanka O Yadav   VAS [place] LOWER EXTREMITY VENOUS (DVT)  Result Date: 01/08/2021  Lower Venous DVT Study Patient Name:  Jason Snyder  Date of Exam:   01/02/2021 Medical Rec #: [id number]    Accession #:    [id number] Date of Birth: [birth year]    Patient Gender: M Patient Age:   69Y Exam Location:  [place] Procedure:      VAS [place] LOWER EXTREMITY VENOUS (DVT) Referring Phys: [id number] BRIAN J MUNLEY --------------------------------------------------------------------------------  Other Indications: Pain of left lower extremity [M79.605 (ICD-10-CM)]. Risk Factors: Hypertension, CAD, Diabetes. Performing Technologist: Luane School, RDCS, RVT  Examination Guidelines: A complete evaluation includes B-mode imaging, spectral Doppler, color Doppler, and power Doppler as needed of all accessible portions of each vessel. Bilateral testing is considered an integral part of a complete examination. Limited examinations for reoccurring indications may be performed as noted. The reflux portion of the exam is performed with the patient in reverse Trendelenburg.  +---------+---------------+---------+-----------+----------+--------------+ RIGHT    CompressibilityPhasicitySpontaneityPropertiesThrombus Aging +---------+---------------+---------+-----------+----------+--------------+ CFV      Full           Yes      Yes                                 +---------+---------------+---------+-----------+----------+--------------+ SFJ      Full                                                        +---------+---------------+---------+-----------+----------+--------------+ FV Prox  Full                                                         +---------+---------------+---------+-----------+----------+--------------+ FV Mid   Full                                                        +---------+---------------+---------+-----------+----------+--------------+  FV DistalFull                                                        +---------+---------------+---------+-----------+----------+--------------+ PFV      Full                                                        +---------+---------------+---------+-----------+----------+--------------+ POP      Full           Yes      Yes                                 +---------+---------------+---------+-----------+----------+--------------+ PTV      Full                                                        +---------+---------------+---------+-----------+----------+--------------+ PERO     Full                                                        +---------+---------------+---------+-----------+----------+--------------+   +---------+---------------+---------+-----------+----------+--------------+ LEFT     CompressibilityPhasicitySpontaneityPropertiesThrombus Aging +---------+---------------+---------+-----------+----------+--------------+ CFV      Full           Yes      Yes                                 +---------+---------------+---------+-----------+----------+--------------+ SFJ      Full                                                        +---------+---------------+---------+-----------+----------+--------------+ FV Prox  Full                                                        +---------+---------------+---------+-----------+----------+--------------+ FV Mid   Full                                                        +---------+---------------+---------+-----------+----------+--------------+ FV DistalFull                                                         +---------+---------------+---------+-----------+----------+--------------+  PFV      Full                                                        +---------+---------------+---------+-----------+----------+--------------+ POP      Full           Yes      Yes                                 +---------+---------------+---------+-----------+----------+--------------+ PTV      Full                                                        +---------+---------------+---------+-----------+----------+--------------+ PERO     Full                                                        +---------+---------------+---------+-----------+----------+--------------+     Summary: RIGHT: - No evidence of deep vein thrombosis in the lower extremity. No indirect evidence of obstruction proximal to the inguinal ligament. - There is no evidence of deep vein thrombosis in the lower extremity.  LEFT: - No evidence of deep vein thrombosis in the lower extremity. No indirect evidence of obstruction proximal to the inguinal ligament. - There is no evidence of deep vein thrombosis in the lower extremity.  *See table(s) above for measurements and observations. Electronically signed by Shirlee More MD on 01/08/2021 at 11:36:53 AM.    Final     Labs:  CBC: Recent Labs    04/11/20 0824 01/22/21 1219 01/23/21 0346  WBC 7.0 7.5 9.6  HGB 17.1 17.0 16.6  HCT 49.9 50.8 49.0  PLT 257 251 232    COAGS: Recent Labs    01/22/21 1219  INR 1.1  APTT 32    BMP: Recent Labs    04/11/20 0824 01/22/21 1219 01/23/21 0346  NA 136 136 135  K 4.9 4.0 4.0  CL 98 97* 99  CO2 '31 26 27  '$ GLUCOSE 128* 126* 152*  BUN '14 16 18  '$ CALCIUM 9.6 9.6 9.2  CREATININE 1.02 1.09 1.11  GFRNONAA 76 >60 >60  GFRAA 88  --   --     LIVER FUNCTION TESTS: Recent Labs    04/11/20 0824 01/22/21 1219  BILITOT 0.7 1.3*  AST 16 23  ALT 21 26  ALKPHOS  --  58  PROT 7.2 7.3  ALBUMIN  --  4.4    TUMOR  MARKERS: No results for input(s): AFPTM, CEA, CA199, CHROMGRNA in the last 8760 hours.  Assessment and Plan:  Hx CAD, diabetes, hyperlipidemia, COPD, and GERD.  Patient presented to the ED complaining of right arm and right leg weakness and tingling on the right side about 2 weeks prior.  He also reported difficulty walking.  MRI of the liver on 8/24 showed hepatic lesions and lesion to head of pancreas.  MRI brain on 8/22 found patient had probable metastatic disease.  MR Liver IMPRESSION: 1. 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 Endoscopic assessment may be helpful for further evaluation. Small lymph nodes in the area of the gastroesophageal junction also suspicious. 2. Hepatic steatosis. 3. Cystic lesion in the head of the pancreas without high-risk features of ductal dilation or mural nodularity. 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 Consider short interval follow-up at 6 month interval common approach that may be more reasonable in the current context. Based on size a more aggressive approach would be endoscopic ultrasound and sampling. 4. Atherosclerosis with some narrowing of the origin of the SMA.  MR Brain IMPRESSION: 1.9 cm mass within the left frontoparietal white matter with extensive surrounding edema. Favor metastatic disease over primary neoplasm  Patient referred by Dr. Erin Hearing  today for liver lesion biopsy.  Patient is afebrile. Vital signs WNL.  Last documented BP 124/80 White blood count 8/23 WNL  INR 8/22 1.1.  Risks and benefits of liver lesion biopsy was discussed with the patient and/or patient's family including, but not limited to bleeding, infection, damage to adjacent structures or low yield requiring additional tests.  All of the questions were answered and there is agreement to proceed.  Consent signed and  in chart.   Thank you for this interesting consult.  I greatly enjoyed meeting Jason Snyder and look forward to participating in their care.  A copy of this report was sent to the requesting provider on this date.  Electronically Signed: Tyson Alias, NP 01/24/2021, 9:25 AM   I spent a total of 30 minutes in face to face in clinical consultation, greater than 50% of which was counseling/coordinating care for liver lesion biopsy.

## 2021-01-24 NOTE — Progress Notes (Signed)
PT Cancellation Note  Patient Details Name: Jason Snyder MRN: [id number] DOB: 01/12/1951   Cancelled Treatment:    Reason Eval/Treat Not Completed: Active bedrest order. New order received however pt is currently on bedrest due to having liver biopsy done. RN reports another 2 hours of bedrest. Acute PT to return as able to complete PT eval.  Kittie Plater, PT, DPT Acute Rehabilitation Services Pager #: [phone] Office #: [phone]    Berline Lopes 01/24/2021, 11:45 AM

## 2021-01-24 NOTE — Progress Notes (Signed)
Patient had liver biopsy this AM with Dr. Serafina Royals from IR. Heparin was held prior to this procedure. Discussed case with Dr. Pascal Lux, IR,  and recommended resuming hep q 8 hours for DVT ppx until the evening dose on 8/25. Orders updated per recommendation.   Jason Foster, MD  [facility] Service, PGY-3  [place] Intern Pager [phone]

## 2021-01-24 NOTE — Plan of Care (Signed)

## 2021-01-24 NOTE — Procedures (Signed)
Interventional Radiology Procedure Note  Procedure: Ultrasound guided liver mass biopsy  Findings: Please refer to procedural dictation for full description. Anterior segment IV mass biopsy with 18 ga core, x2.  Samples placed in formalin.  Gelfoam slurry needle track embolization.  Complications: None immediate  Estimated Blood Loss: < 5 mL  Recommendations: Strict 3 hours bedrest. Follow up Pathology results.   Ruthann Cancer, MD Pager: [phone]

## 2021-01-25 ENCOUNTER — Other Ambulatory Visit: Payer: Self-pay | Admitting: Radiation Therapy

## 2021-01-25 DIAGNOSIS — D496 Neoplasm of unspecified behavior of brain: Secondary | ICD-10-CM | POA: Diagnosis not present

## 2021-01-25 DIAGNOSIS — K769 Liver disease, unspecified: Secondary | ICD-10-CM

## 2021-01-25 DIAGNOSIS — R200 Anesthesia of skin: Secondary | ICD-10-CM | POA: Diagnosis not present

## 2021-01-25 DIAGNOSIS — M47816 Spondylosis without myelopathy or radiculopathy, lumbar region: Secondary | ICD-10-CM | POA: Diagnosis not present

## 2021-01-25 DIAGNOSIS — C221 Intrahepatic bile duct carcinoma: Secondary | ICD-10-CM

## 2021-01-25 DIAGNOSIS — R531 Weakness: Secondary | ICD-10-CM

## 2021-01-25 DIAGNOSIS — R1319 Other dysphagia: Secondary | ICD-10-CM

## 2021-01-25 DIAGNOSIS — G9389 Other specified disorders of brain: Secondary | ICD-10-CM | POA: Diagnosis not present

## 2021-01-25 LAB — GLUCOSE, CAPILLARY
Glucose-Capillary: 147 mg/dL — ABNORMAL HIGH (ref 70–99)
Glucose-Capillary: 139 mg/dL — ABNORMAL HIGH (ref 70–99)
Glucose-Capillary: 157 mg/dL — ABNORMAL HIGH (ref 70–99)
Glucose-Capillary: 136 mg/dL — ABNORMAL HIGH (ref 70–99)

## 2021-01-25 MED ORDER — CIPROFLOXACIN-DEXAMETHASONE 0.3-0.1 % OT SUSP
4.0000 [drp] | Freq: Two times a day (BID) | OTIC | Status: DC | PRN
  Administered 2021-01-26: 4 [drp] via OTIC

## 2021-01-25 NOTE — Progress Notes (Signed)
FPTS Brief Progress Note  S Saw patient at bedside this evening. Patient was sitting in bed. RN was giving him tylenol for his back pain. No other concerns at this time. No concerns from RN.   O: BP (!) 160/96 (BP Location: Right Arm)   Pulse 79   Temp 98.2 F (36.8 C) (Oral)   Resp 18   Ht '6\' 2"'$  (1.88 m)   Wt 111.1 kg   SpO2 96%   BMI 31.46 kg/m    General: Alert, no acute distress, pleasant   A/P: Plan per day team  Liver biopsy consistent with cholangiocarcinoma -5000U Heparin one dose this evening  -Continue dexamethasone '2mg'$  BID  -NPO MN  -EGD am  - Orders reviewed. Labs for AM ordered, which was adjusted as needed.   Lattie Haw, MD 01/25/2021, 7:59 PM PGY-3, [facility] Night Resident  Please page [phone] with questions.

## 2021-01-25 NOTE — Progress Notes (Signed)
SLP Cancellation Note  Patient Details Name: Jason Snyder MRN: [id number] DOB: 08-18-1950   Cancelled treatment:       Reason Eval/Treat Not Completed: Other (comment) (Pt with gastroenterology currently) will continue efforts for completion of speech language evaluation   [place], [place]   01/25/2021, 2:20 PM

## 2021-01-25 NOTE — TOC Initial Note (Signed)
Transition of Care [facility]) - Initial/Assessment Note    Patient Details  Name: Jason Snyder MRN: [id number] Date of Birth: 07-Sep-1950  [facility] CM/SW Contact:    Pollie Friar, RN Phone Number: 01/25/2021, 11:03 AM  Clinical Narrative:                 Patient is from home with his spouse that can provide almost 24 hour supervision. Pt denies issues with home transportation or medications. Recommendations are for outpatient therapy. Pt lives in [place] and prefers to attend close to home. They have selected the outpatient rehab at [facility]. CM has faxed over the orders: [phone].  Pt with orders for walker. [employer] will deliver the DME to the patients room. Pt has transportation home when medically ready.   Expected Discharge Plan: OP Rehab Barriers to Discharge: Continued Medical Work up   Patient Goals and CMS Choice     Choice offered to / list presented to : Patient  Expected Discharge Plan and Services Expected Discharge Plan: OP Rehab   Discharge Planning Services: CM Consult   Living arrangements for the past 2 months: Single Family Home                 DME Arranged: Walker rolling DME Agency: [employer] Date DME Agency Contacted: 01/25/21   Representative spoke with at DME Agency: Freda Munro            Prior Living Arrangements/Services Living arrangements for the past 2 months: Single Family Home Lives with:: Spouse Patient language and need for interpreter reviewed:: Yes Do you feel safe going back to the place where you live?: Yes      Need for Family Participation in Patient Care: Yes (Comment) Care giver support system in place?: Yes (comment) Current home services: DME (cane) Criminal Activity/Legal Involvement Pertinent to Current Situation/Hospitalization: No - Comment as needed  Activities of Daily Living Home Assistive Devices/Equipment: Walker (specify type) (front wheel) ADL Screening (condition at time of  admission) Patient's cognitive ability adequate to safely complete daily activities?: Yes Is the patient deaf or have difficulty hearing?: No Does the patient have difficulty seeing, even when wearing glasses/contacts?: No Does the patient have difficulty concentrating, remembering, or making decisions?: No Patient able to express need for assistance with ADLs?: No Does the patient have difficulty dressing or bathing?: No Independently performs ADLs?: No Communication: Independent Dressing (OT): Needs assistance Is this a change from baseline?: Change from baseline, expected to last >3 days Grooming: Needs assistance Is this a change from baseline?: Change from baseline, expected to last >3 days Feeding: Independent Bathing: Needs assistance Is this a change from baseline?: Change from baseline, expected to last >3 days Toileting: Needs assistance Is this a change from baseline?: Change from baseline, expected to last >3days In/Out Bed: Needs assistance Is this a change from baseline?: Change from baseline, expected to last >3 days Walks in Home: Needs assistance Is this a change from baseline?: Change from baseline, expected to last >3 days Does the patient have difficulty walking or climbing stairs?: Yes Weakness of Legs: Right Weakness of Arms/Hands: Right  Permission Sought/Granted                  Emotional Assessment Appearance:: Appears stated age Attitude/Demeanor/Rapport: Engaged Affect (typically observed): Accepting Orientation: : Oriented to Self, Oriented to Situation, Oriented to Place, Oriented to  Time   Psych Involvement: No (comment)  Admission diagnosis:  Brain mass [G93.89] Neoplasm of brain  causing mass effect on adjacent structures [facility]) [D49.6] Right sided weakness [R53.1] Right sided numbness [R20.0] Patient Active Problem List   Diagnosis Date Noted   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 structures ([place]) 01/23/2021   Brain mass  01/22/2021   Thoracic and lumbosacral neuritis    Obesity (BMI 30-39.9)    Hypertension    Hyperlipidemia    History of lumbar surgery    Diabetes mellitus without complication ([place])    DDD (degenerative disc disease), lumbar    Chronic low back pain with right-sided sciatica    Chronic left shoulder pain    CAD (coronary artery disease)    BMI 30.0-30.9,adult    Combined hyperlipidemia associated with type 2 diabetes mellitus ([place]) 07/09/2019   Coronary artery disease involving native coronary artery of native heart without angina pectoris    Encounter for screening for lung cancer 11/09/2018   Rosacea 10/03/2017   Smoking greater than 30 pack years - quit 03/2018 10/03/2017   BPH without obstruction/lower urinary tract symptoms 10/03/2017   Gastroesophageal reflux disease 01/10/2015   Primary osteoarthritis involving multiple joints 01/10/2015   Type 2 diabetes mellitus with peripheral neuropathy ([place]) 07/12/2014   Controlled type 2 diabetes mellitus without complication, without long-term current use of insulin ([place]) 07/12/2014   COPD (chronic obstructive pulmonary disease) ([place]) 01/07/2014   Chronic pain 10/07/2013   OSA on CPAP 10/07/2013   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   Essential hypertension 08/31/2010   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   PCP:  [name], MD Pharmacy:   [facility] # [address] [place] [address] [place] [address] Phone: [phone] Fax: [phone]     Social Determinants of Health (SDOH) Interventions    Readmission Risk Interventions No flowsheet data found.

## 2021-01-25 NOTE — Plan of Care (Signed)

## 2021-01-25 NOTE — Progress Notes (Signed)
Physical Therapy Treatment Patient Details Name: Jason Snyder MRN: [id number] DOB: 1950/10/19 Today's Date: 01/25/2021    History of Present Illness Pt is a 70 y.o. male who presented 01/22/21 with R-sided weakness and numbness that has progressively worsened since the prior week. CT head noted left parietal mass most concerning for metastasis.  MRI notes 1.9 cm mass which additionally favors metastatic disease or primary neoplasm. CT abdomen notes hypodensity within the right lobe of the liver which is concerning for neoplasm given intracranial findings. S/p US guided liver mass biopsy 8/23. PMH: CAD, DM2, COPD, HTN    PT Comments    Patient progressing well towards physical therapy goals. Patient ambulated 300' with supervision and RW. Patient negotiated 10 stairs with L hand rail on ascent and alternating pattern. Educated patient on need for supervision on stairs for safety, patient verbalized understanding. Continue to recommend OPPT following discharge to address balance and strength deficits.     Follow Up Recommendations  Outpatient PT;Supervision for mobility/OOB     Equipment Recommendations  Rolling Makira Holleman with 5" wheels    Recommendations for Other Services       Precautions / Restrictions Precautions Precautions: Fall Restrictions Weight Bearing Restrictions: No    Mobility  Bed Mobility Overal bed mobility: Independent                  Transfers Overall transfer level: Modified independent Equipment used: Rolling Carols Clemence (2 wheeled)                Ambulation/Gait Ambulation/Gait assistance: Supervision Gait Distance (Feet): 300 Feet Assistive device: Rolling Gilma Bessette (2 wheeled) Gait Pattern/deviations: Step-through pattern;Decreased stride length Gait velocity: reduced   General Gait Details: ambulated with RW with supervision. Improved R foot clearance this session. No LOB noted   Stairs Stairs: Yes Stairs assistance: Supervision Stair  Management: One rail Left;One rail Right;Alternating pattern;Step to pattern;Forwards Number of Stairs: 10 General stair comments: ascends with L hand rail and descends with R rail. On ascent, patient with alternating pattern. On descent, step to pattern leading with R on descent   Wheelchair Mobility    Modified Rankin (Stroke Patients Only) Modified Rankin (Stroke Patients Only) Pre-Morbid Rankin Score: No symptoms Modified Rankin: Moderately severe disability     Balance Overall balance assessment: Mild deficits observed, not formally tested                                          Cognition Arousal/Alertness: Awake/alert Behavior During Therapy: WFL for tasks assessed/performed Overall Cognitive Status: Impaired/Different from baseline Area of Impairment: Problem solving                             Problem Solving: Difficulty sequencing;Requires verbal cues        Exercises      General Comments        Pertinent Vitals/Pain Pain Assessment: No/denies pain    Home Living                      Prior Function            PT Goals (current goals can now be found in the care plan section) Acute Rehab PT Goals Patient Stated Goal: to figure out next steps PT Goal Formulation: With patient/family Time For Goal Achievement: 02/07/21 Potential to Achieve Goals:  Good Progress towards PT goals: Progressing toward goals    Frequency    Min 4X/week      PT Plan Current plan remains appropriate    Co-evaluation              AM-PAC PT "6 Clicks" Mobility   Outcome Measure  Help needed turning from your back to your side while in a flat bed without using bedrails?: None Help needed moving from lying on your back to sitting on the side of a flat bed without using bedrails?: None Help needed moving to and from a bed to a chair (including a wheelchair)?: None Help needed standing up from a chair using your arms (e.g.,  wheelchair or bedside chair)?: None Help needed to walk in hospital room?: A Little Help needed climbing 3-5 steps with a railing? : A Little 6 Click Score: 22    End of Session Equipment Utilized During Treatment: Gait belt Activity Tolerance: Patient tolerated treatment well Patient left: in bed;with call bell/phone within reach (MD present) Nurse Communication: Mobility status PT Visit Diagnosis: Unsteadiness on feet (R26.81);Other abnormalities of gait and mobility (R26.89);Muscle weakness (generalized) (M62.81);History of falling (Z91.81);Difficulty in walking, not elsewhere classified (R26.2);Other symptoms and signs involving the nervous system [id number])     Time: [id number] PT Time Calculation (min) (ACUTE ONLY): 19 min  Charges:  $Gait Training: 8-22 mins                     Eily Louvier A. Gilford Rile PT, DPT Acute Rehabilitation Services Pager [phone] Office [phone]    Linna Hoff 01/25/2021, 5:36 PM

## 2021-01-25 NOTE — Progress Notes (Addendum)
Family Medicine Teaching Service Daily Progress Note Intern Pager: [phone]  Patient name: Jason Snyder Medical record number: [id number] Date of birth: [birth year] Age: 70 y.o. 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  Past medical history significant for HTN, CAD, COPD, HLD, T2DM, BPH s/p prostate cryoablation.  Right-sided weakness and numbness 2/2 left frontoparietal mass  primary cholangiocarcinoma Presented to ED with right-sided upper and lower extremity weakness and numbness progressively worsened through the week.  CT head noted left parietal mass concerning for metastasis, supported by MRI.  CT abdomen noted hyperdensity within the right lobe of liver concerning for primary neoplasm.  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  Small lymph nodes in the area of the GE junction.  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  Liver biopsy showed primary cholangiocarcinoma.  Patient has improved numbness by dexamethasone per neurosurgery recommendations. 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 -Stable on medications.  -Continue to monitor  blood pressure and CBG  FEN/GI: N.p.o. for EGD PPx: Lovenox Dispo:Home today. Barriers include EGD.   Subjective:  Patient states he is feeling well this morning and does not have any new complaints or concerns.  Believes his numbness is improving in his right-sided extremities with continues dexamethasone treatment.  Objective: Temp:  [97.6 F (36.4 C)-98.2 F (36.8 C)] 97.9 F (36.6 C) (08/25 1139) Pulse Rate:  [64-80] 66 (08/25 1139) Resp:  [18-19] 18 (08/25 1139) BP: (107-154)/(57-97) 130/81 (08/25 1139) SpO2:  [93 %-97 %] 94 % (08/25 1139) Physical Exam: General: Awake, alert, no acute distress Cardiovascular: RRR, no murmurs auscultated Respiratory: CTA B, no increased work of breathing, no wheezing or crackles Abdomen: Soft, nontender, normoactive bowel sounds Extremities: 4/5 strength in right upper and lower extremity, lingering numbness in right hand  Laboratory: Recent Labs  Lab 01/22/21 1219 01/23/21 0346  WBC 7.5 9.6  HGB 17.0 16.6  HCT 50.8 49.0  PLT 251 232   Recent Labs  Lab 01/22/21 1219 01/23/21 0346  NA 136 135  K 4.0 4.0  CL 97* 99  CO2 26 27  BUN 16 18  CREATININE 1.09 1.11  CALCIUM 9.6 9.2  PROT 7.3  --   BILITOT 1.3*  --   ALKPHOS 58  --   ALT 26  --   AST 23  --   GLUCOSE 126* 152*   CBG (last 3)  Recent Labs    01/25/21 2153 01/26/21 0619 01/26/21 1133  GLUCAP 136* 149* 110*     Imaging/Diagnostic Tests: No imaging/diagnostic tests at this time.  To receive EGD this a.m.  Wells Guiles, DO 01/25/2021, 2:53 PM PGY-1, [place] Intern pager: [phone], text pages welcome

## 2021-01-25 NOTE — Progress Notes (Signed)
OT Cancellation Note  Patient Details Name: Jason Snyder MRN: [id number] DOB: 08-30-1950   Cancelled Treatment:    Reason Eval/Treat Not Completed: Other (comment) patient with gastroenterology at this time. OT to check back as time allows.   Gloris Manchester OTR/L Supplemental OT, Department of rehab services [phone]  Solveig Fangman R H. 01/25/2021, 2:23 PM

## 2021-01-25 NOTE — Hospital Course (Addendum)
Jason Snyder is a 70 y.o. male who presented initially with right sided weakness. PMH is significant for HTN, CAD, COPD, HLD, T2DM, BPH s/p prostrate cryoablation. His hospital course is outlined below.   Right Sided Weakness 2/2 left parietal mass  Pt presented initially to the ED with right-sided upper and lower extremity weakness and numbness that had progressively worsened over a week.  CT head noted left parietal mass most concerning for metastasis.  MRI noted 1.9 cm mass which additionally favors metastatic disease or primary neoplasm. CT abdomen noted hypodensity within the right lobe of the liver which is concerning for neoplasm given intracranial findings. Per neurosurgery recs, we gave 10q6 dexamethasone x4 doses and then decreased to '2mg'$  BID. EEG performed by neurology showed no evidence of seizures. 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  Small lymph nodes in the area in the GE junction.  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 IR continued with a liver biopsy and results showed adenocarcinoma and was suspicious for cholangiocarcinoma.  LFTs have been largely within normal limits and no biliary obstruction is seen on imaging.  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 Biopsy is pending but patient was deemed to be stable for discharge and follow-up with oncology.  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  Issues for Follow Up:  Cystic abnormality at head of the pancreas follow-up imaging in 6 months if clinically appropriate at that time Plan on SRS with neurosurgery, brain tumor coordinator will reach out after discharge Consider PPI prophylaxis for long term dexamethasone  treatment Discuss testosterone treatment with prescribing physician in the setting of newly diagnosis malignancy Reevaluate BP management given normal blood pressures on metoprolol alone. Held combination BP medication Follow-up patient's blood glucose levels on outpatient basis

## 2021-01-25 NOTE — H&P (View-Only) (Signed)
Referring Provider:  Dr. Dorris Singh Primary Care Physician:  [name], MD Primary Gastroenterologist:  Althia Forts  Reason for Consultation:  Metastatic cancer of unknown primary  HPI: Jason Snyder is a 70 y.o. male with PMH of HTN, CAD, COPD, HLD, DM2, OSA on CPAP, DDD, and GERD.  He presented with right-sided weakness.  Had imaging of his head/brain that showed a lesion suspicious for metastatic lesion.  This prompted several other imaging studies of his abdomen, etc.  Most recent imaging is MRI of the abdomen that showed the following:  IMPRESSION: 1. 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 Endoscopic assessment may be helpful for further evaluation. Small lymph nodes in the area of the gastroesophageal junction also suspicious. 2. Hepatic steatosis. 3. Cystic lesion in the head of the pancreas without high-risk features of ductal dilation or mural nodularity. 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 Consider short interval follow-up at 6 month interval common approach that may be more reasonable in the current context. Based on size a more aggressive approach would be endoscopic ultrasound and sampling. 4. Atherosclerosis with some narrowing of the origin of the SMA.   Aortic Atherosclerosis (ICD10-I70.0).  Lab work-up is unremarkable.  Patient is not anemic.  LFTs are normal.  He tells me that from a GI standpoint he has been having some upper abdominal discomfort just the last couple of days.  He has chronic longstanding GERD for which he takes omeprazole daily and that seems to help for the most part.  He has some intermittent dysphagia and points high up in his throat.  He says that that is been going on for a long time.  He has had no unintentional weight loss.  He had an EGD over 30 years ago when he was taking NSAIDs and was found to  have a bleeding ulcer.  That was performed in [place].  He also had a colonoscopy at that time and has not had one since.  He does do Cologuards regularly, however, and those have been negative according to his report.  He says that he moves his bowels regularly without any evidence of black or bloody stools.  He had a biopsy of the liver lesion yesterday.   Past Medical History:  Diagnosis Date   BMI 30.0-30.9,adult    BPH without obstruction/lower urinary tract symptoms 10/03/2017   CAD (coronary artery disease)    cath 2020, single vessel disease, medical mgt   Chronic left shoulder pain c   Chronic low back pain with right-sided sciatica    Chronic pain 10/07/2013   Combined hyperlipidemia associated with type 2 diabetes mellitus ([place]) 07/09/2019   Controlled type 2 diabetes mellitus without complication, without long-term current use of insulin ([place]) 07/12/2014   COPD (chronic obstructive pulmonary disease) ([place]) 01/07/2014   PFTs 07/2018 minimal airway obstruction, overinflation and no response to bronchodilators.    Coronary artery disease involving native coronary artery of native heart without angina pectoris    Cardiac Cath 01/2019: Severe single-vessel disease with additional moderate two-vessel disease: 100% CTO of RCA, moderate disease in proximal LCx, distal LCx and very small caliber 1st Diag DFR-FFR negative lesion in proximal LCx Normal LV pressures with previously document, ed normal EF.   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   Hypogonadism -  pineal glad abnormalities On testosterone replacement   Obesity (BMI 30-39.9)    Obesity (BMI 30.0-34.9) 10/03/2017   OSA on CPAP 10/07/2013   Primary osteoarthritis involving multiple joints 01/10/2015   Rosacea    Smoking greater than 30 pack years - quit 03/2018 10/03/2017   Thoracic and lumbosacral neuritis    Type 2 diabetes mellitus with peripheral neuropathy ([place]) 07/12/2014    Past Surgical History:  Procedure Laterality Date   ARTHROSCOPIC REPAIR ACL     CARDIAC CATHETERIZATION     INTRAVASCULAR PRESSURE WIRE/FFR STUDY N/A 01/01/2019   Procedure: INTRAVASCULAR PRESSURE WIRE/FFR STUDY;  Surgeon: Leonie Man, MD;  Location: [facility];  Service: Cardiovascular;  Laterality: N/A;   LEFT HEART CATH AND CORONARY ANGIOGRAPHY N/A 01/01/2019   Procedure: LEFT HEART CATH AND CORONARY ANGIOGRAPHY;  Surgeon: Leonie Man, MD;  Location: [facility];  Service: Cardiovascular;  Laterality: N/A;   PROSTATE CRYOABLATION     03/2019   SHOULDER ARTHROSCOPY     SPINE SURGERY      Prior to Admission medications   Medication Sig Start Date End Date Taking? Authorizing Provider  acetaminophen-codeine (TYLENOL #3) 300-30 MG tablet Take 1 tablet by mouth 2 (two) times daily as needed for moderate pain. 01/11/21  Yes [name], MD  acetic acid 2 % otic solution Place 4 drops into both ears 2 (two) times daily as needed. Patient taking differently: Place 4 drops into both ears 2 (two) times daily as needed (pain). 10/06/19  Yes [name], MD  albuterol (VENTOLIN HFA) 108 (90 Base) MCG/ACT inhaler Inhale 2 puffs by mouth into the lungs every 4 hours as needed for wheezing 12/05/20  Yes [name], MD  amLODipine (NORVASC) 10 MG tablet TAKE ONE TABLET BY MOUTH ONE TIME DAILY Patient taking differently: Take 10 mg by mouth daily. 11/17/20  Yes [name], MD  aspirin 81 MG EC tablet Take 81 mg by mouth daily.    Yes [provider]  atorvastatin (LIPITOR) 20 MG  tablet Take 1 tablet (20 mg total) by mouth at bedtime. 07/12/20  Yes [name], MD  ciprofloxacin-dexamethasone (CIPRODEX) OTIC suspension Place 4 drops into both ears 2 (two) times daily.   Yes [provider]  cyclobenzaprine (FLEXERIL) 10 MG tablet Take 1 tablet (10 mg total) by mouth 3 (three) times daily as needed for muscle spasms. 07/12/20  Yes [name], MD  diclofenac sodium (VOLTAREN) 1 % GEL Apply 2 g topically 4 (four) times daily as needed (pain). 04/21/17  Yes [provider]  FARXIGA 10 MG TABS tablet TAKE ONE TABLET BY MOUTH ONE TIME DAILY Patient taking differently: Take 10 mg by mouth daily. 08/28/20  Yes [name], MD  fluticasone [facility]) 50 MCG/ACT nasal spray Place 1 spray into the nose daily as needed for allergies.  04/09/13  Yes [provider]  gabapentin (NEURONTIN) 300 MG capsule Take 1 capsule (300 mg total) by mouth 3 (three) times daily. 01/11/21  Yes [name], MD  ibuprofen (ADVIL) 200 MG tablet Take 200 mg by mouth every 6 (six) hours as needed for  mild pain.   Yes [provider]  Loratadine 10 MG CAPS Take 10 mg by mouth daily as needed (allergies).    Yes [provider]  losartan-hydrochlorothiazide (HYZAAR) 100-25 MG tablet TAKE ONE TABLET BY MOUTH ONE TIME DAILY 10/26/20  Yes [name], MD  metFORMIN (GLUCOPHAGE) 500 MG tablet Take 1 tablet (500 mg total) by mouth 2 (two) times daily with a meal. 01/11/21  Yes [name], MD  metoprolol tartrate (LOPRESSOR) 50 MG tablet Take 1 tablet (50 mg total) by mouth 2 (two) times daily. 04/11/20  Yes [name], MD  Multiple Vitamin (MULTIVITAMIN) capsule Take 1 capsule by mouth daily.    Yes [provider]  nitroGLYCERIN (NITROSTAT) 0.4 MG SL tablet Place 1 tablet (0.4 mg total) under the tongue every 5 (five) minutes as needed for chest pain. 08/28/20  Yes Richardo Priest, MD  Spacer/Aero-Holding Chambers (AEROCHAMBER MV) inhaler Use  as instructed 11/20/20  Yes Magdalen Spatz, NP  tamsulosin (FLOMAX) 0.4 MG CAPS capsule TAKE TWO CAPSULES BY MOUTH DAILY Patient taking differently: Take 0.4 mg by mouth daily. 03/02/20  Yes [name], MD  testosterone cypionate (DEPOTESTOSTERONE CYPIONATE) 200 MG/ML injection INJECT 1ML INTO THE MUSCLE EVERY 14 DAYS Patient taking differently: Inject 200 mg into the muscle once a week. Friday 03/24/19  Yes [name], MD  Tiotropium Bromide-Olodaterol (STIOLTO RESPIMAT) 2.5-2.5 MCG/ACT AERS Inhale 2 puffs into the lungs daily. 10/10/20  Yes Mannam, Praveen, MD  Ketotifen Fumarate (ALLERGY EYE DROPS OP) Place 1 drop into both eyes daily as needed (allergy).    [provider]    Current Facility-Administered Medications  Medication Dose Route Frequency Provider Last Rate Last Admin   acetaminophen (TYLENOL) tablet 650 mg  650 mg Oral Q6H PRN Simmons-Robinson, Makiera, MD   650 mg at 01/25/21 [id number]   Or   acetaminophen (TYLENOL) suppository 650 mg  650 mg Rectal Q6H PRN Simmons-Robinson, Makiera, MD       acetic acid 2 % OTIC (EAR) solution 4 drop  4 drop Both EARS BID PRN Espinoza, Alejandra, DO       albuterol (PROVENTIL) (2.5 MG/3ML) 0.083% nebulizer solution 2.5 mg  2.5 mg Nebulization Q6H PRN Hammons, Kimberly B, RPH       arformoterol (BROVANA) nebulizer solution 15 mcg  15 mcg Nebulization BID Simmons-Robinson, Makiera, MD   15 mcg at 01/25/21 0815   And   umeclidinium bromide (INCRUSE ELLIPTA) 62.5 MCG/INH 1 puff  1 puff Inhalation Daily Simmons-Robinson, Makiera, MD   1 puff at 01/25/21 0818   atorvastatin (LIPITOR) tablet 20 mg  20 mg Oral QHS Simmons-Robinson, Makiera, MD   20 mg at 01/24/21 2223   ciprofloxacin-dexamethasone (CIPRODEX) 0.3-0.1 % OTIC (EAR) suspension 4 drop  4 drop Both EARS BID PRN Sharion Settler, DO       dapagliflozin propanediol (FARXIGA) tablet 10 mg  10 mg Oral Daily Heloise Purpura, RPH   10 mg at 01/25/21 1028   dexamethasone  (DECADRON) tablet 2 mg  2 mg Oral Q12H Espinoza, Alejandra, DO   2 mg at 01/25/21 1028   fluticasone (FLONASE) 50 MCG/ACT nasal spray 1 spray  1 spray Each Nare Daily PRN Simmons-Robinson, Makiera, MD       gabapentin (NEURONTIN) capsule 300 mg  300 mg Oral TID Simmons-Robinson, Makiera, MD   300 mg at 01/25/21 1028   heparin injection 5,000 Units  5,000 Units Subcutaneous Q8H Simmons-Robinson, Riki Sheer, MD  metFORMIN (GLUCOPHAGE) tablet 500 mg  500 mg Oral BID WC Simmons-Robinson, Makiera, MD   500 mg at 01/25/21 1028   metoprolol tartrate (LOPRESSOR) tablet 50 mg  50 mg Oral BID Simmons-Robinson, Makiera, MD   50 mg at 01/25/21 1027   nitroGLYCERIN (NITROSTAT) SL tablet 0.4 mg  0.4 mg Sublingual Q5 min PRN Simmons-Robinson, Makiera, MD       polyethylene glycol (MIRALAX / GLYCOLAX) packet 17 g  17 g Oral Daily PRN Simmons-Robinson, Makiera, MD       tamsulosin (FLOMAX) capsule 0.8 mg  0.8 mg Oral Daily Simmons-Robinson, Makiera, MD   0.8 mg at 01/25/21 1027    Allergies as of 01/22/2021 - Review Complete 01/22/2021  Allergen Reaction Noted   Rubus fruticosus Rash 04/21/2017    Family History  Problem Relation Age of Onset   Pulmonary embolism Mother    Heart disease Father    Lung cancer Father    Lung cancer Sister    Heart disease Maternal Uncle     Social History   Socioeconomic History   Marital status: Married    Spouse name: Not on file   Number of children: Not on file   Years of education: Not on file   Highest education level: Not on file  Occupational History   Occupation: retired  Tobacco Use   Smoking status: Former    Packs/day: 0.50    Years: 30.00    Pack years: 15.00    Types: Cigarettes    Quit date: 03/05/2018    Years since quitting: 2.8   Smokeless tobacco: Never   Tobacco comments:    no cigarettes since 03/05/18  Vaping Use   Vaping Use: Never used  Substance and Sexual Activity   Alcohol use: Not Currently   Drug use: Not Currently    Sexual activity: Not Currently  Other Topics Concern   Not on file  Social History Narrative   Not on file   Social Determinants of Health   Financial Resource Strain: Low Risk    Difficulty of Paying Living Expenses: Not hard at all  Food Insecurity: No Food Insecurity   Worried About Charity fundraiser in the Last Year: Never true   Arboriculturist in the Last Year: Never true  Transportation Needs: No Transportation Needs   Lack of Transportation (Medical): No   Lack of Transportation (Non-Medical): No  Physical Activity: Insufficiently Active   Days of Exercise per Week: 7 days   Minutes of Exercise per Session: 20 min  Stress: No Stress Concern Present   Feeling of Stress : Not at all  Social Connections: Moderately Isolated   Frequency of Communication with Friends and Family: More than three times a week   Frequency of Social Gatherings with Friends and Family: Once a week   Attends Religious Services: Never   Marine scientist or Organizations: No   Attends Music therapist: Never   Marital Status: Married  Human resources officer Violence: Not At Risk   Fear of Current or Ex-Partner: No   Emotionally Abused: No   Physically Abused: No   Sexually Abused: No    Review of Systems: ROS is O/W negative except as mentioned in HPI.  Physical Exam: Vital signs in last 24 hours: Temp:  [97.6 F (36.4 C)-98.2 F (36.8 C)] 97.9 F (36.6 C) (08/25 1139) Pulse Rate:  [64-80] 66 (08/25 1139) Resp:  [18-19] 18 (08/25 1139) BP: (107-154)/(57-97) 130/81 (08/25 1139) SpO2:  [  93 %-97 %] 94 % (08/25 1139) Last BM Date: 01/24/21 General:  Alert, Well-developed, well-nourished, pleasant and cooperative in NAD Head:  Normocephalic and atraumatic. Eyes:  Sclera clear, no icterus.  Conjunctiva pink. Ears:  Normal auditory acuity. Mouth:  No deformity or lesions.   Lungs:  Clear throughout to auscultation.  No wheezes, crackles, or rhonchi.  Heart:  Regular rate and  rhythm; no murmurs, clicks, rubs, or gallops. Abdomen:  Soft, non-distended.  BS present.  Minimal epigastric TTP.  Msk:  Symmetrical without gross deformities. Pulses:  Normal pulses noted. Extremities:  Without clubbing or edema. Neurologic:  Alert and oriented x 4;  grossly normal neurologically. Skin:  Intact without significant lesions or rashes. Psych:  Alert and cooperative. Normal mood and affect.  Intake/Output from previous day: 08/24 0701 - 08/25 0700 In: -  Out: 300 [Urine:300]  Lab Results: Recent Labs    01/23/21 0346  WBC 9.6  HGB 16.6  HCT 49.0  PLT 232   BMET Recent Labs    01/23/21 0346  NA 135  K 4.0  CL 99  CO2 27  GLUCOSE 152*  BUN 18  CREATININE 1.11  CALCIUM 9.2    Studies/Results: MR LIVER W WO CONTRAST  Result Date: 01/24/2021 CLINICAL DATA:  History of liver lesion discovered on a is CT of the abdomen and pelvis. EXAM: MRI ABDOMEN WITHOUT AND WITH CONTRAST TECHNIQUE: Multiplanar multisequence MR imaging of the abdomen was performed both before and after the administration of intravenous contrast. CONTRAST:  69m GADAVIST GADOBUTROL 1 MMOL/ML IV SOLN COMPARISON:  CT of the abdomen and pelvis of August 22, CT of the chest, abdomen and pelvis of January 22, 2021. FINDINGS: Lower chest: No sign of effusion or evidence of consolidative process at the lung bases. Limited assessment on MRI. Hepatobiliary: Signs of hepatic steatosis, mild. Lesion of concern in the area of the gallbladder fossa shows restricted diffusion and peripheral enhancement with washout. (Image 47/1) Multiple other foci of rim enhancement and restricted diffusion. (Image 38/71) 7 mm lesion in the lateral segment of the LEFT hepatic lobe. (Image 35/701 9 mm lesion in the RIGHT hepatic lobe, hepatic subsegment V/VIII. Small satellite lesions about the dominant lesion in hepatic subsegment IV/V. At least 8 additional lesions in the RIGHT hepatic lobe. Pancreas: Marked pancreatic atrophy.  Cystic lesion in the head of the pancreas measuring 2.8 x 2.4 cm without signs of pancreatic ductal dilation or signs of inflammation. No nodular enhancement of this lesion. Spleen:  Normal spleen. Adrenals/Urinary Tract: Adrenal glands are normal. Symmetric renal enhancement. No hydronephrosis. No suspicious renal lesion. Stomach/Bowel: Gastroesophageal thickening with masslike appearance. Small lymph node adjacent to the GE junction. Gastroesophageal abnormality best seen on coronal image eighty of series 8. No acute gastrointestinal process to the extent evaluated on abdominal MRI. Vascular/Lymphatic: Patent abdominal vasculature though atherosclerotic narrowing of the origin of the SMA and atherosclerotic plaque of the abdominal aorta without aneurysmal dilation. Small lymph nodes adjacent to the distal esophagus suspicious based on other findings outlined above. No retroperitoneal adenopathy. No gastrohepatic lymphadenopathy. Portal vein is patent into the liver. Hepatic arterial supply arises in a classic fashion from the celiac. Other:  No ascites. Musculoskeletal: No suspicious bone lesions identified. IMPRESSION: 1. 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 Endoscopic assessment may be helpful for further evaluation. Small lymph nodes in the area of the gastroesophageal junction also suspicious. 2. Hepatic steatosis. 3. Cystic lesion  in the head of the pancreas without high-risk features of ductal dilation or mural nodularity. 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 Consider short interval follow-up at 6 month interval common approach that may be more reasonable in the current context. Based on size a more aggressive approach would be endoscopic ultrasound and sampling. 4. Atherosclerosis with some narrowing of the origin of the SMA. Aortic Atherosclerosis (ICD10-I70.0).  Electronically Signed   By: Zetta Bills M.D.   On: 01/24/2021 08:37   US BIOPSY (LIVER)  Result Date: 01/24/2021 INDICATION: 70 year old male with indeterminate liver masses. EXAM: ULTRASOUND BIOPSY CORE LIVER MEDICATIONS: None. ANESTHESIA/SEDATION: Moderate (conscious) sedation was employed during this procedure. A total of Versed 1 mg and Fentanyl 50 mcg was administered intravenously. Moderate Sedation Time: 13 minutes. The patient's level of consciousness and vital signs were monitored continuously by radiology nursing throughout the procedure under my direct supervision. COMPLICATIONS: None immediate. PROCEDURE: Informed written consent was obtained from the patient after a thorough discussion of the procedural risks, benefits and alternatives. All questions were addressed. Maximal Sterile Barrier Technique was utilized including caps, mask, sterile gowns, sterile gloves, sterile drape, hand hygiene and skin antiseptic. A timeout was performed prior to the initiation of the procedure. Preprocedure ultrasound demonstrated safe window in the right upper quadrant for focal liver biopsy of anterior segment 4 hypodense mass measuring approximately 19 mm in maximum dimension. The right upper quadrant was prepped and draped in standard fashion. Local anesthesia was administered subdermally at the planned entry site as well as under ultrasound guidance along the hepatic capsule. A skin nick was made. A 17 gauge introducer needle was advanced to the hepatic parenchyma under ultrasound guidance. Next, a total of 2, 18 gauge core biopsies were obtained. The samples were placed in formalin and sent to Pathology. Under ultrasound guidance, a Gel-Foam slurry was administered along the needle entry tract as the introducer needle was withdrawn. Postprocedure ultrasound demonstrated no evidence of perihepatic fluid collection. The patient tolerated the procedure well. IMPRESSION: Technically successful ultrasound-guided  core liver mass biopsy from hepatic segment 4. Ruthann Cancer, MD [facility] Electronically Signed   By: Ruthann Cancer M.D.   On: 01/24/2021 11:11   EEG adult  Result Date: 01/23/2021 Lora Havens, MD     01/23/2021  4:08 PM Patient Name: Jason Snyder MRN: [id number] Epilepsy Attending: Lora Havens Referring Physician/Provider: Myra Rude, NP Date: 01/23/2021 Duration: 22.22 mins Patient history:  70 y.o. male with PMHx of CAD, HLD, DM2, COPD, HTN, OSA on CPAP, obesity, remote smoking history who presented to the ED 01/22/2021 for evaluation of right-sided weakness, numbness, and paresthesias. CTH was obtained revealing a left parietal mass with extensive vasogenic edema.  EEG to evaluate for seizures. Level of alertness: Awake, asleep AEDs during EEG study: GBP Technical aspects: This EEG study was done with scalp electrodes positioned according to the 10-20 International system of electrode placement. Electrical activity was acquired at a sampling rate of '500Hz'$  and reviewed with a high frequency filter of '70Hz'$  and a low frequency filter of '1Hz'$ . EEG data were recorded continuously and digitally stored. Description: The posterior dominant rhythm consists of 7.5 Hz activity of moderate voltage (25-35 uV) seen predominantly in posterior head regions, symmetric and reactive to eye opening and eye closing. Sleep was characterized by vertex waves, sleep spindles (12 to 14 Hz), maximal frontocentral region. EEG showed continuous generalized and maximal left temporoparietal 5-6 Hz theta slowing.  Intermittent left temporoparietal  2 to 3 Hz sharply contoured and rhythmic delta slowing was also noted.  Hyperventilation and photic stimulation were not performed.   ABNORMALITY - Continuous slow, generalized and maximal left temporoparietal region -Intermittent rhythmic slow, left temporoparietal region IMPRESSION: This study showed rhythmic delta activity  in left temporoparietal region which is on the ictal-interictal continuum with low potential for seizures. There is also cortical dysfunction in left temporoparietal region likely secondary to underlying structural abnormality/ mass. Additionally there is mild diffuse encephalopathy, nonspecific etiology.  No seizures or definite epileptiform discharges were seen throughout the recording. Lora Havens    IMPRESSION:  *70 year old male who presented with complaints of weakness and found to have a lesion on his brain that is suspicious for metastatic lesion.  This prompted other evaluation to look for the source and he was found to have liver mets as well.  MRI showing some small lymph nodes in the area of the gastroesophageal junction that are suspicious.  He underwent liver biopsy yesterday.  We are being asked to evaluate for EGD and colonoscopy.  No EGD or colonoscopy for 30+ years.  Does Cologuard regularly that has been negative.  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   *Chronic GERD:  Takes omeprazole daily at home, which works well for the most part.  Has some mild intermittent dysphagia that has been present for a long time.  PLAN: -After seeing the pathology report Dr. Tarri Glenn discussed with the primary attending physician.  Looks like cholangiocarcinoma.  No further role from a GI standpoint as LFTs are normal.  Need to get oncology involved.   Jason Snyder. Jason Snyder  01/25/2021, 12:56 PM

## 2021-01-25 NOTE — Progress Notes (Signed)
Pt seen on rounds, subjectively improving w/ regard to strength / ataxia, numbness. Still present but better. On exam, his drift is almost resolved and he's 4+/5 in the RUE/RLE. Chart reviewed, he has multiple hepatic lesions and potentially esophageal vs pancreatic primary. Either way, if the liver path comes back negative / non-dx then these are easily accessible regions compared to getting path from his brain met.   -plan on SRS for the brain met, will add to tumor board -discussed plan w/ pt -continue dex 2bid inpatient and after discharge -brain tumor coordinator will reach out to him for scheduling after discharge

## 2021-01-25 NOTE — Consult Note (Signed)
Referring Provider:  Dr. Dorris Singh Primary Care Physician:  [name], MD Primary Gastroenterologist:  Althia Forts  Reason for Consultation:  Metastatic cancer of unknown primary  HPI: Jason Snyder is a 70 y.o. male with PMH of HTN, CAD, COPD, HLD, DM2, OSA on CPAP, DDD, and GERD.  He presented with right-sided weakness.  Had imaging of his head/brain that showed a lesion suspicious for metastatic lesion.  This prompted several other imaging studies of his abdomen, etc.  Most recent imaging is MRI of the abdomen that showed the following:  IMPRESSION: 1. 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 Endoscopic assessment may be helpful for further evaluation. Small lymph nodes in the area of the gastroesophageal junction also suspicious. 2. Hepatic steatosis. 3. Cystic lesion in the head of the pancreas without high-risk features of ductal dilation or mural nodularity. 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 Consider short interval follow-up at 6 month interval common approach that may be more reasonable in the current context. Based on size a more aggressive approach would be endoscopic ultrasound and sampling. 4. Atherosclerosis with some narrowing of the origin of the SMA.   Aortic Atherosclerosis (ICD10-I70.0).  Lab work-up is unremarkable.  Patient is not anemic.  LFTs are normal.  He tells me that from a GI standpoint he has been having some upper abdominal discomfort just the last couple of days.  He has chronic longstanding GERD for which he takes omeprazole daily and that seems to help for the most part.  He has some intermittent dysphagia and points high up in his throat.  He says that that is been going on for a long time.  He has had no unintentional weight loss.  He had an EGD over 30 years ago when he was taking NSAIDs and was found to  have a bleeding ulcer.  That was performed in [place].  He also had a colonoscopy at that time and has not had one since.  He does do Cologuards regularly, however, and those have been negative according to his report.  He says that he moves his bowels regularly without any evidence of black or bloody stools.  He had a biopsy of the liver lesion yesterday.   Past Medical History:  Diagnosis Date   BMI 30.0-30.9,adult    BPH without obstruction/lower urinary tract symptoms 10/03/2017   CAD (coronary artery disease)    cath 2020, single vessel disease, medical mgt   Chronic left shoulder pain c   Chronic low back pain with right-sided sciatica    Chronic pain 10/07/2013   Combined hyperlipidemia associated with type 2 diabetes mellitus ([place]) 07/09/2019   Controlled type 2 diabetes mellitus without complication, without long-term current use of insulin ([place]) 07/12/2014   COPD (chronic obstructive pulmonary disease) ([place]) 01/07/2014   PFTs 07/2018 minimal airway obstruction, overinflation and no response to bronchodilators.    Coronary artery disease involving native coronary artery of native heart without angina pectoris    Cardiac Cath 01/2019: Severe single-vessel disease with additional moderate two-vessel disease: 100% CTO of RCA, moderate disease in proximal LCx, distal LCx and very small caliber 1st Diag DFR-FFR negative lesion in proximal LCx Normal LV pressures with previously document, ed normal EF.   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   Hypogonadism -  pineal glad abnormalities On testosterone replacement   Obesity (BMI 30-39.9)    Obesity (BMI 30.0-34.9) 10/03/2017   OSA on CPAP 10/07/2013   Primary osteoarthritis involving multiple joints 01/10/2015   Rosacea    Smoking greater than 30 pack years - quit 03/2018 10/03/2017   Thoracic and lumbosacral neuritis    Type 2 diabetes mellitus with peripheral neuropathy ([place]) 07/12/2014    Past Surgical History:  Procedure Laterality Date   ARTHROSCOPIC REPAIR ACL     CARDIAC CATHETERIZATION     INTRAVASCULAR PRESSURE WIRE/FFR STUDY N/A 01/01/2019   Procedure: INTRAVASCULAR PRESSURE WIRE/FFR STUDY;  Surgeon: Leonie Man, MD;  Location: [facility];  Service: Cardiovascular;  Laterality: N/A;   LEFT HEART CATH AND CORONARY ANGIOGRAPHY N/A 01/01/2019   Procedure: LEFT HEART CATH AND CORONARY ANGIOGRAPHY;  Surgeon: Leonie Man, MD;  Location: [facility];  Service: Cardiovascular;  Laterality: N/A;   PROSTATE CRYOABLATION     03/2019   SHOULDER ARTHROSCOPY     SPINE SURGERY      Prior to Admission medications   Medication Sig Start Date End Date Taking? Authorizing Provider  acetaminophen-codeine (TYLENOL #3) 300-30 MG tablet Take 1 tablet by mouth 2 (two) times daily as needed for moderate pain. 01/11/21  Yes [name], MD  acetic acid 2 % otic solution Place 4 drops into both ears 2 (two) times daily as needed. Patient taking differently: Place 4 drops into both ears 2 (two) times daily as needed (pain). 10/06/19  Yes [name], MD  albuterol (VENTOLIN HFA) 108 (90 Base) MCG/ACT inhaler Inhale 2 puffs by mouth into the lungs every 4 hours as needed for wheezing 12/05/20  Yes [name], MD  amLODipine (NORVASC) 10 MG tablet TAKE ONE TABLET BY MOUTH ONE TIME DAILY Patient taking differently: Take 10 mg by mouth daily. 11/17/20  Yes [name], MD  aspirin 81 MG EC tablet Take 81 mg by mouth daily.    Yes [provider]  atorvastatin (LIPITOR) 20 MG  tablet Take 1 tablet (20 mg total) by mouth at bedtime. 07/12/20  Yes [name], MD  ciprofloxacin-dexamethasone (CIPRODEX) OTIC suspension Place 4 drops into both ears 2 (two) times daily.   Yes [provider]  cyclobenzaprine (FLEXERIL) 10 MG tablet Take 1 tablet (10 mg total) by mouth 3 (three) times daily as needed for muscle spasms. 07/12/20  Yes [name], MD  diclofenac sodium (VOLTAREN) 1 % GEL Apply 2 g topically 4 (four) times daily as needed (pain). 04/21/17  Yes [provider]  FARXIGA 10 MG TABS tablet TAKE ONE TABLET BY MOUTH ONE TIME DAILY Patient taking differently: Take 10 mg by mouth daily. 08/28/20  Yes [name], MD  fluticasone [facility]) 50 MCG/ACT nasal spray Place 1 spray into the nose daily as needed for allergies.  04/09/13  Yes [provider]  gabapentin (NEURONTIN) 300 MG capsule Take 1 capsule (300 mg total) by mouth 3 (three) times daily. 01/11/21  Yes [name], MD  ibuprofen (ADVIL) 200 MG tablet Take 200 mg by mouth every 6 (six) hours as needed for  mild pain.   Yes [provider]  Loratadine 10 MG CAPS Take 10 mg by mouth daily as needed (allergies).    Yes [provider]  losartan-hydrochlorothiazide (HYZAAR) 100-25 MG tablet TAKE ONE TABLET BY MOUTH ONE TIME DAILY 10/26/20  Yes [name], MD  metFORMIN (GLUCOPHAGE) 500 MG tablet Take 1 tablet (500 mg total) by mouth 2 (two) times daily with a meal. 01/11/21  Yes [name], MD  metoprolol tartrate (LOPRESSOR) 50 MG tablet Take 1 tablet (50 mg total) by mouth 2 (two) times daily. 04/11/20  Yes [name], MD  Multiple Vitamin (MULTIVITAMIN) capsule Take 1 capsule by mouth daily.    Yes [provider]  nitroGLYCERIN (NITROSTAT) 0.4 MG SL tablet Place 1 tablet (0.4 mg total) under the tongue every 5 (five) minutes as needed for chest pain. 08/28/20  Yes Richardo Priest, MD  Spacer/Aero-Holding Chambers (AEROCHAMBER MV) inhaler Use  as instructed 11/20/20  Yes Magdalen Spatz, NP  tamsulosin (FLOMAX) 0.4 MG CAPS capsule TAKE TWO CAPSULES BY MOUTH DAILY Patient taking differently: Take 0.4 mg by mouth daily. 03/02/20  Yes [name], MD  testosterone cypionate (DEPOTESTOSTERONE CYPIONATE) 200 MG/ML injection INJECT 1ML INTO THE MUSCLE EVERY 14 DAYS Patient taking differently: Inject 200 mg into the muscle once a week. Friday 03/24/19  Yes [name], MD  Tiotropium Bromide-Olodaterol (STIOLTO RESPIMAT) 2.5-2.5 MCG/ACT AERS Inhale 2 puffs into the lungs daily. 10/10/20  Yes Mannam, Praveen, MD  Ketotifen Fumarate (ALLERGY EYE DROPS OP) Place 1 drop into both eyes daily as needed (allergy).    [provider]    Current Facility-Administered Medications  Medication Dose Route Frequency Provider Last Rate Last Admin   acetaminophen (TYLENOL) tablet 650 mg  650 mg Oral Q6H PRN Simmons-Robinson, Makiera, MD   650 mg at 01/25/21 [id number]   Or   acetaminophen (TYLENOL) suppository 650 mg  650 mg Rectal Q6H PRN Simmons-Robinson, Makiera, MD       acetic acid 2 % OTIC (EAR) solution 4 drop  4 drop Both EARS BID PRN Espinoza, Alejandra, DO       albuterol (PROVENTIL) (2.5 MG/3ML) 0.083% nebulizer solution 2.5 mg  2.5 mg Nebulization Q6H PRN Hammons, Kimberly B, RPH       arformoterol (BROVANA) nebulizer solution 15 mcg  15 mcg Nebulization BID Simmons-Robinson, Makiera, MD   15 mcg at 01/25/21 0815   And   umeclidinium bromide (INCRUSE ELLIPTA) 62.5 MCG/INH 1 puff  1 puff Inhalation Daily Simmons-Robinson, Makiera, MD   1 puff at 01/25/21 0818   atorvastatin (LIPITOR) tablet 20 mg  20 mg Oral QHS Simmons-Robinson, Makiera, MD   20 mg at 01/24/21 2223   ciprofloxacin-dexamethasone (CIPRODEX) 0.3-0.1 % OTIC (EAR) suspension 4 drop  4 drop Both EARS BID PRN Sharion Settler, DO       dapagliflozin propanediol (FARXIGA) tablet 10 mg  10 mg Oral Daily Heloise Purpura, RPH   10 mg at 01/25/21 1028   dexamethasone  (DECADRON) tablet 2 mg  2 mg Oral Q12H Espinoza, Alejandra, DO   2 mg at 01/25/21 1028   fluticasone (FLONASE) 50 MCG/ACT nasal spray 1 spray  1 spray Each Nare Daily PRN Simmons-Robinson, Makiera, MD       gabapentin (NEURONTIN) capsule 300 mg  300 mg Oral TID Simmons-Robinson, Makiera, MD   300 mg at 01/25/21 1028   heparin injection 5,000 Units  5,000 Units Subcutaneous Q8H Simmons-Robinson, Riki Sheer, MD  metFORMIN (GLUCOPHAGE) tablet 500 mg  500 mg Oral BID WC Simmons-Robinson, Makiera, MD   500 mg at 01/25/21 1028   metoprolol tartrate (LOPRESSOR) tablet 50 mg  50 mg Oral BID Simmons-Robinson, Makiera, MD   50 mg at 01/25/21 1027   nitroGLYCERIN (NITROSTAT) SL tablet 0.4 mg  0.4 mg Sublingual Q5 min PRN Simmons-Robinson, Makiera, MD       polyethylene glycol (MIRALAX / GLYCOLAX) packet 17 g  17 g Oral Daily PRN Simmons-Robinson, Makiera, MD       tamsulosin (FLOMAX) capsule 0.8 mg  0.8 mg Oral Daily Simmons-Robinson, Makiera, MD   0.8 mg at 01/25/21 1027    Allergies as of 01/22/2021 - Review Complete 01/22/2021  Allergen Reaction Noted   Rubus fruticosus Rash 04/21/2017    Family History  Problem Relation Age of Onset   Pulmonary embolism Mother    Heart disease Father    Lung cancer Father    Lung cancer Sister    Heart disease Maternal Uncle     Social History   Socioeconomic History   Marital status: Married    Spouse name: Not on file   Number of children: Not on file   Years of education: Not on file   Highest education level: Not on file  Occupational History   Occupation: retired  Tobacco Use   Smoking status: Former    Packs/day: 0.50    Years: 30.00    Pack years: 15.00    Types: Cigarettes    Quit date: 03/05/2018    Years since quitting: 2.8   Smokeless tobacco: Never   Tobacco comments:    no cigarettes since 03/05/18  Vaping Use   Vaping Use: Never used  Substance and Sexual Activity   Alcohol use: Not Currently   Drug use: Not Currently    Sexual activity: Not Currently  Other Topics Concern   Not on file  Social History Narrative   Not on file   Social Determinants of Health   Financial Resource Strain: Low Risk    Difficulty of Paying Living Expenses: Not hard at all  Food Insecurity: No Food Insecurity   Worried About Charity fundraiser in the Last Year: Never true   Arboriculturist in the Last Year: Never true  Transportation Needs: No Transportation Needs   Lack of Transportation (Medical): No   Lack of Transportation (Non-Medical): No  Physical Activity: Insufficiently Active   Days of Exercise per Week: 7 days   Minutes of Exercise per Session: 20 min  Stress: No Stress Concern Present   Feeling of Stress : Not at all  Social Connections: Moderately Isolated   Frequency of Communication with Friends and Family: More than three times a week   Frequency of Social Gatherings with Friends and Family: Once a week   Attends Religious Services: Never   Marine scientist or Organizations: No   Attends Music therapist: Never   Marital Status: Married  Human resources officer Violence: Not At Risk   Fear of Current or Ex-Partner: No   Emotionally Abused: No   Physically Abused: No   Sexually Abused: No    Review of Systems: ROS is O/W negative except as mentioned in HPI.  Physical Exam: Vital signs in last 24 hours: Temp:  [97.6 F (36.4 C)-98.2 F (36.8 C)] 97.9 F (36.6 C) (08/25 1139) Pulse Rate:  [64-80] 66 (08/25 1139) Resp:  [18-19] 18 (08/25 1139) BP: (107-154)/(57-97) 130/81 (08/25 1139) SpO2:  [  93 %-97 %] 94 % (08/25 1139) Last BM Date: 01/24/21 General:  Alert, Well-developed, well-nourished, pleasant and cooperative in NAD Head:  Normocephalic and atraumatic. Eyes:  Sclera clear, no icterus.  Conjunctiva pink. Ears:  Normal auditory acuity. Mouth:  No deformity or lesions.   Lungs:  Clear throughout to auscultation.  No wheezes, crackles, or rhonchi.  Heart:  Regular rate and  rhythm; no murmurs, clicks, rubs, or gallops. Abdomen:  Soft, non-distended.  BS present.  Minimal epigastric TTP.  Msk:  Symmetrical without gross deformities. Pulses:  Normal pulses noted. Extremities:  Without clubbing or edema. Neurologic:  Alert and oriented x 4;  grossly normal neurologically. Skin:  Intact without significant lesions or rashes. Psych:  Alert and cooperative. Normal mood and affect.  Intake/Output from previous day: 08/24 0701 - 08/25 0700 In: -  Out: 300 [Urine:300]  Lab Results: Recent Labs    01/23/21 0346  WBC 9.6  HGB 16.6  HCT 49.0  PLT 232   BMET Recent Labs    01/23/21 0346  NA 135  K 4.0  CL 99  CO2 27  GLUCOSE 152*  BUN 18  CREATININE 1.11  CALCIUM 9.2    Studies/Results: MR LIVER W WO CONTRAST  Result Date: 01/24/2021 CLINICAL DATA:  History of liver lesion discovered on a is CT of the abdomen and pelvis. EXAM: MRI ABDOMEN WITHOUT AND WITH CONTRAST TECHNIQUE: Multiplanar multisequence MR imaging of the abdomen was performed both before and after the administration of intravenous contrast. CONTRAST:  20m GADAVIST GADOBUTROL 1 MMOL/ML IV SOLN COMPARISON:  CT of the abdomen and pelvis of August 22, CT of the chest, abdomen and pelvis of January 22, 2021. FINDINGS: Lower chest: No sign of effusion or evidence of consolidative process at the lung bases. Limited assessment on MRI. Hepatobiliary: Signs of hepatic steatosis, mild. Lesion of concern in the area of the gallbladder fossa shows restricted diffusion and peripheral enhancement with washout. (Image 47/1) Multiple other foci of rim enhancement and restricted diffusion. (Image 38/71) 7 mm lesion in the lateral segment of the LEFT hepatic lobe. (Image 35/701 9 mm lesion in the RIGHT hepatic lobe, hepatic subsegment V/VIII. Small satellite lesions about the dominant lesion in hepatic subsegment IV/V. At least 8 additional lesions in the RIGHT hepatic lobe. Pancreas: Marked pancreatic atrophy.  Cystic lesion in the head of the pancreas measuring 2.8 x 2.4 cm without signs of pancreatic ductal dilation or signs of inflammation. No nodular enhancement of this lesion. Spleen:  Normal spleen. Adrenals/Urinary Tract: Adrenal glands are normal. Symmetric renal enhancement. No hydronephrosis. No suspicious renal lesion. Stomach/Bowel: Gastroesophageal thickening with masslike appearance. Small lymph node adjacent to the GE junction. Gastroesophageal abnormality best seen on coronal image eighty of series 8. No acute gastrointestinal process to the extent evaluated on abdominal MRI. Vascular/Lymphatic: Patent abdominal vasculature though atherosclerotic narrowing of the origin of the SMA and atherosclerotic plaque of the abdominal aorta without aneurysmal dilation. Small lymph nodes adjacent to the distal esophagus suspicious based on other findings outlined above. No retroperitoneal adenopathy. No gastrohepatic lymphadenopathy. Portal vein is patent into the liver. Hepatic arterial supply arises in a classic fashion from the celiac. Other:  No ascites. Musculoskeletal: No suspicious bone lesions identified. IMPRESSION: 1. 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 Endoscopic assessment may be helpful for further evaluation. Small lymph nodes in the area of the gastroesophageal junction also suspicious. 2. Hepatic steatosis. 3. Cystic lesion  in the head of the pancreas without high-risk features of ductal dilation or mural nodularity. 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 Consider short interval follow-up at 6 month interval common approach that may be more reasonable in the current context. Based on size a more aggressive approach would be endoscopic ultrasound and sampling. 4. Atherosclerosis with some narrowing of the origin of the SMA. Aortic Atherosclerosis (ICD10-I70.0).  Electronically Signed   By: Zetta Bills M.D.   On: 01/24/2021 08:37   US BIOPSY (LIVER)  Result Date: 01/24/2021 INDICATION: 70 year old male with indeterminate liver masses. EXAM: ULTRASOUND BIOPSY CORE LIVER MEDICATIONS: None. ANESTHESIA/SEDATION: Moderate (conscious) sedation was employed during this procedure. A total of Versed 1 mg and Fentanyl 50 mcg was administered intravenously. Moderate Sedation Time: 13 minutes. The patient's level of consciousness and vital signs were monitored continuously by radiology nursing throughout the procedure under my direct supervision. COMPLICATIONS: None immediate. PROCEDURE: Informed written consent was obtained from the patient after a thorough discussion of the procedural risks, benefits and alternatives. All questions were addressed. Maximal Sterile Barrier Technique was utilized including caps, mask, sterile gowns, sterile gloves, sterile drape, hand hygiene and skin antiseptic. A timeout was performed prior to the initiation of the procedure. Preprocedure ultrasound demonstrated safe window in the right upper quadrant for focal liver biopsy of anterior segment 4 hypodense mass measuring approximately 19 mm in maximum dimension. The right upper quadrant was prepped and draped in standard fashion. Local anesthesia was administered subdermally at the planned entry site as well as under ultrasound guidance along the hepatic capsule. A skin nick was made. A 17 gauge introducer needle was advanced to the hepatic parenchyma under ultrasound guidance. Next, a total of 2, 18 gauge core biopsies were obtained. The samples were placed in formalin and sent to Pathology. Under ultrasound guidance, a Gel-Foam slurry was administered along the needle entry tract as the introducer needle was withdrawn. Postprocedure ultrasound demonstrated no evidence of perihepatic fluid collection. The patient tolerated the procedure well. IMPRESSION: Technically successful ultrasound-guided  core liver mass biopsy from hepatic segment 4. Ruthann Cancer, MD [facility] Electronically Signed   By: Ruthann Cancer M.D.   On: 01/24/2021 11:11   EEG adult  Result Date: 01/23/2021 Lora Havens, MD     01/23/2021  4:08 PM Patient Name: Jason Snyder MRN: [id number] Epilepsy Attending: Lora Havens Referring Physician/Provider: Myra Rude, NP Date: 01/23/2021 Duration: 22.22 mins Patient history:  70 y.o. male with PMHx of CAD, HLD, DM2, COPD, HTN, OSA on CPAP, obesity, remote smoking history who presented to the ED 01/22/2021 for evaluation of right-sided weakness, numbness, and paresthesias. CTH was obtained revealing a left parietal mass with extensive vasogenic edema.  EEG to evaluate for seizures. Level of alertness: Awake, asleep AEDs during EEG study: GBP Technical aspects: This EEG study was done with scalp electrodes positioned according to the 10-20 International system of electrode placement. Electrical activity was acquired at a sampling rate of '500Hz'$  and reviewed with a high frequency filter of '70Hz'$  and a low frequency filter of '1Hz'$ . EEG data were recorded continuously and digitally stored. Description: The posterior dominant rhythm consists of 7.5 Hz activity of moderate voltage (25-35 uV) seen predominantly in posterior head regions, symmetric and reactive to eye opening and eye closing. Sleep was characterized by vertex waves, sleep spindles (12 to 14 Hz), maximal frontocentral region. EEG showed continuous generalized and maximal left temporoparietal 5-6 Hz theta slowing.  Intermittent left temporoparietal  2 to 3 Hz sharply contoured and rhythmic delta slowing was also noted.  Hyperventilation and photic stimulation were not performed.   ABNORMALITY - Continuous slow, generalized and maximal left temporoparietal region -Intermittent rhythmic slow, left temporoparietal region IMPRESSION: This study showed rhythmic delta activity  in left temporoparietal region which is on the ictal-interictal continuum with low potential for seizures. There is also cortical dysfunction in left temporoparietal region likely secondary to underlying structural abnormality/ mass. Additionally there is mild diffuse encephalopathy, nonspecific etiology.  No seizures or definite epileptiform discharges were seen throughout the recording. Lora Havens    IMPRESSION:  *70 year old male who presented with complaints of weakness and found to have a lesion on his brain that is suspicious for metastatic lesion.  This prompted other evaluation to look for the source and he was found to have liver mets as well.  MRI showing some small lymph nodes in the area of the gastroesophageal junction that are suspicious.  He underwent liver biopsy yesterday.  We are being asked to evaluate for EGD and colonoscopy.  No EGD or colonoscopy for 30+ years.  Does Cologuard regularly that has been negative.  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   *Chronic GERD:  Takes omeprazole daily at home, which works well for the most part.  Has some mild intermittent dysphagia that has been present for a long time.  PLAN: -After seeing the pathology report Dr. Tarri Glenn discussed with the primary attending physician.  Looks like cholangiocarcinoma.  No further role from a GI standpoint as LFTs are normal.  Need to get oncology involved.   Laban Emperor. Cassaundra Rasch  01/25/2021, 12:56 PM

## 2021-01-26 ENCOUNTER — Other Ambulatory Visit (HOSPITAL_COMMUNITY): Payer: Self-pay

## 2021-01-26 ENCOUNTER — Other Ambulatory Visit: Payer: Self-pay

## 2021-01-26 ENCOUNTER — Inpatient Hospital Stay (HOSPITAL_COMMUNITY): Payer: Medicare Other | Admitting: Certified Registered"

## 2021-01-26 ENCOUNTER — Encounter (HOSPITAL_COMMUNITY): Payer: Self-pay | Admitting: Student

## 2021-01-26 ENCOUNTER — Encounter (HOSPITAL_COMMUNITY)
Admission: EM | Disposition: A | Discharge status: Home / Self Care | Payer: Self-pay | Source: Home / Self Care | Attending: Family Medicine

## 2021-01-26 DIAGNOSIS — K2289 Other specified disease of esophagus: Secondary | ICD-10-CM

## 2021-01-26 DIAGNOSIS — C221 Intrahepatic bile duct carcinoma: Secondary | ICD-10-CM

## 2021-01-26 DIAGNOSIS — R200 Anesthesia of skin: Secondary | ICD-10-CM | POA: Diagnosis not present

## 2021-01-26 DIAGNOSIS — R531 Weakness: Secondary | ICD-10-CM | POA: Diagnosis not present

## 2021-01-26 DIAGNOSIS — G9389 Other specified disorders of brain: Secondary | ICD-10-CM | POA: Diagnosis not present

## 2021-01-26 DIAGNOSIS — K769 Liver disease, unspecified: Secondary | ICD-10-CM | POA: Diagnosis not present

## 2021-01-26 DIAGNOSIS — K209 Esophagitis, unspecified without bleeding: Secondary | ICD-10-CM

## 2021-01-26 HISTORY — PX: BIOPSY: SHX5522

## 2021-01-26 HISTORY — PX: ESOPHAGOGASTRODUODENOSCOPY (EGD) WITH PROPOFOL: SHX5813

## 2021-01-26 LAB — CBC
HCT: 45.7 % (ref 39.0–52.0)
Hemoglobin: 15.4 g/dL (ref 13.0–17.0)
MCH: 31.9 pg (ref 26.0–34.0)
MCHC: 33.7 g/dL (ref 30.0–36.0)
MCV: 94.6 fL (ref 80.0–100.0)
Platelets: 244 10*3/uL (ref 150–400)
RBC: 4.83 MIL/uL (ref 4.22–5.81)
RDW: 15.4 % (ref 11.5–15.5)
WBC: 8.1 10*3/uL (ref 4.0–10.5)
nRBC: 0 % (ref 0.0–0.2)

## 2021-01-26 LAB — COMPREHENSIVE METABOLIC PANEL
ALT: 36 U/L (ref 0–44)
AST: 24 U/L (ref 15–41)
Albumin: 3.6 g/dL (ref 3.5–5.0)
Alkaline Phosphatase: 43 U/L (ref 38–126)
Anion gap: 7 (ref 5–15)
BUN: 24 mg/dL — ABNORMAL HIGH (ref 8–23)
CO2: 29 mmol/L (ref 22–32)
Calcium: 8.7 mg/dL — ABNORMAL LOW (ref 8.9–10.3)
Chloride: 101 mmol/L (ref 98–111)
Creatinine, Ser: 1.06 mg/dL (ref 0.61–1.24)
GFR, Estimated: >60 mL/min (ref >60)
Glucose, Bld: 119 mg/dL — ABNORMAL HIGH (ref 70–99)
Potassium: 4.2 mmol/L (ref 3.5–5.1)
Sodium: 137 mmol/L (ref 135–145)
Total Bilirubin: 0.9 mg/dL (ref 0.3–1.2)
Total Protein: 6 g/dL — ABNORMAL LOW (ref 6.5–8.1)

## 2021-01-26 LAB — GLUCOSE, CAPILLARY
Glucose-Capillary: 149 mg/dL — ABNORMAL HIGH (ref 70–99)
Glucose-Capillary: 110 mg/dL — ABNORMAL HIGH (ref 70–99)

## 2021-01-26 SURGERY — ESOPHAGOGASTRODUODENOSCOPY (EGD) WITH PROPOFOL
Anesthesia: Monitor Anesthesia Care

## 2021-01-26 MED ORDER — BLOOD GLUCOSE MONITOR SYSTEM W/DEVICE KIT
PACK | 0 refills | Status: AC
  Filled 2021-01-26: qty 1, 30d supply, fill #0

## 2021-01-26 MED ORDER — ACCU-CHEK GUIDE VI STRP
ORAL_STRIP | 12 refills | Status: AC
  Filled 2021-01-26: qty 100, 25d supply, fill #0

## 2021-01-26 MED ORDER — ACCU-CHEK SOFTCLIX LANCETS MISC
5 refills | Status: AC
  Filled 2021-01-26: qty 100, 25d supply, fill #0

## 2021-01-26 MED ORDER — AMLODIPINE BESYLATE 10 MG PO TABS
10.0000 mg | ORAL_TABLET | Freq: Every day | ORAL | 0 refills | Status: AC
  Filled 2021-01-26: qty 90, 90d supply, fill #0

## 2021-01-26 MED ORDER — LACTATED RINGERS IV SOLN: INTRAVENOUS | Status: DC | PRN

## 2021-01-26 MED ORDER — PANTOPRAZOLE SODIUM 40 MG PO TBEC
40.0000 mg | DELAYED_RELEASE_TABLET | Freq: Every day | ORAL | 0 refills | Status: DC
  Filled 2021-01-26: qty 30, 30d supply, fill #0

## 2021-01-26 MED ORDER — DEXAMETHASONE 2 MG PO TABS
2.0000 mg | ORAL_TABLET | Freq: Two times a day (BID) | ORAL | 1 refills | Status: DC
  Filled 2021-01-26: qty 30, 15d supply, fill #0

## 2021-01-26 MED ORDER — PROPOFOL 500 MG/50ML IV EMUL
INTRAVENOUS | Status: DC | PRN
  Administered 2021-01-26: 150 ug/kg/min via INTRAVENOUS

## 2021-01-26 MED ORDER — PROPOFOL 10 MG/ML IV BOLUS
INTRAVENOUS | Status: DC | PRN
Start: 2021-01-26 — End: 2021-01-26
  Administered 2021-01-26: 50 mg via INTRAVENOUS

## 2021-01-26 MED ORDER — LIDOCAINE 2% (20 MG/ML) 5 ML SYRINGE
INTRAMUSCULAR | Status: DC | PRN
  Administered 2021-01-26: 100 mg via INTRAVENOUS

## 2021-01-26 SURGICAL SUPPLY — 15 items

## 2021-01-26 NOTE — Interval H&P Note (Signed)
History and Physical Interval Note:  01/26/2021 8:31 AM  Jason Snyder  has presented today for surgery, with the diagnosis of Dysphagia.   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  The various methods of treatment have been discussed with the patient and family. After consideration of risks, benefits and other options for treatment, the patient has consented to  Procedure(s): ESOPHAGOGASTRODUODENOSCOPY (EGD) WITH PROPOFOL (N/A) as a surgical intervention.  The patient's history has been reviewed, patient examined, no change in status, stable for surgery.  I have reviewed the patient's chart and labs.  Questions were answered to the patient's satisfaction.     [place]

## 2021-01-26 NOTE — Op Note (Signed)
[facility] Patient Name: Jason Snyder Procedure Date : 01/26/2021 MRN: [id number] Attending MD: Thornton Park MD, MD Date of Birth: 06-14-1950 CSN: [id number] Age: 70 Admit Type: Inpatient Procedure:                Upper GI endoscopy Indications:              Dysphagia, Abnormal MRI of the GI tract Providers:                Thornton Park MD, MD, Carmie End, RN, Michele Mcalpine, RN, Fransico Setters Mbumina, Technician Referring MD:              Medicines:                Monitored Anesthesia Care Complications:            No immediate complications. Estimated blood loss:                            Minimal. Estimated Blood Loss:     Estimated blood loss was minimal. Procedure:                Pre-Anesthesia Assessment:                           - Prior to the procedure, a History and Physical                            was performed, and patient medications and                            allergies were reviewed. The patient's tolerance of                            previous anesthesia was also reviewed. The risks                            and benefits of the procedure and the sedation                            options and risks were discussed with the patient.                            All questions were answered, and informed consent                            was obtained. Prior Anticoagulants: The patient has                            taken no previous anticoagulant or antiplatelet                            agents. ASA Grade Assessment: III - A patient with  severe systemic disease. After reviewing the risks                            and benefits, the patient was deemed in                            satisfactory condition to undergo the procedure.                           After obtaining informed consent, the endoscope was                            passed under direct vision. Throughout the                             procedure, the patient's blood pressure, pulse, and                            oxygen saturations were monitored continuously. The                            [id number]-[id number]) Olympus endoscope was introduced                            through the mouth, and advanced to the third part                            of duodenum. The upper GI endoscopy was                            accomplished without difficulty. The patient                            tolerated the procedure well. Scope In: Scope Out: Findings:      The distal esophageal lumen is deformed with some extrinisic compression.      A medium-sized, ulcerating, non-obstructing, not circumferential mass       was found in the distal esophagus, 40 cm from the incisors.      There is surrounding esophagitis. Biopsies were taken from the mass with       a cold forceps for histology. Estimated blood loss was minimal.      A small hiatal hernia was present.      The entire examined stomach was normal.      The examined duodenum was normal. 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 Biopsied.                           - Small hiatal hernia.                           - Normal stomach.                           -  Normal examined duodenum. Recommendation:           - Return patient to hospital ward for ongoing care.                           - Advance diet as tolerated.                           - Continue present medications.                           - Proton pump inhibitor therapy BID.                           - Await pathology results.                           - Oncology follow-up. Procedure Code(s):        --- Professional ---                           [phone], Esophagogastroduodenoscopy, flexible,                            transoral; with biopsy, single or multiple Diagnosis Code(s):        --- Professional ---                           D49.0,  Neoplasm of unspecified behavior of                            digestive system                           K44.9, Diaphragmatic hernia without obstruction or                            gangrene                           R13.10, Dysphagia, unspecified                           R93.3, Abnormal findings on diagnostic imaging of                            other parts of digestive tract CPT copyright 2019 [employer] Medical [employer]. All rights reserved. The codes documented in this report are preliminary and upon coder review may  be revised to meet current compliance requirements. Thornton Park MD, MD 01/26/2021 9:46:06 AM This report has been signed electronically. Number of Addenda: 0

## 2021-01-26 NOTE — Progress Notes (Signed)
Jason Snyder  D/C'd Home per MD order.  Discussed with the patient and all questions fully answered.  VSS, Skin clean, dry and intact without evidence of skin break down, no evidence of skin tears noted. IV catheter discontinued intact. Site without signs and symptoms of complications. Dressing and pressure applied.  An After Visit Summary was printed and given to the patient. Patient received prescription.  D/c education completed with patient/family including follow up instructions, medication list, d/c activities limitations if indicated, with other d/c instructions as indicated by MD - patient able to verbalize understanding, all questions fully answered.   Patient instructed to return to ED, call 911, or call MD for any changes in condition.   Patient escorted via [place], and D/C home via private auto at 1345.  [name] 01/26/2021 1:59 PM

## 2021-01-26 NOTE — Progress Notes (Signed)
I left a vm for [name] explaining who I am and why I am calling.  I told him I would call again on Monday 01/29/2021.  01/29/2021: I spoke with [name] this am. I reviewed my role as GI Oncology Nurse navigator. I reviewed the appt with Cassie and Dr Burr Medico for this Friday 9/2 at 1100.  He states that he will be at this appt.  I asked him to arrive at 1040 for registration purposes.  He verbalized understanding.

## 2021-01-26 NOTE — Discharge Summary (Signed)
Jason Snyder Discharge Summary  Patient name: Jason Snyder Medical record number: [id number] Date of birth: Oct 15, 1950 Age: 70 y.o. Gender: male Date of Admission: 01/22/2021  Date of Discharge: 01/26/2021 Admitting Physician: Jason Brill, DO  Primary Care Provider: [name], MD Consultants: Neurosurgery, IR, neurology, GI  Indication for Hospitalization: Right-sided weakness  Discharge Diagnoses/Problem List:  Active Problems:   Brain mass   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 structures ([place])   Cholangiocarcinoma ([place])   Esophageal dysphagia   Acute esophagitis   Esophageal mass    Disposition: Home  Discharge Condition: Stable  Discharge Exam:  Blood pressure (!) 155/87, pulse 64, temperature 97.6 F (36.4 C), temperature source Oral, resp. rate 19, height $RemoveBe'6\' 2"'PMWrMfzrt$  (1.88 m), weight 111.1 kg, SpO2 95 %.  General: Awake, alert and appropriately responsive in NAD HEENT: NCAT. EOMI, PERRL. Oropharynx clear Neck: Supple Chest: CTAB, normal WOB Heart: RRR, no murmur appreciated Abdomen: Soft, non-tender, non-distended. Normoactive bowel sounds. Extremities: Moves left extremities to full capacity.  4/5 strength in right-sided extremities.  Numbness of right hand.  Minimal numbness of right lower extremity. MSK: Normal bulk and tone Neuro: Appropriately responsive to stimuli. No gross deficits appreciated.  Lessened strength and numbness of right extremities as noted in extremities portion of exam Skin: No rashes or lesions appreciated.    Brief Snyder Course:  Jason Snyder is a 70 y.o. male who presented initially with right sided weakness. PMH is significant for HTN, CAD, COPD, HLD, T2DM, BPH s/p prostrate cryoablation. His Snyder course is outlined below.   Right Sided Weakness 2/2 left parietal mass  Pt presented initially to the ED with right-sided upper and lower extremity weakness and numbness that had progressively  worsened over a week.  CT head noted left parietal mass most concerning for metastasis.  MRI noted 1.9 cm mass which additionally favors metastatic disease or primary neoplasm. CT abdomen noted hypodensity within the right lobe of the liver which is concerning for neoplasm given intracranial findings. Per neurosurgery recs, we gave 10q6 dexamethasone x4 doses and then decreased to $RemoveBefo'2mg'AXszHQFhked$  BID. EEG performed by neurology showed no evidence of seizures. 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  Small lymph nodes in the area in the GE junction.  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 IR continued with a liver biopsy and results showed adenocarcinoma and was suspicious for cholangiocarcinoma.  LFTs have been largely within normal limits and no biliary obstruction is seen on imaging.  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 Biopsy is pending but patient was deemed to be stable for discharge and follow-up with oncology.  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  Issues for Follow Up:  Cystic abnormality at head of the pancreas follow-up imaging in 6 months if clinically appropriate at that time Plan on SRS with neurosurgery, brain tumor coordinator will reach out after discharge Consider PPI prophylaxis for long term dexamethasone treatment Discuss testosterone treatment with prescribing physician in the setting of newly diagnosis malignancy Reevaluate BP management given normal blood pressures on metoprolol alone. Held combination BP medication Follow-up patient's blood glucose levels on outpatient basis     Significant Procedures: EEG, liver biopsy, EGD with esophageal biopsy  Significant Labs  and Imaging:  Recent Labs  Lab 01/22/21 1219 01/23/21 0346 01/26/21 0030  WBC 7.5 9.6 8.1  HGB 17.0 16.6 15.4  HCT 50.8 49.0 45.7  PLT 251 232 244   Recent Labs  Lab 01/22/21 1219 01/23/21 0346 01/26/21 0030  NA 136 135 137  K 4.0 4.0 4.2  CL 97* 99 101  CO2 $Re'26 27 29  'swL$ GLUCOSE 126* 152* 119*  BUN 16 18 24*  CREATININE 1.09 1.11 1.06  CALCIUM 9.6 9.2 8.7*  ALKPHOS 58  --  43  AST 23  --  24  ALT 26  --  36  ALBUMIN 4.4  --  3.6   CBG (last 3)  Recent Labs    01/25/21 2153 01/26/21 0619 01/26/21 1133  GLUCAP 136* 149* 110*    Results/Tests Pending at Time of Discharge: Esophageal biopsy  Discharge Medications:  Allergies as of 01/26/2021       Reactions   Rubus Fruticosus Rash   Blackberries        Medication List     STOP taking these medications    ciprofloxacin-dexamethasone OTIC suspension Commonly known as: CIPRODEX   ibuprofen 200 MG tablet Commonly known as: ADVIL   losartan-hydrochlorothiazide 100-25 MG tablet Commonly known as: HYZAAR   testosterone cypionate 200 MG/ML injection Commonly known as: DEPOTESTOSTERONE CYPIONATE       TAKE these medications    Accu-Chek Guide test strip Generic drug: glucose blood Use as instructed up to 4 times daily   Accu-Chek Guide w/Device Kit Please check blood glucose three times daily (before meals in the morning and 2 hours after largest meals of the day OR lunch and dinner)   Accu-Chek Softclix Lancets lancets Use as directed up to 4 times daily   acetaminophen-codeine 300-30 MG tablet Commonly known as: TYLENOL #3 Take 1 tablet by mouth 2 (two) times daily as needed for moderate pain.   acetic acid 2 % otic solution Place 4 drops into both ears 2 (two) times daily as needed. What changed: reasons to take this   AeroChamber MV inhaler Use as instructed   albuterol 108 (90 Base) MCG/ACT inhaler Commonly known as: Ventolin HFA Inhale 2 puffs by mouth into the lungs every 4 hours as  needed for wheezing   ALLERGY EYE DROPS OP Place 1 drop into both eyes daily as needed (allergy).   amLODipine 10 MG tablet Commonly known as: NORVASC Take 1 tablet (10 mg total) by mouth daily.   aspirin 81 MG EC tablet Take 81 mg by mouth daily.   atorvastatin 20 MG tablet Commonly known as: LIPITOR Take 1 tablet (20 mg total) by mouth at bedtime.   cyclobenzaprine 10 MG tablet Commonly known as: FLEXERIL Take 1 tablet (10 mg total) by mouth 3 (three) times daily as needed for muscle spasms.   dexamethasone 2 MG tablet Commonly known as: DECADRON Take 1 tablet (2 mg total) by mouth every 12 (twelve) hours.   diclofenac sodium 1 % Gel Commonly known as: VOLTAREN Apply 2 g topically 4 (four) times daily as needed (pain).   Farxiga 10 MG Tabs tablet Generic drug: dapagliflozin propanediol TAKE ONE TABLET BY MOUTH ONE TIME DAILY What changed: how much to take   fluticasone 50 MCG/ACT nasal spray Commonly known as: FLONASE Place 1 spray into the nose daily as needed for allergies.   gabapentin 300 MG capsule Commonly known as: NEURONTIN Take 1 capsule (300 mg total) by mouth 3 (three) times daily.   Loratadine 10 MG Caps Take 10 mg by mouth daily as needed (allergies).  metFORMIN 500 MG tablet Commonly known as: GLUCOPHAGE Take 1 tablet (500 mg total) by mouth 2 (two) times daily with a meal.   metoprolol tartrate 50 MG tablet Commonly known as: LOPRESSOR Take 1 tablet (50 mg total) by mouth 2 (two) times daily.   multivitamin capsule Take 1 capsule by mouth daily.   nitroGLYCERIN 0.4 MG SL tablet Commonly known as: NITROSTAT Place 1 tablet (0.4 mg total) under the tongue every 5 (five) minutes as needed for chest pain.   pantoprazole 40 MG tablet Commonly known as: Protonix Take 1 tablet (40 mg total) by mouth daily.   Stiolto Respimat 2.5-2.5 MCG/ACT Aers Generic drug: Tiotropium Bromide-Olodaterol Inhale 2 puffs into the lungs daily.   tamsulosin  0.4 MG Caps capsule Commonly known as: FLOMAX TAKE TWO CAPSULES BY MOUTH DAILY What changed: how much to take        Discharge Instructions: Please refer to Patient Instructions section of EMR for full details.  Patient was counseled important signs and symptoms that should prompt return to medical care, changes in medications, dietary instructions, activity restrictions, and follow up appointments.   Follow-Up Appointments:  Follow-up Information     Jason Arnt, MD Follow up.   Specialty: Family Medicine Why: Call to schedule an appointment for Snyder follow up. Contact information: [address] [place] [address] [phone]         Truitt Merle, MD Follow up.   Specialties: Hematology, Oncology Why: The office should call you to arrange a follow up. Contact information: [place] [address] [phone]         [name], MD Follow up.   Specialty: Neurosurgery Why: Follow up as instructed. Contact information: [place] [address] [phone]         [facility] Follow up.   Why: The outpatient rehab will contact you for the first home visit Contact information: [address], [place] [address]  [place], [place], DO 01/26/2021, 8:10 PM PGY-1, [place]

## 2021-01-26 NOTE — Discharge Instructions (Addendum)
Dear Jason Snyder,  Thank you for letting us participate in your care. 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 During your hospitalization, you were evaluated by Neurosurgery who started you on steroids to help decrease the swelling in your brain. You should continue to take this twice daily until told otherwise. Interventional Radiology performed a liver biopsy which helped [place] determine the primary cause of all of this. You were also evaluated by Gastroenterology who performed an endoscopy. They were able to take biopsies and will discuss the findings with you once those return. We discussed your case with oncologist, Dr. Lindi Adie who will arrange outpatient follow up (with Dr. Burr Medico) to discuss treatment options. You also worked with both Physical Therapy and Occupational Therapy and were recommended for outpatient Rehab which you will do in [employer]. During your hospitalization, we  held some of your blood pressure medications. We have restarted you on Metoprolol and Amlodipine but recommend stopping Hyzaar until you follow up with your primary care physician.   POST-HOSPITAL & CARE INSTRUCTIONS AVOID Non-steroidal anti-inflammatory drugs such as Ibuprofen/Advil/Motrin Schedule a follow up appointment with your primary care physician for hospital follow up and blood pressure management. We have recommend stopping your Hyzaar until you follow up. We have started you on a new medication called Protonix, which is an anti-reflux medication.  Go to your scheduled Neurosurgery appointment below. Oncology will contact you for outpatient follow up.  Go to your follow up appointments (listed below)   DOCTOR'S APPOINTMENT   Future Appointments  Date Time Provider Standard  01/29/2021  7:00 AM [place] [facility] None  02/20/2021  9:00 AM GI-315 CT 1 GI-315CT [facility]  03/06/2021   2:15 PM Marshell Garfinkel, MD [facility] None  04/09/2021  9:00 AM [place], Earlean Polka, MD [facility] None  05/04/2021 10:00 AM [name], MD [facility]  07/13/2021  9:30 AM [facility] HEALTH COACH [facility]    Take care and be well!  [facility]  [place], [place] [address] [phone]

## 2021-01-26 NOTE — Transfer of Care (Signed)
Immediate Anesthesia Transfer of Care Note  Patient: Jason Snyder  Procedure(s) Performed: ESOPHAGOGASTRODUODENOSCOPY (EGD) WITH PROPOFOL BIOPSY  Patient Location: PACU  Anesthesia Type:MAC  Level of Consciousness: awake and patient cooperative  Airway & Oxygen Therapy: Patient Spontanous Breathing and Patient connected to nasal cannula oxygen  Post-op Assessment: Report given to RN and Post -op Vital signs reviewed and stable  Post vital signs: Reviewed and stable  Last Vitals:  Vitals Value Taken Time  BP 91/61 01/26/21 0934  Temp    Pulse 65 01/26/21 0936  Resp 13 01/26/21 0936  SpO2 95 % 01/26/21 0936  Vitals shown include unvalidated device data.  Last Pain:  Vitals:   01/26/21 0834  TempSrc: Temporal  PainSc: 3       Patients Stated Pain Goal: 1 (34/28/76 8115)  Complications: No notable events documented.

## 2021-01-26 NOTE — Progress Notes (Signed)
Physical Therapy Treatment Patient Details Name: Jason Snyder MRN: [id number] DOB: September 03, 1950 Today's Date: 01/26/2021    History of Present Illness Pt is a 71 y.o. male who presented 01/22/21 with R-sided weakness and numbness that has progressively worsened since the prior week. CT head noted left parietal mass most concerning for metastasis.  MRI notes 1.9 cm mass which additionally favors metastatic disease or primary neoplasm. CT abdomen notes hypodensity within the right lobe of the liver which is concerning for neoplasm given intracranial findings. S/p US guided liver mass biopsy 8/23. PMH: CAD, DM2, COPD, HTN    PT Comments    Patient progressing towards physical therapy goals. Patient modI for ambulation with RW. Improving R LE strength and foot clearance during mobility. Patient requires supervision for stairs due to feeling unsteadiness. D/c plan remains appropriate.    Follow Up Recommendations  Outpatient PT;Supervision for mobility/OOB     Equipment Recommendations  Rolling Tiffany Calmes with 5" wheels    Recommendations for Other Services       Precautions / Restrictions Precautions Precautions: Fall Restrictions Weight Bearing Restrictions: No    Mobility  Bed Mobility Overal bed mobility: Modified Independent                  Transfers Overall transfer level: Modified independent Equipment used: Rolling Ollis Daudelin (2 wheeled)                Ambulation/Gait Ambulation/Gait assistance: Modified independent (Device/Increase time) Gait Distance (Feet): 300 Feet Assistive device: Rolling Ivah Girardot (2 wheeled) Gait Pattern/deviations: Step-through pattern;Decreased stride length Gait velocity: decreased   General Gait Details: ambulated with RW with supervision. Improved R foot clearance this session. No LOB noted   Stairs Stairs: Yes Stairs assistance: Supervision Stair Management: One rail Left;One rail Right;Alternating pattern;Step to  pattern;Forwards Number of Stairs: 10 General stair comments: ascends with L hand rail and descends with R rail. On ascent, patient with alternating pattern. On descent, step to pattern leading with R on descent. Supervision for safety due to patient feeling unsteady   Wheelchair Mobility    Modified Rankin (Stroke Patients Only) Modified Rankin (Stroke Patients Only) Pre-Morbid Rankin Score: No symptoms Modified Rankin: Moderate disability     Balance Overall balance assessment: Mild deficits observed, not formally tested                                          Cognition Arousal/Alertness: Awake/alert Behavior During Therapy: WFL for tasks assessed/performed Overall Cognitive Status: Impaired/Different from baseline Area of Impairment: Problem solving                             Problem Solving: Difficulty sequencing        Exercises      General Comments        Pertinent Vitals/Pain Pain Assessment: No/denies pain    Home Living                      Prior Function            PT Goals (current goals can now be found in the care plan section) Acute Rehab PT Goals Patient Stated Goal: to figure out next steps PT Goal Formulation: With patient/family Time For Goal Achievement: 02/07/21 Potential to Achieve Goals: Good Progress towards PT goals: Progressing toward goals  Frequency    Min 4X/week      PT Plan Current plan remains appropriate    Co-evaluation              AM-PAC PT "6 Clicks" Mobility   Outcome Measure  Help needed turning from your back to your side while in a flat bed without using bedrails?: None Help needed moving from lying on your back to sitting on the side of a flat bed without using bedrails?: None Help needed moving to and from a bed to a chair (including a wheelchair)?: None Help needed standing up from a chair using your arms (e.g., wheelchair or bedside chair)?: None Help  needed to walk in hospital room?: None Help needed climbing 3-5 steps with a railing? : A Little 6 Click Score: 23    End of Session Equipment Utilized During Treatment: Gait belt Activity Tolerance: Patient tolerated treatment well Patient left: in bed;with call bell/phone within reach;with family/visitor present Nurse Communication: Mobility status PT Visit Diagnosis: Unsteadiness on feet (R26.81);Other abnormalities of gait and mobility (R26.89);Muscle weakness (generalized) (M62.81);History of falling (Z91.81);Difficulty in walking, not elsewhere classified (R26.2);Other symptoms and signs involving the nervous system (R29.898)     Time: 1036-1100 PT Time Calculation (min) (ACUTE ONLY): 24 min  Charges:  $Gait Training: 8-22 mins $Therapeutic Activity: 8-22 mins                     Nafisa Olds A. Gilford Rile PT, DPT Acute Rehabilitation Services Pager [phone] Office [phone]    Linna Hoff 01/26/2021, 11:13 AM

## 2021-01-26 NOTE — Evaluation (Signed)
Speech Language Pathology Evaluation and Treatment Patient Details Name: Jason Snyder MRN: [id number] DOB: 26-Oct-1950 Today's Date: 01/26/2021 Time: 1120-1150 SLP Time Calculation (min) (ACUTE ONLY): 30 min  Problem List:  Patient Active Problem List   Diagnosis Date Noted   Acute esophagitis    Esophageal mass    Cholangiocarcinoma ([place])    Esophageal dysphagia    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 structures ([place]) 01/23/2021   Brain mass 01/22/2021   Thoracic and lumbosacral neuritis    Obesity (BMI 30-39.9)    Hypertension    Hyperlipidemia    History of lumbar surgery    Diabetes mellitus without complication ([place])    DDD (degenerative disc disease), lumbar    Chronic low back pain with right-sided sciatica    Chronic left shoulder pain    CAD (coronary artery disease)    BMI 30.0-30.9,adult    Combined hyperlipidemia associated with type 2 diabetes mellitus ([place]) 07/09/2019   Coronary artery disease involving native coronary artery of native heart without angina pectoris    Encounter for screening for lung cancer 11/09/2018   Rosacea 10/03/2017   Smoking greater than 30 pack years - quit 03/2018 10/03/2017   BPH without obstruction/lower urinary tract symptoms 10/03/2017   Gastroesophageal reflux disease 01/10/2015   Primary osteoarthritis involving multiple joints 01/10/2015   Type 2 diabetes mellitus with peripheral neuropathy ([place]) 07/12/2014   Controlled type 2 diabetes mellitus without complication, without long-term current use of insulin ([place]) 07/12/2014   COPD (chronic obstructive pulmonary disease) ([place]) 01/07/2014   Chronic pain 10/07/2013   OSA on CPAP 10/07/2013   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   Essential hypertension 08/31/2010   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   Past Medical History:  Past Medical History:  Diagnosis Date   BMI 30.0-30.9,adult    BPH without obstruction/lower urinary tract symptoms 10/03/2017   CAD (coronary artery  disease)    cath 2020, single vessel disease, medical mgt   Chronic left shoulder pain c   Chronic low back pain with right-sided sciatica    Chronic pain 10/07/2013   Combined hyperlipidemia associated with type 2 diabetes mellitus ([place]) 07/09/2019   Controlled type 2 diabetes mellitus without complication, without long-term current use of insulin ([place]) 07/12/2014   COPD (chronic obstructive pulmonary disease) ([place]) 01/07/2014   PFTs 07/2018 minimal airway obstruction, overinflation and no response to bronchodilators.    Coronary artery disease involving native coronary artery of native heart without angina pectoris    Cardiac Cath 01/2019: Severe single-vessel disease with additional moderate two-vessel disease: 100% CTO of RCA, moderate disease in proximal LCx, distal LCx and very small caliber 1st Diag DFR-FFR negative lesion in proximal LCx Normal LV pressures with previously document, ed normal EF.   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   Encounter for screening for lung cancer 11/09/2018   Chest CT: neg lung cancer: + COPD changes and cardiovascular calcifications   Essential hypertension 08/31/2010   Elevated ASCVD risk - started lipitor 04/2013   Gastroesophageal reflux disease 01/10/2015   History of lumbar surgery    Hyperlipidemia    Hypertension    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   Hypogonadism - pineal glad abnormalities On testosterone replacement   Obesity (BMI 30-39.9)    Obesity (BMI 30.0-34.9) 10/03/2017   OSA on CPAP 10/07/2013   Primary osteoarthritis involving multiple joints 01/10/2015   Rosacea    Smoking greater  than 30 pack years - quit 03/2018 10/03/2017   Thoracic and lumbosacral neuritis    Type 2 diabetes mellitus with peripheral neuropathy ([place]) 07/12/2014   Past Surgical History:  Past Surgical History:  Procedure Laterality Date   ARTHROSCOPIC REPAIR ACL     CARDIAC  CATHETERIZATION     INTRAVASCULAR PRESSURE WIRE/FFR STUDY N/A 01/01/2019   Procedure: INTRAVASCULAR PRESSURE WIRE/FFR STUDY;  Surgeon: Leonie Man, MD;  Location: [facility];  Service: Cardiovascular;  Laterality: N/A;   LEFT HEART CATH AND CORONARY ANGIOGRAPHY N/A 01/01/2019   Procedure: LEFT HEART CATH AND CORONARY ANGIOGRAPHY;  Surgeon: Leonie Man, MD;  Location: [facility];  Service: Cardiovascular;  Laterality: N/A;   PROSTATE CRYOABLATION     03/2019   SHOULDER ARTHROSCOPY     SPINE SURGERY     HPI:      Assessment / Plan / Recommendation Clinical Impression  [name] was seen with sons present for a cognitive-linguistic evaluation with c/o word finding difficulty and memory loss. 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 He is now demonstrating moderate cognitive impairment with deficits in problem solving, calculations, and memory. No word finding errors were noted during this evaluation; however, son reports it is generally in conversation and when he is fatigued. 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 He is presently being treated for brain swelling, so suspect some of his cognitive function will return as swelling improves. In setting of a brain mass as secondary cancer, full cognitive recovery will depend on course of treatment. (Eval 11:20-11:35).  Pt was provided strategies for word finding, including to describe words by appearance, function, category in order to find the word of communicate effectively. This was rehearsed with pt with target words of pen, window, and cough. He was also provided memory strategies of writing things down, repeating them, and visualizing. He wrote his doctors names and all diagnoses so far down in his phone, as he has had trouble recalling them. No  further ST indicated during this hospitalization, however, he may benefit from OP ST s/p cancer treatment for cognition if deficits have no resolved. (Treatment 11:35-11:50)    SLP Assessment  SLP Recommendation/Assessment: Patient needs continued Speech Lanaguage Pathology Services SLP Visit Diagnosis: Frontal lobe and executive function deficit;Attention and concentration deficit;Cognitive communication deficit (R41.841)    Follow Up Recommendations  Outpatient SLP    Frequency and Duration           SLP Evaluation Cognition  Overall Cognitive Status: Impaired/Different from baseline Orientation Level: Oriented X4 Attention: Focused Focused Attention: Impaired Memory: Impaired Awareness: Appears intact Problem Solving: Impaired Problem Solving Impairment: Verbal complex;Functional complex Executive Function: Sequencing Sequencing: Impaired Safety/Judgment: Market researcher Comprehension Overall Auditory Comprehension: Appears within functional limits for tasks assessed Visual Recognition/Discrimination Discrimination: Within Function Limits Reading Comprehension Reading Status: Within funtional limits    Expression Expression Primary Mode of Expression: Verbal Verbal Expression Overall Verbal Expression: Appears within functional limits for tasks assessed Written Expression Dominant Hand: Right   Oral / Motor  Oral Motor/Sensory Function Overall Oral Motor/Sensory Function: Within functional limits Motor Speech Overall Motor Speech: Appears within functional limits for tasks assessed Respiration: Within functional limits Phonation: Normal Resonance: Within functional limits Articulation: Within functional limitis Intelligibility: Intelligible  Motor Planning: Witnin functional limits                     [employer]. Nazareth Kirk, M.S., [place] Pathologist Acute Rehabilitation Services Pager: [place] 01/26/2021, 12:04 PM

## 2021-01-26 NOTE — Progress Notes (Signed)
FMTS Attending Daily Note: Dorris Singh, MD  Team Pager [phone] Pager [phone]  I have seen and examined this patient, reviewed their chart. I have discussed this patient with the resident physician.  Will sign  resident note as able.  Patient seen after EGD He denies pain, nausea, vomiting, chest pain. Weakness similar to prior.   Vitals:   01/26/21 0943 01/26/21 1119  BP: 117/71 (!) 155/87  Pulse: 63 64  Resp: 11 19  Temp:  97.6 F (36.4 C)  SpO2: 100% 95%    Sitting up in bed, talking with sons.  Cardiac: Warm well perfused.  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  Weakness secondary to frontoparietal lesions, likely metastatic disease, two possible primary malignancies - Reviewed results - Will have Oncology follow up - Neurosurgery follow up arranged-- discharge on dexamethasone, may need PJP prophylaxis.  - PPI BID for primary esophageal malignancy and steroid use - Will prescribe glucometer as discharging on steroids and underlying type 2 diabetes  - Recommend repeat labs, restart antihypertensives as able - Discussed side effects of steroids and specific GI symptoms to return for (also discharging on aspirin given history of CAD).  Dorris Singh, MD  Family Medicine Teaching Service

## 2021-01-26 NOTE — TOC Transition Note (Signed)
Transition of Care [facility]) - CM/SW Discharge Note   Patient Details  Name: Jason Snyder MRN: [id number] Date of Birth: 12/01/1950  [facility]) CM/SW Contact:  Pollie Friar, RN Phone Number: 01/26/2021, 12:46 PM   Clinical Narrative:    Pt is discharging home with outpatient therapy. The outpatient therapy will contact the patient for the first appointment.  Medications for home to be delivered to the room per [place].  Pt has supervision at home and transportation to home.   Final next level of care: OP Rehab Barriers to Discharge: No Barriers Identified   Patient Goals and CMS Choice     Choice offered to / list presented to : Patient  Discharge Placement                       Discharge Plan and Services   Discharge Planning Services: CM Consult            DME Arranged: Gilford Rile rolling DME Agency: [employer] Date DME Agency Contacted: 01/25/21   Representative spoke with at DME Agency: [place] Interventions     Readmission Risk Interventions No flowsheet data found.

## 2021-01-26 NOTE — Plan of Care (Signed)
  Problem: Clinical Measurements: Goal: Ability to maintain clinical measurements within normal limits will improve Outcome: Progressing Goal: Will remain free from infection Outcome: Progressing   Problem: Education: Goal: Knowledge of General Education information will improve Description: Including pain rating scale, medication(s)/side effects and non-pharmacologic comfort measures Outcome: Progressing

## 2021-01-26 NOTE — Anesthesia Postprocedure Evaluation (Signed)
Anesthesia Post Note  Patient: Jason Snyder  Procedure(s) Performed: ESOPHAGOGASTRODUODENOSCOPY (EGD) WITH PROPOFOL BIOPSY     Patient location during evaluation: Endoscopy Anesthesia Type: MAC Level of consciousness: awake and sedated Pain management: pain level controlled Vital Signs Assessment: post-procedure vital signs reviewed and stable Respiratory status: spontaneous breathing Cardiovascular status: stable Postop Assessment: no apparent nausea or vomiting Anesthetic complications: no   No notable events documented.  Last Vitals:  Vitals:   01/26/21 0943 01/26/21 1119  BP: 117/71 (!) 155/87  Pulse: 63 64  Resp: 11 19  Temp:  36.4 C  SpO2: 100% 95%    Last Pain:  Vitals:   01/26/21 1119  TempSrc: Oral  PainSc:                  Huston Foley

## 2021-01-26 NOTE — Anesthesia Preprocedure Evaluation (Signed)
Anesthesia Evaluation  Patient identified by MRN, date of birth, ID band Patient awake    Reviewed: Allergy & Precautions, NPO status , Patient's Chart, lab work & pertinent test results  Airway Mallampati: I  TM Distance: >3 FB Neck ROM: Full    Dental  (+) Partial Upper, Partial Lower, Poor Dentition   Pulmonary former smoker,    Pulmonary exam normal        Cardiovascular hypertension, + CAD  Normal cardiovascular exam     Neuro/Psych  Neuromuscular disease negative psych ROS   GI/Hepatic Neg liver ROS, GERD  Medicated,  Endo/Other  diabetes, Type 2, Oral Hypoglycemic Agents  Renal/GU negative Renal ROS     Musculoskeletal   Abdominal (+) + obese,   Peds  Hematology negative hematology ROS (+)   Anesthesia Other Findings   Reproductive/Obstetrics                             Anesthesia Physical Anesthesia Plan  ASA: 3  Anesthesia Plan: MAC   Post-op Pain Management:    Induction: Intravenous  PONV Risk Score and Plan: 1 and Propofol infusion  Airway Management Planned: Natural Airway and Mask  Additional Equipment: None  Intra-op Plan:   Post-operative Plan:   Informed Consent: I have reviewed the patients History and Physical, chart, labs and discussed the procedure including the risks, benefits and alternatives for the proposed anesthesia with the patient or authorized representative who has indicated his/her understanding and acceptance.     Dental advisory given  Plan Discussed with: CRNA  Anesthesia Plan Comments:         Anesthesia Quick Evaluation

## 2021-01-27 ENCOUNTER — Encounter (HOSPITAL_COMMUNITY): Payer: Self-pay | Admitting: Gastroenterology

## 2021-01-29 ENCOUNTER — Telehealth: Payer: Self-pay

## 2021-01-29 ENCOUNTER — Other Ambulatory Visit: Payer: Self-pay

## 2021-01-29 ENCOUNTER — Telehealth: Payer: Self-pay | Admitting: Radiation Therapy

## 2021-01-29 DIAGNOSIS — C7931 Secondary malignant neoplasm of brain: Secondary | ICD-10-CM

## 2021-01-29 MED ORDER — ACETAMINOPHEN-CODEINE #3 300-30 MG PO TABS: 1.0000 | ORAL_TABLET | Freq: Two times a day (BID) | ORAL | 0 refills | Status: DC | PRN

## 2021-01-29 NOTE — Telephone Encounter (Signed)
Spoke with Mr. Marquard about his upcoming appointments with Dr. Isidore Moos and his radiation treatment planning. He was very happy with the plans in place and has my contact information in case he has any questions or conflicts.   Mont Dutton R.T.(R)(T) Radiation Special Procedures Navigator

## 2021-01-29 NOTE — Telephone Encounter (Signed)
Transition Care Management Follow-up Telephone Call Date of discharge and from where: 01/26/21 [facility] How have you been since you were released from the hospital? Pt states he is doing okay, using walker with wheels but still still having some numbness and tingling in arms and legs b Any questions or concerns? No  Items Reviewed: Did the pt receive and understand the discharge instructions provided? Yes  Medications obtained and verified? Yes  Other? No  Any new allergies since your discharge? No  Dietary orders reviewed? Yes Do you have support at home? Yes   Home Care and Equipment/Supplies: Were home health services ordered? no  Were any new equipment or medical supplies ordered?  Yes: rolling walker with 5" wheels  What is the name of the medical supply agency? [facility] Were you able to get the supplies/equipment? yes Do you have any questions related to the use of the equipment or supplies? No  Functional Questionnaire: (I = Independent and D = Dependent) ADLs: I with assistance  Bathing/Dressing- I  Meal Prep- I  Eating- I  Maintaining continence- I  Transferring/Ambulation- I  Managing Meds- I  Follow up appointments reviewed:  PCP Hospital f/u appt confirmed? Yes  Scheduled to see [name] PAC on 01/30/21 @ 11:30. [facility] f/u appt confirmed? Yes  pt aware of multiple upcoming appts Are transportation arrangements needed? No  If their condition worsens, is the pt aware to call PCP or go to the Emergency Dept.? Yes Was the patient provided with contact information for the PCP's office or ED? Yes Was to pt encouraged to call back with questions or concerns? Yes

## 2021-01-29 NOTE — Telephone Encounter (Signed)
Last refill - suppose to be sent in on 01/11/21 - was not filled correctly, "no print" instead of electronic.. patient needing refill - chronic medication Last OV 01/11/21 dx. DM

## 2021-01-29 NOTE — Telephone Encounter (Signed)
LAST APPOINTMENT DATE:  01/11/21  NEXT APPOINTMENT DATE: 01/30/21  MEDICATION:acetaminophen-codeine (TYLENOL #3) 300-30 MG tablet  PHARMACY:[facility] # [id number] - [place], [place]

## 2021-01-29 NOTE — Telephone Encounter (Signed)
Refill request sent to [facility]

## 2021-01-30 ENCOUNTER — Other Ambulatory Visit: Payer: Self-pay

## 2021-01-30 ENCOUNTER — Ambulatory Visit (INDEPENDENT_AMBULATORY_CARE_PROVIDER_SITE_OTHER): Payer: Medicare Other | Admitting: Physician Assistant

## 2021-01-30 ENCOUNTER — Encounter: Payer: Self-pay | Admitting: Physician Assistant

## 2021-01-30 VITALS — BP 147/81 | HR 75 | Temp 98.2°F | Ht 74.0 in | Wt 239.0 lb

## 2021-01-30 DIAGNOSIS — E119 Type 2 diabetes mellitus without complications: Secondary | ICD-10-CM

## 2021-01-30 DIAGNOSIS — D496 Neoplasm of unspecified behavior of brain: Secondary | ICD-10-CM

## 2021-01-30 DIAGNOSIS — I10 Essential (primary) hypertension: Secondary | ICD-10-CM

## 2021-01-30 DIAGNOSIS — C221 Intrahepatic bile duct carcinoma: Secondary | ICD-10-CM | POA: Diagnosis not present

## 2021-01-30 DIAGNOSIS — G9389 Other specified disorders of brain: Secondary | ICD-10-CM | POA: Diagnosis not present

## 2021-01-30 NOTE — Progress Notes (Signed)
Radiation Oncology         [phone] ________________________________  Initial Outpatient Consultation  Name: Jason Snyder MRN: [id number]  Date: 01/31/2021  DOB: August 13, 1950  CC:[name], MD  [name], MD   REFERRING PHYSICIAN: [name], MD  DIAGNOSIS:     ICD-10-CM   1. 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    HISTORY OF PRESENT ILLNESS::Jason Snyder is a 70 y.o. male who recently presented to the [facility] ED on 01/22/21 with right-sided, upper and lower extremity weakness and numbness that had progressively worsened over a week. Head CT performed during ED course (08/22) demonstrated a left parietal mass with extensive vasogenic edema; notably concerning for metastasis. Brain MRI conducted for further evaluation on that same date revealed a 1.9 cm mass within the left frontoparietal white matter with extensive surrounding edema; again noted to favor metastatic disease. (Patient was accordingly admitted for further evaluation).   To locate primary malignancy, CT of the chest abdomen and pelvis performed during ED course demonstrated an indeterminate hypodensity within the right lobe of the liver abutting the gallbladder fossa, noted as concerning for neoplasm given recent intracranial findings. MRI of the liver on 01/23/21 further demonstrated multiple hepatic lesions, and the dominant lesion visualized from previous CT as potentially from esophageal neoplasm; again noted suspicious for metastatic disease.   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 Subsequently, the patient underwent biopsy of the distal esophagus under the care of Dr. Tarri Glenn on 01/26/21.  Pathology from the procedure revealed adenocarcinoma (morphology noted to be most consistent with primary esophageal adenocarcinoma).   In most recent history, the patient met with [name] PA-C at [facility], on 01/30/21. During this visit, the patient reported that his lower right back felt very sore starting the prior evening; he recently received cortisone shots to this area which seem to help with his pain.  The patient reports that he has been receiving cortisone shots for a long time for chronic lower back pain which has been waxing and waning for years. The patient stated that he still has some right sided weakness and numbness after starting steroids, but this has improved on steroids.  He is taking dexamethasone 2 mg twice daily. He denied vision changes, recent falls, chest pain, or dyspnea. Of note: patient is currently using a walker.   He denies seizures.  He has mild headaches.  He denies nausea.  He has occasional dizziness.  He denies visual deficits.  He occasionally has word finding difficulty.  He reports a history of esophageal reflux.  He quit smoking in 2019.  PREVIOUS RADIATION THERAPY: No  PAST MEDICAL HISTORY:  has a past medical history of BMI 30.0-30.9,adult, BPH without obstruction/lower urinary tract symptoms (10/03/2017), CAD (coronary artery disease), Chronic left shoulder pain (c), Chronic low back pain with right-sided sciatica, Chronic pain (10/07/2013), Combined hyperlipidemia associated with type 2 diabetes mellitus ([place]) (07/09/2019), Controlled type 2 diabetes mellitus without complication, without long-term current use of insulin ([place]) (07/12/2014), COPD (chronic obstructive pulmonary disease) ([place]) (01/07/2014), Coronary artery disease involving native coronary artery of native heart without angina pectoris, DDD (degenerative disc disease), lumbar, Diabetes mellitus without complication ([place]), 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    PAST SURGICAL HISTORY: Past Surgical History:  Procedure Laterality Date   ARTHROSCOPIC REPAIR ACL     BIOPSY  01/26/2021   Procedure: BIOPSY;  Surgeon: Thornton Park, MD;  Location: [place];  Service: Gastroenterology;;   CARDIAC CATHETERIZATION     ESOPHAGOGASTRODUODENOSCOPY (EGD) WITH PROPOFOL N/A 01/26/2021   Procedure: ESOPHAGOGASTRODUODENOSCOPY (EGD) WITH PROPOFOL;  Surgeon: Thornton Park, MD;  Location: [place];  Service: Gastroenterology;  Laterality: N/A;   INTRAVASCULAR PRESSURE WIRE/FFR STUDY N/A 01/01/2019   Procedure: INTRAVASCULAR PRESSURE WIRE/FFR STUDY;  Surgeon: Leonie Man, MD;  Location: [place] [facility];  Service: Cardiovascular;  Laterality: N/A;   LEFT HEART CATH AND CORONARY ANGIOGRAPHY N/A 01/01/2019   Procedure: LEFT HEART CATH AND CORONARY ANGIOGRAPHY;  Surgeon: Leonie Man, MD;  Location: [place] [facility];  Service: Cardiovascular;  Laterality: N/A;   PROSTATE CRYOABLATION     03/2019   SHOULDER ARTHROSCOPY     SPINE SURGERY      FAMILY HISTORY: family history includes Heart disease in his father and maternal uncle; Lung cancer in his father and sister; Pulmonary embolism in his mother.  SOCIAL HISTORY:  reports that he quit smoking about 2 years ago. His smoking use included cigarettes. He has a 15.00 pack-year smoking history. He has never used smokeless tobacco. He reports that he does not  currently use alcohol. He reports that he does not currently use drugs.  ALLERGIES: Rubus fruticosus  MEDICATIONS:  Current Outpatient Medications  Medication Sig Dispense Refill   Accu-Chek Softclix Lancets lancets Use as directed up to 4 times daily 100 each 5   acetaminophen-codeine (TYLENOL #3) 300-30 MG tablet Take 1 tablet by mouth 2 (two) times daily as needed for moderate pain. 90 tablet 0   acetic acid 2 % otic solution Place 4 drops into both ears 2 (two) times daily as needed. (Patient taking differently: Place 4 drops into both ears 2 (two) times daily as needed (pain).) 15 mL 2   albuterol (VENTOLIN HFA) 108 (90 Base) MCG/ACT inhaler Inhale 2 puffs by mouth into the lungs every 4 hours as needed for wheezing 18 g 0   amLODipine (NORVASC) 10 MG tablet Take 1 tablet (10 mg total) by mouth daily. 90 tablet 0   aspirin 81 MG EC tablet Take 81 mg by mouth daily.      atorvastatin (LIPITOR) 20 MG tablet Take 1 tablet (20 mg total) by mouth at bedtime. 90 tablet 3   Blood Glucose Monitoring Suppl (BLOOD GLUCOSE MONITOR SYSTEM) w/Device KIT Please check blood glucose three times daily (before meals in the morning and 2 hours after largest meals of the day OR lunch and dinner) 1 kit 0   cyclobenzaprine (FLEXERIL) 10 MG tablet Take 1 tablet (10 mg total) by mouth 3 (three) times daily as needed for muscle spasms. 90 tablet 3   dexamethasone (DECADRON) 2 MG tablet Take 1 tablet (2 mg total) by mouth every 12 (twelve) hours. 30 tablet 1   diclofenac sodium (VOLTAREN) 1 % GEL Apply 2 g topically 4 (four) times daily as needed (pain).  FARXIGA 10 MG TABS tablet TAKE ONE TABLET BY MOUTH ONE TIME DAILY (Patient taking differently: Take 10 mg by mouth daily.) 90 tablet 3   fluticasone (FLONASE) 50 MCG/ACT nasal spray Place 1 spray into the nose daily as needed for allergies.      gabapentin (NEURONTIN) 300 MG capsule Take 1 capsule (300 mg total) by mouth 3 (three) times daily. 180 capsule 3    glucose blood (ACCU-CHEK GUIDE) test strip Use as instructed up to 4 times daily 100 each 12   Ketotifen Fumarate (ALLERGY EYE DROPS OP) Place 1 drop into both eyes daily as needed (allergy).     Loratadine 10 MG CAPS Take 10 mg by mouth daily as needed (allergies).      metFORMIN (GLUCOPHAGE) 500 MG tablet Take 1 tablet (500 mg total) by mouth 2 (two) times daily with a meal. 180 tablet 0   metoprolol tartrate (LOPRESSOR) 50 MG tablet Take 1 tablet (50 mg total) by mouth 2 (two) times daily. 180 tablet 3   Multiple Vitamin (MULTIVITAMIN) capsule Take 1 capsule by mouth daily.      nitroGLYCERIN (NITROSTAT) 0.4 MG SL tablet Place 1 tablet (0.4 mg total) under the tongue every 5 (five) minutes as needed for chest pain. 25 tablet 0   Spacer/Aero-Holding Chambers (AEROCHAMBER MV) inhaler Use as instructed 1 each 0   tamsulosin (FLOMAX) 0.4 MG CAPS capsule TAKE TWO CAPSULES BY MOUTH DAILY (Patient taking differently: Take 0.4 mg by mouth daily.) 180 capsule 0   Tiotropium Bromide-Olodaterol (STIOLTO RESPIMAT) 2.5-2.5 MCG/ACT AERS Inhale 2 puffs into the lungs daily. 4 g 11   pantoprazole (PROTONIX) 40 MG tablet Take 1 tablet (40 mg total) by mouth 2 (two) times daily before a meal. 60 tablet 3   No current facility-administered medications for this encounter.    REVIEW OF SYSTEMS:  As above.   PHYSICAL EXAM:  height is $RemoveB'6\' 2"'qxzrOfUm$  (1.88 m) and weight is 238 lb (108 kg). His temporal temperature is 97.6 F (36.4 C). His blood pressure is 153/87 (abnormal) and his pulse is 66. His respiration is 18 and oxygen saturation is 96%.   General: Alert and oriented, in no acute distress  HEENT: Head is normocephalic. Extraocular movements are intact.  Heart: Regular in rate and rhythm with no murmurs, rubs, or gallops. Chest: Clear to auscultation bilaterally, with no rhonchi, wheezes, or rales. Abdomen: Soft, nontender, nondistended, with no rigidity or guarding. Extremities: No cyanosis or edema. Skin: No  concerning lesions. Musculoskeletal:  right-sided weakness in upper and lower extremity noted on physical exam, able to ambulate with walker Neurologic: Cranial nerves II through XII are grossly intact.  Speech is fluent.   Psychiatric: Judgment and insight are intact. Affect is appropriate.   LABORATORY DATA:  Lab Results  Component Value Date   WBC 8.1 01/26/2021   HGB 15.4 01/26/2021   HCT 45.7 01/26/2021   MCV 94.6 01/26/2021   PLT 244 01/26/2021   CMP     Component Value Date/Time   NA 137 01/26/2021 0030   NA 135 12/24/2018 0945   K 4.2 01/26/2021 0030   CL 101 01/26/2021 0030   CO2 29 01/26/2021 0030   GLUCOSE 119 (H) 01/26/2021 0030   BUN 24 (H) 01/26/2021 0030   BUN 10 12/24/2018 0945   CREATININE 1.06 01/26/2021 0030   CREATININE 1.02 04/11/2020 0824   CALCIUM 8.7 (L) 01/26/2021 0030   PROT 6.0 (L) 01/26/2021 0030   ALBUMIN 3.6 01/26/2021 0030  AST 24 01/26/2021 0030   ALT 36 01/26/2021 0030   ALKPHOS 43 01/26/2021 0030   BILITOT 0.9 01/26/2021 0030   GFRNONAA >60 01/26/2021 0030   GFRNONAA 76 04/11/2020 0824   GFRAA 88 04/11/2020 0824         RADIOGRAPHY: CT HEAD WO CONTRAST  Result Date: 01/22/2021 CLINICAL DATA:  Neuro deficit, acute, stroke suspected. Right arm and leg numbness. EXAM: CT HEAD WITHOUT CONTRAST TECHNIQUE: Contiguous axial images were obtained from the base of the skull through the vertex without intravenous contrast. COMPARISON:  None. FINDINGS: Brain: There is a 1.3 cm mass in the left parietal lobe with extensive surrounding vasogenic edema. The mass is heterogeneously hyperdense which may reflect hypercellularity or hemorrhage. There is regional sulcal effacement and mass effect on the left lateral ventricle without midline shift. No second lesion is identified on this unenhanced study. No acute cortically based infarct or extra-axial fluid collection is evident. Vascular: Calcified atherosclerosis at the skull base. No hyperdense vessel.  Skull: No fracture or suspicious osseous lesion. Sinuses/Orbits: Mild right frontal and ethmoid sinus mucosal thickening. Trace left mastoid effusion. Unremarkable orbits. Other: None. IMPRESSION: Left parietal mass with extensive vasogenic edema most concerning for a metastasis. Brain MRI without and with contrast is recommended for further evaluation. These results were called by telephone at the time of interpretation on 01/22/2021 at 1:56 pm to provider Alyse Low, who verbally acknowledged these results. Electronically Signed   By: Logan Bores M.D.   On: 01/22/2021 13:57   CT Chest W Contrast  Result Date: 01/22/2021 CLINICAL DATA:  Intracranial metastases without known primary malignancy EXAM: CT CHEST, ABDOMEN, AND PELVIS WITH CONTRAST TECHNIQUE: Multidetector CT imaging of the chest, abdomen and pelvis was performed following the standard protocol during bolus administration of intravenous contrast. CONTRAST:  167mL OMNIPAQUE IOHEXOL 300 MG/ML  SOLN COMPARISON:  11/03/2020 FINDINGS: CT CHEST FINDINGS Cardiovascular: The heart and great vessels are grossly unremarkable. There is trace pericardial fluid anteriorly. Moderate atherosclerosis of the aorta, great vessel origins, and coronary vasculature. Mediastinum/Nodes: No enlarged mediastinal, hilar, or axillary lymph nodes. Thyroid gland, trachea, and esophagus demonstrate no significant findings. Small hiatal hernia. Lungs/Pleura: No acute airspace disease, effusion, or pneumothorax. No pulmonary nodules or masses. The central airways are widely patent. Musculoskeletal: No acute or destructive bony lesions. Reconstructed images demonstrate no additional findings. CT ABDOMEN PELVIS FINDINGS Hepatobiliary: 1.3 cm cyst is identified within the superior aspect right lobe liver image 52/3. There is a 1.5 cm indeterminate hypodensity within the right lobe liver adjacent to the gallbladder fossa, reference image 63/3, concerning for metastatic disease given  intracranial findings. No other focal liver abnormalities are observed. Gallbladder is unremarkable. 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 Lobular cystic structures seen in the region of the uncinate process of the pancreas, measuring 2.9 x 2.5 cm. This cystic mass demonstrates no internal nodularity or septations, most consistent with sequela of chronic pancreatitis and small pseudocyst. Spleen: Normal in size without focal abnormality. Adrenals/Urinary Tract: The kidneys enhance normally and symmetrically. The bladder is severely distended, which could be due to chronic bladder outlet obstruction. Mild thickening of the body of the left adrenal gland measuring 7 mm without focal abnormality. Right adrenal is normal. Stomach/Bowel: No bowel obstruction or ileus. The appendix, if still present, is not well visualized. No bowel wall thickening or inflammatory change. Vascular/Lymphatic: There is extensive atherosclerosis of the aorta and its branches. Significant calcified  plaque within the bilateral common femoral arteries causes greater than 90% stenosis. No pathologic adenopathy within the abdomen or pelvis. Reproductive: Prostate is mildly enlarged measuring 5.8 x 4.0 by 4.8 cm. Asymmetric hypoattenuation of the peripheral zone of the right aspect of the prostate. Please correlate with physical exam findings and serum PSA. Other: No free fluid or free gas.  No abdominal wall hernia. Musculoskeletal: No acute bony abnormalities. There is prominent spondylosis at the L3-4 level. Benign-appearing lucency within the right iliac crest with well-circumscribed margins. Reconstructed images demonstrate no additional findings. IMPRESSION: 1. Indeterminate hypodensity within the right lobe liver abutting the gallbladder fossa, concerning for neoplasm given intracranial findings. If tissue diagnosis is desired, image guided  biopsy could be considered. Given the decreased conspicuity on delayed imaging, follow-up liver ultrasound with possible ultrasound-guided biopsy could be considered. Alternatively, dedicated liver MRI could be considered. 2. Enlarged prostate, with asymmetric decreased attenuation of the right peripheral zone of the prostate. Please correlate with serum PSA and physical exam findings. 3. Distended urinary bladder, which may reflect chronic bladder outlet obstruction given the enlarged prostate. 4. 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 5. No acute intrathoracic process. 6. Aortic Atherosclerosis (ICD10-I70.0). Severe atherosclerosis of the bilateral common femoral arteries with greater than 90% stenosis. Electronically Signed   By: Randa Ngo M.D.   On: 01/22/2021 17:26   MR Brain W Wo Contrast  Result Date: 02/02/2021 CLINICAL DATA:  70 year old male with left parietal lobe mass and cerebral edema diagnosed by CT last month in the setting of right extremity numbness. Evidence of liver metastases, status post ultrasound-guided needle biopsy revealing adenocarcinoma. Esophageal carcinoma suspected. Metastatic treatment planning. EXAM: MRI HEAD WITHOUT AND WITH CONTRAST TECHNIQUE: Multiplanar, multiecho pulse sequences of the brain and surrounding structures were obtained without and with intravenous contrast. CONTRAST:  65mL MULTIHANCE GADOBENATE DIMEGLUMINE 529 MG/ML IV SOLN COMPARISON:  Brain MRI 01/22/2021. FINDINGS: Brain: Mildly lobulated up to 21 mm diameter enhancing mass in the left parietal lobe has mildly spiculated margins and is stable and size and morphology from last month. Regional vasogenic edema has not significantly changed. Stable mild regional mass effect. A 2nd punctate enhancing lesion in the medial left occipital lobe is more conspicuous on black blood  postcontrast imaging today measuring 2-3 mm on series 11, image 64. This resemble vascular enhancement on the recent comparison but is most compatible with a punctate 2nd metastasis. No edema or mass effect. No other No abnormal enhancement identified. No suspicious dural thickening. No superimposed restricted diffusion to suggest acute infarction. No midline shift, ventriculomegaly, extra-axial collection or acute intracranial hemorrhage. Cervicomedullary junction and pituitary are within normal limits. No chronic cerebral blood products or cortical encephalomalacia identified. Scattered small mostly subcortical white matter nonspecific T2 and FLAIR hyperintensity is stable and mild for age. Vascular: Major intracranial vascular flow voids are stable with dominant right vertebral artery. Skull and upper cervical spine: Visualized bone marrow signal is within normal limits. Grossly negative visible cervical spine. Sinuses/Orbits: Negative. Other: Visible internal auditory structures appear normal. Negative visible scalp soft tissues. IMPRESSION: Positive for two brain metastases: - the dominant 21 mm left parietal lobe metastasis with stable regional edema and mild regional mass effect. - punctate enhancing metastasis in the medial left occipital lobe. Stable left parietal lobe metastasis with mild regional mass effect and vasogenic edema. Both lesions annotated on series 11. Electronically Signed   By:  Genevie Ann M.D.   On: 02/02/2021 06:13   MR Brain W and Wo Contrast  Result Date: 01/22/2021 CLINICAL DATA:  Right-sided weakness, brain mass on CT EXAM: MRI HEAD WITHOUT AND WITH CONTRAST TECHNIQUE: Multiplanar, multiecho pulse sequences of the brain and surrounding structures were obtained without and with intravenous contrast. CONTRAST:  4mL GADAVIST GADOBUTROL 1 MMOL/ML IV SOLN COMPARISON:  Correlation made with same day CT head FINDINGS: Brain: There is a heterogeneously enhancing frontoparietal mass within  the left centrum semiovale with extensive surrounding edema. Mass measures 1.6 x 1.6 x 1.9 cm. No associated reduced diffusion. Susceptibility is present reflecting intralesional blood products or mineralization. No additional mass or abnormal enhancement. Edema causes mild regional mass effect. Vascular: Major vessel flow voids at the skull base are preserved. Skull and upper cervical spine: Normal marrow signal is preserved. Sinuses/Orbits: Minor mucosal thickening.  Orbits are unremarkable. Other: Sella is partially empty. Minimal patchy mastoid fluid opacification. IMPRESSION: 1.9 cm mass within the left frontoparietal white matter with extensive surrounding edema. Favor metastatic disease over primary neoplasm. Electronically Signed   By: Macy Mis M.D.   On: 01/22/2021 16:39   CT ABDOMEN PELVIS W CONTRAST  Result Date: 01/22/2021 CLINICAL DATA:  Intracranial metastases without known primary malignancy EXAM: CT CHEST, ABDOMEN, AND PELVIS WITH CONTRAST TECHNIQUE: Multidetector CT imaging of the chest, abdomen and pelvis was performed following the standard protocol during bolus administration of intravenous contrast. CONTRAST:  125mL OMNIPAQUE IOHEXOL 300 MG/ML  SOLN COMPARISON:  11/03/2020 FINDINGS: CT CHEST FINDINGS Cardiovascular: The heart and great vessels are grossly unremarkable. There is trace pericardial fluid anteriorly. Moderate atherosclerosis of the aorta, great vessel origins, and coronary vasculature. Mediastinum/Nodes: No enlarged mediastinal, hilar, or axillary lymph nodes. Thyroid gland, trachea, and esophagus demonstrate no significant findings. Small hiatal hernia. Lungs/Pleura: No acute airspace disease, effusion, or pneumothorax. No pulmonary nodules or masses. The central airways are widely patent. Musculoskeletal: No acute or destructive bony lesions. Reconstructed images demonstrate no additional findings. CT ABDOMEN PELVIS FINDINGS Hepatobiliary: 1.3 cm cyst is identified  within the superior aspect right lobe liver image 52/3. There is a 1.5 cm indeterminate hypodensity within the right lobe liver adjacent to the gallbladder fossa, reference image 63/3, concerning for metastatic disease given intracranial findings. No other focal liver abnormalities are observed. Gallbladder is unremarkable. 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 Lobular cystic structures seen in the region of the uncinate process of the pancreas, measuring 2.9 x 2.5 cm. This cystic mass demonstrates no internal nodularity or septations, most consistent with sequela of chronic pancreatitis and small pseudocyst. Spleen: Normal in size without focal abnormality. Adrenals/Urinary Tract: The kidneys enhance normally and symmetrically. The bladder is severely distended, which could be due to chronic bladder outlet obstruction. Mild thickening of the body of the left adrenal gland measuring 7 mm without focal abnormality. Right adrenal is normal. Stomach/Bowel: No bowel obstruction or ileus. The appendix, if still present, is not well visualized. No bowel wall thickening or inflammatory change. Vascular/Lymphatic: There is extensive atherosclerosis of the aorta and its branches. Significant calcified plaque within the bilateral common femoral arteries causes greater than 90% stenosis. No pathologic adenopathy within the abdomen or pelvis. Reproductive: Prostate is mildly enlarged measuring 5.8 x 4.0 by 4.8 cm. Asymmetric hypoattenuation of the peripheral zone of the right aspect of the prostate. Please correlate with physical exam findings and serum PSA. Other: No free fluid or free  gas.  No abdominal wall hernia. Musculoskeletal: No acute bony abnormalities. There is prominent spondylosis at the L3-4 level. Benign-appearing lucency within the right iliac crest with well-circumscribed margins. Reconstructed images demonstrate  no additional findings. IMPRESSION: 1. Indeterminate hypodensity within the right lobe liver abutting the gallbladder fossa, concerning for neoplasm given intracranial findings. If tissue diagnosis is desired, image guided biopsy could be considered. Given the decreased conspicuity on delayed imaging, follow-up liver ultrasound with possible ultrasound-guided biopsy could be considered. Alternatively, dedicated liver MRI could be considered. 2. Enlarged prostate, with asymmetric decreased attenuation of the right peripheral zone of the prostate. Please correlate with serum PSA and physical exam findings. 3. Distended urinary bladder, which may reflect chronic bladder outlet obstruction given the enlarged prostate. 4. 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 5. No acute intrathoracic process. 6. Aortic Atherosclerosis (ICD10-I70.0). Severe atherosclerosis of the bilateral common femoral arteries with greater than 90% stenosis. Electronically Signed   By: Randa Ngo M.D.   On: 01/22/2021 17:26   MR LIVER W WO CONTRAST  Result Date: 01/24/2021 CLINICAL DATA:  History of liver lesion discovered on a is CT of the abdomen and pelvis. EXAM: MRI ABDOMEN WITHOUT AND WITH CONTRAST TECHNIQUE: Multiplanar multisequence MR imaging of the abdomen was performed both before and after the administration of intravenous contrast. CONTRAST:  66mL GADAVIST GADOBUTROL 1 MMOL/ML IV SOLN COMPARISON:  CT of the abdomen and pelvis of August 22, CT of the chest, abdomen and pelvis of January 22, 2021. FINDINGS: Lower chest: No sign of effusion or evidence of consolidative process at the lung bases. Limited assessment on MRI. Hepatobiliary: Signs of hepatic steatosis, mild. Lesion of concern in the area of the gallbladder fossa shows restricted diffusion and peripheral enhancement with washout. (Image 47/1)  Multiple other foci of rim enhancement and restricted diffusion. (Image 38/71) 7 mm lesion in the lateral segment of the LEFT hepatic lobe. (Image 35/701 9 mm lesion in the RIGHT hepatic lobe, hepatic subsegment V/VIII. Small satellite lesions about the dominant lesion in hepatic subsegment IV/V. At least 8 additional lesions in the RIGHT hepatic lobe. Pancreas: Marked pancreatic atrophy. Cystic lesion in the head of the pancreas measuring 2.8 x 2.4 cm without signs of pancreatic ductal dilation or signs of inflammation. No nodular enhancement of this lesion. Spleen:  Normal spleen. Adrenals/Urinary Tract: Adrenal glands are normal. Symmetric renal enhancement. No hydronephrosis. No suspicious renal lesion. Stomach/Bowel: Gastroesophageal thickening with masslike appearance. Small lymph node adjacent to the GE junction. Gastroesophageal abnormality best seen on coronal image eighty of series 8. No acute gastrointestinal process to the extent evaluated on abdominal MRI. Vascular/Lymphatic: Patent abdominal vasculature though atherosclerotic narrowing of the origin of the SMA and atherosclerotic plaque of the abdominal aorta without aneurysmal dilation. Small lymph nodes adjacent to the distal esophagus suspicious based on other findings outlined above. No retroperitoneal adenopathy. No gastrohepatic lymphadenopathy. Portal vein is patent into the liver. Hepatic arterial supply arises in a classic fashion from the celiac. Other:  No ascites. Musculoskeletal: No suspicious bone lesions identified. IMPRESSION: 1. 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 Endoscopic assessment may be helpful for further evaluation. Small lymph nodes in the area of the gastroesophageal junction also suspicious. 2. Hepatic steatosis. 3. Cystic lesion in the head of the pancreas without high-risk features of ductal dilation or mural  nodularity.  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 Consider short interval follow-up at 6 month interval common approach that may be more reasonable in the current context. Based on size a more aggressive approach would be endoscopic ultrasound and sampling. 4. Atherosclerosis with some narrowing of the origin of the SMA. Aortic Atherosclerosis (ICD10-I70.0). Electronically Signed   By: Zetta Bills M.D.   On: 01/24/2021 08:37   US BIOPSY (LIVER)  Result Date: 01/24/2021 INDICATION: 70 year old male with indeterminate liver masses. EXAM: ULTRASOUND BIOPSY CORE LIVER MEDICATIONS: None. ANESTHESIA/SEDATION: Moderate (conscious) sedation was employed during this procedure. A total of Versed 1 mg and Fentanyl 50 mcg was administered intravenously. Moderate Sedation Time: 13 minutes. The patient's level of consciousness and vital signs were monitored continuously by radiology nursing throughout the procedure under my direct supervision. COMPLICATIONS: None immediate. PROCEDURE: Informed written consent was obtained from the patient after a thorough discussion of the procedural risks, benefits and alternatives. All questions were addressed. Maximal Sterile Barrier Technique was utilized including caps, mask, sterile gowns, sterile gloves, sterile drape, hand hygiene and skin antiseptic. A timeout was performed prior to the initiation of the procedure. Preprocedure ultrasound demonstrated safe window in the right upper quadrant for focal liver biopsy of anterior segment 4 hypodense mass measuring approximately 19 mm in maximum dimension. The right upper quadrant was prepped and draped in standard fashion. Local anesthesia was administered subdermally at the planned entry site as well as under ultrasound guidance along the hepatic capsule. A skin nick was made. A 17 gauge introducer needle was advanced to the hepatic parenchyma under ultrasound guidance. Next, a total of 2, 18 gauge core  biopsies were obtained. The samples were placed in formalin and sent to Pathology. Under ultrasound guidance, a Gel-Foam slurry was administered along the needle entry tract as the introducer needle was withdrawn. Postprocedure ultrasound demonstrated no evidence of perihepatic fluid collection. The patient tolerated the procedure well. IMPRESSION: Technically successful ultrasound-guided core liver mass biopsy from hepatic segment 4. Ruthann Cancer, MD [facility] Electronically Signed   By: Ruthann Cancer M.D.   On: 01/24/2021 11:11   EEG adult  Result Date: 01/23/2021 Lora Havens, MD     01/23/2021  4:08 PM Patient Name: Jason Snyder MRN: [id number] Epilepsy Attending: Lora Havens Referring Physician/Provider: Myra Rude, NP Date: 01/23/2021 Duration: 22.22 mins Patient history:  70 y.o. male with PMHx of CAD, HLD, DM2, COPD, HTN, OSA on CPAP, obesity, remote smoking history who presented to the ED 01/22/2021 for evaluation of right-sided weakness, numbness, and paresthesias. CTH was obtained revealing a left parietal mass with extensive vasogenic edema.  EEG to evaluate for seizures. Level of alertness: Awake, asleep AEDs during EEG study: GBP Technical aspects: This EEG study was done with scalp electrodes positioned according to the 10-20 International system of electrode placement. Electrical activity was acquired at a sampling rate of $Remov'500Hz'winiyd$  and reviewed with a high frequency filter of $RemoveB'70Hz'nMdfqSIf$  and a low frequency filter of $RemoveB'1Hz'zKonnliN$ . EEG data were recorded continuously and digitally stored. Description: The posterior dominant rhythm consists of 7.5 Hz activity of moderate voltage (25-35 uV) seen predominantly in posterior head regions, symmetric and reactive to eye opening and eye closing. Sleep was characterized by vertex waves, sleep spindles (12 to 14 Hz), maximal frontocentral region. EEG showed continuous generalized and maximal left  temporoparietal 5-6 Hz theta slowing.  Intermittent left temporoparietal 2 to 3 Hz sharply contoured and rhythmic delta slowing was also noted.  Hyperventilation  and photic stimulation were not performed.   ABNORMALITY - Continuous slow, generalized and maximal left temporoparietal region -Intermittent rhythmic slow, left temporoparietal region IMPRESSION: This study showed rhythmic delta activity in left temporoparietal region which is on the ictal-interictal continuum with low potential for seizures. There is also cortical dysfunction in left temporoparietal region likely secondary to underlying structural abnormality/ mass. Additionally there is mild diffuse encephalopathy, nonspecific etiology.  No seizures or definite epileptiform discharges were seen throughout the recording. Lora Havens      IMPRESSION/PLAN: This is a very pleasant 71 y.o.  with metastatic disease to the brain.  I had a lengthy discussion with the patient after reviewing their MRI results with them.  The report to 3 Tesla MRI is not available right now.  He has an obvious metastatic lesion with peritumoral edema in the left parietal lobe -neurosurgery does not recommend  resection.  Therefore, I recommend radiation therapy as a cornerstone of his treatment for his metastatic disease to his brain. We spoke about whole brain radiotherapy versus stereotactic radiosurgery to the brain. We spoke about the differing risks benefits and side effects of both of these treatments. During part of our discussion, we spoke about the hair loss, fatigue and cognitive effects that can result from whole brain radiotherapy.  Additionally, we spoke about radionecrosis that can result from stereotactic radiosurgery. I explained that whole brain radiotherapy is more comprehensive and therefore can decrease the chance of recurrences elsewhere in the brain, while stereotactic radiosurgery only treats the areas of gross disease while sparing the rest of the  brain parenchyma.  After lengthy discussion, the patient would like to proceed with stereotactic brain radiosurgery to their metastatic disease.  If any other lesions are detected on his 3 Tesla MRI we will treat those at the same time.  CT simulation will take place on September 2 and treatment on approximately a week thereafter.   Consent form was signed today acknowledging the risks of stereotactic radiosurgery including but not necessarily limited to fatigue, headache, radionecrosis, brain injury.  The patient and his wife are enthusiastic to proceed with treatment as planned  Addendum: A punctate left occipital metastasis was noted on the 3 Tesla MRI.  I notified the patient and we will treat this as well when we perform his radiosurgery.  On date of service, in total, I spent 60 minutes on this encounter. Patient was seen in person.   __________________________________________   Eppie Gibson, MD  This document serves as a record of services personally performed by Eppie Gibson, MD. It was created on her behalf by Roney Mans, a trained medical scribe. The creation of this record is based on the scribe's personal observations and the provider's statements to them. This document has been checked and approved by the attending provider.

## 2021-01-30 NOTE — Progress Notes (Signed)
Histology and Location of Primary Cancer:  Primary cholangiocarcinoma/Primary esophageal  Location(s) of Symptomatic tumor(s):  MRI Brain w/ & w/o Contrast 01/22/2021 --IMPRESSION: 1.9 cm mass within the left frontoparietal white matter with extensive surrounding edema. Favor metastatic disease over primary neoplasm  Patient presented with symptoms of:  01/22/2021: Patient presented to ED with right-sided upper and lower extremity weakness and numbness that is progressively worsened since last week.  Patient was concerned that he had a stroke after previously believing his symptoms were due to diabetic neuropathy  Biopsies revealed  01/26/2021 FINAL MICROSCOPIC DIAGNOSIS:  A. ESOPHAGUS, DISTAL, BIOPSY:  - Adenocarcinoma, see comment.  COMMENT:  The morphology is most consistent with primary esophageal   01/24/2021 FINAL MICROSCOPIC DIAGNOSIS:  A. 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 ADDENDUM:  Immunohistochemical stains show that the tumor cells are positive for CK7 with patchy staining for CDX2 and focal labeling for CK20.  This immunoprofile is consistent with a pancreatobiliary and upper gastrointestinal primary.  In absence of any other known primary, findings can be compatible with a primary cholangiocarcinoma.   Past or anticipated interventions, if any, per neurosurgery:  01/25/2021 (progress note while in hospital) Dr. Emelda Brothers --Chart reviewed, he has multiple hepatic lesions and potentially esophageal vs pancreatic primary --Either way, if the liver path comes back negative / non-dx then these are easily accessible regions compared to getting path from his brain met  Past/Anticipated chemotherapy by medical oncology, if any:  Scheduled for consult with Cassie Heilingoepter, PA-C/Dr. Truitt Merle on  02/02/2021  Patient's main complaints related to symptomatic tumor(s) are: Mild headache for about half hour and then go away  Pain on a scale of 0-10 is: 4  Dose of Decadron, if applicable: 2 mg PO every 12 hours  Recent neurologic symptoms, if any:  Seizures: none Headaches:mild Nausea: no Dizziness/ataxia: occasional dizziness Difficulty with hand coordination: on the right side some tingling and lack of coordination but better with steriods Focal numbness/weakness: right side Visual deficits/changes: none Confusion/Memory deficits: occasional loss of words  Ambulatory status? Walker? Wheelchair?: walker  SAFETY ISSUES: Prior radiation? none Pacemaker/ICD? no Possible current pregnancy? N/A Is the patient on methotrexate? no  Additional Complaints / other details:  Had 3T MRI of the brain this afternoon discussed with patient what he can expect. No complaints

## 2021-01-30 NOTE — Patient Instructions (Signed)
Very good to meet you and your wife today. Please continue to send updates through MyChart for myself and Dr. Jonni Sanger. Monitor your BP at home. Continue current medication regimen. If trending 140s/90s or higher, need to consider different regimen. Keep specialist f/up appointments. Continue use of walker and good job with fall precautions. Call if you have any questions.

## 2021-01-30 NOTE — Progress Notes (Signed)
Established Patient Office Visit  Subjective:  Patient ID: Jason Snyder, male    DOB: [birth year]  Age: 70 y.o. MRN: [id number]  CC:  Chief Complaint  Patient presents with   Follow-up    HPI Jason Snyder presents for hospital f/up. Here with his wife.  Date of admission was 01/22/2021, date of discharge was 01/26/2021. He was admitted for right-sided weakness.   He was ultimately discharged with diagnoses of brain mass, neoplasm of brain causing mass-effect, cholangiocarcinoma, esophageal dysphagia, esophagitis, and esophageal mass.  He has several follow-up appointments scheduled with specialists.  See A/P.  States he is feeling alright, but his back is very sore, starting last night. Lower right back. Recently had cortisone shots, which do help. NKI. Still having some right sided weakness and numbness.  No headaches or dizziness. No vision changes. No falls. No chest pain or SOB.  Using a walker and son also put up new handrail going upstairs.   BP has been running 130s-140s/80s at home.   Glucose has been running in the 130s-140s on average, not over 150 at home.   Dr. Modena Nunnery, GI, prescribed Protonix for acid reflux since EGD procedure. This has been helping he says.    Past Medical History:  Diagnosis Date   BMI 30.0-30.9,adult    BPH without obstruction/lower urinary tract symptoms 10/03/2017   CAD (coronary artery disease)    cath 2020, single vessel disease, medical mgt   Chronic left shoulder pain c   Chronic low back pain with right-sided sciatica    Chronic pain 10/07/2013   Combined hyperlipidemia associated with type 2 diabetes mellitus ([place]) 07/09/2019   Controlled type 2 diabetes mellitus without complication, without long-term current use of insulin ([place]) 07/12/2014   COPD (chronic obstructive pulmonary disease) ([place]) 01/07/2014   PFTs 07/2018 minimal airway obstruction, overinflation and no response to bronchodilators.    Coronary artery disease involving  native coronary artery of native heart without angina pectoris    Cardiac Cath 01/2019: Severe single-vessel disease with additional moderate two-vessel disease: 100% CTO of RCA, moderate disease in proximal LCx, distal LCx and very small caliber 1st Diag DFR-FFR negative lesion in proximal LCx Normal LV pressures with previously document, ed normal EF.   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   Hypogonadism - pineal glad abnormalities On testosterone replacement   Obesity (BMI 30-39.9)    Obesity (BMI 30.0-34.9) 10/03/2017   OSA on CPAP 10/07/2013   Primary osteoarthritis involving multiple joints 01/10/2015   Rosacea    Smoking greater than 30 pack years - quit 03/2018 10/03/2017   Thoracic and lumbosacral neuritis    Type 2 diabetes mellitus with peripheral neuropathy ([place]) 07/12/2014    Past Surgical History:  Procedure Laterality Date   ARTHROSCOPIC REPAIR ACL     BIOPSY  01/26/2021   Procedure: BIOPSY;  Surgeon: Thornton Park, MD;  Location: [facility];  Service: Gastroenterology;;   CARDIAC CATHETERIZATION     ESOPHAGOGASTRODUODENOSCOPY (EGD) WITH PROPOFOL N/A 01/26/2021   Procedure: ESOPHAGOGASTRODUODENOSCOPY (EGD) WITH PROPOFOL;  Surgeon: Tarri Glenn,  Joelene Millin, MD;  Location: [facility];  Service: Gastroenterology;  Laterality: N/A;   INTRAVASCULAR PRESSURE WIRE/FFR STUDY N/A 01/01/2019   Procedure: INTRAVASCULAR PRESSURE WIRE/FFR STUDY;  Surgeon: Leonie Man, MD;  Location: [facility];  Service: Cardiovascular;  Laterality: N/A;    LEFT HEART CATH AND CORONARY ANGIOGRAPHY N/A 01/01/2019   Procedure: LEFT HEART CATH AND CORONARY ANGIOGRAPHY;  Surgeon: Leonie Man, MD;  Location: [facility];  Service: Cardiovascular;  Laterality: N/A;   PROSTATE CRYOABLATION     03/2019   SHOULDER ARTHROSCOPY     SPINE SURGERY      Family History  Problem Relation Age of Onset   Pulmonary embolism Mother    Heart disease Father    Lung cancer Father    Lung cancer Sister    Heart disease Maternal Uncle     Social History   Socioeconomic History   Marital status: Married    Spouse name: Not on file   Number of children: Not on file   Years of education: Not on file   Highest education level: Not on file  Occupational History   Occupation: retired  Tobacco Use   Smoking status: Former    Packs/day: 0.50    Years: 30.00    Pack years: 15.00    Types: Cigarettes    Quit date: 03/05/2018    Years since quitting: 2.9   Smokeless tobacco: Never   Tobacco comments:    no cigarettes since 03/05/18  Vaping Use   Vaping Use: Never used  Substance and Sexual Activity   Alcohol use: Not Currently   Drug use: Not Currently   Sexual activity: Not Currently  Other Topics Concern   Not on file  Social History Narrative   Not on file   Social Determinants of Health   Financial Resource Strain: Low Risk    Difficulty of Paying Living Expenses: Not hard at all  Food Insecurity: No Food Insecurity   Worried About Charity fundraiser in the Last Year: Never true   [place] in the Last Year: Never true  Transportation Needs: No Transportation Needs   Lack of Transportation (Medical): No   Lack of Transportation (Non-Medical): No  Physical Activity: Insufficiently Active   Days of Exercise per Week: 7 days   Minutes of Exercise per Session: 20 min  Stress: No Stress Concern Present   Feeling of Stress : Not at all  Social Connections: Moderately Isolated   Frequency of Communication with Friends and  Family: More than three times a week   Frequency of Social Gatherings with Friends and Family: Once a week   Attends Religious Services: Never   Marine scientist or Organizations: No   Attends Music therapist: Never   Marital Status: Married  Human resources officer Violence: Not At Risk   Fear of Current or Ex-Partner: No   Emotionally Abused: No   Physically Abused: No   Sexually Abused: No    Outpatient Medications Prior to Visit  Medication Sig Dispense Refill   Accu-Chek Softclix Lancets lancets Use as directed up to 4 times daily 100 each 5   acetaminophen-codeine (TYLENOL #3) 300-30 MG tablet Take 1 tablet by mouth 2 (two) times daily as needed for moderate pain. 90 tablet 0   acetic acid 2 % otic solution Place 4 drops into both ears 2 (two) times daily as needed. (Patient taking differently: Place 4 drops into both ears 2 (two)  times daily as needed (pain).) 15 mL 2   albuterol (VENTOLIN HFA) 108 (90 Base) MCG/ACT inhaler Inhale 2 puffs by mouth into the lungs every 4 hours as needed for wheezing 18 g 0   amLODipine (NORVASC) 10 MG tablet Take 1 tablet (10 mg total) by mouth daily. 90 tablet 0   aspirin 81 MG EC tablet Take 81 mg by mouth daily.      atorvastatin (LIPITOR) 20 MG tablet Take 1 tablet (20 mg total) by mouth at bedtime. 90 tablet 3   Blood Glucose Monitoring Suppl (BLOOD GLUCOSE MONITOR SYSTEM) w/Device KIT Please check blood glucose three times daily (before meals in the morning and 2 hours after largest meals of the day OR lunch and dinner) 1 kit 0   cyclobenzaprine (FLEXERIL) 10 MG tablet Take 1 tablet (10 mg total) by mouth 3 (three) times daily as needed for muscle spasms. 90 tablet 3   dexamethasone (DECADRON) 2 MG tablet Take 1 tablet (2 mg total) by mouth every 12 (twelve) hours. 30 tablet 1   diclofenac sodium (VOLTAREN) 1 % GEL Apply 2 g topically 4 (four) times daily as needed (pain).     FARXIGA 10 MG TABS tablet TAKE ONE TABLET BY MOUTH ONE  TIME DAILY (Patient taking differently: Take 10 mg by mouth daily.) 90 tablet 3   fluticasone (FLONASE) 50 MCG/ACT nasal spray Place 1 spray into the nose daily as needed for allergies.      gabapentin (NEURONTIN) 300 MG capsule Take 1 capsule (300 mg total) by mouth 3 (three) times daily. 180 capsule 3   glucose blood (ACCU-CHEK GUIDE) test strip Use as instructed up to 4 times daily 100 each 12   Ketotifen Fumarate (ALLERGY EYE DROPS OP) Place 1 drop into both eyes daily as needed (allergy).     Loratadine 10 MG CAPS Take 10 mg by mouth daily as needed (allergies).      metFORMIN (GLUCOPHAGE) 500 MG tablet Take 1 tablet (500 mg total) by mouth 2 (two) times daily with a meal. 180 tablet 0   metoprolol tartrate (LOPRESSOR) 50 MG tablet Take 1 tablet (50 mg total) by mouth 2 (two) times daily. 180 tablet 3   Multiple Vitamin (MULTIVITAMIN) capsule Take 1 capsule by mouth daily.      nitroGLYCERIN (NITROSTAT) 0.4 MG SL tablet Place 1 tablet (0.4 mg total) under the tongue every 5 (five) minutes as needed for chest pain. 25 tablet 0   Spacer/Aero-Holding Chambers (AEROCHAMBER MV) inhaler Use as instructed 1 each 0   tamsulosin (FLOMAX) 0.4 MG CAPS capsule TAKE TWO CAPSULES BY MOUTH DAILY (Patient taking differently: Take 0.4 mg by mouth daily.) 180 capsule 0   Tiotropium Bromide-Olodaterol (STIOLTO RESPIMAT) 2.5-2.5 MCG/ACT AERS Inhale 2 puffs into the lungs daily. 4 g 11   pantoprazole (PROTONIX) 40 MG tablet Take 1 tablet (40 mg total) by mouth daily. 30 tablet 0   No facility-administered medications prior to visit.    Allergies  Allergen Reactions   Rubus Fruticosus Rash    Blackberries    ROS Review of Systems REFER TO HPI FOR PERTINENT POSITIVES AND NEGATIVES    Objective:    Physical Exam Vitals and nursing note reviewed.  Constitutional:      Appearance: Normal appearance.  HENT:     Head: Normocephalic and atraumatic.     Right Ear: External ear normal.     Left Ear:  External ear normal.     Nose: Nose normal.  Cardiovascular:  Rate and Rhythm: Normal rate and regular rhythm.     Pulses: Normal pulses.     Heart sounds: Normal heart sounds. No murmur heard. Pulmonary:     Effort: Pulmonary effort is normal.     Breath sounds: Normal breath sounds.  Skin:    General: Skin is warm and dry.  Neurological:     Mental Status: He is alert and oriented to person, place, and time.     Sensory: Sensory deficit (RUE) present.     Motor: Weakness (RUE) present.  Psychiatric:        Mood and Affect: Mood normal.        Behavior: Behavior normal.    BP (!) 147/81   Pulse 75   Temp 98.2 F (36.8 C)   Ht $R'6\' 2"'ba$  (1.88 m)   Wt 239 lb (108.4 kg)   SpO2 98%   BMI 30.69 kg/m  Wt Readings from Last 3 Encounters:  02/02/21 238 lb 8 oz (108.2 kg)  01/31/21 238 lb (108 kg)  01/30/21 239 lb (108.4 kg)     Health Maintenance Due  Topic Date Due   Zoster Vaccines- Shingrix (1 of 2) Never done   URINE MICROALBUMIN  04/07/2020   COVID-19 Vaccine (4 - Booster for Pfizer series) 06/18/2020    There are no preventive care reminders to display for this patient.  Lab Results  Component Value Date   TSH 1.68 04/11/2020   Lab Results  Component Value Date   WBC 9.5 02/02/2021   HGB 16.6 02/02/2021   HCT 48.5 02/02/2021   MCV 92.9 02/02/2021   PLT 276 02/02/2021   Lab Results  Component Value Date   NA 140 02/02/2021   K 4.2 02/02/2021   CO2 24 02/02/2021   GLUCOSE 143 (H) 02/02/2021   BUN 19 02/02/2021   CREATININE 0.89 02/02/2021   BILITOT 0.6 02/02/2021   ALKPHOS 96 02/02/2021   AST 23 02/02/2021   ALT 147 (H) 02/02/2021   PROT 6.8 02/02/2021   ALBUMIN 3.9 02/02/2021   CALCIUM 9.2 02/02/2021   ANIONGAP 12 02/02/2021   GFR 79.32 04/08/2019   Lab Results  Component Value Date   CHOL 114 04/11/2020   Lab Results  Component Value Date   HDL 36 (L) 04/11/2020   Lab Results  Component Value Date   LDLCALC 59 04/11/2020   Lab  Results  Component Value Date   TRIG 103 04/11/2020   Lab Results  Component Value Date   CHOLHDL 3.2 04/11/2020   Lab Results  Component Value Date   HGBA1C 6.9 (A) 01/11/2021      Assessment & Plan:   Problem List Items Addressed This Visit       Cardiovascular and Mediastinum   Hypertension     Digestive   RESOLVED: Cholangiocarcinoma ([place])     Endocrine   Diabetes mellitus without complication ([place])     Nervous and Auditory   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 structures ([place]) - Primary     Other   Brain mass   TCM follow up visit -New diagnosis of metastatic disease, 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  -Medications reconciled at today's visit -Numerous f/ups scheduled with: Dr. Earnestine Leys - Neurologist Dr. Zada Finders - Neurosurgeon Dr. Tarri Glenn [facility] Gastroenterology Dr. Jonni Sanger - PCP Dr. Annamaria Boots - Oncologist -Fall precautions as home discussed -He will continue to monitor his BP and continue Norvasc 10 mg, Lopressor 50 mg -He will continue current diabetes regimen  Follow-up: No  follow-ups on file.    Meriah Shands M Yaneth Fairbairn, PA-C

## 2021-01-31 ENCOUNTER — Telehealth: Payer: Self-pay | Admitting: Gastroenterology

## 2021-01-31 ENCOUNTER — Ambulatory Visit
Admission: RE | Admit: 2021-01-31 | Discharge: 2021-01-31 | Disposition: A | Discharge status: Home / Self Care | Payer: Medicare Other | Source: Ambulatory Visit | Attending: Radiation Oncology | Admitting: Radiation Oncology

## 2021-01-31 ENCOUNTER — Encounter: Payer: Self-pay | Admitting: Radiation Oncology

## 2021-01-31 VITALS — BP 153/87 | HR 66 | Temp 97.6°F | Resp 18 | Ht 74.0 in | Wt 238.0 lb

## 2021-01-31 DIAGNOSIS — C7931 Secondary malignant neoplasm of brain: Secondary | ICD-10-CM

## 2021-01-31 DIAGNOSIS — G936 Cerebral edema: Secondary | ICD-10-CM | POA: Diagnosis not present

## 2021-01-31 DIAGNOSIS — R220 Localized swelling, mass and lump, head: Secondary | ICD-10-CM | POA: Diagnosis not present

## 2021-01-31 DIAGNOSIS — C155 Malignant neoplasm of lower third of esophagus: Secondary | ICD-10-CM | POA: Diagnosis not present

## 2021-01-31 MED ORDER — GADOBENATE DIMEGLUMINE 529 MG/ML IV SOLN
20.0000 mL | Freq: Once | INTRAVENOUS | Status: AC | PRN
  Administered 2021-01-31: 20 mL via INTRAVENOUS

## 2021-01-31 NOTE — Telephone Encounter (Signed)
Documented as result note.

## 2021-01-31 NOTE — Progress Notes (Signed)
[place] [phone]   [phone]  CONSULT NOTE  REASON FOR CONSULTATION:  Metastatic Esophageal adenocarcinoma  HPI Jason Snyder is a 70 y.o. male with a past medical history significant for coronary artery disease, diabetes, obesity, COPD (controlled), hyperlipidemia, long standing history of GERD, hypertension, BPH (followed by Dr. Gloriann Loan), obstructive sleep apnea, and numerous orthopedic surgeries (lumbar, knees, shoulders, etc) is referred to the clinic for evaluation of newly diagnosed metastatic esophageal cancer.   In all, the patient believes that his symptoms started approximately 1 month ago.  He states it started with worsening neuropathy symptoms in his right foot and ankle. He is taking gabapentin. He was scheduled to have a lower extremity Doppler ultrasound performed in [place].  He had several occasions where he was driving where he could not tell if his foot was on the gas pedal or the brake.   He had worsening and progressive symptoms of right hand and right leg weakness.  He was having difficulties writing.  He was concerned that he had a stroke. He then presented to the [facility] Emergency Room on 01/22/2021 for the chief complaint of right arm and right leg weakness.  He also had associated tingling on the right side of his body that waxed and waned over the course of 2 weeks with some associated difficulties walking. Additionally, he reported leaning to the right side and dropping objects frequently.  His exam showed weakness in the right arm and right leg. He had a CT scan of the head which demonstrated a left parietal mass with extensive vasogenic edema concerning for metastasis.  He subsequently had a brain MRI which showed a 1.9 cm mass in the left frontoparietal white matter with extensive surrounding edema.  This was favored to represent metastatic disease.   He was admitted to the hospital for further workup from 8/22-8/26/22. His  work up included a CT scan of the chest, abdomen, and pelvis on 01/22/2021 which showed an indeterminate hypodensity within the right lobe of the liver abutting the gallbladder fossa which was concerning for neoplasm.  There is also an enlarged prostate with asymmetric decreased attenuation of the right peripheral zone of the prostate.  Of note, the patient states he saw his urologist 1 month ago, Dr. Gloriann Loan. He reportedly had a PSA drawn at that time which was normal. He had a PSA performed in the hospital that was WNL at 1.28. An MRI of the liver was performed on 01/23/2021 to further characterize the liver which showed multiple hepatic lesions suspicious for metastatic disease, potentially from a esophageal neoplasm.  They also recommended a 59-monthfollow-up for a cystic abnormality at the head of the pancreas.    He had an ultrasound biopsy of the liver on 01/24/2021. The pathology at that time [id number] was consistent with adenocarcinoma. Initially, pathology felt that the tumor histomorphology was compatible with primary cholangiocarcinoma in the absence of any other known malignancies.  However, the patient had a biopsy of the esophagus via upper endoscopy on 01/26/2021 under the care of Dr. BTarri Glenn She visualized medium-sized, ulcerating, non-obstructing, not circumferential mass in distal esophagus, likely malignant esophageal tumor 40 cm from the incisors.  There was also surrounding esophagitis. The final pathology (CASE: [id number] was consistent with adenocarcinoma and the morphology was most consistent with primary esophageal carcinoma. MMR, HER-2, and PDL1 were requested on 02/02/21.   Since being discharged in the hospital, he met with Dr. SIsidore Moosfrom radiation oncology. He underwent his  [place] simulation today. Another punctate left occipital metastasis was seen on the 3 Tesla MRI. Dr. Isidore Moos will also treat this as well when she performs his radiosurgery on 02/09/21.  Overall, the patient's  neurologic symptoms of right upper and lower extremity weakness are improving.  He is currently taking Decadron 2 mg twice daily.  He is supposed to undergo physical therapy and occupational Therapy.  They have contacted him, but with all his appointments, the patient wanted to hold off on scheduling any therapy until he has a clear understanding of his upcoming appointments with medical oncology.  Overall, the patient feels fair today.  In retrospect, he feels that over the last 6 to 8 months he had some evidence of dysphagia with solids and liquids.  He describes this as a sensation of food getting "stuck".  Overall, the symptoms have been fairly mild/manageable.  He states that the food does "go down".  He is a longstanding history of heartburn.  He states that he can recall having heartburn about 38 years ago on and off.  He notes that in the last 10 years or so he was started on a PPI with omeprazole.  He has not had any associated weight loss recently.  He denies any nausea, vomiting, hematemesis, melena, or hematochezia.  He denies any diarrhea or constipation.  He denies any epigastric pain except he had some "stomach issues" following his upper endoscopy and a few days after.  He localizes the discomfort to the epigastric area which has resolved at this time with increasing the dose of his Protonix.  Dr. Tarri Glenn recommended that he take 40 mg twice daily.  He reports some baseline dyspnea on exertion which he states is humidity related and notes that his COPD is controlled.  He has never required a hospitalization for COPD exacerbation and he is on inhalers.  He denies any known hoarseness except for an occasional dry mouth which he attributes to being medication related.  He has some chronic pain secondary to his multiple orthopedic issues.  He notes that he has arthritis in 2 vertebrae as well as the right hip.  He sees his orthopedic physician every 6 months for a steroid injection.  He had low back  surgery in the past and state that he has mild chronic pain (rated 3 out of 10) in this area but denies any changes with his pain recently.  He denies any other sites of pain.   The patient's mother passed away due to a blood clot in her 69s.  The patient's father passed away when the patient was 11 years old due to cancer. He does not know the primary site. The patient states that his father had several chemical exposures as a Curator and had known exposures to benzene.  The patient's father was also a smoker.  He does not have any siblings and denies any other family history of cancer.  The patient is married to his wife Coralyn Mark who accompanied him to his appointment today.  He has a walker for ambulation. The patient has 3 sons. 1 who lives in [place], [place], the other in [place], and the other in [place].  He is a former smoker having smoked approximately 47 years averaging 1/2-1 ppd. She has not consumed any alcohol in 16 years. He used to drink about a 6 pack of beer daily and a few shots about 3x per week. He denies street drug use.    HPI  Past Medical History:  Diagnosis Date   BMI 30.0-30.9,adult    BPH without obstruction/lower urinary tract symptoms 10/03/2017   CAD (coronary artery disease)    cath 2020, single vessel disease, medical mgt   Chronic left shoulder pain c   Chronic low back pain with right-sided sciatica    Chronic pain 10/07/2013   Combined hyperlipidemia associated with type 2 diabetes mellitus ([place]) 07/09/2019   Controlled type 2 diabetes mellitus without complication, without long-term current use of insulin ([place]) 07/12/2014   COPD (chronic obstructive pulmonary disease) ([place]) 01/07/2014   PFTs 07/2018 minimal airway obstruction, overinflation and no response to bronchodilators.    Coronary artery disease involving native coronary artery of native heart without angina pectoris    Cardiac Cath 01/2019: Severe single-vessel disease with additional  moderate two-vessel disease: 100% CTO of RCA, moderate disease in proximal LCx, distal LCx and very small caliber 1st Diag DFR-FFR negative lesion in proximal LCx Normal LV pressures with previously document, ed normal EF.   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   Hypogonadism - pineal glad abnormalities On testosterone replacement   Obesity (BMI 30-39.9)    Obesity (BMI 30.0-34.9) 10/03/2017   OSA on CPAP 10/07/2013   Primary osteoarthritis involving multiple joints 01/10/2015   Rosacea    Smoking greater than 30 pack years - quit 03/2018 10/03/2017   Thoracic and lumbosacral neuritis    Type 2 diabetes mellitus with peripheral neuropathy ([place]) 07/12/2014    Past Surgical History:  Procedure Laterality Date   ARTHROSCOPIC REPAIR ACL     BIOPSY  01/26/2021   Procedure: BIOPSY;  Surgeon: Thornton Park, MD;  Location: [facility] ENDOSCOPY;  Service: Gastroenterology;;   CARDIAC CATHETERIZATION     ESOPHAGOGASTRODUODENOSCOPY (EGD) WITH PROPOFOL N/A 01/26/2021   Procedure: ESOPHAGOGASTRODUODENOSCOPY (EGD) WITH PROPOFOL;  Surgeon: Thornton Park, MD;  Location: [place];  Service: Gastroenterology;  Laterality: N/A;   INTRAVASCULAR PRESSURE WIRE/FFR STUDY N/A 01/01/2019   Procedure: INTRAVASCULAR PRESSURE WIRE/FFR STUDY;  Surgeon: Leonie Man, MD;  Location: [place] CV LAB;  Service: Cardiovascular;  Laterality: N/A;   LEFT HEART CATH AND CORONARY ANGIOGRAPHY N/A 01/01/2019   Procedure: LEFT HEART CATH AND CORONARY ANGIOGRAPHY;  Surgeon: Leonie Man, MD;  Location: [place] CV LAB;  Service: Cardiovascular;  Laterality: N/A;   PROSTATE CRYOABLATION     03/2019   SHOULDER ARTHROSCOPY     SPINE SURGERY      Family History  Problem Relation Age of Onset   Pulmonary embolism Mother    Heart disease Father    Lung cancer Father    Lung cancer Sister    Heart disease Maternal Uncle     Social History Social History   Tobacco Use   Smoking status: Former    Packs/day: 0.50    Years: 30.00    Pack years: 15.00    Types: Cigarettes    Quit date: 03/05/2018    Years since quitting: 2.9   Smokeless tobacco: Never   Tobacco comments:    no cigarettes since 03/05/18  Vaping Use  Vaping Use: Never used  Substance Use Topics   Alcohol use: Not Currently   Drug use: Not Currently    Allergies  Allergen Reactions   Rubus Fruticosus Rash    Blackberries    Current Outpatient Medications  Medication Sig Dispense Refill   Accu-Chek Softclix Lancets lancets Use as directed up to 4 times daily 100 each 5   acetaminophen-codeine (TYLENOL #3) 300-30 MG tablet Take 1 tablet by mouth 2 (two) times daily as needed for moderate pain. 90 tablet 0   acetic acid 2 % otic solution Place 4 drops into both ears 2 (two) times daily as needed. (Patient taking differently: Place 4 drops into both ears 2 (two) times daily as needed (pain).) 15 mL 2   albuterol (VENTOLIN HFA) 108 (90 Base) MCG/ACT inhaler Inhale 2 puffs by mouth into the lungs every 4 hours as needed for wheezing 18 g 0   amLODipine (NORVASC) 10 MG tablet Take 1 tablet (10 mg total) by mouth daily. 90 tablet 0   aspirin 81 MG EC tablet Take 81 mg by mouth daily.      atorvastatin (LIPITOR) 20 MG tablet Take 1 tablet (20 mg total) by mouth at bedtime. 90 tablet 3   Blood Glucose Monitoring Suppl (BLOOD GLUCOSE MONITOR SYSTEM) w/Device KIT Please check blood glucose three times daily (before meals in the morning and 2 hours after largest meals of the day OR lunch and  dinner) 1 kit 0   cyclobenzaprine (FLEXERIL) 10 MG tablet Take 1 tablet (10 mg total) by mouth 3 (three) times daily as needed for muscle spasms. 90 tablet 3   dexamethasone (DECADRON) 2 MG tablet Take 1 tablet (2 mg total) by mouth every 12 (twelve) hours. 30 tablet 1   diclofenac sodium (VOLTAREN) 1 % GEL Apply 2 g topically 4 (four) times daily as needed (pain).     FARXIGA 10 MG TABS tablet TAKE ONE TABLET BY MOUTH ONE TIME DAILY (Patient taking differently: Take 10 mg by mouth daily.) 90 tablet 3   fluticasone (FLONASE) 50 MCG/ACT nasal spray Place 1 spray into the nose daily as needed for allergies.      gabapentin (NEURONTIN) 300 MG capsule Take 1 capsule (300 mg total) by mouth 3 (three) times daily. 180 capsule 3   glucose blood (ACCU-CHEK GUIDE) test strip Use as instructed up to 4 times daily 100 each 12   Ketotifen Fumarate (ALLERGY EYE DROPS OP) Place 1 drop into both eyes daily as needed (allergy).     lidocaine-prilocaine (EMLA) cream Apply 1 application topically as needed. 30 g 2   Loratadine 10 MG CAPS Take 10 mg by mouth daily as needed (allergies).      metFORMIN (GLUCOPHAGE) 500 MG tablet Take 1 tablet (500 mg total) by mouth 2 (two) times daily with a meal. 180 tablet 0   metoprolol tartrate (LOPRESSOR) 50 MG tablet Take 1 tablet (50 mg total) by mouth 2 (two) times daily. 180 tablet 3   Multiple Vitamin (MULTIVITAMIN) capsule Take 1 capsule by mouth daily.      nitroGLYCERIN (NITROSTAT) 0.4 MG SL tablet Place 1 tablet (0.4 mg total) under the tongue every 5 (five) minutes as needed for chest pain. 25 tablet 0   ondansetron (ZOFRAN) 8 MG tablet Take 1 tablet (8 mg total) by mouth every 8 (eight) hours as needed for nausea or vomiting. 30 tablet 2   pantoprazole (PROTONIX) 40 MG tablet Take 1 tablet (40 mg total) by mouth 2 (  two) times daily before a meal. 60 tablet 3   prochlorperazine (COMPAZINE) 10 MG tablet Take 1 tablet (10 mg total) by mouth every 6 (six) hours as needed.  30 tablet 2   Spacer/Aero-Holding Chambers (AEROCHAMBER MV) inhaler Use as instructed 1 each 0   tamsulosin (FLOMAX) 0.4 MG CAPS capsule TAKE TWO CAPSULES BY MOUTH DAILY (Patient taking differently: Take 0.4 mg by mouth daily.) 180 capsule 0   Tiotropium Bromide-Olodaterol (STIOLTO RESPIMAT) 2.5-2.5 MCG/ACT AERS Inhale 2 puffs into the lungs daily. 4 g 11   No current facility-administered medications for this visit.    REVIEW OF SYSTEMS:   Review of Systems  Constitutional: Negative for appetite change, chills, fever and unexpected weight change.  HENT: Positive for mild dysphagia. Negative for mouth sores, nosebleeds, sore throat and odynophagia Eyes: Negative for eye problems and icterus.  Respiratory: Positive for baseline shortness of breath with humidity and wheezing. Negative for cough and hemoptysis. Cardiovascular: Negative for chest pain and leg swelling.  Gastrointestinal: Positive for few days worth of epigastric pain following procedure (resolved). Negative for constipation, diarrhea, hematemesis, melena, or hematochezia nausea and vomiting.  Genitourinary: Negative for bladder incontinence, difficulty urinating, dysuria, frequency and hematuria.   Musculoskeletal: Positive for chronic low back pain, right hip pain, and knee pain.  Negative for gait problem, neck pain and neck stiffness.  Skin: Negative for itching and rash.  Neurological: Positive for right upper and lower extremity weakness  (improving). Negative for dizziness, gait problem, headaches, light-headedness and seizures.  Hematological: Negative for adenopathy. Does not bruise/bleed easily.  Psychiatric/Behavioral: Negative for confusion, depression and sleep disturbance. The patient is not nervous/anxious.     PHYSICAL EXAMINATION:  Blood pressure (!) 149/81, pulse 66, temperature (!) 97.4 F (36.3 C), temperature source Tympanic, resp. rate 18, weight 238 lb 8 oz (108.2 kg), SpO2 97 %.  ECOG PERFORMANCE  STATUS: 1  Physical Exam  Constitutional: Oriented to person, place, and time and well-developed, well-nourished, and in no distress.  HENT:  Head: Normocephalic and atraumatic.  Mouth/Throat: Oropharynx is clear and moist. No oropharyngeal exudate.  Eyes: Conjunctivae are normal. Right eye exhibits no discharge. Left eye exhibits no discharge. No scleral icterus.  Neck: Normal range of motion. Neck supple.  Cardiovascular: Normal rate, regular rhythm, normal heart sounds and intact distal pulses.   Pulmonary/Chest: Effort normal and breath sounds normal. No respiratory distress. No wheezes. No rales.  Abdominal: Soft. Bowel sounds are normal. Exhibits no distension and no mass. There is no tenderness.  Musculoskeletal: Normal range of motion. Exhibits no edema.  Lymphadenopathy:    No cervical adenopathy.  Neurological: Alert and oriented to person, place, and time. Exhibits normal muscle tone. Gait normal. Coordination normal.  Decreased sensation in the right hand compared to left.  Cranial nerves II through XII grossly normal.  Speech fluent.  Mild right-sided weakness in the upper extremity. Skin: Skin is warm and dry. No rash noted. Not diaphoretic. No erythema. No pallor.  Psychiatric: Mood, memory and judgment normal.  Vitals reviewed.  LABORATORY DATA: Lab Results  Component Value Date   WBC 9.5 02/02/2021   HGB 16.6 02/02/2021   HCT 48.5 02/02/2021   MCV 92.9 02/02/2021   PLT 276 02/02/2021      Chemistry      Component Value Date/Time   NA 140 02/02/2021 1238   NA 135 12/24/2018 0945   K 4.2 02/02/2021 1238   CL 104 02/02/2021 1238   CO2 24 02/02/2021 1238  BUN 19 02/02/2021 1238   BUN 10 12/24/2018 0945   CREATININE 0.89 02/02/2021 1238   CREATININE 1.02 04/11/2020 0824      Component Value Date/Time   CALCIUM 9.2 02/02/2021 1238   ALKPHOS 96 02/02/2021 1238   AST 23 02/02/2021 1238   ALT 147 (H) 02/02/2021 1238   BILITOT 0.6 02/02/2021 1238        RADIOGRAPHIC STUDIES: CT HEAD WO CONTRAST  Result Date: 01/22/2021 CLINICAL DATA:  Neuro deficit, acute, stroke suspected. Right arm and leg numbness. EXAM: CT HEAD WITHOUT CONTRAST TECHNIQUE: Contiguous axial images were obtained from the base of the skull through the vertex without intravenous contrast. COMPARISON:  None. FINDINGS: Brain: There is a 1.3 cm mass in the left parietal lobe with extensive surrounding vasogenic edema. The mass is heterogeneously hyperdense which may reflect hypercellularity or hemorrhage. There is regional sulcal effacement and mass effect on the left lateral ventricle without midline shift. No second lesion is identified on this unenhanced study. No acute cortically based infarct or extra-axial fluid collection is evident. Vascular: Calcified atherosclerosis at the skull base. No hyperdense vessel. Skull: No fracture or suspicious osseous lesion. Sinuses/Orbits: Mild right frontal and ethmoid sinus mucosal thickening. Trace left mastoid effusion. Unremarkable orbits. Other: None. IMPRESSION: Left parietal mass with extensive vasogenic edema most concerning for a metastasis. Brain MRI without and with contrast is recommended for further evaluation. These results were called by telephone at the time of interpretation on 01/22/2021 at 1:56 pm to provider Alyse Low, who verbally acknowledged these results. Electronically Signed   By: Logan Bores M.D.   On: 01/22/2021 13:57   CT Chest W Contrast  Result Date: 01/22/2021 CLINICAL DATA:  Intracranial metastases without known primary malignancy EXAM: CT CHEST, ABDOMEN, AND PELVIS WITH CONTRAST TECHNIQUE: Multidetector CT imaging of the chest, abdomen and pelvis was performed following the standard protocol during bolus administration of intravenous contrast. CONTRAST:  175m OMNIPAQUE IOHEXOL 300 MG/ML  SOLN COMPARISON:  11/03/2020 FINDINGS: CT CHEST FINDINGS Cardiovascular: The heart and great vessels are grossly  unremarkable. There is trace pericardial fluid anteriorly. Moderate atherosclerosis of the aorta, great vessel origins, and coronary vasculature. Mediastinum/Nodes: No enlarged mediastinal, hilar, or axillary lymph nodes. Thyroid gland, trachea, and esophagus demonstrate no significant findings. Small hiatal hernia. Lungs/Pleura: No acute airspace disease, effusion, or pneumothorax. No pulmonary nodules or masses. The central airways are widely patent. Musculoskeletal: No acute or destructive bony lesions. Reconstructed images demonstrate no additional findings. CT ABDOMEN PELVIS FINDINGS Hepatobiliary: 1.3 cm cyst is identified within the superior aspect right lobe liver image 52/3. There is a 1.5 cm indeterminate hypodensity within the right lobe liver adjacent to the gallbladder fossa, reference image 63/3, concerning for metastatic disease given intracranial findings. No other focal liver abnormalities are observed. Gallbladder is unremarkable. 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 Lobular cystic structures seen in the region of the uncinate process of the pancreas, measuring 2.9 x 2.5 cm. This cystic mass demonstrates no internal nodularity or septations, most consistent with sequela of chronic pancreatitis and small pseudocyst. Spleen: Normal in size without focal abnormality. Adrenals/Urinary Tract: The kidneys enhance normally and symmetrically. The bladder is severely distended, which could be due to chronic bladder outlet obstruction. Mild thickening of the body of the left adrenal gland measuring 7 mm without focal abnormality. Right adrenal is normal. Stomach/Bowel: No bowel obstruction or ileus. The appendix, if still present, is not well visualized.  No bowel wall thickening or inflammatory change. Vascular/Lymphatic: There is extensive atherosclerosis of the aorta and its branches. Significant calcified  plaque within the bilateral common femoral arteries causes greater than 90% stenosis. No pathologic adenopathy within the abdomen or pelvis. Reproductive: Prostate is mildly enlarged measuring 5.8 x 4.0 by 4.8 cm. Asymmetric hypoattenuation of the peripheral zone of the right aspect of the prostate. Please correlate with physical exam findings and serum PSA. Other: No free fluid or free gas.  No abdominal wall hernia. Musculoskeletal: No acute bony abnormalities. There is prominent spondylosis at the L3-4 level. Benign-appearing lucency within the right iliac crest with well-circumscribed margins. Reconstructed images demonstrate no additional findings. IMPRESSION: 1. Indeterminate hypodensity within the right lobe liver abutting the gallbladder fossa, concerning for neoplasm given intracranial findings. If tissue diagnosis is desired, image guided biopsy could be considered. Given the decreased conspicuity on delayed imaging, follow-up liver ultrasound with possible ultrasound-guided biopsy could be considered. Alternatively, dedicated liver MRI could be considered. 2. Enlarged prostate, with asymmetric decreased attenuation of the right peripheral zone of the prostate. Please correlate with serum PSA and physical exam findings. 3. Distended urinary bladder, which may reflect chronic bladder outlet obstruction given the enlarged prostate. 4. 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 5. No acute intrathoracic process. 6. Aortic Atherosclerosis (ICD10-I70.0). Severe atherosclerosis of the bilateral common femoral arteries with greater than 90% stenosis. Electronically Signed   By: Randa Ngo M.D.   On: 01/22/2021 17:26   MR Brain W Wo Contrast  Result Date: 02/02/2021 CLINICAL DATA:  70 year old male with left parietal lobe mass and cerebral edema diagnosed by CT last month in the  setting of right extremity numbness. Evidence of liver metastases, status post ultrasound-guided needle biopsy revealing adenocarcinoma. Esophageal carcinoma suspected. Metastatic treatment planning. EXAM: MRI HEAD WITHOUT AND WITH CONTRAST TECHNIQUE: Multiplanar, multiecho pulse sequences of the brain and surrounding structures were obtained without and with intravenous contrast. CONTRAST:  58m MULTIHANCE GADOBENATE DIMEGLUMINE 529 MG/ML IV SOLN COMPARISON:  Brain MRI 01/22/2021. FINDINGS: Brain: Mildly lobulated up to 21 mm diameter enhancing mass in the left parietal lobe has mildly spiculated margins and is stable and size and morphology from last month. Regional vasogenic edema has not significantly changed. Stable mild regional mass effect. A 2nd punctate enhancing lesion in the medial left occipital lobe is more conspicuous on black blood postcontrast imaging today measuring 2-3 mm on series 11, image 64. This resemble vascular enhancement on the recent comparison but is most compatible with a punctate 2nd metastasis. No edema or mass effect. No other No abnormal enhancement identified. No suspicious dural thickening. No superimposed restricted diffusion to suggest acute infarction. No midline shift, ventriculomegaly, extra-axial collection or acute intracranial hemorrhage. Cervicomedullary junction and pituitary are within normal limits. No chronic cerebral blood products or cortical encephalomalacia identified. Scattered small mostly subcortical white matter nonspecific T2 and FLAIR hyperintensity is stable and mild for age. Vascular: Major intracranial vascular flow voids are stable with dominant right vertebral artery. Skull and upper cervical spine: Visualized bone marrow signal is within normal limits. Grossly negative visible cervical spine. Sinuses/Orbits: Negative. Other: Visible internal auditory structures appear normal. Negative visible scalp soft tissues. IMPRESSION: Positive for two brain  metastases: - the dominant 21 mm left parietal lobe metastasis with stable regional edema and mild regional mass effect. - punctate enhancing metastasis in the medial left occipital lobe. Stable left parietal  lobe metastasis with mild regional mass effect and vasogenic edema. Both lesions annotated on series 11. Electronically Signed   By: Genevie Ann M.D.   On: 02/02/2021 06:13   MR Brain W and Wo Contrast  Result Date: 01/22/2021 CLINICAL DATA:  Right-sided weakness, brain mass on CT EXAM: MRI HEAD WITHOUT AND WITH CONTRAST TECHNIQUE: Multiplanar, multiecho pulse sequences of the brain and surrounding structures were obtained without and with intravenous contrast. CONTRAST:  4m GADAVIST GADOBUTROL 1 MMOL/ML IV SOLN COMPARISON:  Correlation made with same day CT head FINDINGS: Brain: There is a heterogeneously enhancing frontoparietal mass within the left centrum semiovale with extensive surrounding edema. Mass measures 1.6 x 1.6 x 1.9 cm. No associated reduced diffusion. Susceptibility is present reflecting intralesional blood products or mineralization. No additional mass or abnormal enhancement. Edema causes mild regional mass effect. Vascular: Major vessel flow voids at the skull base are preserved. Skull and upper cervical spine: Normal marrow signal is preserved. Sinuses/Orbits: Minor mucosal thickening.  Orbits are unremarkable. Other: Sella is partially empty. Minimal patchy mastoid fluid opacification. IMPRESSION: 1.9 cm mass within the left frontoparietal white matter with extensive surrounding edema. Favor metastatic disease over primary neoplasm. Electronically Signed   By: PMacy MisM.D.   On: 01/22/2021 16:39   CT ABDOMEN PELVIS W CONTRAST  Result Date: 01/22/2021 CLINICAL DATA:  Intracranial metastases without known primary malignancy EXAM: CT CHEST, ABDOMEN, AND PELVIS WITH CONTRAST TECHNIQUE: Multidetector CT imaging of the chest, abdomen and pelvis was performed following the standard  protocol during bolus administration of intravenous contrast. CONTRAST:  1045mOMNIPAQUE IOHEXOL 300 MG/ML  SOLN COMPARISON:  11/03/2020 FINDINGS: CT CHEST FINDINGS Cardiovascular: The heart and great vessels are grossly unremarkable. There is trace pericardial fluid anteriorly. Moderate atherosclerosis of the aorta, great vessel origins, and coronary vasculature. Mediastinum/Nodes: No enlarged mediastinal, hilar, or axillary lymph nodes. Thyroid gland, trachea, and esophagus demonstrate no significant findings. Small hiatal hernia. Lungs/Pleura: No acute airspace disease, effusion, or pneumothorax. No pulmonary nodules or masses. The central airways are widely patent. Musculoskeletal: No acute or destructive bony lesions. Reconstructed images demonstrate no additional findings. CT ABDOMEN PELVIS FINDINGS Hepatobiliary: 1.3 cm cyst is identified within the superior aspect right lobe liver image 52/3. There is a 1.5 cm indeterminate hypodensity within the right lobe liver adjacent to the gallbladder fossa, reference image 63/3, concerning for metastatic disease given intracranial findings. No other focal liver abnormalities are observed. Gallbladder is unremarkable. 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 Lobular cystic structures seen in the region of the uncinate process of the pancreas, measuring 2.9 x 2.5 cm. This cystic mass demonstrates no internal nodularity or septations, most consistent with sequela of chronic pancreatitis and small pseudocyst. Spleen: Normal in size without focal abnormality. Adrenals/Urinary Tract: The kidneys enhance normally and symmetrically. The bladder is severely distended, which could be due to chronic bladder outlet obstruction. Mild thickening of the body of the left adrenal gland measuring 7 mm without focal abnormality. Right adrenal is normal. Stomach/Bowel: No bowel obstruction or  ileus. The appendix, if still present, is not well visualized. No bowel wall thickening or inflammatory change. Vascular/Lymphatic: There is extensive atherosclerosis of the aorta and its branches. Significant calcified plaque within the bilateral common femoral arteries causes greater than 90% stenosis. No pathologic adenopathy within the abdomen or pelvis. Reproductive: Prostate is mildly enlarged measuring 5.8 x 4.0 by 4.8 cm. Asymmetric hypoattenuation of the peripheral zone of  the right aspect of the prostate. Please correlate with physical exam findings and serum PSA. Other: No free fluid or free gas.  No abdominal wall hernia. Musculoskeletal: No acute bony abnormalities. There is prominent spondylosis at the L3-4 level. Benign-appearing lucency within the right iliac crest with well-circumscribed margins. Reconstructed images demonstrate no additional findings. IMPRESSION: 1. Indeterminate hypodensity within the right lobe liver abutting the gallbladder fossa, concerning for neoplasm given intracranial findings. If tissue diagnosis is desired, image guided biopsy could be considered. Given the decreased conspicuity on delayed imaging, follow-up liver ultrasound with possible ultrasound-guided biopsy could be considered. Alternatively, dedicated liver MRI could be considered. 2. Enlarged prostate, with asymmetric decreased attenuation of the right peripheral zone of the prostate. Please correlate with serum PSA and physical exam findings. 3. Distended urinary bladder, which may reflect chronic bladder outlet obstruction given the enlarged prostate. 4. 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 5. No acute intrathoracic process. 6. Aortic Atherosclerosis (ICD10-I70.0). Severe atherosclerosis of the bilateral common femoral arteries with greater than 90% stenosis. Electronically  Signed   By: Randa Ngo M.D.   On: 01/22/2021 17:26   MR LIVER W WO CONTRAST  Result Date: 01/24/2021 CLINICAL DATA:  History of liver lesion discovered on a is CT of the abdomen and pelvis. EXAM: MRI ABDOMEN WITHOUT AND WITH CONTRAST TECHNIQUE: Multiplanar multisequence MR imaging of the abdomen was performed both before and after the administration of intravenous contrast. CONTRAST:  64m GADAVIST GADOBUTROL 1 MMOL/ML IV SOLN COMPARISON:  CT of the abdomen and pelvis of August 22, CT of the chest, abdomen and pelvis of January 22, 2021. FINDINGS: Lower chest: No sign of effusion or evidence of consolidative process at the lung bases. Limited assessment on MRI. Hepatobiliary: Signs of hepatic steatosis, mild. Lesion of concern in the area of the gallbladder fossa shows restricted diffusion and peripheral enhancement with washout. (Image 47/1) Multiple other foci of rim enhancement and restricted diffusion. (Image 38/71) 7 mm lesion in the lateral segment of the LEFT hepatic lobe. (Image 35/701 9 mm lesion in the RIGHT hepatic lobe, hepatic subsegment V/VIII. Small satellite lesions about the dominant lesion in hepatic subsegment IV/V. At least 8 additional lesions in the RIGHT hepatic lobe. Pancreas: Marked pancreatic atrophy. Cystic lesion in the head of the pancreas measuring 2.8 x 2.4 cm without signs of pancreatic ductal dilation or signs of inflammation. No nodular enhancement of this lesion. Spleen:  Normal spleen. Adrenals/Urinary Tract: Adrenal glands are normal. Symmetric renal enhancement. No hydronephrosis. No suspicious renal lesion. Stomach/Bowel: Gastroesophageal thickening with masslike appearance. Small lymph node adjacent to the GE junction. Gastroesophageal abnormality best seen on coronal image eighty of series 8. No acute gastrointestinal process to the extent evaluated on abdominal MRI. Vascular/Lymphatic: Patent abdominal vasculature though atherosclerotic narrowing of the origin of the  SMA and atherosclerotic plaque of the abdominal aorta without aneurysmal dilation. Small lymph nodes adjacent to the distal esophagus suspicious based on other findings outlined above. No retroperitoneal adenopathy. No gastrohepatic lymphadenopathy. Portal vein is patent into the liver. Hepatic arterial supply arises in a classic fashion from the celiac. Other:  No ascites. Musculoskeletal: No suspicious bone lesions identified. IMPRESSION: 1. 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 Endoscopic assessment may be helpful for further evaluation. Small lymph nodes in the area of the gastroesophageal junction also suspicious. 2.  Hepatic steatosis. 3. Cystic lesion in the head of the pancreas without high-risk features of ductal dilation or mural nodularity. 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 Consider short interval follow-up at 6 month interval common approach that may be more reasonable in the current context. Based on size a more aggressive approach would be endoscopic ultrasound and sampling. 4. Atherosclerosis with some narrowing of the origin of the SMA. Aortic Atherosclerosis (ICD10-I70.0). Electronically Signed   By: Zetta Bills M.D.   On: 01/24/2021 08:37   US BIOPSY (LIVER)  Result Date: 01/24/2021 INDICATION: 70 year old male with indeterminate liver masses. EXAM: ULTRASOUND BIOPSY CORE LIVER MEDICATIONS: None. ANESTHESIA/SEDATION: Moderate (conscious) sedation was employed during this procedure. A total of Versed 1 mg and Fentanyl 50 mcg was administered intravenously. Moderate Sedation Time: 13 minutes. The patient's level of consciousness and vital signs were monitored continuously by radiology nursing throughout the procedure under my direct supervision. COMPLICATIONS: None immediate. PROCEDURE: Informed written consent was obtained from the patient after a  thorough discussion of the procedural risks, benefits and alternatives. All questions were addressed. Maximal Sterile Barrier Technique was utilized including caps, mask, sterile gowns, sterile gloves, sterile drape, hand hygiene and skin antiseptic. A timeout was performed prior to the initiation of the procedure. Preprocedure ultrasound demonstrated safe window in the right upper quadrant for focal liver biopsy of anterior segment 4 hypodense mass measuring approximately 19 mm in maximum dimension. The right upper quadrant was prepped and draped in standard fashion. Local anesthesia was administered subdermally at the planned entry site as well as under ultrasound guidance along the hepatic capsule. A skin nick was made. A 17 gauge introducer needle was advanced to the hepatic parenchyma under ultrasound guidance. Next, a total of 2, 18 gauge core biopsies were obtained. The samples were placed in formalin and sent to Pathology. Under ultrasound guidance, a Gel-Foam slurry was administered along the needle entry tract as the introducer needle was withdrawn. Postprocedure ultrasound demonstrated no evidence of perihepatic fluid collection. The patient tolerated the procedure well. IMPRESSION: Technically successful ultrasound-guided core liver mass biopsy from hepatic segment 4. Ruthann Cancer, MD [facility] Electronically Signed   By: Ruthann Cancer M.D.   On: 01/24/2021 11:11   EEG adult  Result Date: 01/23/2021 Lora Havens, MD     01/23/2021  4:08 PM Patient Name: Caprice Wasko MRN: [id number] Epilepsy Attending: Lora Havens Referring Physician/Provider: Myra Rude, NP Date: 01/23/2021 Duration: 22.22 mins Patient history:  70 y.o. male with PMHx of CAD, HLD, DM2, COPD, HTN, OSA on CPAP, obesity, remote smoking history who presented to the ED 01/22/2021 for evaluation of right-sided weakness, numbness, and paresthesias. CTH was obtained  revealing a left parietal mass with extensive vasogenic edema.  EEG to evaluate for seizures. Level of alertness: Awake, asleep AEDs during EEG study: GBP Technical aspects: This EEG study was done with scalp electrodes positioned according to the 10-20 International system of electrode placement. Electrical activity was acquired at a sampling rate of _0  and reviewed with a high frequency filter of _1  and a low frequency filter of _2 . EEG data were recorded continuously and digitally stored. Description: The posterior dominant rhythm consists of 7.5 Hz activity of moderate voltage (25-35 uV) seen predominantly in posterior head regions, symmetric and reactive to eye opening and eye closing. Sleep was characterized by vertex waves, sleep spindles (12 to 14 Hz), maximal frontocentral region. EEG showed continuous generalized and maximal left temporoparietal 5-6 Hz theta  slowing.  Intermittent left temporoparietal 2 to 3 Hz sharply contoured and rhythmic delta slowing was also noted.  Hyperventilation and photic stimulation were not performed.   ABNORMALITY - Continuous slow, generalized and maximal left temporoparietal region -Intermittent rhythmic slow, left temporoparietal region IMPRESSION: This study showed rhythmic delta activity in left temporoparietal region which is on the ictal-interictal continuum with low potential for seizures. There is also cortical dysfunction in left temporoparietal region likely secondary to underlying structural abnormality/ mass. Additionally there is mild diffuse encephalopathy, nonspecific etiology.  No seizures or definite epileptiform discharges were seen throughout the recording. Lora Havens    ASSESSMENT: This is a very pleasant 69 year old Caucasian male diagnosed with:  1) Metastatic adenocarcinoma of the esophagus -The patient presented in August 2022 with new onset right sided right arm and leg weakness and further work-up showed a metastatic brain lesion in  the left frontoparietal white matter with extensive surrounding edema.  -The patient's upper endoscopy as well as CT scans and MRI show medium-sized, ulcerating, non-obstructing, not circumferential mass in distal esophagus.  He has metastatic disease to the liver as well as the brain.  -Biopsy from 01/26/21 via upper endoscopy (CASE: [id number]) consistent with adenocarcinoma of esophageal primary.  Dr. Burr Medico personally spoke to the pathologist to confirm the diagnosis. -The patient is planning on undergoing SRS to the metastatic brain lesion on 02/09/2021 under the care of Dr. Isidore Moos.  -The patient was seen with Dr. Burr Medico today.  Dr. Burr Medico personally and independently reviewed the patient's imaging studies and discussed the plan with the patient and his wife in detail.  Dr. Burr Medico had a lengthy discussion with the patient today about his current condition and recommended management.  -Dr. Burr Medico discussed that this is no longer curable but is treatable.  Dr. Burr Medico discussed the aggressive nature of esophageal cancer.  The goal of treatment would be to prolong his life and reduce side effects of malignancy  (i.e. pain, dysphagia, odynophagia, weight loss, etc). Dr. Burr Medico gave the patient the option of FOLFOX (5-FU oxaliplatin and leucovorin) IV every 2 weeks vs. CAPOX (capecitabine and oxaliplatin).  Capecitabine is given via oral route 14 days on and 7 days off. The benefits and adverse side effects were discussed in detail with the patient and his wife.  Given that he lives an hour away from the clinic, he is likely a better candidate for CAPOX due to requiring less office visits.  He is in agreement with pursuing CAPOX.  -The adverse side effects of treatment were discussed including but not limited to rash, nausea, vomiting, mucositis, cold sensitivity, fatigue, hand-foot syndrome, and neuropathy. -Dr. Burr Medico would not recommend palliative radiotherapy directed at the primary esophageal mass since the patient not  having significant obstructive symptoms. -I will requested MMR, PDL1, and HER-2 today.  The role of biologic agents such as HER2 directed therapies and immunotherapy were briefly discussed today.  -We will arrange for the patient to receive a chemo education class prior to starting his first cycle of treatment which will be in roughly 2 weeks. -I will arrange for the patient to have a Port-A-Cath placed.  -Obtain baseline CBC and CMP today -We will see him back for follow-up visit in 2 weeks for evaluation before starting cycle #1.   2) Metastatic disease to the brain -He presented with right arm and right leg weakness.   -Brain MRI which showed a 1.9 cm mass in the left frontoparietal white matter with extensive surrounding edema. Another punctate  left occipital metastasis was seen on the 3 Tesla MRI.  -Overall, symptoms improving since starting Decadron.  Currently taking 2 mg twice daily. -The patient is planning on having SRS to the brain lesion on 02/09/2021. He had his SIM this morning.  -Overall he notes improvement in his neurologic symptoms. -Planning to perform physical therapy and occupational therapy outpatient  3) GERD -The patient has a longstanding history of GERD. -He is currently taking pantoproazole 40 mg BID -Encouraged for him to continue taking this as he needs PPI for current steroid use and longstanding GERD  4) Comorbidities (COPD, DM, etc) -COPD controlled with inhalers. Patient quit smoking in 2018. Never required hospitalizations for COPD -DM also under good control per patient. Follows closely with PCP. On gabapentin for peripheral neuropathy.   PLAN: -Anti-emetics sent to pharmacy (zofran & compazine) -Arrange for chemo-education class  -Requested PDL1, MMR, and HER-2 -Ordered port-a-cath placement and rxed emla cream -Obtain baseline CBC and CMP today -Follow up visit in 2 weeks before starting cycle #1.   Disclaimer: This note was dictated with voice  recognition software. Similar sounding words can inadvertently be transcribed and may not be corrected upon review.   Laasia Arcos L Roxy Mastandrea February 02, 2021, 1:44 PM  Addendum  I have seen the patient, examined him. I agree with the assessment and and plan and have edited the notes.   70 yo male with PMH of GERD, HTN, DM, COPD, obesity, presented with symptomatic brain metastasis.  Work-up including endoscopy and CT/MRI showed primary tumor in the distal esophagus and multiple liver metastasis.  Both esophageal tumor and liver mass biopsy showed adenocarcinoma.  With pathologist Dr. Vic Ripper, he feels the morphology of the 2 biopsies are different, which could be seen in primary and metastatic disease, he ordered MMR, HER2 and PD-L1 on both biopsy samples.  If the results are same, that it will confirm the same origin.  Clinically, this is consistent with metastatic esophageal cancer.  Patient actually has no significant dysphagia or weight loss.  I agree with brain radiation first.  I do not think he needs palliative radiation to the primary esophageal tumor.  I discussed the incurable and aggressive nature of the disease, and discussed treatment options.  I recommend first-line chemotherapy FOLFOX after he completes radiation, and may add on immunotherapy and or trastuzumab depending on the molecular testing results.  Benefit and side effects of chemotherapy were discussed with him he agrees to proceed.  Plan to start the week after he completes radiation.  All questions were answered.  Truitt Merle  02/02/2021

## 2021-01-31 NOTE — Telephone Encounter (Signed)
Good morning Dr. Tarri Glenn, patient returned your call in regards to their results.

## 2021-02-01 ENCOUNTER — Other Ambulatory Visit: Payer: Self-pay

## 2021-02-01 MED ORDER — PANTOPRAZOLE SODIUM 40 MG PO TBEC
40.0000 mg | DELAYED_RELEASE_TABLET | Freq: Two times a day (BID) | ORAL | 3 refills | Status: DC
Start: 2021-02-01 — End: 2021-02-13

## 2021-02-02 ENCOUNTER — Inpatient Hospital Stay: Payer: Medicare Other

## 2021-02-02 ENCOUNTER — Ambulatory Visit
Admission: RE | Admit: 2021-02-02 | Discharge: 2021-02-02 | Disposition: A | Discharge status: Home / Self Care | Payer: Medicare Other | Source: Ambulatory Visit | Attending: Radiation Oncology | Admitting: Radiation Oncology

## 2021-02-02 ENCOUNTER — Other Ambulatory Visit: Payer: Self-pay

## 2021-02-02 ENCOUNTER — Inpatient Hospital Stay (HOSPITAL_BASED_OUTPATIENT_CLINIC_OR_DEPARTMENT_OTHER): Payer: Medicare Other | Admitting: Physician Assistant

## 2021-02-02 ENCOUNTER — Encounter: Payer: Self-pay | Admitting: Physician Assistant

## 2021-02-02 ENCOUNTER — Other Ambulatory Visit: Payer: Self-pay | Admitting: Hematology

## 2021-02-02 ENCOUNTER — Encounter: Payer: Self-pay | Admitting: Radiation Oncology

## 2021-02-02 VITALS — BP 142/78 | HR 67 | Resp 19

## 2021-02-02 VITALS — BP 149/81 | HR 66 | Temp 97.4°F | Resp 18 | Wt 238.5 lb

## 2021-02-02 DIAGNOSIS — G629 Polyneuropathy, unspecified: Secondary | ICD-10-CM | POA: Insufficient documentation

## 2021-02-02 DIAGNOSIS — I10 Essential (primary) hypertension: Secondary | ICD-10-CM | POA: Insufficient documentation

## 2021-02-02 DIAGNOSIS — C159 Malignant neoplasm of esophagus, unspecified: Secondary | ICD-10-CM | POA: Insufficient documentation

## 2021-02-02 DIAGNOSIS — Z7189 Other specified counseling: Secondary | ICD-10-CM | POA: Diagnosis not present

## 2021-02-02 DIAGNOSIS — Z510 Encounter for antineoplastic radiation therapy: Secondary | ICD-10-CM | POA: Diagnosis not present

## 2021-02-02 DIAGNOSIS — K219 Gastro-esophageal reflux disease without esophagitis: Secondary | ICD-10-CM | POA: Insufficient documentation

## 2021-02-02 DIAGNOSIS — J449 Chronic obstructive pulmonary disease, unspecified: Secondary | ICD-10-CM | POA: Insufficient documentation

## 2021-02-02 DIAGNOSIS — Z79899 Other long term (current) drug therapy: Secondary | ICD-10-CM | POA: Insufficient documentation

## 2021-02-02 DIAGNOSIS — C155 Malignant neoplasm of lower third of esophagus: Secondary | ICD-10-CM | POA: Diagnosis not present

## 2021-02-02 DIAGNOSIS — G9389 Other specified disorders of brain: Secondary | ICD-10-CM

## 2021-02-02 DIAGNOSIS — C7931 Secondary malignant neoplasm of brain: Secondary | ICD-10-CM | POA: Insufficient documentation

## 2021-02-02 DIAGNOSIS — Z5111 Encounter for antineoplastic chemotherapy: Secondary | ICD-10-CM | POA: Insufficient documentation

## 2021-02-02 DIAGNOSIS — Z7984 Long term (current) use of oral hypoglycemic drugs: Secondary | ICD-10-CM | POA: Insufficient documentation

## 2021-02-02 DIAGNOSIS — E119 Type 2 diabetes mellitus without complications: Secondary | ICD-10-CM | POA: Insufficient documentation

## 2021-02-02 DIAGNOSIS — Z87891 Personal history of nicotine dependence: Secondary | ICD-10-CM | POA: Insufficient documentation

## 2021-02-02 LAB — CBC WITH DIFFERENTIAL (CANCER CENTER ONLY)
Abs Immature Granulocytes: 0.14 10*3/uL — ABNORMAL HIGH (ref 0.00–0.07)
Basophils Absolute: 0 10*3/uL (ref 0.0–0.1)
Basophils Relative: 0 %
Eosinophils Absolute: 0 10*3/uL (ref 0.0–0.5)
Eosinophils Relative: 0 %
HCT: 48.5 % (ref 39.0–52.0)
Hemoglobin: 16.6 g/dL (ref 13.0–17.0)
Immature Granulocytes: 2 %
Lymphocytes Relative: 13 %
Lymphs Abs: 1.2 10*3/uL (ref 0.7–4.0)
MCH: 31.8 pg (ref 26.0–34.0)
MCHC: 34.2 g/dL (ref 30.0–36.0)
MCV: 92.9 fL (ref 80.0–100.0)
Monocytes Absolute: 0.5 10*3/uL (ref 0.1–1.0)
Monocytes Relative: 5 %
Neutro Abs: 7.6 10*3/uL (ref 1.7–7.7)
Neutrophils Relative %: 80 %
Platelet Count: 276 10*3/uL (ref 150–400)
RBC: 5.22 MIL/uL (ref 4.22–5.81)
RDW: 15.7 % — ABNORMAL HIGH (ref 11.5–15.5)
WBC Count: 9.5 10*3/uL (ref 4.0–10.5)
nRBC: 0 % (ref 0.0–0.2)

## 2021-02-02 LAB — CMP (CANCER CENTER ONLY)
ALT: 147 U/L — ABNORMAL HIGH (ref 0–44)
AST: 23 U/L (ref 15–41)
Albumin: 3.9 g/dL (ref 3.5–5.0)
Alkaline Phosphatase: 96 U/L (ref 38–126)
Anion gap: 12 (ref 5–15)
BUN: 19 mg/dL (ref 8–23)
CO2: 24 mmol/L (ref 22–32)
Calcium: 9.2 mg/dL (ref 8.9–10.3)
Chloride: 104 mmol/L (ref 98–111)
Creatinine: 0.89 mg/dL (ref 0.61–1.24)
GFR, Estimated: >60 mL/min (ref >60)
Glucose, Bld: 143 mg/dL — ABNORMAL HIGH (ref 70–99)
Potassium: 4.2 mmol/L (ref 3.5–5.1)
Sodium: 140 mmol/L (ref 135–145)
Total Bilirubin: 0.6 mg/dL (ref 0.3–1.2)
Total Protein: 6.8 g/dL (ref 6.5–8.1)

## 2021-02-02 MED ORDER — LIDOCAINE-PRILOCAINE 2.5-2.5 % EX CREA: 1.0000 "application " | TOPICAL_CREAM | CUTANEOUS | 2 refills | Status: AC | PRN

## 2021-02-02 MED ORDER — ONDANSETRON HCL 8 MG PO TABS: 8.0000 mg | ORAL_TABLET | Freq: Three times a day (TID) | ORAL | 2 refills | Status: DC | PRN

## 2021-02-02 MED ORDER — SODIUM CHLORIDE 0.9% FLUSH
10.0000 mL | Freq: Once | INTRAVENOUS | Status: AC
  Administered 2021-02-02: 10 mL via INTRAVENOUS

## 2021-02-02 MED ORDER — PROCHLORPERAZINE MALEATE 10 MG PO TABS: 10.0000 mg | ORAL_TABLET | Freq: Four times a day (QID) | ORAL | 2 refills | Status: DC | PRN

## 2021-02-02 NOTE — Progress Notes (Signed)
Has armband been applied?  Yes.    Does patient have an allergy to IV contrast dye?: No.   Has patient ever received premedication for IV contrast dye?: No.   Does patient take metformin?: Yes.    If patient does take metformin when was the last dose: 02/01/2021  Date of lab work: January 26, 2021 BUN: 24 CR: 1.06 eGFR: >60  IV site: forearm right, condition patent and no redness  Has IV site been added to flowsheet?  Yes.    BP (!) 142/78 (BP Location: Left Arm, Patient Position: Sitting)   Pulse 67   Resp 19   SpO2 97%   

## 2021-02-02 NOTE — Patient Instructions (Addendum)
Summary:  -There are two main categories of esophageal cancer.  -The sample (biopsy) that they took of your tumor was consistent with a subtype called Adenocarcinoma. -We covered a lot of important information at your appointment today regarding what the treatment plan is moving forward.  The goal of treatment is to help control the disease and prolong your life and reduce the adverse effects of cancer (ex: pain, difficulties swallowing, etc). Here are the the main points that were discussed at your office visit with [place] today:  -The treatments options are: 1) FOLFOX which is 2 chemotherapy drugs (5-FU and oxaliplatin and a vitamin leucovorin).  This is given by an IV every 2 weeks.  However, you go home with a pump on the day of treatment and have to come back a few days later to get the pump removed.  Given how far you live from the clinic, Dr. Burr Medico believes that it may be better to go with the second option 2) CAPOX (capecitabine (pill) and oxaliplatin (IV) is given every 3 weeks.  You take a pill for 14 days and then off the pill for 7 days.  You then also receive 1 chemotherapy drug via an IV on the first day of treatment every 3 weeks.  The oral chemotherapy pharmacist will be in touch with you regarding patient education and when the medication is available for pickup. -We also performed special molecular test on the biopsy to see if Dr. Burr Medico would like to add immunotherapy or another biologic drug to your treatment plan.  I would expect these results to come back the next 10 to 14 days.  We will have them back by the time we see you before you start any treatment -We are planning on starting your treatment ~2 weeks 02/16/21 but before your start your treatment, I would like you to attend a Chemotherapy Education Class. This involves having you sit down with one of our nurse educators. She will discuss with your one-on-one more details about your treatment as well as general information about resources  here at the cancer center.  We will try and schedule this on the same day you come in for your SRS treatment so you avoid taking multiple trips to the cancer center.  They also offer this virtually if you prefer that as well.   Medications:  -I have sent a few important medication prescriptions to your pharmacy.  -Compazine was sent to your pharmacy. This medication is for nausea. You may take this every 6 hours as needed if you feel nauseous.  -I have also sent a prescription for Zofran 8 mg every 8 hours as needed for nausea starting 3 days after chemotherapy -I have also sent a prescription for the numbing cream.  We will arrange for you to have a Port-A-Cath placed.  This is a minor surgical procedure that inserts a device that can be used to giving chemotherapy and be used for lab draws.  Once able to use the numbing cream, you can put some on top of your port 1 hour to 30 minutes before your appointment and cover in Saran wrap.  By the time you get to your appointment this will be nice and numb  Referrals or Imaging: -Dr. Burr Medico does not believe that you need palliative radiation to the main spot in the esophagus since it is not causing any symptoms of blockage.  -We have social workers, dietitians, and Mining engineer at work here.  If you feel that you  need to see one of them, let us know and we will help you make the connection with them.  Follow up:  -We will see you back for a follow up visit in 2 weeks before you start your first treatment   -If you need to reach [place] at any time, the main office number to the cancer center is [phone].

## 2021-02-02 NOTE — Progress Notes (Signed)
I met with Jason Snyder and Jason Snyder Metts during [facility] consult appointment with Dr Burr Medico and [facility], PA-C.  I reviewed my role as GI Oncology Nurse Navigator and provided my direct contact information.  I showed them my sample port-a-cath and provided basic explanation of insertion and use.  I explained the definition of a treatment cycle and reviewed that he would start his xeloda the day he gets his iv chemotherapy, oxaliplatin.  I reviewed and provided written material on the following side effects: cold sensitivity, hand foot syndrome, and mouth sores.  I stressed the importance of calling the cancer center to let us know if he has unmanageable side effects so that we can provide intervention.  All questions were answered.  Jason Snyder and Jason Snyder Bissonnette verbalized understanding.

## 2021-02-02 NOTE — Progress Notes (Signed)
START ON PATHWAY REGIMEN - Gastroesophageal     A cycle is every 14 days:     Oxaliplatin      Leucovorin      Fluorouracil      Fluorouracil   **Always confirm dose/schedule in your pharmacy ordering system**  Patient Characteristics: Distant Metastases (cM1/pM1) / Locally Recurrent Disease, Adenocarcinoma - Esophageal, GE Junction, and Gastric, First Line, HER2 Negative/Unknown, PD?L1 Expression CPS < 5/Negative/Unknown, MSS/pMMR or MSI Unknown Histology: Adenocarcinoma Disease Classification: Esophageal Therapeutic Status: Distant Metastases (No Additional Staging) Line of Therapy: First Line HER2 Status: Quantity Not Sufficient PD-L1 Expression Status: Awaiting Test Results Microsatellite/Mismatch Repair Status: Unknown Intent of Therapy: Non-Curative / Palliative Intent, Discussed with Patient

## 2021-02-06 ENCOUNTER — Telehealth: Payer: Self-pay | Admitting: Physician Assistant

## 2021-02-06 NOTE — Telephone Encounter (Signed)
Scheduled appointment per 09/02 los. Patient is aware. 

## 2021-02-07 DIAGNOSIS — C7931 Secondary malignant neoplasm of brain: Secondary | ICD-10-CM | POA: Diagnosis not present

## 2021-02-07 DIAGNOSIS — Z510 Encounter for antineoplastic radiation therapy: Secondary | ICD-10-CM | POA: Diagnosis not present

## 2021-02-07 DIAGNOSIS — Z5111 Encounter for antineoplastic chemotherapy: Secondary | ICD-10-CM | POA: Diagnosis not present

## 2021-02-07 DIAGNOSIS — C155 Malignant neoplasm of lower third of esophagus: Secondary | ICD-10-CM | POA: Diagnosis not present

## 2021-02-08 ENCOUNTER — Inpatient Hospital Stay: Payer: Medicare Other

## 2021-02-09 ENCOUNTER — Other Ambulatory Visit: Payer: Self-pay

## 2021-02-09 ENCOUNTER — Encounter: Payer: Self-pay | Admitting: Radiation Oncology

## 2021-02-09 ENCOUNTER — Other Ambulatory Visit: Payer: Self-pay | Admitting: Student

## 2021-02-09 ENCOUNTER — Ambulatory Visit
Admission: RE | Admit: 2021-02-09 | Discharge: 2021-02-09 | Disposition: A | Discharge status: Home / Self Care | Payer: Medicare Other | Source: Ambulatory Visit | Attending: Radiation Oncology | Admitting: Radiation Oncology

## 2021-02-09 VITALS — BP 148/79 | HR 66 | Temp 97.7°F | Resp 20

## 2021-02-09 DIAGNOSIS — Z510 Encounter for antineoplastic radiation therapy: Secondary | ICD-10-CM | POA: Diagnosis not present

## 2021-02-09 DIAGNOSIS — C7931 Secondary malignant neoplasm of brain: Secondary | ICD-10-CM

## 2021-02-09 DIAGNOSIS — C155 Malignant neoplasm of lower third of esophagus: Secondary | ICD-10-CM | POA: Diagnosis not present

## 2021-02-09 DIAGNOSIS — Z8501 Personal history of malignant neoplasm of esophagus: Secondary | ICD-10-CM | POA: Diagnosis not present

## 2021-02-09 DIAGNOSIS — Z5111 Encounter for antineoplastic chemotherapy: Secondary | ICD-10-CM | POA: Diagnosis not present

## 2021-02-09 MED ORDER — DEXAMETHASONE 2 MG PO TABS: 2.0000 mg | ORAL_TABLET | Freq: Two times a day (BID) | ORAL | 1 refills | Status: DC

## 2021-02-09 NOTE — Progress Notes (Signed)
  Radiation Oncology         [phone]) [phone] ________________________________  Name: Jason Snyder MRN: [id number]  Date: 02/09/2021  DOB: [birth year]  Stereotactic Treatment Procedure Note  SPECIAL TREATMENT PROCEDURE  Outpatient    ICD-10-CM   1. Brain metastases ([place])  C79.31 dexamethasone (DECADRON) 2 MG tablet      3D TREATMENT PLANNING AND DOSIMETRY:  The patient's radiation plan was reviewed and approved by neurosurgery and radiation oncology prior to treatment.  It showed 3-dimensional radiation distributions overlaid onto the planning CT/MRI image set.  The Jason Snyder for the target structures as well as the organs at risk were reviewed. The documentation of the 3D plan and dosimetry are filed in the radiation oncology EMR.  NARRATIVE:  Jason Snyder was brought to the TrueBeam stereotactic radiation treatment machine and placed supine on the CT couch. The head frame was applied, and the patient was set up for stereotactic radiosurgery.  Neurosurgery was present for the set-up and delivery  SIMULATION VERIFICATION:  In the couch zero-angle position, the patient underwent Exactrac imaging using the Brainlab system with orthogonal KV images.  These were carefully aligned and repeated to confirm treatment position for each of the isocenters.  The Exactrac snap film verification was repeated at each couch angle.  SPECIAL TREATMENT PROCEDURE: Jason Snyder received stereotactic radiosurgery to the following targets:  21 mm left parietal tumor received 18 Gray in 1 fraction.  3 mm left occipital tumor received 20 Gray in 1 fraction   SRS IMRT technique was used.  6 MV flattening filter free photons used.  ExacTrac Snap verification was performed for each couch angle.  This constitutes a special treatment procedure due to the ablative dose delivered and the technical nature of treatment.  This highly technical modality of treatment ensures that the ablative dose is centered on the patient's tumor while  sparing normal tissues from excessive dose and risk of detrimental effects.  STEREOTACTIC TREATMENT MANAGEMENT:  Following delivery, the patient was transported to nursing in stable condition and monitored for possible acute effects.  Vital signs were recorded BP (!) 148/79 (BP Location: Left Arm, Patient Position: Sitting, Cuff Size: Large)   Pulse 66   Temp 97.7 F (36.5 C)   Resp 20   SpO2 97% .  No oral thrush noted on exam.  The patient tolerated treatment without significant acute effects, and was discharged to home in stable condition.    PLAN: Follow-up in one month.  Dexamethasone refilled and steroid taper documented in prescription.  ________________________________   Jason Gibson, MD

## 2021-02-09 NOTE — Op Note (Signed)
  Name: Rohil Minzey  MRN: [id number]  Date: 02/09/2021   DOB: June 15, 1950  Stereotactic Radiosurgery Operative Note  PRE-OPERATIVE DIAGNOSIS:  Esophageal adenocarcinoma with brain metastases  POST-OPERATIVE DIAGNOSIS:  Same  PROCEDURE:  Stereotactic Radiosurgery  SURGEON:  [name], MD  NARRATIVE: The patient underwent a radiation treatment planning session in the radiation oncology simulation suite under the care of the radiation oncology physician and physicist.  I participated closely in the radiation treatment planning afterwards. The patient underwent planning CT which was fused to 3T high resolution MRI with 1 mm axial slices.  These images were fused on the planning system.  We contoured the gross target volumes and subsequently expanded this to yield the Planning Target Volume. I actively participated in the planning process.  I helped to define and review the target contours and also the contours of the optic pathway, eyes, brainstem and selected nearby organs at risk.  All the dose constraints for critical structures were reviewed and compared to AAPM Task Group 101.  The prescription dose conformity was reviewed.  I approved the plan electronically.    Accordingly, [name] was brought to the TrueBeam stereotactic radiation treatment linac and placed in the custom immobilization mask.  The patient was aligned according to the IR fiducial markers with BrainLab Exactrac, then orthogonal x-rays were used in ExacTrac with the 6DOF robotic table and the shifts were made to align the patient  [name] received stereotactic radiosurgery uneventfully.    Lesions treated:  2   Complex lesions treated:  0 (>3.5 cm, <66m of optic path, or within the brainstem)   The detailed description of the procedure is recorded in the radiation oncology procedure note.  I was present for the duration of the procedure.  DISPOSITION:  Following delivery, the patient was transported to nursing in  stable condition and monitored for possible acute effects to be discharged to home in stable condition with follow-up in one month.  TJudith Part MD 02/09/2021 3:58 PM

## 2021-02-09 NOTE — Progress Notes (Signed)
Patient rested with [place] for 30 minutes following his SRS treatment.  Patient denies headache, dizziness, nausea, diplopia or ringing in the ears. Denies fatigue. Patient without complaints. Understands to avoid strenuous activity for the next 24 hours and call [phone] with needs.

## 2021-02-10 ENCOUNTER — Other Ambulatory Visit: Payer: Self-pay | Admitting: Hematology

## 2021-02-12 ENCOUNTER — Other Ambulatory Visit: Payer: Self-pay | Admitting: Hematology

## 2021-02-12 ENCOUNTER — Encounter: Payer: Self-pay | Admitting: Hematology

## 2021-02-12 ENCOUNTER — Encounter (HOSPITAL_COMMUNITY): Payer: Self-pay

## 2021-02-12 ENCOUNTER — Other Ambulatory Visit (HOSPITAL_COMMUNITY): Payer: Self-pay

## 2021-02-12 ENCOUNTER — Telehealth: Payer: Self-pay | Admitting: Pharmacist

## 2021-02-12 ENCOUNTER — Other Ambulatory Visit: Payer: Self-pay

## 2021-02-12 ENCOUNTER — Telehealth: Payer: Self-pay

## 2021-02-12 ENCOUNTER — Ambulatory Visit (HOSPITAL_COMMUNITY)
Admission: RE | Admit: 2021-02-12 | Discharge: 2021-02-12 | Disposition: A | Discharge status: Home / Self Care | Payer: Medicare Other | Source: Ambulatory Visit | Attending: Physician Assistant | Admitting: Physician Assistant

## 2021-02-12 DIAGNOSIS — Z452 Encounter for adjustment and management of vascular access device: Secondary | ICD-10-CM | POA: Diagnosis not present

## 2021-02-12 DIAGNOSIS — C159 Malignant neoplasm of esophagus, unspecified: Secondary | ICD-10-CM | POA: Insufficient documentation

## 2021-02-12 DIAGNOSIS — Z7982 Long term (current) use of aspirin: Secondary | ICD-10-CM | POA: Insufficient documentation

## 2021-02-12 DIAGNOSIS — Z87891 Personal history of nicotine dependence: Secondary | ICD-10-CM | POA: Diagnosis not present

## 2021-02-12 DIAGNOSIS — Z7984 Long term (current) use of oral hypoglycemic drugs: Secondary | ICD-10-CM | POA: Diagnosis not present

## 2021-02-12 DIAGNOSIS — C155 Malignant neoplasm of lower third of esophagus: Secondary | ICD-10-CM

## 2021-02-12 DIAGNOSIS — Z79899 Other long term (current) drug therapy: Secondary | ICD-10-CM | POA: Insufficient documentation

## 2021-02-12 DIAGNOSIS — I509 Heart failure, unspecified: Secondary | ICD-10-CM

## 2021-02-12 HISTORY — PX: IR IMAGING GUIDED PORT INSERTION: IMG5740

## 2021-02-12 LAB — GLUCOSE, CAPILLARY: Glucose-Capillary: 115 mg/dL — ABNORMAL HIGH (ref 70–99)

## 2021-02-12 MED ORDER — SODIUM CHLORIDE 0.9 % IV SOLN: INTRAVENOUS | Status: DC

## 2021-02-12 MED ORDER — FENTANYL CITRATE (PF) 100 MCG/2ML IJ SOLN
INTRAMUSCULAR | Status: DC | PRN
  Administered 2021-02-12: 50 ug via INTRAVENOUS

## 2021-02-12 MED ORDER — MIDAZOLAM HCL 2 MG/2ML IJ SOLN
INTRAMUSCULAR | Status: DC | PRN
  Administered 2021-02-12: 1 mg via INTRAVENOUS

## 2021-02-12 MED ORDER — LIDOCAINE-EPINEPHRINE 1 %-1:100000 IJ SOLN
INTRAMUSCULAR | Status: AC
  Filled 2021-02-12: qty 1

## 2021-02-12 MED ORDER — HEPARIN SOD (PORK) LOCK FLUSH 100 UNIT/ML IV SOLN
INTRAVENOUS | Status: DC | PRN
  Administered 2021-02-12: 500 [IU] via INTRAVENOUS

## 2021-02-12 MED ORDER — PROCHLORPERAZINE MALEATE 10 MG PO TABS: 10.0000 mg | ORAL_TABLET | Freq: Four times a day (QID) | ORAL | 1 refills | Status: AC | PRN

## 2021-02-12 MED ORDER — FENTANYL CITRATE (PF) 100 MCG/2ML IJ SOLN
INTRAMUSCULAR | Status: AC
  Filled 2021-02-12: qty 2

## 2021-02-12 MED ORDER — LIDOCAINE-PRILOCAINE 2.5-2.5 % EX CREA: TOPICAL_CREAM | CUTANEOUS | 3 refills | Status: DC

## 2021-02-12 MED ORDER — ONDANSETRON HCL 8 MG PO TABS: 8.0000 mg | ORAL_TABLET | Freq: Two times a day (BID) | ORAL | 1 refills | Status: AC | PRN

## 2021-02-12 MED ORDER — LIDOCAINE HCL 1 % IJ SOLN
INTRAMUSCULAR | Status: DC | PRN
  Administered 2021-02-12: 20 mL

## 2021-02-12 MED ORDER — MIDAZOLAM HCL 2 MG/2ML IJ SOLN
INTRAMUSCULAR | Status: AC
  Filled 2021-02-12: qty 4

## 2021-02-12 MED ORDER — LIDOCAINE HCL 1 % IJ SOLN
INTRAMUSCULAR | Status: AC
  Filled 2021-02-12: qty 20

## 2021-02-12 MED ORDER — HEPARIN SOD (PORK) LOCK FLUSH 100 UNIT/ML IV SOLN
INTRAVENOUS | Status: AC
  Filled 2021-02-12: qty 5

## 2021-02-12 MED ORDER — CAPECITABINE 500 MG PO TABS
850.0000 mg/m2 | ORAL_TABLET | Freq: Two times a day (BID) | ORAL | 0 refills | Status: DC
  Filled 2021-02-12: qty 112, 14d supply, fill #0
  Filled 2021-02-13: qty 112, 21d supply, fill #0

## 2021-02-12 MED ORDER — CAPECITABINE 500 MG PO TABS
850.0000 mg/m2 | ORAL_TABLET | Freq: Two times a day (BID) | ORAL | 0 refills | Status: DC
Start: 2021-02-12 — End: 2021-02-12

## 2021-02-12 NOTE — Progress Notes (Signed)
Jason Snyder left a vm regarding his treatment.  He was getting his port placed today.  He has an appt with Dr Burr Medico tomorrow.

## 2021-02-12 NOTE — Procedures (Signed)
Interventional Radiology Procedure:   Indications: Metastatic esophageal cancer  Procedure: Port placement  Findings: Right jugular port, tip at SVC/RA junction  Complications: None     EBL: Minimal, less than 10 ml  Plan: Discharge in one hour.  Keep port site and incisions dry for at least 24 hours.     Akram Kissick R. Anselm Pancoast, MD  Pager: [phone]

## 2021-02-12 NOTE — Telephone Encounter (Signed)
Oral Oncology Pharmacist Encounter  Received new prescription for Xeloda (capecitabine) for the treatment of metastatic esophageal cancer in conjunction with oxaliplatin, trastuzumab, and pembrolizumab, planned duration until disease progression or unacceptable drug toxicity.  Prescription dose and frequency assessed for appropriateness. Appropriate for therapy initiation.   CBC w/ Diff and CMP from 02/02/21 assessed, no relevant lab abnormalities noted.   Current medication list in Epic reviewed, DDIs with Xeloda identified: Category C DDI between Xeloda and Protonix - proton-pump inhibitors can decrease efficacy of Xeloda - will discuss with patient alternatives to pantoprazole, such as H2RA's like famotidine while on Xeloda.  Evaluated chart and no patient barriers to medication adherence noted.   Patient agreement for treatment documented in MD note on 02/02/21.  Prescription has been e-scribed to the [facility] for benefits analysis and approval. Patient's copay first fill of Xeloda is $22.83.  Oral Oncology Clinic will continue to follow for insurance authorization, copayment issues, initial counseling and start date.  Leron Croak, PharmD, BCPS Hematology/Oncology Clinical Pharmacist [facility] [phone] 02/12/2021 3:24 PM

## 2021-02-12 NOTE — H&P (Signed)
Chief Complaint: Patient was seen in consultation today for insertion of port-a-cath at the request of [place] Physician: Markus Daft  Patient Status: [place]  History of Present Illness: Jason Snyder, a 70 y.o. male with metastatic esophageal cancer, presents for placement of image guided port-a-cath today.  Expects to begin chemotherapy around 02/15/21.  He presently denies N/V, pain, SOB, weakness, or any general complaint today.  His wife will drive him home and be with him overnight.  He is NPO and agreeable to proceed.  Past Medical History:  Diagnosis Date   BMI 30.0-30.9,adult    BPH without obstruction/lower urinary tract symptoms 10/03/2017   CAD (coronary artery disease)    cath 2020, single vessel disease, medical mgt   Chronic left shoulder pain c   Chronic low back pain with right-sided sciatica    Chronic pain 10/07/2013   Combined hyperlipidemia associated with type 2 diabetes mellitus ([place]) 07/09/2019   Controlled type 2 diabetes mellitus without complication, without long-term current use of insulin ([place]) 07/12/2014   COPD (chronic obstructive pulmonary disease) ([place]) 01/07/2014   PFTs 07/2018 minimal airway obstruction, overinflation and no response to bronchodilators.    Coronary artery disease involving native coronary artery of native heart without angina pectoris    Cardiac Cath 01/2019: Severe single-vessel disease with additional moderate two-vessel disease: 100% CTO of RCA, moderate disease in proximal LCx, distal LCx and very small caliber 1st Diag DFR-FFR negative lesion in proximal LCx Normal LV pressures with previously document, ed normal EF.   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   Hypogonadism - pineal glad abnormalities On testosterone replacement   Obesity (BMI 30-39.9)    Obesity (BMI 30.0-34.9) 10/03/2017   OSA on CPAP 10/07/2013   Primary osteoarthritis involving multiple joints 01/10/2015   Rosacea    Smoking greater than 30 pack years - quit 03/2018 10/03/2017   Thoracic and lumbosacral neuritis    Type 2 diabetes mellitus with peripheral neuropathy ([place]) 07/12/2014    Past Surgical History:  Procedure Laterality Date   ARTHROSCOPIC REPAIR ACL     BIOPSY  01/26/2021   Procedure: BIOPSY;  Surgeon: Thornton Park, MD;  Location: [facility] ENDOSCOPY;  Service: Gastroenterology;;   CARDIAC CATHETERIZATION     ESOPHAGOGASTRODUODENOSCOPY (EGD) WITH PROPOFOL N/A 01/26/2021   Procedure: ESOPHAGOGASTRODUODENOSCOPY (EGD) WITH PROPOFOL;  Surgeon: Thornton Park, MD;  Location: [facility];  Service: Gastroenterology;  Laterality: N/A;   INTRAVASCULAR PRESSURE WIRE/FFR STUDY N/A 01/01/2019   Procedure: INTRAVASCULAR PRESSURE WIRE/FFR STUDY;  Surgeon: Leonie Man, MD;  Location: [facility];  Service: Cardiovascular;  Laterality: N/A;   LEFT HEART CATH AND CORONARY ANGIOGRAPHY N/A 01/01/2019   Procedure: LEFT HEART CATH AND CORONARY ANGIOGRAPHY;  Surgeon: Leonie Man, MD;  Location: [facility];  Service: Cardiovascular;  Laterality: N/A;   PROSTATE CRYOABLATION     03/2019   SHOULDER ARTHROSCOPY     SPINE SURGERY      Allergies: Rubus fruticosus  Medications: Prior to Admission medications  Medication Sig Start Date End Date Taking? Authorizing Provider  acetaminophen-codeine (TYLENOL #3) 300-30 MG tablet Take 1 tablet by mouth 2 (two) times daily as needed for moderate pain. 01/29/21  Yes Marin Olp, MD  albuterol  (VENTOLIN HFA) 108 (90 Base) MCG/ACT inhaler Inhale 2 puffs by mouth into the lungs every 4 hours as needed for wheezing 12/05/20  Yes [name], MD  amLODipine (NORVASC) 10 MG tablet Take 1 tablet (10 mg total) by mouth daily. 01/26/21  Yes Simmons-Robinson, Makiera, MD  aspirin 81 MG EC tablet Take 81 mg by mouth daily.    Yes [provider]  atorvastatin (LIPITOR) 20 MG tablet Take 1 tablet (20 mg total) by mouth at bedtime. 07/12/20  Yes [name], MD  cyclobenzaprine (FLEXERIL) 10 MG tablet Take 1 tablet (10 mg total) by mouth 3 (three) times daily as needed for muscle spasms. 07/12/20  Yes [name], MD  dexamethasone (DECADRON) 2 MG tablet Take 1 tablet (2 mg total) by mouth every 12 (twelve) hours. Taper to 31m daily on 9/30. Taper to 212mevery other day on 10/21. Last dose: 11/1. 02/09/21  Yes SqEppie GibsonMD  FARXIGA 10 MG TABS tablet TAKE ONE TABLET BY MOUTH ONE TIME DAILY Patient taking differently: Take 10 mg by mouth daily. 08/28/20  Yes AnLeamon ArntMD  gabapentin (NEURONTIN) 300 MG capsule Take 1 capsule (300 mg total) by mouth 3 (three) times daily. 01/11/21  Yes AnLeamon ArntMD  metFORMIN (GLUCOPHAGE) 500 MG tablet Take 1 tablet (500 mg total) by mouth 2 (two) times daily with a meal. 01/11/21  Yes AnLeamon ArntMD  metoprolol tartrate (LOPRESSOR) 50 MG tablet Take 1 tablet (50 mg total) by mouth 2 (two) times daily. 04/11/20  Yes AnLeamon ArntMD  Multiple Vitamin (MULTIVITAMIN) capsule Take 1 capsule by mouth daily.    Yes [provider]  pantoprazole (PROTONIX) 40 MG tablet Take 1 tablet (40 mg total) by mouth 2 (two) times daily before a meal. 02/01/21  Yes BeThornton ParkMD  tamsulosin (FLOMAX) 0.4 MG CAPS capsule TAKE TWO CAPSULES BY MOUTH DAILY Patient taking differently: Take 0.4 mg by mouth daily. 03/02/20  Yes AnLeamon ArntMD  Tiotropium Bromide-Olodaterol (STIOLTO RESPIMAT) 2.5-2.5 MCG/ACT AERS Inhale 2 puffs into the lungs  daily. 10/10/20  Yes Mannam, PrHart RobinsonsMD  Accu-Chek Softclix Lancets lancets Use as directed up to 4 times daily 01/26/21   Simmons-Robinson, Makiera, MD  acetic acid 2 % otic solution Place 4 drops into both ears 2 (two) times daily as needed. Patient taking differently: Place 4 drops into both ears 2 (two) times daily as needed (pain). 10/06/19   AnLeamon ArntMD  Blood Glucose Monitoring Suppl (BLOOD GLUCOSE MONITOR SYSTEM) w/Device KIT Please check blood glucose three times daily (before meals in the morning and 2 hours after largest meals of the day OR lunch and dinner) 01/26/21   Simmons-Robinson, MaRiki SheerMD  diclofenac sodium (VOLTAREN) 1 % GEL Apply 2 g topically 4 (four) times daily as needed (pain). 04/21/17   [provider]  fluticasone (FLONASE) 50 MCG/ACT nasal spray Place 1 spray into the nose daily as needed for allergies.  04/09/13   [provider]  glucose blood (ACCU-CHEK GUIDE) test strip Use as instructed up to 4 times daily 01/26/21   Simmons-Robinson, MaRiki SheerMD  Ketotifen Fumarate (ALLERGY EYE DROPS OP) Place 1 drop into both eyes daily as needed (allergy).  [provider]  lidocaine-prilocaine (EMLA) cream Apply 1 application topically as needed. 02/02/21   Heilingoetter, Cassandra L, PA-C  Loratadine 10 MG CAPS Take 10 mg by mouth daily as needed (allergies).     [provider]  nitroGLYCERIN (NITROSTAT) 0.4 MG SL tablet Place 1 tablet (0.4 mg total) under the tongue every 5 (five) minutes as needed for chest pain. 08/28/20   Richardo Priest, MD  ondansetron (ZOFRAN) 8 MG tablet Take 1 tablet (8 mg total) by mouth every 8 (eight) hours as needed for nausea or vomiting. 02/02/21   Heilingoetter, Cassandra L, PA-C  prochlorperazine (COMPAZINE) 10 MG tablet Take 1 tablet (10 mg total) by mouth every 6 (six) hours as needed. 02/02/21   Heilingoetter, Cassandra L, PA-C  Spacer/Aero-Holding Chambers (AEROCHAMBER MV) inhaler Use as instructed 11/20/20    Magdalen Spatz, NP     Family History  Problem Relation Age of Onset   Pulmonary embolism Mother    Heart disease Father    Lung cancer Father    Lung cancer Sister    Heart disease Maternal Uncle     Social History   Socioeconomic History   Marital status: Married    Spouse name: Not on file   Number of children: Not on file   Years of education: Not on file   Highest education level: Not on file  Occupational History   Occupation: retired  Tobacco Use   Smoking status: Former    Packs/day: 0.50    Years: 30.00    Pack years: 15.00    Types: Cigarettes    Quit date: 03/05/2018    Years since quitting: 2.9   Smokeless tobacco: Never   Tobacco comments:    no cigarettes since 03/05/18  Vaping Use   Vaping Use: Never used  Substance and Sexual Activity   Alcohol use: Not Currently   Drug use: Not Currently   Sexual activity: Not Currently  Other Topics Concern   Not on file  Social History Narrative   Not on file   Social Determinants of Health   Financial Resource Strain: Low Risk    Difficulty of Paying Living Expenses: Not hard at all  Food Insecurity: No Food Insecurity   Worried About Charity fundraiser in the Last Year: Never true   [place] in the Last Year: Never true  Transportation Needs: No Transportation Needs   Lack of Transportation (Medical): No   Lack of Transportation (Non-Medical): No  Physical Activity: Insufficiently Active   Days of Exercise per Week: 7 days   Minutes of Exercise per Session: 20 min  Stress: No Stress Concern Present   Feeling of Stress : Not at all  Social Connections: Moderately Isolated   Frequency of Communication with Friends and Family: More than three times a week   Frequency of Social Gatherings with Friends and Family: Once a week   Attends Religious Services: Never   Marine scientist or Organizations: No   Attends Music therapist: Never   Marital Status: Married    Review of  Systems: A 12 point ROS discussed and pertinent positives are indicated in the HPI above.  All other systems are negative.  Review of Systems  Constitutional: Negative.  Negative for activity change, appetite change and fever.  HENT: Negative.    Respiratory: Negative.  Negative for cough and shortness of breath.   Cardiovascular: Negative.   Gastrointestinal: Negative.   Neurological:  Negative  for dizziness, weakness, light-headedness and headaches.  Psychiatric/Behavioral:  Negative for behavioral problems and confusion.    Vital Signs: BP (!) 143/91   Pulse 69   Temp 98.1 F (36.7 C) (Oral)   Resp 16   SpO2 95%   Physical Exam Constitutional:      Appearance: Normal appearance.  HENT:     Head: Normocephalic and atraumatic.     Mouth/Throat:     Mouth: Mucous membranes are moist.     Pharynx: Oropharynx is clear.  Cardiovascular:     Rate and Rhythm: Normal rate and regular rhythm.     Pulses: Normal pulses.     Heart sounds: Normal heart sounds.     Comments: +1 edema of bilateral lower legs Pulmonary:     Effort: Pulmonary effort is normal.     Breath sounds: Normal breath sounds.  Abdominal:     General: Abdomen is flat.     Palpations: Abdomen is soft.  Skin:    General: Skin is warm and dry.     Capillary Refill: Capillary refill takes less than 2 seconds.  Neurological:     Mental Status: He is alert and oriented to person, place, and time.  Psychiatric:        Mood and Affect: Mood normal.        Behavior: Behavior normal.        Thought Content: Thought content normal.        Judgment: Judgment normal.    Imaging: CT HEAD WO CONTRAST  Result Date: 01/22/2021 CLINICAL DATA:  Neuro deficit, acute, stroke suspected. Right arm and leg numbness. EXAM: CT HEAD WITHOUT CONTRAST TECHNIQUE: Contiguous axial images were obtained from the base of the skull through the vertex without intravenous contrast. COMPARISON:  None. FINDINGS: Brain: There is a 1.3 cm mass  in the left parietal lobe with extensive surrounding vasogenic edema. The mass is heterogeneously hyperdense which may reflect hypercellularity or hemorrhage. There is regional sulcal effacement and mass effect on the left lateral ventricle without midline shift. No second lesion is identified on this unenhanced study. No acute cortically based infarct or extra-axial fluid collection is evident. Vascular: Calcified atherosclerosis at the skull base. No hyperdense vessel. Skull: No fracture or suspicious osseous lesion. Sinuses/Orbits: Mild right frontal and ethmoid sinus mucosal thickening. Trace left mastoid effusion. Unremarkable orbits. Other: None. IMPRESSION: Left parietal mass with extensive vasogenic edema most concerning for a metastasis. Brain MRI without and with contrast is recommended for further evaluation. These results were called by telephone at the time of interpretation on 01/22/2021 at 1:56 pm to provider Alyse Low, who verbally acknowledged these results. Electronically Signed   By: Logan Bores M.D.   On: 01/22/2021 13:57   CT Chest W Contrast  Result Date: 01/22/2021 CLINICAL DATA:  Intracranial metastases without known primary malignancy EXAM: CT CHEST, ABDOMEN, AND PELVIS WITH CONTRAST TECHNIQUE: Multidetector CT imaging of the chest, abdomen and pelvis was performed following the standard protocol during bolus administration of intravenous contrast. CONTRAST:  127m OMNIPAQUE IOHEXOL 300 MG/ML  SOLN COMPARISON:  11/03/2020 FINDINGS: CT CHEST FINDINGS Cardiovascular: The heart and great vessels are grossly unremarkable. There is trace pericardial fluid anteriorly. Moderate atherosclerosis of the aorta, great vessel origins, and coronary vasculature. Mediastinum/Nodes: No enlarged mediastinal, hilar, or axillary lymph nodes. Thyroid gland, trachea, and esophagus demonstrate no significant findings. Small hiatal hernia. Lungs/Pleura: No acute airspace disease, effusion, or pneumothorax. No  pulmonary nodules or masses. The central airways are widely patent.  Musculoskeletal: No acute or destructive bony lesions. Reconstructed images demonstrate no additional findings. CT ABDOMEN PELVIS FINDINGS Hepatobiliary: 1.3 cm cyst is identified within the superior aspect right lobe liver image 52/3. There is a 1.5 cm indeterminate hypodensity within the right lobe liver adjacent to the gallbladder fossa, reference image 63/3, concerning for metastatic disease given intracranial findings. No other focal liver abnormalities are observed. Gallbladder is unremarkable. 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 Lobular cystic structures seen in the region of the uncinate process of the pancreas, measuring 2.9 x 2.5 cm. This cystic mass demonstrates no internal nodularity or septations, most consistent with sequela of chronic pancreatitis and small pseudocyst. Spleen: Normal in size without focal abnormality. Adrenals/Urinary Tract: The kidneys enhance normally and symmetrically. The bladder is severely distended, which could be due to chronic bladder outlet obstruction. Mild thickening of the body of the left adrenal gland measuring 7 mm without focal abnormality. Right adrenal is normal. Stomach/Bowel: No bowel obstruction or ileus. The appendix, if still present, is not well visualized. No bowel wall thickening or inflammatory change. Vascular/Lymphatic: There is extensive atherosclerosis of the aorta and its branches. Significant calcified plaque within the bilateral common femoral arteries causes greater than 90% stenosis. No pathologic adenopathy within the abdomen or pelvis. Reproductive: Prostate is mildly enlarged measuring 5.8 x 4.0 by 4.8 cm. Asymmetric hypoattenuation of the peripheral zone of the right aspect of the prostate. Please correlate with physical exam findings and serum PSA. Other: No free fluid or free  gas.  No abdominal wall hernia. Musculoskeletal: No acute bony abnormalities. There is prominent spondylosis at the L3-4 level. Benign-appearing lucency within the right iliac crest with well-circumscribed margins. Reconstructed images demonstrate no additional findings. IMPRESSION: 1. Indeterminate hypodensity within the right lobe liver abutting the gallbladder fossa, concerning for neoplasm given intracranial findings. If tissue diagnosis is desired, image guided biopsy could be considered. Given the decreased conspicuity on delayed imaging, follow-up liver ultrasound with possible ultrasound-guided biopsy could be considered. Alternatively, dedicated liver MRI could be considered. 2. Enlarged prostate, with asymmetric decreased attenuation of the right peripheral zone of the prostate. Please correlate with serum PSA and physical exam findings. 3. Distended urinary bladder, which may reflect chronic bladder outlet obstruction given the enlarged prostate. 4. 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 5. No acute intrathoracic process. 6. Aortic Atherosclerosis (ICD10-I70.0). Severe atherosclerosis of the bilateral common femoral arteries with greater than 90% stenosis. Electronically Signed   By: Randa Ngo M.D.   On: 01/22/2021 17:26   MR Brain W Wo Contrast  Result Date: 02/02/2021 CLINICAL DATA:  70 year old male with left parietal lobe mass and cerebral edema diagnosed by CT last month in the setting of right extremity numbness. Evidence of liver metastases, status post ultrasound-guided needle biopsy revealing adenocarcinoma. Esophageal carcinoma suspected. Metastatic treatment planning. EXAM: MRI HEAD WITHOUT AND WITH CONTRAST TECHNIQUE: Multiplanar, multiecho pulse sequences of the brain and surrounding structures were obtained without and with intravenous contrast.  CONTRAST:  70m MULTIHANCE GADOBENATE DIMEGLUMINE 529 MG/ML IV SOLN COMPARISON:  Brain MRI 01/22/2021. FINDINGS: Brain: Mildly lobulated up to 21 mm diameter enhancing mass in the left parietal lobe has mildly spiculated margins and is stable and size and morphology from last month. Regional vasogenic edema has not significantly changed. Stable mild regional mass effect. A 2nd punctate enhancing lesion in  the medial left occipital lobe is more conspicuous on black blood postcontrast imaging today measuring 2-3 mm on series 11, image 64. This resemble vascular enhancement on the recent comparison but is most compatible with a punctate 2nd metastasis. No edema or mass effect. No other No abnormal enhancement identified. No suspicious dural thickening. No superimposed restricted diffusion to suggest acute infarction. No midline shift, ventriculomegaly, extra-axial collection or acute intracranial hemorrhage. Cervicomedullary junction and pituitary are within normal limits. No chronic cerebral blood products or cortical encephalomalacia identified. Scattered small mostly subcortical white matter nonspecific T2 and FLAIR hyperintensity is stable and mild for age. Vascular: Major intracranial vascular flow voids are stable with dominant right vertebral artery. Skull and upper cervical spine: Visualized bone marrow signal is within normal limits. Grossly negative visible cervical spine. Sinuses/Orbits: Negative. Other: Visible internal auditory structures appear normal. Negative visible scalp soft tissues. IMPRESSION: Positive for two brain metastases: - the dominant 21 mm left parietal lobe metastasis with stable regional edema and mild regional mass effect. - punctate enhancing metastasis in the medial left occipital lobe. Stable left parietal lobe metastasis with mild regional mass effect and vasogenic edema. Both lesions annotated on series 11. Electronically Signed   By: Genevie Ann M.D.   On: 02/02/2021 06:13   MR  Brain W and Wo Contrast  Result Date: 01/22/2021 CLINICAL DATA:  Right-sided weakness, brain mass on CT EXAM: MRI HEAD WITHOUT AND WITH CONTRAST TECHNIQUE: Multiplanar, multiecho pulse sequences of the brain and surrounding structures were obtained without and with intravenous contrast. CONTRAST:  23m GADAVIST GADOBUTROL 1 MMOL/ML IV SOLN COMPARISON:  Correlation made with same day CT head FINDINGS: Brain: There is a heterogeneously enhancing frontoparietal mass within the left centrum semiovale with extensive surrounding edema. Mass measures 1.6 x 1.6 x 1.9 cm. No associated reduced diffusion. Susceptibility is present reflecting intralesional blood products or mineralization. No additional mass or abnormal enhancement. Edema causes mild regional mass effect. Vascular: Major vessel flow voids at the skull base are preserved. Skull and upper cervical spine: Normal marrow signal is preserved. Sinuses/Orbits: Minor mucosal thickening.  Orbits are unremarkable. Other: Sella is partially empty. Minimal patchy mastoid fluid opacification. IMPRESSION: 1.9 cm mass within the left frontoparietal white matter with extensive surrounding edema. Favor metastatic disease over primary neoplasm. Electronically Signed   By: PMacy MisM.D.   On: 01/22/2021 16:39   CT ABDOMEN PELVIS W CONTRAST  Result Date: 01/22/2021 CLINICAL DATA:  Intracranial metastases without known primary malignancy EXAM: CT CHEST, ABDOMEN, AND PELVIS WITH CONTRAST TECHNIQUE: Multidetector CT imaging of the chest, abdomen and pelvis was performed following the standard protocol during bolus administration of intravenous contrast. CONTRAST:  1073mOMNIPAQUE IOHEXOL 300 MG/ML  SOLN COMPARISON:  11/03/2020 FINDINGS: CT CHEST FINDINGS Cardiovascular: The heart and great vessels are grossly unremarkable. There is trace pericardial fluid anteriorly. Moderate atherosclerosis of the aorta, great vessel origins, and coronary vasculature. Mediastinum/Nodes:  No enlarged mediastinal, hilar, or axillary lymph nodes. Thyroid gland, trachea, and esophagus demonstrate no significant findings. Small hiatal hernia. Lungs/Pleura: No acute airspace disease, effusion, or pneumothorax. No pulmonary nodules or masses. The central airways are widely patent. Musculoskeletal: No acute or destructive bony lesions. Reconstructed images demonstrate no additional findings. CT ABDOMEN PELVIS FINDINGS Hepatobiliary: 1.3 cm cyst is identified within the superior aspect right lobe liver image 52/3. There is a 1.5 cm indeterminate hypodensity within the right lobe liver adjacent to the gallbladder fossa, reference image 63/3, concerning for metastatic disease given intracranial findings. No  other focal liver abnormalities are observed. Gallbladder is unremarkable. 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 Lobular cystic structures seen in the region of the uncinate process of the pancreas, measuring 2.9 x 2.5 cm. This cystic mass demonstrates no internal nodularity or septations, most consistent with sequela of chronic pancreatitis and small pseudocyst. Spleen: Normal in size without focal abnormality. Adrenals/Urinary Tract: The kidneys enhance normally and symmetrically. The bladder is severely distended, which could be due to chronic bladder outlet obstruction. Mild thickening of the body of the left adrenal gland measuring 7 mm without focal abnormality. Right adrenal is normal. Stomach/Bowel: No bowel obstruction or ileus. The appendix, if still present, is not well visualized. No bowel wall thickening or inflammatory change. Vascular/Lymphatic: There is extensive atherosclerosis of the aorta and its branches. Significant calcified plaque within the bilateral common femoral arteries causes greater than 90% stenosis. No pathologic adenopathy within the abdomen or pelvis. Reproductive: Prostate is  mildly enlarged measuring 5.8 x 4.0 by 4.8 cm. Asymmetric hypoattenuation of the peripheral zone of the right aspect of the prostate. Please correlate with physical exam findings and serum PSA. Other: No free fluid or free gas.  No abdominal wall hernia. Musculoskeletal: No acute bony abnormalities. There is prominent spondylosis at the L3-4 level. Benign-appearing lucency within the right iliac crest with well-circumscribed margins. Reconstructed images demonstrate no additional findings. IMPRESSION: 1. Indeterminate hypodensity within the right lobe liver abutting the gallbladder fossa, concerning for neoplasm given intracranial findings. If tissue diagnosis is desired, image guided biopsy could be considered. Given the decreased conspicuity on delayed imaging, follow-up liver ultrasound with possible ultrasound-guided biopsy could be considered. Alternatively, dedicated liver MRI could be considered. 2. Enlarged prostate, with asymmetric decreased attenuation of the right peripheral zone of the prostate. Please correlate with serum PSA and physical exam findings. 3. Distended urinary bladder, which may reflect chronic bladder outlet obstruction given the enlarged prostate. 4. 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 5. No acute intrathoracic process. 6. Aortic Atherosclerosis (ICD10-I70.0). Severe atherosclerosis of the bilateral common femoral arteries with greater than 90% stenosis. Electronically Signed   By: Randa Ngo M.D.   On: 01/22/2021 17:26   MR LIVER W WO CONTRAST  Result Date: 01/24/2021 CLINICAL DATA:  History of liver lesion discovered on a is CT of the abdomen and pelvis. EXAM: MRI ABDOMEN WITHOUT AND WITH CONTRAST TECHNIQUE: Multiplanar multisequence MR imaging of the abdomen was performed both before and after the administration of intravenous contrast.  CONTRAST:  13m GADAVIST GADOBUTROL 1 MMOL/ML IV SOLN COMPARISON:  CT of the abdomen and pelvis of August 22, CT of the chest, abdomen and pelvis of January 22, 2021. FINDINGS: Lower chest: No sign of effusion or evidence of consolidative process at the lung bases. Limited assessment on MRI. Hepatobiliary: Signs of hepatic steatosis, mild. Lesion of concern in the area of the gallbladder fossa shows restricted diffusion and peripheral enhancement with washout. (Image 47/1) Multiple other foci of rim enhancement and restricted diffusion. (Image 38/71) 7 mm lesion in the lateral segment of the LEFT hepatic lobe. (Image 35/701 9 mm lesion in the RIGHT hepatic lobe, hepatic subsegment V/VIII. Small satellite lesions about the dominant lesion in hepatic subsegment IV/V. At least 8 additional lesions in the RIGHT hepatic lobe. Pancreas: Marked pancreatic atrophy. Cystic lesion in the head of the pancreas measuring  2.8 x 2.4 cm without signs of pancreatic ductal dilation or signs of inflammation. No nodular enhancement of this lesion. Spleen:  Normal spleen. Adrenals/Urinary Tract: Adrenal glands are normal. Symmetric renal enhancement. No hydronephrosis. No suspicious renal lesion. Stomach/Bowel: Gastroesophageal thickening with masslike appearance. Small lymph node adjacent to the GE junction. Gastroesophageal abnormality best seen on coronal image eighty of series 8. No acute gastrointestinal process to the extent evaluated on abdominal MRI. Vascular/Lymphatic: Patent abdominal vasculature though atherosclerotic narrowing of the origin of the SMA and atherosclerotic plaque of the abdominal aorta without aneurysmal dilation. Small lymph nodes adjacent to the distal esophagus suspicious based on other findings outlined above. No retroperitoneal adenopathy. No gastrohepatic lymphadenopathy. Portal vein is patent into the liver. Hepatic arterial supply arises in a classic fashion from the celiac. Other:  No ascites.  Musculoskeletal: No suspicious bone lesions identified. IMPRESSION: 1. 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 Endoscopic assessment may be helpful for further evaluation. Small lymph nodes in the area of the gastroesophageal junction also suspicious. 2. Hepatic steatosis. 3. Cystic lesion in the head of the pancreas without high-risk features of ductal dilation or mural nodularity. 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 Consider short interval follow-up at 6 month interval common approach that may be more reasonable in the current context. Based on size a more aggressive approach would be endoscopic ultrasound and sampling. 4. Atherosclerosis with some narrowing of the origin of the SMA. Aortic Atherosclerosis (ICD10-I70.0). Electronically Signed   By: Zetta Bills M.D.   On: 01/24/2021 08:37   US BIOPSY (LIVER)  Result Date: 01/24/2021 INDICATION: 70 year old male with indeterminate liver masses. EXAM: ULTRASOUND BIOPSY CORE LIVER MEDICATIONS: None. ANESTHESIA/SEDATION: Moderate (conscious) sedation was employed during this procedure. A total of Versed 1 mg and Fentanyl 50 mcg was administered intravenously. Moderate Sedation Time: 13 minutes. The patient's level of consciousness and vital signs were monitored continuously by radiology nursing throughout the procedure under my direct supervision. COMPLICATIONS: None immediate. PROCEDURE: Informed written consent was obtained from the patient after a thorough discussion of the procedural risks, benefits and alternatives. All questions were addressed. Maximal Sterile Barrier Technique was utilized including caps, mask, sterile gowns, sterile gloves, sterile drape, hand hygiene and skin antiseptic. A timeout was performed prior to the initiation of the procedure. Preprocedure ultrasound demonstrated safe window in the right  upper quadrant for focal liver biopsy of anterior segment 4 hypodense mass measuring approximately 19 mm in maximum dimension. The right upper quadrant was prepped and draped in standard fashion. Local anesthesia was administered subdermally at the planned entry site as well as under ultrasound guidance along the hepatic capsule. A skin nick was made. A 17 gauge introducer needle was advanced to the hepatic parenchyma under ultrasound guidance. Next, a total of 2, 18 gauge core biopsies were obtained. The samples were placed in formalin and sent to Pathology. Under ultrasound guidance, a Gel-Foam slurry was administered along the needle entry tract as the introducer needle was withdrawn. Postprocedure ultrasound demonstrated no evidence of perihepatic fluid collection. The patient tolerated the procedure well. IMPRESSION: Technically successful ultrasound-guided core liver mass biopsy from hepatic segment 4. Ruthann Cancer, MD [facility] Electronically Signed   By: Ruthann Cancer M.D.   On: 01/24/2021 11:11   EEG adult  Result Date: 01/23/2021 Lora Havens, MD     01/23/2021  4:08 PM Patient Name: Dalan Cowger MRN: [id number]  Epilepsy Attending: Lora Havens Referring Physician/Provider: Myra Rude, NP Date: 01/23/2021 Duration: 22.22 mins Patient history:  70 y.o. male with PMHx of CAD, HLD, DM2, COPD, HTN, OSA on CPAP, obesity, remote smoking history who presented to the ED 01/22/2021 for evaluation of right-sided weakness, numbness, and paresthesias. CTH was obtained revealing a left parietal mass with extensive vasogenic edema.  EEG to evaluate for seizures. Level of alertness: Awake, asleep AEDs during EEG study: GBP Technical aspects: This EEG study was done with scalp electrodes positioned according to the 10-20 International system of electrode placement. Electrical activity was acquired at a sampling rate of _0  and reviewed with a high  frequency filter of _1  and a low frequency filter of _2 . EEG data were recorded continuously and digitally stored. Description: The posterior dominant rhythm consists of 7.5 Hz activity of moderate voltage (25-35 uV) seen predominantly in posterior head regions, symmetric and reactive to eye opening and eye closing. Sleep was characterized by vertex waves, sleep spindles (12 to 14 Hz), maximal frontocentral region. EEG showed continuous generalized and maximal left temporoparietal 5-6 Hz theta slowing.  Intermittent left temporoparietal 2 to 3 Hz sharply contoured and rhythmic delta slowing was also noted.  Hyperventilation and photic stimulation were not performed.   ABNORMALITY - Continuous slow, generalized and maximal left temporoparietal region -Intermittent rhythmic slow, left temporoparietal region IMPRESSION: This study showed rhythmic delta activity in left temporoparietal region which is on the ictal-interictal continuum with low potential for seizures. There is also cortical dysfunction in left temporoparietal region likely secondary to underlying structural abnormality/ mass. Additionally there is mild diffuse encephalopathy, nonspecific etiology.  No seizures or definite epileptiform discharges were seen throughout the recording. Priyanka [name]    Labs:  CBC: Recent Labs    01/22/21 1219 01/23/21 0346 01/26/21 0030 02/02/21 1238  WBC 7.5 9.6 8.1 9.5  HGB 17.0 16.6 15.4 16.6  HCT 50.8 49.0 45.7 48.5  PLT 251 232 244 276    COAGS: Recent Labs    01/22/21 1219  INR 1.1  APTT 32    BMP: Recent Labs    04/11/20 0824 01/22/21 1219 01/23/21 0346 01/26/21 0030 02/02/21 1238  NA 136 136 135 137 140  K 4.9 4.0 4.0 4.2 4.2  CL 98 97* 99 101 104  CO2 _3 GLUCOSE 128* 126* 152* 119* 143*  BUN _4 24* 19  CALCIUM 9.6 9.6 9.2 8.7* 9.2  CREATININE 1.02 1.09 1.11 1.06 0.89  GFRNONAA 76 >60 >60 >60 >60  GFRAA 88  --   --   --   --     LIVER FUNCTION  TESTS: Recent Labs    04/11/20 0824 01/22/21 1219 01/26/21 0030 02/02/21 1238  BILITOT 0.7 1.3* 0.9 0.6  AST _5 ALT 21 26 36 147*  ALKPHOS  --  58 43 96  PROT 7.2 7.3 6.0* 6.8  ALBUMIN  --  4.4 3.6 3.9    Assessment and Plan:  Esophageal cancer Metastatic disease OK to proceed with port-a-cath as planned.  Risks and benefits of image guided port-a-catheter placement was discussed with the patient including, but not limited to bleeding, infection, pneumothorax, or fibrin sheath development and need for additional procedures.  All of the patient's questions were answered, patient is agreeable to proceed. Consent signed and in chart.   Thank you for this interesting consult.  I greatly enjoyed meeting Isael Stille and look forward to participating in their  care.  A copy of this report was sent to the requesting provider on this date.  Electronically Signed: Pasty Spillers, PA 02/12/2021, 11:32 AM   I spent a total of 30 Minutes  in face to face in clinical consultation, greater than 50% of which was counseling/coordinating care for image-guided insertion of port-a-cath.

## 2021-02-12 NOTE — Telephone Encounter (Signed)
Oral Oncology Patient Advocate Encounter  After completing a benefits investigation, prior authorization for Xeloda is not required at this time through [employer].  Patient's copay is $22.83.     Victor Patient [place] Phone [phone] Fax [phone] 02/12/2021 3:00 PM

## 2021-02-13 ENCOUNTER — Other Ambulatory Visit (HOSPITAL_COMMUNITY): Payer: Self-pay

## 2021-02-13 ENCOUNTER — Inpatient Hospital Stay (HOSPITAL_BASED_OUTPATIENT_CLINIC_OR_DEPARTMENT_OTHER): Payer: Medicare Other | Admitting: Hematology

## 2021-02-13 ENCOUNTER — Encounter: Payer: Self-pay | Admitting: Hematology

## 2021-02-13 ENCOUNTER — Inpatient Hospital Stay: Payer: Medicare Other

## 2021-02-13 VITALS — BP 141/77 | HR 66 | Temp 97.5°F | Resp 17 | Ht 74.0 in | Wt 239.0 lb

## 2021-02-13 DIAGNOSIS — C7931 Secondary malignant neoplasm of brain: Secondary | ICD-10-CM | POA: Diagnosis not present

## 2021-02-13 DIAGNOSIS — C155 Malignant neoplasm of lower third of esophagus: Secondary | ICD-10-CM | POA: Diagnosis not present

## 2021-02-13 DIAGNOSIS — Z5111 Encounter for antineoplastic chemotherapy: Secondary | ICD-10-CM | POA: Diagnosis not present

## 2021-02-13 DIAGNOSIS — R3912 Poor urinary stream: Secondary | ICD-10-CM | POA: Diagnosis not present

## 2021-02-13 DIAGNOSIS — E291 Testicular hypofunction: Secondary | ICD-10-CM | POA: Diagnosis not present

## 2021-02-13 DIAGNOSIS — N401 Enlarged prostate with lower urinary tract symptoms: Secondary | ICD-10-CM | POA: Diagnosis not present

## 2021-02-13 DIAGNOSIS — Z510 Encounter for antineoplastic radiation therapy: Secondary | ICD-10-CM | POA: Diagnosis not present

## 2021-02-13 DIAGNOSIS — R3914 Feeling of incomplete bladder emptying: Secondary | ICD-10-CM | POA: Diagnosis not present

## 2021-02-13 DIAGNOSIS — Z95828 Presence of other vascular implants and grafts: Secondary | ICD-10-CM

## 2021-02-13 DIAGNOSIS — R351 Nocturia: Secondary | ICD-10-CM | POA: Diagnosis not present

## 2021-02-13 LAB — CMP (CANCER CENTER ONLY)
ALT: 55 U/L — ABNORMAL HIGH (ref 0–44)
AST: 14 U/L — ABNORMAL LOW (ref 15–41)
Albumin: 3.8 g/dL (ref 3.5–5.0)
Alkaline Phosphatase: 83 U/L (ref 38–126)
Anion gap: 12 (ref 5–15)
BUN: 20 mg/dL (ref 8–23)
CO2: 24 mmol/L (ref 22–32)
Calcium: 8.8 mg/dL — ABNORMAL LOW (ref 8.9–10.3)
Chloride: 103 mmol/L (ref 98–111)
Creatinine: 0.91 mg/dL (ref 0.61–1.24)
GFR, Estimated: >60 mL/min (ref >60)
Glucose, Bld: 181 mg/dL — ABNORMAL HIGH (ref 70–99)
Potassium: 4.4 mmol/L (ref 3.5–5.1)
Sodium: 139 mmol/L (ref 135–145)
Total Bilirubin: 0.8 mg/dL (ref 0.3–1.2)
Total Protein: 6.2 g/dL — ABNORMAL LOW (ref 6.5–8.1)

## 2021-02-13 LAB — CBC WITH DIFFERENTIAL (CANCER CENTER ONLY)
Abs Immature Granulocytes: 0.16 10*3/uL — ABNORMAL HIGH (ref 0.00–0.07)
Basophils Absolute: 0 10*3/uL (ref 0.0–0.1)
Basophils Relative: 0 %
Eosinophils Absolute: 0 10*3/uL (ref 0.0–0.5)
Eosinophils Relative: 0 %
HCT: 46.6 % (ref 39.0–52.0)
Hemoglobin: 15.8 g/dL (ref 13.0–17.0)
Immature Granulocytes: 2 %
Lymphocytes Relative: 7 %
Lymphs Abs: 0.7 10*3/uL (ref 0.7–4.0)
MCH: 31.7 pg (ref 26.0–34.0)
MCHC: 33.9 g/dL (ref 30.0–36.0)
MCV: 93.4 fL (ref 80.0–100.0)
Monocytes Absolute: 0.4 10*3/uL (ref 0.1–1.0)
Monocytes Relative: 4 %
Neutro Abs: 8.3 10*3/uL — ABNORMAL HIGH (ref 1.7–7.7)
Neutrophils Relative %: 87 %
Platelet Count: 204 10*3/uL (ref 150–400)
RBC: 4.99 MIL/uL (ref 4.22–5.81)
RDW: 15.4 % (ref 11.5–15.5)
WBC Count: 9.6 10*3/uL (ref 4.0–10.5)
nRBC: 0 % (ref 0.0–0.2)

## 2021-02-13 LAB — SURGICAL PATHOLOGY

## 2021-02-13 MED ORDER — SODIUM CHLORIDE 0.9% FLUSH
10.0000 mL | Freq: Once | INTRAVENOUS | Status: AC
  Administered 2021-02-13: 10 mL

## 2021-02-13 MED ORDER — HEPARIN SOD (PORK) LOCK FLUSH 100 UNIT/ML IV SOLN
500.0000 [IU] | Freq: Once | INTRAVENOUS | Status: AC
  Administered 2021-02-13: 500 [IU]

## 2021-02-13 NOTE — Progress Notes (Signed)
Pt had a bandage from home he placed over port and the port is brand new and still has glue over the incision line, some of the glue was stuck to the bandage and caused a small area to bleed minimally. MD Burr Medico Assessed pt's incision and gave the okay to access the port and that she would discuss with surgeon about adding more glue to the incision.

## 2021-02-13 NOTE — Telephone Encounter (Signed)
Oral Chemotherapy Pharmacist Encounter  I spoke with patient for overview of: Xeloda (capecitabine) for the treatment of metastatic esophageal cancer in conjunction with oxaliplatin, trastuzumab, and pembrolizumab, planned duration until disease progression or unacceptable drug toxicity.  Counseled patient on administration, dosing, side effects, monitoring, drug-food interactions, safe handling, storage, and disposal.  Patient will take Xeloda '500mg'$  tablets, 4 tablets ('2000mg'$ ) by mouth in AM and 4 tabs ('2000mg'$ ) by mouth in PM, within 30 minutes of finishing meals, on days 1-14 of each 21 day cycle.   Xeloda start date: 02/14/21  Adverse effects include but are not limited to: fatigue, decreased blood counts, GI upset, diarrhea, mouth sores, and hand-foot syndrome.  Patient has anti-emetic on hand and knows to take it if nausea develops.   Patient will obtain anti diarrheal and alert the office of 4 or more loose stools above baseline.  Reviewed with patient importance of keeping a medication schedule and plan for any missed doses. No barriers to medication adherence identified.  Medication reconciliation performed and medication/allergy list updated. Patient agreeable to discontinuing pantoprazole while on Xeloda due to risk of decreased efficacy of Xeloda. Patient will take famotidine 20 mg BID while on Xeloda. He also knows he may use Tums PRN for break through heartburn/indigestion.   Insurance authorization for Xeloda has been obtained. Patient will pick this up from the [facility] on 02/13/21.  Patient informed the pharmacy will reach out 5-7 days prior to needing next fill of Xeloda to coordinate continued medication acquisition to prevent break in therapy.  All questions answered.  Mr. Jopp voiced understanding and appreciation.   Medication education handout and medication calendar given to patient. Patient knows to call the office with questions or concerns.  Oral [facility]otherapy Clinic phone number provided to patient.   Leron Croak, PharmD, BCPS Hematology/Oncology Clinical Pharmacist [facility] [phone] 02/13/2021 11:48 AM

## 2021-02-13 NOTE — Progress Notes (Signed)
I met with Mr and Mrs Seder after his appt with Dr Burr Medico.  I reviewed his new treatment medications, how they are administered, the class of drug, and the most common side effects.  I provided written material on : Oxaliplatin, Xeloda, Keytruda, herceptin, nausea, diarrhea, cold sensitivity, and hand and foot syndrome.  I let him know that he needs to have an echocardiogram before he starts herceptin. I provided the appt location, date, and time.  All questions were answered.  They verbalized understanding.

## 2021-02-13 NOTE — Progress Notes (Signed)
[place]   [phone] [phone]   Clinic Follow up Note   Patient Care Team: [name], MD as PCP - General (Family Medicine) Pa, [facility] Leonie Man, MD as Consulting Physician (Cardiology) Lucas Mallow, MD as Consulting Physician (Urology) Laroy Apple, MD as Referring Physician (Physical Medicine and Rehabilitation) Bettina Gavia Hilton Cork, MD as Consulting Physician (Cardiology) Marshell Garfinkel, MD as Consulting Physician (Pulmonary Disease) Truitt Merle, MD as Consulting Physician (Oncology) Eppie Gibson, MD as Attending Physician (Radiation Oncology) Royston Bake, RN as Oncology Nurse Navigator (Oncology)  Date of Service:  02/13/2021  CHIEF COMPLAINT: f/u of metastatic esophageal cancer  CURRENT THERAPY:  Pending first line chemo CAPOX, trastuzumab and Keytruda every 3 weeks  ASSESSMENT & PLAN:  Jason Snyder is a 70 y.o. male with   1. 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  -The patient's upper endoscopy as well as CT scans and MRI show medium-sized, ulcerating, non-obstructing, not circumferential mass in distal esophagus.  He has disease to the liver as well. -liver biopsy on 01/24/21 showed adenocarcinoma, compatible with primary cholangiocarcinoma in absence of any other malignancies. -esophageal biopsy from 01/26/21 also showed adenocarcinoma, consistent with esophageal primary.   -Her2 was requested on both biopsy samples-- the esophagus was HER2+ (by FISH, 2+), and the liver was HER2- (0). This raised the question if he has two primaries.  -I recommend first line chemo CAPOX, with trastuzumab and pembrolizumab every 3  weeks  -port placed 02/12/21 -He is scheduled to begin CAPOX and pembrolizumab tomorrow, 02/14/21.  -He has not had echo yet, will postpone trastuzumab or to cycle 2  --Chemotherapy consent: Side effects including but does not not limited to, fatigue, nausea, vomiting, diarrhea, hair loss, cold sensitivity and neuropathy, fluid retention, renal and kidney dysfunction, neutropenic fever, needed for blood transfusion, bleeding, reverse cardiomyopathy, skin rash, pneumonitis, colitis, thyroid dysfunction, other endocrine and autoimmune related disorders, were discussed with patient in great detail. He agrees to proceed. -The goal of therapy is palliative, to prolong his life, and improve his quality of life -chest CT scheduled for 02/20/21, echo scheduled for 02/22/21   2. Metastatic disease to the brain -He presented with right arm and right leg weakness.   -Brain MRI which showed a 1.9 cm mass in the left frontoparietal white matter with extensive surrounding edema. Another punctate left occipital metastasis was seen on the 3 Tesla MRI.  -Overall, symptoms improving since starting Decadron.  Currently taking 2 mg twice daily. -He underwent SRS to the metastatic brain lesions on 02/09/21 under the care of Dr. Isidore Moos.   3. GERD -The patient has a longstanding history of GERD. -He is currently taking pantoproazole 40 mg BID -Encouraged for him to continue taking this as he needs PPI for current steroid use and longstanding GERD   4. Comorbidities (COPD, DM) -COPD controlled with inhalers. Patient quit smoking in 2018. Never required hospitalizations for COPD -DM also under good control per patient. Follows closely with PCP. On gabapentin for peripheral neuropathy.  -plan to start finasteride under urology    PLAN: -proceed with C1 CAPOX with [place] tomorrow, 02/14/21, he will pick up Xeloda today  -phone visit in a week at 12:40 -  chest CT 02/20/21 -echo 02/22/21 -labs, flush, f/u, and C2 on  03/08/21, plan to start trastuzumab from cycle 2   No problem-specific Assessment & Plan notes found for this encounter.   SUMMARY OF ONCOLOGIC HISTORY: Oncology History  Esophageal cancer ([place])  01/22/2021 Imaging   CT of the Head  IMPRESSION: Left parietal mass with extensive vasogenic edema most concerning for a metastasis. Brain MRI without and with contrast is recommended for further evaluation.     01/22/2021 Imaging   CT CHEST, ABDOMEN, AND PELVIS  IMPRESSION: 1. Indeterminate hypodensity within the right lobe liver abutting the gallbladder fossa, concerning for neoplasm given intracranial findings. If tissue diagnosis is desired, image guided biopsy could be considered. Given the decreased conspicuity on delayed imaging, follow-up liver ultrasound with possible ultrasound-guided biopsy could be considered. Alternatively, dedicated liver MRI could be considered. 2. Enlarged prostate, with asymmetric decreased attenuation of the right peripheral zone of the prostate. Please correlate with serum PSA and physical exam findings. 3. Distended urinary bladder, which may reflect chronic bladder outlet obstruction given the enlarged prostate. 4. 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 5. No acute intrathoracic process. 6. Aortic Atherosclerosis (ICD10-I70.0). Severe atherosclerosis of the bilateral common femoral arteries with greater than 90% stenosis.    01/22/2021 Imaging   BRAIN MRI W/WO CONTRAST   IMPRESSION: 1.9 cm mass within the left frontoparietal white matter with extensive surrounding edema. Favor metastatic disease over primary neoplasm.   01/26/2021 Pathology Results   CASE: [id number] A. ESOPHAGUS, DISTAL, BIOPSY:  - Adenocarcinoma, see comment.   COMMENT:   The morphology is most consistent with primary esophageal   adenocarcinoma. Dr. Vic Ripper has reviewed the case.   01/26/2021 Procedure   Findings: A small hiatal hernia was present. The entire examined stomach was normal. The examined duodenum was normal. - Likely malignant esophageal tumor was found in the distal esophagus. Biopsied. - Small hiatal hernia. - Normal stomach.   01/26/2021 Cancer Staging   Staging form: Esophagus - Adenocarcinoma, AJCC 8th Edition - Clinical stage from 01/26/2021: Stage IVB (cTX, cNX, pM1) - Signed by Truitt Merle, MD on 02/02/2021 Stage prefix: Initial diagnosis   02/02/2021 Initial Diagnosis   Esophageal cancer ([place])   02/16/2021 - 02/16/2021 Chemotherapy      Patient is on Antibody Plan: HEAD/NECK PEMBROLIZUMAB Q21D     02/16/2021 -  Chemotherapy    Patient is on Treatment Plan: GASTRIC CAPEOX (1000/130) Q21D X 8 CYCLES    Patient is on Antibody Plan: HEAD/NECK PEMBROLIZUMAB Q21D     02/20/2021 -  Chemotherapy    Patient is on Treatment Plan: GASTROESOPHAGEAL FOLFOX Q14D X 6 CYCLES   Patient is on Antibody Plan: HEAD/NECK PEMBROLIZUMAB Q21D        INTERVAL HISTORY:  Zyquan Crotty is here for a follow up of metastatic esophageal cancer. He was last seen by me on 02/02/21 in consultation with PA Cassie. He presents to the clinic accompanied by his wife.   All other systems were reviewed with the patient and are negative.  MEDICAL HISTORY:  Past Medical History:  Diagnosis Date   BMI 30.0-30.9,adult    BPH without obstruction/lower urinary tract symptoms 10/03/2017   CAD (coronary artery disease)    cath 2020, single vessel disease, medical mgt   Chronic left shoulder pain c   Chronic low back pain with right-sided sciatica    Chronic pain 10/07/2013  Combined hyperlipidemia associated with type 2 diabetes mellitus ([place]) 07/09/2019   Controlled type 2 diabetes mellitus without complication, without long-term current use of insulin ([place]) 07/12/2014   COPD (chronic obstructive pulmonary disease) ([place])  01/07/2014   PFTs 07/2018 minimal airway obstruction, overinflation and no response to bronchodilators.    Coronary artery disease involving native coronary artery of native heart without angina pectoris    Cardiac Cath 01/2019: Severe single-vessel disease with additional moderate two-vessel disease: 100% CTO of RCA, moderate disease in proximal LCx, distal LCx and very small caliber 1st Diag DFR-FFR negative lesion in proximal LCx Normal LV pressures with previously document, ed normal EF.   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 On testosterone replacement   Obesity (BMI 30-39.9)    Obesity (BMI 30.0-34.9) 10/03/2017   OSA on CPAP 10/07/2013   Primary osteoarthritis involving multiple joints 01/10/2015   Rosacea    Smoking greater than 30 pack years - quit 03/2018 10/03/2017   Thoracic and lumbosacral neuritis    Type 2 diabetes mellitus with peripheral neuropathy ([place]) 07/12/2014    SURGICAL HISTORY: Past Surgical History:  Procedure Laterality Date   ARTHROSCOPIC REPAIR ACL     BIOPSY  01/26/2021   Procedure: BIOPSY;  Surgeon: Thornton Park, MD;  Location: [facility];  Service: Gastroenterology;;   CARDIAC CATHETERIZATION     ESOPHAGOGASTRODUODENOSCOPY (EGD) WITH PROPOFOL N/A 01/26/2021   Procedure: ESOPHAGOGASTRODUODENOSCOPY (EGD) WITH PROPOFOL;  Surgeon: Thornton Park, MD;  Location: [facility];  Service: Gastroenterology;  Laterality: N/A;   INTRAVASCULAR PRESSURE WIRE/FFR STUDY N/A  01/01/2019   Procedure: INTRAVASCULAR PRESSURE WIRE/FFR STUDY;  Surgeon: Leonie Man, MD;  Location: [facility];  Service: Cardiovascular;  Laterality: N/A;   IR IMAGING GUIDED PORT INSERTION  02/12/2021   LEFT HEART CATH AND CORONARY ANGIOGRAPHY N/A 01/01/2019   Procedure: LEFT HEART CATH AND CORONARY ANGIOGRAPHY;  Surgeon: Leonie Man, MD;  Location: [facility];  Service: Cardiovascular;  Laterality: N/A;   PROSTATE CRYOABLATION     03/2019   SHOULDER ARTHROSCOPY     SPINE SURGERY      I have reviewed the social history and family history with the patient and they are unchanged from previous note.  ALLERGIES:  is allergic to rubus fruticosus.  MEDICATIONS:  Current Outpatient Medications  Medication Sig Dispense Refill   Accu-Chek Softclix Lancets lancets Use as directed up to 4 times daily 100 each 5   acetaminophen-codeine (TYLENOL #3) 300-30 MG tablet Take 1 tablet by mouth 2 (two) times daily as needed for moderate pain. 90 tablet 0   acetic acid 2 % otic solution Place 4 drops into both ears 2 (two) times daily as needed. (Patient taking differently: Place 4 drops into both ears 2 (two) times daily as needed (pain).) 15 mL 2   albuterol (VENTOLIN HFA) 108 (90 Base) MCG/ACT inhaler Inhale 2 puffs by mouth into the lungs every 4 hours as needed for wheezing 18 g 0   amLODipine (NORVASC) 10 MG  tablet Take 1 tablet (10 mg total) by mouth daily. 90 tablet 0   aspirin 81 MG EC tablet Take 81 mg by mouth daily.      atorvastatin (LIPITOR) 20 MG tablet Take 1 tablet (20 mg total) by mouth at bedtime. 90 tablet 3   Blood Glucose Monitoring Suppl (BLOOD GLUCOSE MONITOR SYSTEM) w/Device KIT Please check blood glucose three times daily (before meals in the morning and 2 hours after largest meals of the day OR lunch and dinner) 1 kit 0   capecitabine (XELODA) 500 MG tablet Take 4 tablets (2,000 mg total) by mouth 2 (two) times daily. Take 14 days on, 7 days off, repeat  every 21 days. 112 tablet 0   cyclobenzaprine (FLEXERIL) 10 MG tablet Take 1 tablet (10 mg total) by mouth 3 (three) times daily as needed for muscle spasms. 90 tablet 3   dexamethasone (DECADRON) 2 MG tablet Take 1 tablet (2 mg total) by mouth every 12 (twelve) hours. Taper to 20m daily on 9/30. Taper to 298mevery other day on 10/21. Last dose: 11/1. 40 tablet 1   diclofenac sodium (VOLTAREN) 1 % GEL Apply 2 g topically 4 (four) times daily as needed (pain).     FARXIGA 10 MG TABS tablet TAKE ONE TABLET BY MOUTH ONE TIME DAILY (Patient taking differently: Take 10 mg by mouth daily.) 90 tablet 3   fluticasone (FLONASE) 50 MCG/ACT nasal spray Place 1 spray into the nose daily as needed for allergies.      gabapentin (NEURONTIN) 300 MG capsule Take 1 capsule (300 mg total) by mouth 3 (three) times daily. 180 capsule 3   glucose blood (ACCU-CHEK GUIDE) test strip Use as instructed up to 4 times daily 100 each 12   Ketotifen Fumarate (ALLERGY EYE DROPS OP) Place 1 drop into both eyes daily as needed (allergy).     lidocaine-prilocaine (EMLA) cream Apply 1 application topically as needed. 30 g 2   lidocaine-prilocaine (EMLA) cream Apply to affected area once 30 g 3   Loratadine 10 MG CAPS Take 10 mg by mouth daily as needed (allergies).      metFORMIN (GLUCOPHAGE) 500 MG tablet Take 1 tablet (500 mg total) by mouth 2 (two) times daily with a meal. 180 tablet 0   metoprolol tartrate (LOPRESSOR) 50 MG tablet Take 1 tablet (50 mg total) by mouth 2 (two) times daily. 180 tablet 3   Multiple Vitamin (MULTIVITAMIN) capsule Take 1 capsule by mouth daily.      nitroGLYCERIN (NITROSTAT) 0.4 MG SL tablet Place 1 tablet (0.4 mg total) under the tongue every 5 (five) minutes as needed for chest pain. 25 tablet 0   ondansetron (ZOFRAN) 8 MG tablet Take 1 tablet (8 mg total) by mouth every 8 (eight) hours as needed for nausea or vomiting. 30 tablet 2   ondansetron (ZOFRAN) 8 MG tablet Take 1 tablet (8 mg total) by  mouth 2 (two) times daily as needed for refractory nausea / vomiting. Start on day 3 after chemotherapy. 30 tablet 1   prochlorperazine (COMPAZINE) 10 MG tablet Take 1 tablet (10 mg total) by mouth every 6 (six) hours as needed. 30 tablet 2   prochlorperazine (COMPAZINE) 10 MG tablet Take 1 tablet (10 mg total) by mouth every 6 (six) hours as needed (Nausea or vomiting). 30 tablet 1   Spacer/Aero-Holding Chambers (AEROCHAMBER MV) inhaler Use as instructed 1 each 0   tamsulosin (FLOMAX) 0.4 MG CAPS capsule TAKE TWO CAPSULES BY MOUTH DAILY (Patient  taking differently: Take 0.4 mg by mouth daily.) 180 capsule 0   Tiotropium Bromide-Olodaterol (STIOLTO RESPIMAT) 2.5-2.5 MCG/ACT AERS Inhale 2 puffs into the lungs daily. 4 g 11   No current facility-administered medications for this visit.    PHYSICAL EXAMINATION: ECOG PERFORMANCE STATUS: 2 - Symptomatic, <50% confined to bed  Vitals:   02/13/21 1102  BP: (!) 141/77  Pulse: 66  Resp: 17  Temp: (!) 97.5 F (36.4 C)  SpO2: 96%   Wt Readings from Last 3 Encounters:  02/13/21 239 lb (108.4 kg)  02/02/21 238 lb 8 oz (108.2 kg)  01/31/21 238 lb (108 kg)     GENERAL:alert, no distress and comfortable SKIN: skin color normal, no rashes or significant lesions EYES: normal, Conjunctiva are pink and non-injected, sclera clear  NEURO: alert & oriented x 3 with fluent speech  LABORATORY DATA:  I have reviewed the data as listed CBC Latest Ref Rng & Units 02/13/2021 02/02/2021 01/26/2021  WBC 4.0 - 10.5 K/uL 9.6 9.5 8.1  Hemoglobin 13.0 - 17.0 g/dL 15.8 16.6 15.4  Hematocrit 39.0 - 52.0 % 46.6 48.5 45.7  Platelets 150 - 400 K/uL 204 276 244     CMP Latest Ref Rng & Units 02/13/2021 02/02/2021 01/26/2021  Glucose 70 - 99 mg/dL 181(H) 143(H) 119(H)  BUN 8 - 23 mg/dL 20 19 24(H)  Creatinine 0.61 - 1.24 mg/dL 0.91 0.89 1.06  Sodium 135 - 145 mmol/L 139 140 137  Potassium 3.5 - 5.1 mmol/L 4.4 4.2 4.2  Chloride 98 - 111 mmol/L 103 104 101  CO2 22 -  32 mmol/L _0 Calcium 8.9 - 10.3 mg/dL 8.8(L) 9.2 8.7(L)  Total Protein 6.5 - 8.1 g/dL 6.2(L) 6.8 6.0(L)  Total Bilirubin 0.3 - 1.2 mg/dL 0.8 0.6 0.9  Alkaline Phos 38 - 126 U/L 83 96 43  AST 15 - 41 U/L 14(L) 23 24  ALT 0 - 44 U/L 55(H) 147(H) 36      RADIOGRAPHIC STUDIES: I have personally reviewed the radiological images as listed and agreed with the findings in the report. IR IMAGING GUIDED PORT INSERTION  Result Date: 02/12/2021 INDICATION: 70 year old with metastatic esophageal cancer. Port-A-Cath needed for treatment. EXAM: FLUOROSCOPIC AND ULTRASOUND GUIDED PLACEMENT OF A SUBCUTANEOUS PORT COMPARISON:  None. MEDICATIONS: Moderate sedation ANESTHESIA/SEDATION: Versed 2.0 mg IV; Fentanyl 100 mcg IV; Moderate Sedation Time:  28 The patient was continuously monitored during the procedure by the interventional radiology nurse under my direct supervision. FLUOROSCOPY TIME:  42 seconds, 5 mGy COMPLICATIONS: None immediate. PROCEDURE: The procedure, risks, benefits, and alternatives were explained to the patient. Questions regarding the procedure were encouraged and answered. The patient understands and consents to the procedure. Patient was placed supine on the interventional table. Ultrasound confirmed a patent right internal jugular vein. Ultrasound image was saved for documentation. The right chest and neck were cleaned with a skin antiseptic and a sterile drape was placed. Maximal barrier sterile technique was utilized including caps, mask, sterile gowns, sterile gloves, sterile drape, hand hygiene and skin antiseptic. The right neck was anesthetized with 1% lidocaine. Small incision was made in the right neck with a blade. Micropuncture set was placed in the right internal jugular vein with ultrasound guidance. The micropuncture wire was used for measurement purposes. The right chest was anesthetized with 1% lidocaine with epinephrine. #15 blade was used to make an incision and a  subcutaneous port pocket was formed. [place] was assembled. Subcutaneous tunnel was formed with a  stiff tunneling device. The port catheter was brought through the subcutaneous tunnel. The port was placed in the subcutaneous pocket. The micropuncture set was exchanged for a peel-away sheath. The catheter was placed through the peel-away sheath and the tip was positioned at the superior cavoatrial junction. Catheter placement was confirmed with fluoroscopy. The port was accessed and flushed with heparinized saline. The port pocket was closed using two layers of absorbable sutures and Dermabond. The vein skin site was closed using a single layer of absorbable suture and Dermabond. Sterile dressings were applied. Patient tolerated the procedure well without an immediate complication. Ultrasound and fluoroscopic images were taken and saved for this procedure. IMPRESSION: Placement of a subcutaneous power-injectable port device. Catheter tip at the superior cavoatrial junction. Electronically Signed   By: Markus Daft M.D.   On: 02/12/2021 18:17      No orders of the defined types were placed in this encounter.  All questions were answered. The patient knows to call the clinic with any problems, questions or concerns. No barriers to learning was detected. The total time spent in the appointment was 40 minutes.     Truitt Merle, MD 02/13/2021   I, Wilburn Mylar, am acting as scribe for Truitt Merle, MD.   I have reviewed the above documentation for accuracy and completeness, and I agree with the above.

## 2021-02-14 ENCOUNTER — Other Ambulatory Visit: Payer: Self-pay

## 2021-02-14 ENCOUNTER — Inpatient Hospital Stay: Payer: Medicare Other

## 2021-02-14 ENCOUNTER — Other Ambulatory Visit: Payer: Self-pay | Admitting: Hematology

## 2021-02-14 ENCOUNTER — Telehealth: Payer: Self-pay | Admitting: Hematology

## 2021-02-14 VITALS — BP 147/93 | HR 76 | Temp 97.5°F | Resp 17

## 2021-02-14 DIAGNOSIS — C155 Malignant neoplasm of lower third of esophagus: Secondary | ICD-10-CM | POA: Diagnosis not present

## 2021-02-14 DIAGNOSIS — C7931 Secondary malignant neoplasm of brain: Secondary | ICD-10-CM | POA: Diagnosis not present

## 2021-02-14 DIAGNOSIS — Z510 Encounter for antineoplastic radiation therapy: Secondary | ICD-10-CM | POA: Diagnosis not present

## 2021-02-14 DIAGNOSIS — Z5111 Encounter for antineoplastic chemotherapy: Secondary | ICD-10-CM | POA: Diagnosis not present

## 2021-02-14 DIAGNOSIS — Z20822 Contact with and (suspected) exposure to covid-19: Secondary | ICD-10-CM | POA: Diagnosis not present

## 2021-02-14 LAB — CEA (IN HOUSE-CHCC): CEA (CHCC-In House): 2.17 ng/mL (ref 0.00–5.00)

## 2021-02-14 MED ORDER — PALONOSETRON HCL INJECTION 0.25 MG/5ML
0.2500 mg | Freq: Once | INTRAVENOUS | Status: AC
  Administered 2021-02-14: 0.25 mg via INTRAVENOUS
  Filled 2021-02-14: qty 5

## 2021-02-14 MED ORDER — OXALIPLATIN CHEMO INJECTION 100 MG/20ML
127.0000 mg/m2 | Freq: Once | INTRAVENOUS | Status: AC
  Administered 2021-02-14: 300 mg via INTRAVENOUS
  Filled 2021-02-14: qty 60

## 2021-02-14 MED ORDER — DEXTROSE 5 % IV SOLN: Freq: Once | INTRAVENOUS | Status: AC

## 2021-02-14 MED ORDER — HEPARIN SOD (PORK) LOCK FLUSH 100 UNIT/ML IV SOLN
500.0000 [IU] | Freq: Once | INTRAVENOUS | Status: AC | PRN
  Administered 2021-02-14: 500 [IU]

## 2021-02-14 MED ORDER — SODIUM CHLORIDE 0.9% FLUSH
10.0000 mL | INTRAVENOUS | Status: DC | PRN
  Administered 2021-02-14: 10 mL

## 2021-02-14 MED ORDER — SODIUM CHLORIDE 0.9 % IV SOLN
10.0000 mg | Freq: Once | INTRAVENOUS | Status: AC
  Administered 2021-02-14: 10 mg via INTRAVENOUS
  Filled 2021-02-14: qty 10

## 2021-02-14 NOTE — Progress Notes (Signed)
Keytruda to begin with cycle 3 per Dr. Burr Medico due to Dexamethasone schedule and taper.   Raul Del [place], [place], BCPS, BCOP 02/14/2021 11:37 AM

## 2021-02-14 NOTE — Progress Notes (Signed)
The proposed treatment discussed in conference is for discussion purpose only and is not a binding recommendation.  The patients have not been physically examined, or presented with their treatment options.  Therefore, final treatment plans cannot be decided.  

## 2021-02-14 NOTE — Patient Instructions (Signed)
[facility]  Discharge Instructions: Thank you for choosing [facility] to provide your oncology and hematology care.   If you have a lab appointment with the [facility], please go directly to the [facility] and check in at the registration area.   Wear comfortable clothing and clothing appropriate for easy access to any Portacath or PICC line.   We strive to give you quality time with your provider. You may need to reschedule your appointment if you arrive late (15 or more minutes).  Arriving late affects you and other patients whose appointments are after yours.  Also, if you miss three or more appointments without notifying the office, you may be dismissed from the clinic at the provider's discretion.      For prescription refill requests, have your pharmacy contact our office and allow 72 hours for refills to be completed.    Today you received the following chemotherapy and/or immunotherapy agents : Oxaliplatin      To help prevent nausea and vomiting after your treatment, we encourage you to take your nausea medication as directed.  BELOW ARE SYMPTOMS THAT SHOULD BE REPORTED IMMEDIATELY: *FEVER GREATER THAN 100.4 F (38 C) OR HIGHER *CHILLS OR SWEATING *NAUSEA AND VOMITING THAT IS NOT CONTROLLED WITH YOUR NAUSEA MEDICATION *UNUSUAL SHORTNESS OF BREATH *UNUSUAL BRUISING OR BLEEDING *URINARY PROBLEMS (pain or burning when urinating, or frequent urination) *BOWEL PROBLEMS (unusual diarrhea, constipation, pain near the anus) TENDERNESS IN MOUTH AND THROAT WITH OR WITHOUT PRESENCE OF ULCERS (sore throat, sores in mouth, or a toothache) UNUSUAL RASH, SWELLING OR PAIN  UNUSUAL VAGINAL DISCHARGE OR ITCHING   Items with * indicate a potential emergency and should be followed up as soon as possible or go to the Emergency Department if any problems should occur.  Please show the CHEMOTHERAPY ALERT CARD or IMMUNOTHERAPY ALERT CARD at check-in  to the Emergency Department and triage nurse.  Should you have questions after your visit or need to cancel or reschedule your appointment, please contact [facility]  Dept: [phone]  and follow the prompts.  Office hours are 8:00 a.m. to 4:30 p.m. Monday - Friday. Please note that voicemails left after 4:00 p.m. may not be returned until the following business day.  We are closed weekends and major holidays. You have access to a nurse at all times for urgent questions. Please call the main number to the clinic Dept: [phone] and follow the prompts.  Oxaliplatin Injection What is this medication? OXALIPLATIN (ox AL i PLA tin) is a chemotherapy drug. It targets fast dividing cells, like cancer cells, and causes these cells to die. This medicine is used to treat cancers of the colon and rectum, and many other cancers. This medicine may be used for other purposes; ask your health care provider or pharmacist if you have questions. COMMON BRAND NAME(S): Eloxatin What should I tell my care team before I take this medication? They need to know if you have any of these conditions: heart disease history of irregular heartbeat liver disease low blood counts, like white cells, platelets, or red blood cells lung or breathing disease, like asthma take medicines that treat or prevent blood clots tingling of the fingers or toes, or other nerve disorder an unusual or allergic reaction to oxaliplatin, other chemotherapy, other medicines, foods, dyes, or preservatives pregnant or trying to get pregnant breast-feeding How should I use this medication? This drug is given as an infusion into a  vein. It is administered in a hospital or clinic by a specially trained health care professional. Talk to your pediatrician regarding the use of this medicine in children. Special care may be needed. Overdosage: If you think you have taken too much of this medicine contact a poison  control center or emergency room at once. NOTE: This medicine is only for you. Do not share this medicine with others. What if I miss a dose? It is important not to miss a dose. Call your doctor or health care professional if you are unable to keep an appointment. What may interact with this medication? Do not take this medicine with any of the following medications: cisapride dronedarone pimozide thioridazine This medicine may also interact with the following medications: aspirin and aspirin-like medicines certain medicines that treat or prevent blood clots like warfarin, apixaban, dabigatran, and rivaroxaban cisplatin cyclosporine diuretics medicines for infection like acyclovir, adefovir, amphotericin B, bacitracin, cidofovir, foscarnet, ganciclovir, gentamicin, pentamidine, vancomycin NSAIDs, medicines for pain and inflammation, like ibuprofen or naproxen other medicines that prolong the QT interval (an abnormal heart rhythm) pamidronate zoledronic acid This list may not describe all possible interactions. Give your health care provider a list of all the medicines, herbs, non-prescription drugs, or dietary supplements you use. Also tell them if you smoke, drink alcohol, or use illegal drugs. Some items may interact with your medicine. What should I watch for while using this medication? Your condition will be monitored carefully while you are receiving this medicine. You may need blood work done while you are taking this medicine. This medicine may make you feel generally unwell. This is not uncommon as chemotherapy can affect healthy cells as well as cancer cells. Report any side effects. Continue your course of treatment even though you feel ill unless your healthcare professional tells you to stop. This medicine can make you more sensitive to cold. Do not drink cold drinks or use ice. Cover exposed skin before coming in contact with cold temperatures or cold objects. When out in cold  weather wear warm clothing and cover your mouth and nose to warm the air that goes into your lungs. Tell your doctor if you get sensitive to the cold. Do not become pregnant while taking this medicine or for 9 months after stopping it. Women should inform their health care professional if they wish to become pregnant or think they might be pregnant. Men should not father a child while taking this medicine and for 6 months after stopping it. There is potential for serious side effects to an unborn child. Talk to your health care professional for more information. Do not breast-feed a child while taking this medicine or for 3 months after stopping it. This medicine has caused ovarian failure in some women. This medicine may make it more difficult to get pregnant. Talk to your health care professional if you are concerned about your fertility. This medicine has caused decreased sperm counts in some men. This may make it more difficult to father a child. Talk to your health care professional if you are concerned about your fertility. This medicine may increase your risk of getting an infection. Call your health care professional for advice if you get a fever, chills, or sore throat, or other symptoms of a cold or flu. Do not treat yourself. Try to avoid being around people who are sick. Avoid taking medicines that contain aspirin, acetaminophen, ibuprofen, naproxen, or ketoprofen unless instructed by your health care professional. These medicines may hide a fever.  Be careful brushing or flossing your teeth or using a toothpick because you may get an infection or bleed more easily. If you have any dental work done, tell your dentist you are receiving this medicine. What side effects may I notice from receiving this medication? Side effects that you should report to your doctor or health care professional as soon as possible: allergic reactions like skin rash, itching or hives, swelling of the face, lips, or  tongue breathing problems cough low blood counts - this medicine may decrease the number of white blood cells, red blood cells, and platelets. You may be at increased risk for infections and bleeding nausea, vomiting pain, redness, or irritation at site where injected pain, tingling, numbness in the hands or feet signs and symptoms of bleeding such as bloody or black, tarry stools; red or dark brown urine; spitting up blood or brown material that looks like coffee grounds; red spots on the skin; unusual bruising or bleeding from the eyes, gums, or nose signs and symptoms of a dangerous change in heartbeat or heart rhythm like chest pain; dizziness; fast, irregular heartbeat; palpitations; feeling faint or lightheaded; falls signs and symptoms of infection like fever; chills; cough; sore throat; pain or trouble passing urine signs and symptoms of liver injury like dark yellow or brown urine; general ill feeling or flu-like symptoms; light-colored stools; loss of appetite; nausea; right upper belly pain; unusually weak or tired; yellowing of the eyes or skin signs and symptoms of low red blood cells or anemia such as unusually weak or tired; feeling faint or lightheaded; falls signs and symptoms of muscle injury like dark urine; trouble passing urine or change in the amount of urine; unusually weak or tired; muscle pain; back pain Side effects that usually do not require medical attention (report to your doctor or health care professional if they continue or are bothersome): changes in taste diarrhea gas hair loss loss of appetite mouth sores This list may not describe all possible side effects. Call your doctor for medical advice about side effects. You may report side effects to FDA at 1-800-FDA-1088. Where should I keep my medication? This drug is given in a hospital or clinic and will not be stored at home. NOTE: This sheet is a summary. It may not cover all possible information. If you have  questions about this medicine, talk to your doctor, pharmacist, or health care provider.  2022 [employer]/Gold Standard (2018-10-07 12:20:35)   For any non-urgent questions, you may also contact your provider using MyChart. We now offer e-Visits for anyone 71 and older to request care online for non-urgent symptoms. For details visit [email or web address].   Also download the MyChart app! Go to the app store, search "MyChart", open the app, select [facility], and log in with your MyChart username and password.  Due to Covid, a mask is required upon entering the hospital/clinic. If you do not have a mask, one will be given to you upon arrival. For doctor visits, patients may have 1 support person aged 87 or older with them. For treatment visits, patients cannot have anyone with them due to current Covid guidelines and our immunocompromised population.

## 2021-02-14 NOTE — Progress Notes (Signed)
Delay start of Pembrolizumab per Dr. Burr Medico.  Pt is on Dexamethasone taper currently.  Kennith Center, Pharm.D., CPP 02/14/2021'@11'$ :38 AM

## 2021-02-14 NOTE — Telephone Encounter (Signed)
Scheduled follow-up appointments per 9/13 los. Patient is aware. 

## 2021-02-16 ENCOUNTER — Ambulatory Visit: Payer: Medicare Other | Admitting: Hematology

## 2021-02-16 ENCOUNTER — Other Ambulatory Visit: Payer: Medicare Other

## 2021-02-19 ENCOUNTER — Other Ambulatory Visit: Payer: Self-pay | Admitting: Family Medicine

## 2021-02-20 ENCOUNTER — Ambulatory Visit
Admission: RE | Admit: 2021-02-20 | Discharge: 2021-02-20 | Disposition: A | Discharge status: Home / Self Care | Payer: Medicare Other | Source: Ambulatory Visit | Attending: Pulmonary Disease | Admitting: Pulmonary Disease

## 2021-02-20 ENCOUNTER — Ambulatory Visit: Payer: Medicare Other | Admitting: Hematology

## 2021-02-20 DIAGNOSIS — J441 Chronic obstructive pulmonary disease with (acute) exacerbation: Secondary | ICD-10-CM

## 2021-02-20 DIAGNOSIS — J449 Chronic obstructive pulmonary disease, unspecified: Secondary | ICD-10-CM | POA: Diagnosis not present

## 2021-02-20 DIAGNOSIS — I313 Pericardial effusion (noninflammatory): Secondary | ICD-10-CM | POA: Diagnosis not present

## 2021-02-20 DIAGNOSIS — I700 Atherosclerosis of aorta: Secondary | ICD-10-CM | POA: Diagnosis not present

## 2021-02-20 DIAGNOSIS — J181 Lobar pneumonia, unspecified organism: Secondary | ICD-10-CM

## 2021-02-20 DIAGNOSIS — Z87891 Personal history of nicotine dependence: Secondary | ICD-10-CM

## 2021-02-21 ENCOUNTER — Encounter: Payer: Self-pay | Admitting: Hematology

## 2021-02-21 ENCOUNTER — Inpatient Hospital Stay (HOSPITAL_BASED_OUTPATIENT_CLINIC_OR_DEPARTMENT_OTHER): Payer: Medicare Other | Admitting: Hematology

## 2021-02-21 DIAGNOSIS — K219 Gastro-esophageal reflux disease without esophagitis: Secondary | ICD-10-CM

## 2021-02-21 DIAGNOSIS — C7931 Secondary malignant neoplasm of brain: Secondary | ICD-10-CM | POA: Diagnosis not present

## 2021-02-21 DIAGNOSIS — E1142 Type 2 diabetes mellitus with diabetic polyneuropathy: Secondary | ICD-10-CM | POA: Diagnosis not present

## 2021-02-21 DIAGNOSIS — C155 Malignant neoplasm of lower third of esophagus: Secondary | ICD-10-CM | POA: Diagnosis not present

## 2021-02-21 DIAGNOSIS — J449 Chronic obstructive pulmonary disease, unspecified: Secondary | ICD-10-CM

## 2021-02-21 MED ORDER — PANTOPRAZOLE SODIUM 40 MG PO TBEC: 40.0000 mg | DELAYED_RELEASE_TABLET | Freq: Every day | ORAL | 2 refills | Status: AC

## 2021-02-21 NOTE — Progress Notes (Signed)
[place]   [phone] [phone]) [phone]   Clinic Follow up Note   Patient Care Team: [name], MD as PCP - General (Family Medicine) [facility], [facility] Leonie Man, MD as Consulting Physician (Cardiology) Lucas Mallow, MD as Consulting Physician (Urology) Laroy Apple, MD as Referring Physician (Physical Medicine and Rehabilitation) Bettina Gavia Hilton Cork, MD as Consulting Physician (Cardiology) Marshell Garfinkel, MD as Consulting Physician (Pulmonary Disease) Truitt Merle, MD as Consulting Physician (Oncology) Eppie Gibson, MD as Attending Physician (Radiation Oncology) Royston Bake, RN as Oncology Nurse Navigator (Oncology)  Date of Service:  02/21/2021  I connected with Jason Snyder on 02/21/2021 at 12:40 PM EDT by telephone visit and verified that I am speaking with the correct person using two identifiers.  I discussed the limitations, risks, security and privacy concerns of performing an evaluation and management service by telephone and the availability of in person appointments. I also discussed with the patient that there may be a patient responsible charge related to this service. The patient expressed understanding and agreed to proceed.   Other persons participating in the visit and their role in the encounter:  patient's wife  Patient's location:  home Provider's location:  my office  CHIEF COMPLAINT: f/u of metastatic esophageal cancer  CURRENT THERAPY:  First line chemo CAPOX, trastuzumab and Keytruda every 3 weeks  ASSESSMENT & PLAN:  Jason Snyder is a 70 y.o. male with   1. 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  -The patient's  upper endoscopy as well as CT scans and MRI show medium-sized, ulcerating, non-obstructing, not circumferential mass in distal esophagus.  He has disease to the liver as well. -liver biopsy on 01/24/21 showed adenocarcinoma, compatible with primary cholangiocarcinoma in absence of any other malignancies. -esophageal biopsy from 01/26/21 also showed adenocarcinoma, consistent with esophageal primary.   -Her2 was requested on both biopsy samples-- the esophagus was HER2+ (by FISH, 2+), and the liver was HER2- (0). This raised the question if he has two primaries.  -I recommend first line chemo CAPOX, with trastuzumab and pembrolizumab every 3 weeks. Will hold trastuzumab until after we obtain his echo. Will not start pembro until he weans off steroids  -He began CAPOX on 02/14/21. He tolerated first cycle chem well  -staging chest CT on 02/20/21 showed no acute findings. -scheduled for baseline echo tomorrow, 02/22/21. Will proceed with trastuzumab with C2.   2. Metastatic disease to the brain -He presented with right arm and right leg weakness.   -Brain MRI which showed a 1.9 cm mass in the left frontoparietal white matter with extensive surrounding edema. Another punctate left occipital metastasis was seen on the 3 Tesla MRI.  -Overall, symptoms improving since starting Decadron.  Currently taking 2 mg twice daily. -He underwent SRS to the metastatic brain lesions on 02/09/21 under the care of Dr. Isidore Moos.   3. GERD -The patient has a longstanding history of GERD. -He is currently taking pantoproazole 40 mg BID -Encouraged for him to continue taking this as he needs PPI for current steroid use and longstanding GERD   4. Comorbidities (COPD, DM) -COPD controlled with inhalers. Patient quit smoking in 2018. Never required hospitalizations for COPD -DM also under good  control per patient. Follows closely with PCP. On gabapentin for peripheral neuropathy.  -plan to start finasteride under urology      PLAN: -he is tolerating chemotherapy well, will continue Xarelto until 9/29 ( a total of 12 days) so he will have 1 week off before next cycle -echo 02/22/21 -labs, flush, f/u, and C2 on 03/08/21, plan to start trastuzumab from cycle 2   No problem-specific Assessment & Plan notes found for this encounter.    SUMMARY OF ONCOLOGIC HISTORY: Oncology History  Esophageal cancer ([place])  01/22/2021 Imaging   CT of the Head  IMPRESSION: Left parietal mass with extensive vasogenic edema most concerning for a metastasis. Brain MRI without and with contrast is recommended for further evaluation.     01/22/2021 Imaging   CT CHEST, ABDOMEN, AND PELVIS  IMPRESSION: 1. Indeterminate hypodensity within the right lobe liver abutting the gallbladder fossa, concerning for neoplasm given intracranial findings. If tissue diagnosis is desired, image guided biopsy could be considered. Given the decreased conspicuity on delayed imaging, follow-up liver ultrasound with possible ultrasound-guided biopsy could be considered. Alternatively, dedicated liver MRI could be considered. 2. Enlarged prostate, with asymmetric decreased attenuation of the right peripheral zone of the prostate. Please correlate with serum PSA and physical exam findings. 3. Distended urinary bladder, which may reflect chronic bladder outlet obstruction given the enlarged prostate. 4. 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 5. No acute intrathoracic process. 6. Aortic Atherosclerosis (ICD10-I70.0). Severe atherosclerosis of the bilateral common femoral arteries with greater than 90% stenosis.    01/22/2021 Imaging   BRAIN MRI W/WO CONTRAST   IMPRESSION: 1.9 cm mass within the left frontoparietal white matter with extensive surrounding edema. Favor metastatic disease over primary neoplasm.    01/26/2021 Pathology Results   CASE: [id number] A. ESOPHAGUS, DISTAL, BIOPSY:  - Adenocarcinoma, see comment.   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 Dr. Vic Ripper has reviewed the case.   01/26/2021 Procedure   Findings: A small hiatal hernia was present. The entire examined stomach was normal. The examined duodenum was normal. - Likely malignant esophageal tumor was found in the distal esophagus. Biopsied. - Small hiatal hernia. - Normal stomach.   01/26/2021 Cancer Staging   Staging form: Esophagus - Adenocarcinoma, AJCC 8th Edition - Clinical stage from 01/26/2021: Stage IVB (cTX, cNX, pM1) - Signed by Truitt Merle, MD on 02/02/2021 Stage prefix: Initial diagnosis   02/02/2021 Initial Diagnosis   Esophageal cancer ([place])   02/14/2021 -  Chemotherapy    Patient is on Treatment Plan: GASTRIC CAPEOX (1000/130) Q21D X 8 CYCLES    Patient is on Antibody Plan: [place] Q21D     02/16/2021 - 02/16/2021 Chemotherapy      Patient is on Antibody Plan: HEAD/NECK PEMBROLIZUMAB Q21D     03/08/2021 -  Chemotherapy    Patient is on Treatment Plan: GASTRIC CAPEOX (1000/130) Q21D X 8 CYCLES    Patient is on Antibody Plan: Calla Kicks AND PEMBROLIZUMAB Q21D        INTERVAL HISTORY:  Jason Snyder was contacted for a follow up of metastatic esophageal cancer. He was last seen by me on 02/13/21. He reports heartburn is his main complaint. He notes no real nausea and only mild cold sensitivity. He also reports joint pain in his hands, described as cramping. He notes his arthritis is also exacerbated. He endorses drinking a  lot of water.  He notes his BG has been high in the mornings, around 180-190.   All other systems were reviewed with the patient and are negative.  MEDICAL HISTORY:  Past Medical History:  Diagnosis Date   BMI 30.0-30.9,adult    BPH without obstruction/lower urinary tract symptoms 10/03/2017   CAD (coronary  artery disease)    cath 2020, single vessel disease, medical mgt   Chronic left shoulder pain c   Chronic low back pain with right-sided sciatica    Chronic pain 10/07/2013   Combined hyperlipidemia associated with type 2 diabetes mellitus ([place]) 07/09/2019   Controlled type 2 diabetes mellitus without complication, without long-term current use of insulin ([place]) 07/12/2014   COPD (chronic obstructive pulmonary disease) ([place]) 01/07/2014   PFTs 07/2018 minimal airway obstruction, overinflation and no response to bronchodilators.    Coronary artery disease involving native coronary artery of native heart without angina pectoris    Cardiac Cath 01/2019: Severe single-vessel disease with additional moderate two-vessel disease: 100% CTO of RCA, moderate disease in proximal LCx, distal LCx and very small caliber 1st Diag DFR-FFR negative lesion in proximal LCx Normal LV pressures with previously document, ed normal EF.   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 On testosterone replacement   Obesity (BMI 30-39.9)    Obesity (BMI 30.0-34.9) 10/03/2017   OSA on CPAP 10/07/2013   Primary osteoarthritis involving multiple joints 01/10/2015   Rosacea    Smoking greater than 30 pack years - quit 03/2018 10/03/2017   Thoracic and lumbosacral neuritis    Type 2 diabetes mellitus with peripheral neuropathy ([place]) 07/12/2014    SURGICAL HISTORY: Past Surgical History:  Procedure Laterality Date   ARTHROSCOPIC REPAIR ACL      BIOPSY  01/26/2021   Procedure: BIOPSY;  Surgeon: Thornton Park, MD;  Location: [facility];  Service: Gastroenterology;;   CARDIAC CATHETERIZATION     ESOPHAGOGASTRODUODENOSCOPY (EGD) WITH PROPOFOL N/A 01/26/2021   Procedure: ESOPHAGOGASTRODUODENOSCOPY (EGD) WITH PROPOFOL;  Surgeon: Thornton Park, MD;  Location: [facility];  Service: Gastroenterology;  Laterality: N/A;   INTRAVASCULAR PRESSURE WIRE/FFR STUDY N/A 01/01/2019   Procedure: INTRAVASCULAR PRESSURE WIRE/FFR STUDY;  Surgeon: Leonie Man, MD;  Location: [facility];  Service: Cardiovascular;  Laterality: N/A;   IR IMAGING GUIDED PORT INSERTION  02/12/2021   LEFT HEART CATH AND CORONARY ANGIOGRAPHY N/A 01/01/2019   Procedure: LEFT HEART CATH AND CORONARY ANGIOGRAPHY;  Surgeon: Leonie Man, MD;  Location: [facility];  Service: Cardiovascular;  Laterality: N/A;   PROSTATE CRYOABLATION     03/2019   SHOULDER ARTHROSCOPY     SPINE SURGERY      I have reviewed the social history and family history with the patient and they are unchanged from previous note.  ALLERGIES:  is allergic to rubus fruticosus.  MEDICATIONS:  Current Outpatient Medications  Medication Sig Dispense Refill   pantoprazole (PROTONIX) 40 MG tablet Take 1 tablet (40 mg total) by mouth daily. 30 tablet 2   Accu-Chek  Softclix Lancets lancets Use as directed up to 4 times daily 100 each 5   acetaminophen-codeine (TYLENOL #3) 300-30 MG tablet Take 1 tablet by mouth 2 (two) times daily as needed for moderate pain. 90 tablet 0   acetic acid 2 % otic solution Place 4 drops into both ears 2 (two) times daily as needed. (Patient taking differently: Place 4 drops into both ears 2 (two) times daily as needed (pain).) 15 mL 2   albuterol (VENTOLIN HFA) 108 (90 Base) MCG/ACT inhaler INHALE TWO PUFFS BY MOUTH INTO THE LUNGS EVERY FOUR HOURS AS NEEDED FOR WHEEZING 18 g 0   amLODipine (NORVASC) 10 MG tablet Take 1 tablet (10 mg total) by mouth daily. 90  tablet 0   aspirin 81 MG EC tablet Take 81 mg by mouth daily.      atorvastatin (LIPITOR) 20 MG tablet Take 1 tablet (20 mg total) by mouth at bedtime. 90 tablet 3   Blood Glucose Monitoring Suppl (BLOOD GLUCOSE MONITOR SYSTEM) w/Device KIT Please check blood glucose three times daily (before meals in the morning and 2 hours after largest meals of the day OR lunch and dinner) 1 kit 0   capecitabine (XELODA) 500 MG tablet Take 4 tablets (2,000 mg total) by mouth 2 (two) times daily. Take 14 days on, 7 days off, repeat every 21 days. 112 tablet 0   cyclobenzaprine (FLEXERIL) 10 MG tablet Take 1 tablet (10 mg total) by mouth 3 (three) times daily as needed for muscle spasms. 90 tablet 3   dexamethasone (DECADRON) 2 MG tablet Take 1 tablet (2 mg total) by mouth every 12 (twelve) hours. Taper to 72m daily on 9/30. Taper to 246mevery other day on 10/21. Last dose: 11/1. 40 tablet 1   diclofenac sodium (VOLTAREN) 1 % GEL Apply 2 g topically 4 (four) times daily as needed (pain).     FARXIGA 10 MG TABS tablet TAKE ONE TABLET BY MOUTH ONE TIME DAILY (Patient taking differently: Take 10 mg by mouth daily.) 90 tablet 3   fluticasone (FLONASE) 50 MCG/ACT nasal spray Place 1 spray into the nose daily as needed for allergies.      gabapentin (NEURONTIN) 300 MG capsule Take 1 capsule (300 mg total) by mouth 3 (three) times daily. 180 capsule 3   glucose blood (ACCU-CHEK GUIDE) test strip Use as instructed up to 4 times daily 100 each 12   Ketotifen Fumarate (ALLERGY EYE DROPS OP) Place 1 drop into both eyes daily as needed (allergy).     lidocaine-prilocaine (EMLA) cream Apply 1 application topically as needed. 30 g 2   lidocaine-prilocaine (EMLA) cream Apply to affected area once 30 g 3   Loratadine 10 MG CAPS Take 10 mg by mouth daily as needed (allergies).      metFORMIN (GLUCOPHAGE) 500 MG tablet Take 1 tablet (500 mg total) by mouth 2 (two) times daily with a meal. 180 tablet 0   metoprolol tartrate  (LOPRESSOR) 50 MG tablet Take 1 tablet (50 mg total) by mouth 2 (two) times daily. 180 tablet 3   Multiple Vitamin (MULTIVITAMIN) capsule Take 1 capsule by mouth daily.      nitroGLYCERIN (NITROSTAT) 0.4 MG SL tablet Place 1 tablet (0.4 mg total) under the tongue every 5 (five) minutes as needed for chest pain. 25 tablet 0   ondansetron (ZOFRAN) 8 MG tablet Take 1 tablet (8 mg total) by mouth every 8 (eight) hours as needed for nausea or vomiting. 30 tablet 2  ondansetron (ZOFRAN) 8 MG tablet Take 1 tablet (8 mg total) by mouth 2 (two) times daily as needed for refractory nausea / vomiting. Start on day 3 after chemotherapy. 30 tablet 1   prochlorperazine (COMPAZINE) 10 MG tablet Take 1 tablet (10 mg total) by mouth every 6 (six) hours as needed. 30 tablet 2   prochlorperazine (COMPAZINE) 10 MG tablet Take 1 tablet (10 mg total) by mouth every 6 (six) hours as needed (Nausea or vomiting). 30 tablet 1   Spacer/Aero-Holding Chambers (AEROCHAMBER MV) inhaler Use as instructed 1 each 0   tamsulosin (FLOMAX) 0.4 MG CAPS capsule TAKE TWO CAPSULES BY MOUTH DAILY (Patient taking differently: Take 0.4 mg by mouth daily.) 180 capsule 0   Tiotropium Bromide-Olodaterol (STIOLTO RESPIMAT) 2.5-2.5 MCG/ACT AERS Inhale 2 puffs into the lungs daily. 4 g 11   No current facility-administered medications for this visit.    PHYSICAL EXAMINATION: ECOG PERFORMANCE STATUS: 2 - Symptomatic, <50% confined to bed  There were no vitals filed for this visit. Wt Readings from Last 3 Encounters:  02/13/21 239 lb (108.4 kg)  02/02/21 238 lb 8 oz (108.2 kg)  01/31/21 238 lb (108 kg)     No vitals taken today, Exam not performed today  LABORATORY DATA:  I have reviewed the data as listed CBC Latest Ref Rng & Units 02/13/2021 02/02/2021 01/26/2021  WBC 4.0 - 10.5 K/uL 9.6 9.5 8.1  Hemoglobin 13.0 - 17.0 g/dL 15.8 16.6 15.4  Hematocrit 39.0 - 52.0 % 46.6 48.5 45.7  Platelets 150 - 400 K/uL 204 276 244     CMP Latest  Ref Rng & Units 02/13/2021 02/02/2021 01/26/2021  Glucose 70 - 99 mg/dL 181(H) 143(H) 119(H)  BUN 8 - 23 mg/dL 20 19 24(H)  Creatinine 0.61 - 1.24 mg/dL 0.91 0.89 1.06  Sodium 135 - 145 mmol/L 139 140 137  Potassium 3.5 - 5.1 mmol/L 4.4 4.2 4.2  Chloride 98 - 111 mmol/L 103 104 101  CO2 22 - 32 mmol/L _0 Calcium 8.9 - 10.3 mg/dL 8.8(L) 9.2 8.7(L)  Total Protein 6.5 - 8.1 g/dL 6.2(L) 6.8 6.0(L)  Total Bilirubin 0.3 - 1.2 mg/dL 0.8 0.6 0.9  Alkaline Phos 38 - 126 U/L 83 96 43  AST 15 - 41 U/L 14(L) 23 24  ALT 0 - 44 U/L 55(H) 147(H) 36      RADIOGRAPHIC STUDIES: I have personally reviewed the radiological images as listed and agreed with the findings in the report. CT Chest Wo Contrast  Result Date: 02/20/2021 CLINICAL DATA:  COPD exacerbation. Esophageal cancer with metastases. EXAM: CT CHEST WITHOUT CONTRAST TECHNIQUE: Multidetector CT imaging of the chest was performed following the standard protocol without IV contrast. COMPARISON:  11/03/2020 FINDINGS: Cardiovascular: Tiny anterior pericardial effusion. Coronary artery calcification is evident. Mild atherosclerotic calcification is noted in the wall of the thoracic aorta. Right Port-A-Cath tip is positioned in the distal SVC. Mediastinum/Nodes: No mediastinal lymphadenopathy by size criteria. 8 mm low paraesophageal node on 23/2 is stable. No evidence for gross hilar lymphadenopathy although assessment is limited by the lack of intravenous contrast on the current study. Probable mild wall thickening distal esophagus. There is no axillary lymphadenopathy. Lungs/Pleura: No suspicious pulmonary nodule or mass. No focal airspace consolidation. No pleural effusion. Mild bronchial wall thickening. Upper Abdomen: The liver shows diffusely decreased attenuation suggesting fat deposition. Stable 11 mm hypodensity towards the hepatic dome. Musculoskeletal: No worrisome lytic or sclerotic osseous abnormality. IMPRESSION: 1. No acute findings in the  chest. Specifically, no findings to explain the patient's history of shortness of breath. 2. Probable mild wall thickening distal esophagus. 3. Hepatic steatosis with stable hypodensity towards the hepatic dome. Recent MRI characterized multiple hepatic lesions suspicious for metastatic disease. 4. Aortic Atherosclerosis (ICD10-I70.0). Electronically Signed   By: Misty Stanley M.D.   On: 02/20/2021 13:01      No orders of the defined types were placed in this encounter.  All questions were answered. The patient knows to call the clinic with any problems, questions or concerns. No barriers to learning was detected. The total time spent in the appointment was 15 minutes.     Truitt Merle, MD 02/21/2021   I, Wilburn Mylar, am acting as scribe for Truitt Merle, MD.   I have reviewed the above documentation for accuracy and completeness, and I agree with the above.

## 2021-02-22 ENCOUNTER — Ambulatory Visit (HOSPITAL_COMMUNITY)
Admission: RE | Admit: 2021-02-22 | Discharge: 2021-02-22 | Disposition: A | Discharge status: Home / Self Care | Payer: Medicare Other | Source: Ambulatory Visit | Attending: Hematology | Admitting: Hematology

## 2021-02-22 ENCOUNTER — Other Ambulatory Visit: Payer: Self-pay

## 2021-02-22 DIAGNOSIS — Z0189 Encounter for other specified special examinations: Secondary | ICD-10-CM | POA: Diagnosis not present

## 2021-02-22 DIAGNOSIS — E785 Hyperlipidemia, unspecified: Secondary | ICD-10-CM | POA: Diagnosis not present

## 2021-02-22 DIAGNOSIS — I509 Heart failure, unspecified: Secondary | ICD-10-CM | POA: Insufficient documentation

## 2021-02-22 DIAGNOSIS — I2510 Atherosclerotic heart disease of native coronary artery without angina pectoris: Secondary | ICD-10-CM | POA: Diagnosis not present

## 2021-02-22 DIAGNOSIS — I110 Hypertensive heart disease with heart failure: Secondary | ICD-10-CM | POA: Diagnosis not present

## 2021-02-22 DIAGNOSIS — E119 Type 2 diabetes mellitus without complications: Secondary | ICD-10-CM | POA: Diagnosis not present

## 2021-02-22 DIAGNOSIS — J449 Chronic obstructive pulmonary disease, unspecified: Secondary | ICD-10-CM | POA: Diagnosis not present

## 2021-02-22 DIAGNOSIS — Z01810 Encounter for preprocedural cardiovascular examination: Secondary | ICD-10-CM | POA: Insufficient documentation

## 2021-02-22 DIAGNOSIS — C801 Malignant (primary) neoplasm, unspecified: Secondary | ICD-10-CM | POA: Insufficient documentation

## 2021-02-22 LAB — ECHOCARDIOGRAM COMPLETE
Area-P 1/2: 2.91 cm2
Calc EF: 62.2 %
S' Lateral: 3.2 cm
Single Plane A2C EF: 53.1 %
Single Plane A4C EF: 73.8 %

## 2021-02-22 NOTE — Progress Notes (Signed)
  Echocardiogram 2D Echocardiogram has been performed.  Jason Snyder 02/22/2021, 11:09 AM

## 2021-02-26 ENCOUNTER — Telehealth: Payer: Self-pay

## 2021-02-26 NOTE — Telephone Encounter (Signed)
Received VM from patient reporting he was experiencing a "set back with coordination on my right side" and wanted to make Dr. Isidore Moos aware.   Returned patient's call to gather more information. Patient stated the symptoms he's currently experiencing began about 4 days ago. He reports the right side of his body has delayed response and diminished sensation. He states he has almost fallen ~3 times today, and he's struggling to find words again. Denied any nausea or vision changes. He confirmed that he is still taking dexamethasone 2 mg BID, but stated he believed his medical oncologist wanted him off the steroid before his next cycle of systemic therapy.  Informed patient I would make both Dr. Isidore Moos and Dr. Burr Medico aware of his current state, and call him back with an update once I had direction from them. Patient verbalized understanding and agreement of plan

## 2021-02-27 ENCOUNTER — Telehealth: Payer: Self-pay

## 2021-02-27 DIAGNOSIS — H608X3 Other otitis externa, bilateral: Secondary | ICD-10-CM | POA: Diagnosis not present

## 2021-02-27 DIAGNOSIS — H6122 Impacted cerumen, left ear: Secondary | ICD-10-CM | POA: Diagnosis not present

## 2021-02-27 NOTE — Telephone Encounter (Signed)
Called and spoke with patient to let him know that Dr. Isidore Moos would like to see him sooner from his F/U appointment (new date/time is Monday 03/05/21 at 2pm). She would also like him to increase his steroid does to 8mg  BID for today, and then 4 mg BID until he sees her next Monday. Patient verbalized understanding and agreement of plan. He has my direct number to call should he experience any new symptoms, or feel like his status is not improving with the temporary increase in his steroid. No other needs identified at this time. Patient verbalized appreciation of call

## 2021-02-28 ENCOUNTER — Other Ambulatory Visit (HOSPITAL_COMMUNITY): Payer: Self-pay

## 2021-02-28 ENCOUNTER — Other Ambulatory Visit: Payer: Self-pay | Admitting: Hematology

## 2021-02-28 DIAGNOSIS — C155 Malignant neoplasm of lower third of esophagus: Secondary | ICD-10-CM

## 2021-02-28 MED ORDER — CAPECITABINE 500 MG PO TABS
850.0000 mg/m2 | ORAL_TABLET | Freq: Two times a day (BID) | ORAL | 0 refills | Status: AC
  Filled 2021-02-28: qty 112, 21d supply, fill #0

## 2021-03-01 ENCOUNTER — Other Ambulatory Visit (HOSPITAL_COMMUNITY): Payer: Self-pay

## 2021-03-04 ENCOUNTER — Other Ambulatory Visit: Payer: Self-pay | Admitting: Family Medicine

## 2021-03-05 ENCOUNTER — Encounter: Payer: Self-pay | Admitting: Radiation Oncology

## 2021-03-05 ENCOUNTER — Other Ambulatory Visit (HOSPITAL_COMMUNITY): Payer: Self-pay

## 2021-03-05 ENCOUNTER — Other Ambulatory Visit: Payer: Self-pay

## 2021-03-05 ENCOUNTER — Encounter: Payer: Self-pay | Admitting: Hematology

## 2021-03-05 ENCOUNTER — Ambulatory Visit
Admission: RE | Admit: 2021-03-05 | Discharge: 2021-03-05 | Disposition: A | Discharge status: Home / Self Care | Payer: Medicare Other | Source: Ambulatory Visit | Attending: Radiation Oncology | Admitting: Radiation Oncology

## 2021-03-05 VITALS — BP 136/88 | HR 83 | Resp 19 | Wt 225.5 lb

## 2021-03-05 DIAGNOSIS — Z7982 Long term (current) use of aspirin: Secondary | ICD-10-CM | POA: Insufficient documentation

## 2021-03-05 DIAGNOSIS — R350 Frequency of micturition: Secondary | ICD-10-CM | POA: Diagnosis not present

## 2021-03-05 DIAGNOSIS — Z79899 Other long term (current) drug therapy: Secondary | ICD-10-CM | POA: Diagnosis not present

## 2021-03-05 DIAGNOSIS — K760 Fatty (change of) liver, not elsewhere classified: Secondary | ICD-10-CM | POA: Diagnosis not present

## 2021-03-05 DIAGNOSIS — C159 Malignant neoplasm of esophagus, unspecified: Secondary | ICD-10-CM | POA: Diagnosis not present

## 2021-03-05 DIAGNOSIS — Z7984 Long term (current) use of oral hypoglycemic drugs: Secondary | ICD-10-CM | POA: Diagnosis not present

## 2021-03-05 DIAGNOSIS — E785 Hyperlipidemia, unspecified: Secondary | ICD-10-CM | POA: Insufficient documentation

## 2021-03-05 DIAGNOSIS — I10 Essential (primary) hypertension: Secondary | ICD-10-CM | POA: Diagnosis not present

## 2021-03-05 DIAGNOSIS — K1231 Oral mucositis (ulcerative) due to antineoplastic therapy: Secondary | ICD-10-CM | POA: Diagnosis not present

## 2021-03-05 DIAGNOSIS — E1140 Type 2 diabetes mellitus with diabetic neuropathy, unspecified: Secondary | ICD-10-CM | POA: Diagnosis not present

## 2021-03-05 DIAGNOSIS — I2510 Atherosclerotic heart disease of native coronary artery without angina pectoris: Secondary | ICD-10-CM | POA: Insufficient documentation

## 2021-03-05 DIAGNOSIS — C7931 Secondary malignant neoplasm of brain: Secondary | ICD-10-CM

## 2021-03-05 DIAGNOSIS — R270 Ataxia, unspecified: Secondary | ICD-10-CM | POA: Insufficient documentation

## 2021-03-05 DIAGNOSIS — R42 Dizziness and giddiness: Secondary | ICD-10-CM | POA: Insufficient documentation

## 2021-03-05 DIAGNOSIS — R2681 Unsteadiness on feet: Secondary | ICD-10-CM | POA: Diagnosis not present

## 2021-03-05 DIAGNOSIS — I082 Rheumatic disorders of both aortic and tricuspid valves: Secondary | ICD-10-CM | POA: Insufficient documentation

## 2021-03-05 MED ORDER — LIDOCAINE VISCOUS HCL 2 % MT SOLN
OROMUCOSAL | 0 refills | Status: AC
  Filled 2021-03-05: qty 200, 5d supply, fill #0

## 2021-03-05 MED ORDER — DEXAMETHASONE 2 MG PO TABS
ORAL_TABLET | ORAL | 0 refills | Status: AC
  Filled 2021-03-05: qty 60, 15d supply, fill #0

## 2021-03-05 NOTE — Progress Notes (Signed)
Radiation Oncology         [phone] ________________________________  Name: Jason Snyder MRN: [id number]  Date: 03/05/2021  DOB: 1950-10-17  Follow-Up Visit Note  Outpatient  CC: Jason Arnt, MD  Jason Arnt, MD  Diagnosis and Prior Radiotherapy:    ICD-10-CM   1. Brain metastases (HCC)  C79.31 lidocaine (XYLOCAINE) 2 % solution    dexamethasone (DECADRON) 2 MG tablet    2. Mucositis due to antineoplastic therapy  K12.31 lidocaine (XYLOCAINE) 2 % solution     On 02/09/2021 Jason Snyder received stereotactic radiosurgery to the following targets:  21 mm left parietal tumor received 18 Gray in 1 fraction.  3 mm left occipital tumor received 20 Gray in 1 fraction   SRS IMRT technique was used.  6 MV flattening filter free photons used.  ExacTrac Snap verification was performed for each couch angle.   CHIEF COMPLAINT: Here for follow-up and surveillance of brain cancer  Narrative:  The patient returns today for routine follow-up.  Jason Snyder presents today for follow-up after completing single SRS treatment to his brain on 02/09/2021  Dose of Decadron, if applicable: 4 mg PO twice a day  Recent neurologic symptoms, if any:  Seizures: Patient denies Headaches: Patient denies Nausea: Patient denies--reports a stable appetite Dizziness/ataxia: Yes--he reports feeling dizzy and unsteady on his feet. Using a cane for additional balance support Difficulty with hand coordination: Reports this has improved. States his handwriting is legible once again Focal numbness/weakness: Reports pain and weakness to his right thigh. States it is difficult to walk up stairs Visual deficits/changes: Patient denies Confusion/Memory deficits: Reports this has improved. No longer feels he has any cognitive deficits   Additional Complaints / other details: Reports he's not sleeping well. Reports an increase in urinary frequency and difficulty emptying his bladder fully. States he saw his  urologist last week and was started on a new medication to help symptoms. Denies any issues with constipation and reports regular BMs. Reports soreness/sensitivity to his mouth that he started noticing about 4 days ago.                                 ALLERGIES:  is allergic to rubus fruticosus.  Meds: Current Outpatient Medications  Medication Sig Dispense Refill   lidocaine (XYLOCAINE) 2 % solution Mix 1 part 2% viscous lidocaine with 1 part water. Swish and swallow 64m of diluted mixture --use 30 minutes before meals and at bedtime, up to 4 times a day 200 mL 0   Accu-Chek Softclix Lancets lancets Use as directed up to 4 times daily 100 each 5   acetaminophen-codeine (TYLENOL #3) 300-30 MG tablet Take 1 tablet by mouth 2 (two) times daily as needed for moderate pain. 90 tablet 0   acetic acid 2 % otic solution Place 4 drops into both ears 2 (two) times daily as needed. (Patient taking differently: Place 4 drops into both ears 2 (two) times daily as needed (pain).) 15 mL 2   albuterol (VENTOLIN HFA) 108 (90 Base) MCG/ACT inhaler INHALE TWO PUFFS BY MOUTH INTO THE LUNGS EVERY FOUR HOURS AS NEEDED FOR WHEEZING 18 g 0   amLODipine (NORVASC) 10 MG tablet Take 1 tablet (10 mg total) by mouth daily. 90 tablet 0   aspirin 81 MG EC tablet Take 81 mg by mouth daily.      atorvastatin (LIPITOR) 20 MG tablet Take 1 tablet (20 mg  total) by mouth at bedtime. 90 tablet 3   Blood Glucose Monitoring Suppl (BLOOD GLUCOSE MONITOR SYSTEM) w/Device KIT Please check blood glucose three times daily (before meals in the morning and 2 hours after largest meals of the day OR lunch and dinner) 1 kit 0   capecitabine (XELODA) 500 MG tablet Take 4 tablets (2,000 mg total) by mouth 2 (two) times daily. Take 14 days on, 7 days off, repeat every 21 days. 112 tablet 0   cyclobenzaprine (FLEXERIL) 10 MG tablet TAKE ONE TABLET BY MOUTH THREE TIMES DAILY AS NEEDED 90 tablet 0   dexamethasone (DECADRON) 2 MG tablet Taper to 2  tablets (35m) twice daily until instructed otherwise. 60 tablet 0   diclofenac sodium (VOLTAREN) 1 % GEL Apply 2 g topically 4 (four) times daily as needed (pain).     FARXIGA 10 MG TABS tablet TAKE ONE TABLET BY MOUTH ONE TIME DAILY (Patient taking differently: Take 10 mg by mouth daily.) 90 tablet 3   finasteride (PROSCAR) 5 MG tablet Take 5 mg by mouth daily.     fluticasone (FLONASE) 50 MCG/ACT nasal spray Place 1 spray into the nose daily as needed for allergies.      gabapentin (NEURONTIN) 300 MG capsule Take 1 capsule (300 mg total) by mouth 3 (three) times daily. 180 capsule 3   glucose blood (ACCU-CHEK GUIDE) test strip Use as instructed up to 4 times daily 100 each 12   Ketotifen Fumarate (ALLERGY EYE DROPS OP) Place 1 drop into both eyes daily as needed (allergy).     lidocaine-prilocaine (EMLA) cream Apply 1 application topically as needed. 30 g 2   lidocaine-prilocaine (EMLA) cream Apply to affected area once 30 g 3   Loratadine 10 MG CAPS Take 10 mg by mouth daily as needed (allergies).      metFORMIN (GLUCOPHAGE) 500 MG tablet Take 1 tablet (500 mg total) by mouth 2 (two) times daily with a meal. 180 tablet 0   metoprolol tartrate (LOPRESSOR) 50 MG tablet Take 1 tablet (50 mg total) by mouth 2 (two) times daily. 180 tablet 3   Multiple Vitamin (MULTIVITAMIN) capsule Take 1 capsule by mouth daily.      nitroGLYCERIN (NITROSTAT) 0.4 MG SL tablet Place 1 tablet (0.4 mg total) under the tongue every 5 (five) minutes as needed for chest pain. 25 tablet 0   ondansetron (ZOFRAN) 8 MG tablet Take 1 tablet (8 mg total) by mouth every 8 (eight) hours as needed for nausea or vomiting. 30 tablet 2   ondansetron (ZOFRAN) 8 MG tablet Take 1 tablet (8 mg total) by mouth 2 (two) times daily as needed for refractory nausea / vomiting. Start on day 3 after chemotherapy. 30 tablet 1   pantoprazole (PROTONIX) 40 MG tablet Take 1 tablet (40 mg total) by mouth daily. 30 tablet 2   prochlorperazine  (COMPAZINE) 10 MG tablet Take 1 tablet (10 mg total) by mouth every 6 (six) hours as needed. 30 tablet 2   prochlorperazine (COMPAZINE) 10 MG tablet Take 1 tablet (10 mg total) by mouth every 6 (six) hours as needed (Nausea or vomiting). 30 tablet 1   Spacer/Aero-Holding Chambers (AEROCHAMBER MV) inhaler Use as instructed 1 each 0   tamsulosin (FLOMAX) 0.4 MG CAPS capsule TAKE TWO CAPSULES BY MOUTH DAILY (Patient taking differently: Take 0.4 mg by mouth daily.) 180 capsule 0   Tiotropium Bromide-Olodaterol (STIOLTO RESPIMAT) 2.5-2.5 MCG/ACT AERS Inhale 2 puffs into the lungs daily. 4 g 11   No  current facility-administered medications for this encounter.    Physical Findings: The patient is in no acute distress. Patient is alert and oriented.  weight is 225 lb 8 oz (102.3 kg). His blood pressure is 136/88 and his pulse is 83. His respiration is 19 and oxygen saturation is 99%. .     HEENT: No oral thrush but he does have at least 1 visible ulcer in his mouth and erythema in the oral mucosa  Neuro: Focal weakness in the right leg; sensation intact in all extremities.  Arm strength intact.  Finger-nose testing intact.  Rapidly alternating movements intact.  Speech is fluent.  Psych: Judgment and insight appear intact.  Pleasant affect   Lab Findings: Lab Results  Component Value Date   WBC 9.6 02/13/2021   HGB 15.8 02/13/2021   HCT 46.6 02/13/2021   MCV 93.4 02/13/2021   PLT 204 02/13/2021    Radiographic Findings: CT Chest Wo Contrast  Result Date: 02/20/2021 CLINICAL DATA:  COPD exacerbation. Esophageal cancer with metastases. EXAM: CT CHEST WITHOUT CONTRAST TECHNIQUE: Multidetector CT imaging of the chest was performed following the standard protocol without IV contrast. COMPARISON:  11/03/2020 FINDINGS: Cardiovascular: Tiny anterior pericardial effusion. Coronary artery calcification is evident. Mild atherosclerotic calcification is noted in the wall of the thoracic aorta. Right  Port-A-Cath tip is positioned in the distal SVC. Mediastinum/Nodes: No mediastinal lymphadenopathy by size criteria. 8 mm low paraesophageal node on 23/2 is stable. No evidence for gross hilar lymphadenopathy although assessment is limited by the lack of intravenous contrast on the current study. Probable mild wall thickening distal esophagus. There is no axillary lymphadenopathy. Lungs/Pleura: No suspicious pulmonary nodule or mass. No focal airspace consolidation. No pleural effusion. Mild bronchial wall thickening. Upper Abdomen: The liver shows diffusely decreased attenuation suggesting fat deposition. Stable 11 mm hypodensity towards the hepatic dome. Musculoskeletal: No worrisome lytic or sclerotic osseous abnormality. IMPRESSION: 1. No acute findings in the chest. Specifically, no findings to explain the patient's history of shortness of breath. 2. Probable mild wall thickening distal esophagus. 3. Hepatic steatosis with stable hypodensity towards the hepatic dome. Recent MRI characterized multiple hepatic lesions suspicious for metastatic disease. 4. Aortic Atherosclerosis (ICD10-I70.0). Electronically Signed   By: Eric  Mansell M.D.   On: 02/20/2021 13:01   ECHOCARDIOGRAM COMPLETE  Result Date: 02/22/2021    ECHOCARDIOGRAM REPORT   Patient Name:   Dorrien Hon Date of Exam: 02/22/2021 Medical Rec #:  [id number]   Height:       74.0 in Accession #:    [id number]  Weight:       239.0 lb Date of Birth:  08/25/1950   BSA:          2.344 m Patient Age:    69 years    BP:           160/90 mmHg Patient Gender: M           HR:           71 bpm. Exam Location:  Outpatient Procedure: 2D Echo, 3D Echo, Cardiac Doppler, Color Doppler and Strain Analysis Indications:    Z51.11 Encounter for antineoplastic chemotheraphy  History:        Patient has prior history of Echocardiogram examinations, most                 recent 12/09/2018. CAD, COPD; Risk Factors:Hypertension, Diabetes                 and Dyslipidemia.  Metastatic cancer.    Sonographer:    Roseanna Rainbow RDCS Referring Phys: [id number] [facility]  Sonographer Comments: Technically difficult study due to poor echo windows. Global longitudinal strain was attempted. IMPRESSIONS  1. Left ventricular ejection fraction, by estimation, is 60 to 65%. The left ventricle has normal function. The left ventricle has no regional wall motion abnormalities. There is mild left ventricular hypertrophy. Left ventricular diastolic parameters are consistent with Grade I diastolic dysfunction (impaired relaxation). The average left ventricular global longitudinal strain is -20.5 %.  2. Right ventricular systolic function is normal. The right ventricular size is normal.  3. The mitral valve is normal in structure. Trivial mitral valve regurgitation. No evidence of mitral stenosis.  4. The aortic valve is normal in structure. There is mild calcification of the aortic valve. Aortic valve regurgitation is mild. No aortic stenosis is present.  5. The inferior vena cava is normal in size with greater than 50% respiratory variability, suggesting right atrial pressure of 3 mmHg. FINDINGS  Left Ventricle: Left ventricular ejection fraction, by estimation, is 60 to 65%. The left ventricle has normal function. The left ventricle has no regional wall motion abnormalities. The average left ventricular global longitudinal strain is -20.5 %. The left ventricular internal cavity size was normal in size. There is mild left ventricular hypertrophy. Left ventricular diastolic parameters are consistent with Grade I diastolic dysfunction (impaired relaxation). Right Ventricle: The right ventricular size is normal. No increase in right ventricular wall thickness. Right ventricular systolic function is normal. Left Atrium: Left atrial size was normal in size. Right Atrium: Right atrial size was normal in size. Pericardium: There is no evidence of pericardial effusion. Mitral Valve: The mitral valve is normal in  structure. Trivial mitral valve regurgitation. No evidence of mitral valve stenosis. Tricuspid Valve: The tricuspid valve is normal in structure. Tricuspid valve regurgitation is trivial. No evidence of tricuspid stenosis. Aortic Valve: The aortic valve is normal in structure. There is mild calcification of the aortic valve. Aortic valve regurgitation is mild. No aortic stenosis is present. Pulmonic Valve: The pulmonic valve was normal in structure. Pulmonic valve regurgitation is trivial. No evidence of pulmonic stenosis. Aorta: The aortic root is normal in size and structure. Venous: The inferior vena cava is normal in size with greater than 50% respiratory variability, suggesting right atrial pressure of 3 mmHg. IAS/Shunts: No atrial level shunt detected by color flow Doppler.  LEFT VENTRICLE PLAX 2D LVIDd:         4.80 cm      Diastology LVIDs:         3.20 cm      LV e' medial:    5.44 cm/s LV PW:         1.40 cm      LV E/e' medial:  7.9 LV IVS:        1.10 cm      LV e' lateral:   5.55 cm/s LVOT diam:     2.00 cm      LV E/e' lateral: 7.8 LV SV:         55 LV SV Index:   23           2D Longitudinal Strain LVOT Area:     3.14 cm     2D Strain GLS Avg:     -20.5 %  LV Volumes (MOD) LV vol d, MOD A2C: 104.0 ml 3D Volume EF: LV vol d, MOD A4C: 67.9 ml  3D EF:  60 % LV vol s, MOD A2C: 48.8 ml  LV EDV:       148 ml LV vol s, MOD A4C: 17.8 ml  LV ESV:       60 ml LV SV MOD A2C:     55.2 ml  LV SV:        88 ml LV SV MOD A4C:     67.9 ml LV SV MOD BP:      53.3 ml RIGHT VENTRICLE             IVC RV S prime:     12.60 cm/s  IVC diam: 1.50 cm TAPSE (M-mode): 2.0 cm LEFT ATRIUM             Index       RIGHT ATRIUM           Index LA diam:        3.90 cm 1.66 cm/m  RA Area:     16.40 cm LA Vol (A2C):   32.7 ml 13.95 ml/m RA Volume:   41.40 ml  17.66 ml/m LA Vol (A4C):   30.2 ml 12.88 ml/m LA Biplane Vol: 31.3 ml 13.35 ml/m  AORTIC VALVE LVOT Vmax:   91.65 cm/s LVOT Vmean:  59.200 cm/s LVOT VTI:    0.174 m   AORTA Ao Root diam: 3.90 cm Ao Asc diam:  3.60 cm MITRAL VALVE MV Area (PHT): 2.91 cm    SHUNTS MV Decel Time: 261 msec    Systemic VTI:  0.17 m MV E velocity: 43.20 cm/s  Systemic Diam: 2.00 cm MV A velocity: 64.60 cm/s MV E/A ratio:  0.67 Glori Bickers MD Electronically signed by Glori Bickers MD Signature Date/Time: 02/22/2021/1:46:14 PM    Final    IR IMAGING GUIDED PORT INSERTION  Result Date: 02/12/2021 INDICATION: 70 year old with metastatic esophageal cancer. Port-A-Cath needed for treatment. EXAM: FLUOROSCOPIC AND ULTRASOUND GUIDED PLACEMENT OF A SUBCUTANEOUS PORT COMPARISON:  None. MEDICATIONS: Moderate sedation ANESTHESIA/SEDATION: Versed 2.0 mg IV; Fentanyl 100 mcg IV; Moderate Sedation Time:  28 The patient was continuously monitored during the procedure by the interventional radiology nurse under my direct supervision. FLUOROSCOPY TIME:  42 seconds, 5 mGy COMPLICATIONS: None immediate. PROCEDURE: The procedure, risks, benefits, and alternatives were explained to the patient. Questions regarding the procedure were encouraged and answered. The patient understands and consents to the procedure. Patient was placed supine on the interventional table. Ultrasound confirmed a patent right internal jugular vein. Ultrasound image was saved for documentation. The right chest and neck were cleaned with a skin antiseptic and a sterile drape was placed. Maximal barrier sterile technique was utilized including caps, mask, sterile gowns, sterile gloves, sterile drape, hand hygiene and skin antiseptic. The right neck was anesthetized with 1% lidocaine. Small incision was made in the right neck with a blade. Micropuncture set was placed in the right internal jugular vein with ultrasound guidance. The micropuncture wire was used for measurement purposes. The right chest was anesthetized with 1% lidocaine with epinephrine. #15 blade was used to make an incision and a subcutaneous port pocket was formed. Grimes was assembled. Subcutaneous tunnel was formed with a stiff tunneling device. The port catheter was brought through the subcutaneous tunnel. The port was placed in the subcutaneous pocket. The micropuncture set was exchanged for a peel-away sheath. The catheter was placed through the peel-away sheath and the tip was positioned at the superior cavoatrial junction. Catheter placement was confirmed with fluoroscopy. The port was  accessed and flushed with heparinized saline. The port pocket was closed using two layers of absorbable sutures and Dermabond. The vein skin site was closed using a single layer of absorbable suture and Dermabond. Sterile dressings were applied. Patient tolerated the procedure well without an immediate complication. Ultrasound and fluoroscopic images were taken and saved for this procedure. IMPRESSION: Placement of a subcutaneous power-injectable port device. Catheter tip at the superior cavoatrial junction. Electronically Signed   By: Markus Daft M.D.   On: 02/12/2021 18:17    Impression/Plan:   This is a very nice gentleman status post stereotactic radiosurgery to 2 brain metastases.  The dominant metastasis was a 2 cm mass in the left parietal lobe.   He completed treatment 3 weeks ago and came in a bit prematurely for follow-up due to new neurologic issues last week.  We reversed his steroid taper with some improvement in his symptoms.  He noted improvement in his ability to write as well as improvement in his cognition.  However he does report that he continues to have worsening weakness in his right leg.  Of note, he has a history of diabetic neuropathy and hip/spine degenerative disease / past spine surgery and while part of the issues with his right leg may be due to chronic spine issues  I think it's mostly due to peritumoral edema.  I'll keep him on 53m dexamethasone BID for now.  Dr. VMickeal Skinneris graciously working him into his clinic tomorrow for neurologic  consultation and symptom management and steroid taper moving forward. SMont Duttonwill navigate his subsequent brain MRI based on Dr. VRenda Rollsrecommendations and I will see the patient back as needed as we will continue to monitor him as a team in the future. Dr FBurr Medicois holding his immunotherapy until his steroid taper is complete.  We will also arrange homehealth to visit him for a PT OT consult given mobility issues.  Refill on dexamethasone placed and prescription for lidocaine mouthwash placed given his mucositis which is likely related to systemic therapy/oxaliplatin.  On date of service, in total, I spent 30 minutes on this encounter. Patient was seen in person.  _____________________________________   SEppie Gibson MD

## 2021-03-05 NOTE — Progress Notes (Signed)
Mr. Baines presents today for follow-up after completing single SRS treatment to his brain on 02/09/2021  Dose of Decadron, if applicable: 4 mg PO twice a day  Recent neurologic symptoms, if any:  Seizures: Patient denies Headaches: Patient denies Nausea: Patient denies--reports a stable appetite Dizziness/ataxia: Yes--he reports feeling dizzy and unsteady on his feet. Using a cane for additional balance support Difficulty with hand coordination: Reports this has improved. States his handwriting is legible once again Focal numbness/weakness: Reports pain and weakness to his right thigh. States it is difficult to walk up stairs Visual deficits/changes: Patient denies Confusion/Memory deficits: Reports this has improved. No longer feels he has any cognitive deficits   Additional Complaints / other details: Reports he's not sleeping well. Reports an increase in urinary frequency and difficulty emptying his bladder fully. States he saw his urologist last week and was started on a new medication to help symptoms. Denies any issues with constipation and reports regular BMs. Reports soreness/sensitivity to his mouth that he started noticing about 4 days ago. f

## 2021-03-05 NOTE — Telephone Encounter (Signed)
Last OV 01/30/2021 dx neoplasm of brain Last Refill 01/29/2021

## 2021-03-06 ENCOUNTER — Other Ambulatory Visit: Payer: Self-pay

## 2021-03-06 ENCOUNTER — Other Ambulatory Visit (HOSPITAL_COMMUNITY): Payer: Self-pay

## 2021-03-06 ENCOUNTER — Encounter: Payer: Self-pay | Admitting: Pulmonary Disease

## 2021-03-06 ENCOUNTER — Other Ambulatory Visit: Payer: Self-pay | Admitting: Family Medicine

## 2021-03-06 ENCOUNTER — Ambulatory Visit (INDEPENDENT_AMBULATORY_CARE_PROVIDER_SITE_OTHER): Payer: Medicare Other | Admitting: Pulmonary Disease

## 2021-03-06 ENCOUNTER — Inpatient Hospital Stay: Payer: Medicare Other | Attending: Physician Assistant | Admitting: Internal Medicine

## 2021-03-06 VITALS — BP 124/74 | HR 85 | Temp 97.9°F | Resp 18 | Ht 74.0 in | Wt 226.4 lb

## 2021-03-06 VITALS — BP 124/64 | HR 89 | Ht 74.0 in | Wt 226.3 lb

## 2021-03-06 DIAGNOSIS — C155 Malignant neoplasm of lower third of esophagus: Secondary | ICD-10-CM | POA: Diagnosis not present

## 2021-03-06 DIAGNOSIS — J449 Chronic obstructive pulmonary disease, unspecified: Secondary | ICD-10-CM

## 2021-03-06 DIAGNOSIS — Z79899 Other long term (current) drug therapy: Secondary | ICD-10-CM | POA: Diagnosis not present

## 2021-03-06 DIAGNOSIS — Z5112 Encounter for antineoplastic immunotherapy: Secondary | ICD-10-CM | POA: Diagnosis not present

## 2021-03-06 DIAGNOSIS — J181 Lobar pneumonia, unspecified organism: Secondary | ICD-10-CM

## 2021-03-06 DIAGNOSIS — C7931 Secondary malignant neoplasm of brain: Secondary | ICD-10-CM | POA: Diagnosis not present

## 2021-03-06 DIAGNOSIS — I2510 Atherosclerotic heart disease of native coronary artery without angina pectoris: Secondary | ICD-10-CM | POA: Diagnosis not present

## 2021-03-06 DIAGNOSIS — Z23 Encounter for immunization: Secondary | ICD-10-CM | POA: Insufficient documentation

## 2021-03-06 DIAGNOSIS — Z87891 Personal history of nicotine dependence: Secondary | ICD-10-CM | POA: Insufficient documentation

## 2021-03-06 MED ORDER — ANORO ELLIPTA 62.5-25 MCG/INH IN AEPB: 1.0000 | INHALATION_SPRAY | Freq: Every day | RESPIRATORY_TRACT | 0 refills | Status: AC

## 2021-03-06 NOTE — Telephone Encounter (Signed)
Last refill: 01/29/21 #90, 0 Last OV:  01/11/21 dx. DM, HTN, chronic pain syndrome

## 2021-03-06 NOTE — Progress Notes (Signed)
[facility] [address]  [place], [place] [address] [phone]   New Patient Evaluation  Date of Service: 03/06/21 Patient Name: Jason Snyder Patient MRN: [id number] Patient DOB: Dec 04, 1950 Provider: Henreitta Leber, MD  Identifying Statement:  Jason Snyder is a 70 y.o. male with Brain metastases [facility]) - Plan: MR BRAIN W WO CONTRAST who presents for initial consultation and evaluation regarding cancer associated neurologic deficits.    Referring Provider: Lonie Peak, MD [address] [place],  [place] [address]  Primary Cancer:  Oncologic History: Oncology History  Esophageal cancer (HCC)  01/22/2021 Imaging   CT of the Head  IMPRESSION: Left parietal mass with extensive vasogenic edema most concerning for a metastasis. Brain MRI without and with contrast is recommended for further evaluation.     01/22/2021 Imaging   CT CHEST, ABDOMEN, AND PELVIS  IMPRESSION: 1. Indeterminate hypodensity within the right lobe liver abutting the gallbladder fossa, concerning for neoplasm given intracranial findings. If tissue diagnosis is desired, image guided biopsy could be considered. Given the decreased conspicuity on delayed imaging, follow-up liver ultrasound with possible ultrasound-guided biopsy could be considered. Alternatively, dedicated liver MRI could be considered. 2. Enlarged prostate, with asymmetric decreased attenuation of the right peripheral zone of the prostate. Please correlate with serum PSA and physical exam findings. 3. Distended urinary bladder, which may reflect chronic bladder outlet obstruction given the enlarged prostate. 4. 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 5. No acute intrathoracic process. 6. Aortic Atherosclerosis (ICD10-I70.0). Severe atherosclerosis of the  bilateral common femoral arteries with greater than 90% stenosis.    01/22/2021 Imaging   BRAIN MRI W/WO CONTRAST   IMPRESSION: 1.9 cm mass within the left frontoparietal white matter with extensive surrounding edema. Favor metastatic disease over primary neoplasm.   01/26/2021 Pathology Results   CASE: [id number] A. ESOPHAGUS, DISTAL, BIOPSY:  - Adenocarcinoma, see comment.   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 Dr. Kenard Gower has reviewed the case.   01/26/2021 Procedure   Findings: A small hiatal hernia was present. The entire examined stomach was normal. The examined duodenum was normal. - Likely malignant esophageal tumor was found in the distal esophagus. Biopsied. - Small hiatal hernia. - Normal stomach.   01/26/2021 Cancer Staging   Staging form: Esophagus - Adenocarcinoma, AJCC 8th Edition - Clinical stage from 01/26/2021: Stage IVB (cTX, cNX, pM1) - Signed by Malachy Mood, MD on 02/02/2021 Stage prefix: Initial diagnosis   02/02/2021 Initial Diagnosis   Esophageal cancer (HCC)   02/14/2021 -  Chemotherapy   Patient is on Treatment Plan : GASTRIC CapeOx (1000/130) q21d x 8 Cycles      02/16/2021 - 02/16/2021 Chemotherapy      Patient is on Antibody Plan: HEAD/NECK PEMBROLIZUMAB Q21D     03/08/2021 -  Chemotherapy    Patient is on Treatment Plan: GASTRIC CAPEOX (1000/130) Q21D X 8 CYCLES    Patient is on Antibody Plan: KANJINTI AND PEMBROLIZUMAB Q21D      02/22/21: SRS to R frontal metastasis and small R occipital [facility] [place])    History of Present Illness: The patient's records from the referring physician were obtained and reviewed and the patient interviewed to confirm this HPI.  Jason Snyder presents today for evaluation for ongoing right sided weakness.  He describes right arm and leg weakness which began in late August  and led to uncovering of brain tumors from esophageal cancer.  Weakness led to inability to walk  independently, but this improved considerably immediately following radiation therapy on 9/22.  Starting last week (9/28) he developed worsening weakness of both the right arm and leg.  Dr. Isidore Moos started him on decadron $RemoveBef'4mg'mGpHWDKRYc$  twice per day (he had been on $Remov'2mg'MOpeJl$  twice per day prior to that); this led to improvement in the arm/hand but no change in the leg dysfunction.  Currently he describes "full strength" in the right arm and "needing cane or walker" when ambulating.  Has not started any physical therapy.  Medications: Current Outpatient Medications on File Prior to Visit  Medication Sig Dispense Refill   Accu-Chek Softclix Lancets lancets Use as directed up to 4 times daily 100 each 5   acetaminophen-codeine (TYLENOL #3) 300-30 MG tablet Take 1 tablet by mouth 2 (two) times daily as needed for moderate pain. 90 tablet 0   acetic acid 2 % otic solution Place 4 drops into both ears 2 (two) times daily as needed. (Patient taking differently: Place 4 drops into both ears 2 (two) times daily as needed (pain).) 15 mL 2   albuterol (VENTOLIN HFA) 108 (90 Base) MCG/ACT inhaler INHALE TWO PUFFS BY MOUTH INTO THE LUNGS EVERY FOUR HOURS AS NEEDED FOR WHEEZING 18 g 0   amLODipine (NORVASC) 10 MG tablet Take 1 tablet (10 mg total) by mouth daily. 90 tablet 0   aspirin 81 MG EC tablet Take 81 mg by mouth daily.      atorvastatin (LIPITOR) 20 MG tablet Take 1 tablet (20 mg total) by mouth at bedtime. 90 tablet 3   Blood Glucose Monitoring Suppl (BLOOD GLUCOSE MONITOR SYSTEM) w/Device KIT Please check blood glucose three times daily (before meals in the morning and 2 hours after largest meals of the day OR lunch and dinner) 1 kit 0   capecitabine (XELODA) 500 MG tablet Take 4 tablets (2,000 mg total) by mouth 2 (two) times daily. Take 14 days on, 7 days off, repeat every 21 days. 112 tablet 0   cyclobenzaprine (FLEXERIL) 10 MG tablet TAKE ONE TABLET BY MOUTH THREE TIMES DAILY AS NEEDED 90 tablet 0   dexamethasone  (DECADRON) 2 MG tablet Taper to 2 tablets ($RemoveBe'4mg'BZQoGIkrD$ ) twice daily until instructed otherwise. 60 tablet 0   diclofenac sodium (VOLTAREN) 1 % GEL Apply 2 g topically 4 (four) times daily as needed (pain).     FARXIGA 10 MG TABS tablet TAKE ONE TABLET BY MOUTH ONE TIME DAILY (Patient taking differently: Take 10 mg by mouth daily.) 90 tablet 3   finasteride (PROSCAR) 5 MG tablet Take 5 mg by mouth daily.     fluticasone (FLONASE) 50 MCG/ACT nasal spray Place 1 spray into the nose daily as needed for allergies.      gabapentin (NEURONTIN) 300 MG capsule Take 1 capsule (300 mg total) by mouth 3 (three) times daily. 180 capsule 3   glucose blood (ACCU-CHEK GUIDE) test strip Use as instructed up to 4 times daily 100 each 12   Ketotifen Fumarate (ALLERGY EYE DROPS OP) Place 1 drop into both eyes daily as needed (allergy).     lidocaine (XYLOCAINE) 2 % solution Mix 1 part 2% viscous lidocaine with 1 part water. Swish and swallow 46mL of diluted mixture --use 30 minutes before meals and at bedtime, up to 4 times a day 200 mL 0   lidocaine-prilocaine (EMLA) cream Apply 1 application topically as needed. 30 g 2  lidocaine-prilocaine (EMLA) cream Apply to affected area once 30 g 3   Loratadine 10 MG CAPS Take 10 mg by mouth daily as needed (allergies).      metFORMIN (GLUCOPHAGE) 500 MG tablet Take 1 tablet (500 mg total) by mouth 2 (two) times daily with a meal. 180 tablet 0   metoprolol tartrate (LOPRESSOR) 50 MG tablet Take 1 tablet (50 mg total) by mouth 2 (two) times daily. 180 tablet 3   Multiple Vitamin (MULTIVITAMIN) capsule Take 1 capsule by mouth daily.      nitroGLYCERIN (NITROSTAT) 0.4 MG SL tablet Place 1 tablet (0.4 mg total) under the tongue every 5 (five) minutes as needed for chest pain. 25 tablet 0   ondansetron (ZOFRAN) 8 MG tablet Take 1 tablet (8 mg total) by mouth every 8 (eight) hours as needed for nausea or vomiting. 30 tablet 2   ondansetron (ZOFRAN) 8 MG tablet Take 1 tablet (8 mg total) by  mouth 2 (two) times daily as needed for refractory nausea / vomiting. Start on day 3 after chemotherapy. 30 tablet 1   pantoprazole (PROTONIX) 40 MG tablet Take 1 tablet (40 mg total) by mouth daily. 30 tablet 2   prochlorperazine (COMPAZINE) 10 MG tablet Take 1 tablet (10 mg total) by mouth every 6 (six) hours as needed. 30 tablet 2   prochlorperazine (COMPAZINE) 10 MG tablet Take 1 tablet (10 mg total) by mouth every 6 (six) hours as needed (Nausea or vomiting). 30 tablet 1   Spacer/Aero-Holding Chambers (AEROCHAMBER MV) inhaler Use as instructed 1 each 0   tamsulosin (FLOMAX) 0.4 MG CAPS capsule TAKE TWO CAPSULES BY MOUTH DAILY (Patient taking differently: Take 0.4 mg by mouth daily.) 180 capsule 0   Tiotropium Bromide-Olodaterol (STIOLTO RESPIMAT) 2.5-2.5 MCG/ACT AERS Inhale 2 puffs into the lungs daily. 4 g 11   No current facility-administered medications on file prior to visit.    Allergies:  Allergies  Allergen Reactions   Rubus Fruticosus Rash    Blackberries   Past Medical History:  Past Medical History:  Diagnosis Date   BMI 30.0-30.9,adult    BPH without obstruction/lower urinary tract symptoms 10/03/2017   CAD (coronary artery disease)    cath 2020, single vessel disease, medical mgt   Chronic left shoulder pain c   Chronic low back pain with right-sided sciatica    Chronic pain 10/07/2013   Combined hyperlipidemia associated with type 2 diabetes mellitus (HCC) 07/09/2019   Controlled type 2 diabetes mellitus without complication, without long-term current use of insulin (HCC) 07/12/2014   COPD (chronic obstructive pulmonary disease) (HCC) 01/07/2014   PFTs 07/2018 minimal airway obstruction, overinflation and no response to bronchodilators.    Coronary artery disease involving native coronary artery of native heart without angina pectoris    Cardiac Cath 01/2019: Severe single-vessel disease with additional moderate two-vessel disease: 100% CTO of RCA, moderate disease in  proximal LCx, distal LCx and very small caliber 1st Diag DFR-FFR negative lesion in proximal LCx Normal LV pressures with previously document, ed normal EF.   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   Hypogonadism - pineal glad abnormalities On testosterone replacement   Obesity (BMI 30-39.9)    Obesity (BMI 30.0-34.9) 10/03/2017   OSA on CPAP 10/07/2013   Primary osteoarthritis involving multiple joints 01/10/2015   Rosacea    Smoking greater than 30 pack years - quit 03/2018 10/03/2017   Thoracic and lumbosacral neuritis    Type 2 diabetes mellitus with peripheral neuropathy (HCC) 07/12/2014   Past Surgical History:  Past Surgical History:  Procedure Laterality Date   ARTHROSCOPIC REPAIR ACL     BIOPSY  01/26/2021   Procedure: BIOPSY;  Surgeon: Tressia Danas, MD;  Location[facility] ENDOSCOPY;  Service: Gastroenterology;;   CARDIAC CATHETERIZATION     ESOPHAGOGASTRODUODENOSCOPY (EGD) WITH PROPOFOL N/A 01/26/2021   Procedure: ESOPHAGOGASTRODUODENOSCOPY (EGD) WITH PROPOFOL;  Surgeon: Tressia Danas, MD;  Location: [facility] ENDOSCOPY;  Service: Gastroenterology;  Laterality: N/A;   INTRAVASCULAR PRESSURE WIRE/FFR STUDY N/A 01/01/2019   Procedure: INTRAVASCULAR PRESSURE WIRE/FFR STUDY;  Surgeon: Marykay Lex, MD;  Location: [facility] INVASIVE CV LAB;  Service: Cardiovascular;  Laterality: N/A;   IR IMAGING GUIDED PORT INSERTION  02/12/2021   LEFT HEART CATH AND CORONARY ANGIOGRAPHY N/A 01/01/2019   Procedure: LEFT HEART CATH AND CORONARY ANGIOGRAPHY;  Surgeon:  Marykay Lex, MD;  Location: [facility] INVASIVE CV LAB;  Service: Cardiovascular;  Laterality: N/A;   PROSTATE CRYOABLATION     03/2019   SHOULDER ARTHROSCOPY     SPINE SURGERY     Social History:  Social History   Socioeconomic History   Marital status: Married    Spouse name: Not on file   Number of children: Not on file   Years of education: Not on file   Highest education level: Not on file  Occupational History   Occupation: retired  Tobacco Use   Smoking status: Former    Packs/day: 0.50    Years: 30.00    Pack years: 15.00    Types: Cigarettes    Quit date: 03/05/2018    Years since quitting: 3.0   Smokeless tobacco: Never   Tobacco comments:    no cigarettes since 03/05/18  Vaping Use   Vaping Use: Never used  Substance and Sexual Activity   Alcohol use: Not Currently   Drug use: Not Currently   Sexual activity: Not Currently  Other Topics Concern   Not on file  Social History Narrative   Not on file   Social Determinants of Health   Financial Resource Strain: Low Risk    Difficulty of Paying Living Expenses: Not hard at all  Food Insecurity: No Food Insecurity   Worried About Programme researcher, broadcasting/film/video in the Last Year: Never true   Ran Out of Food in the Last Year: Never true  Transportation Needs: No Transportation Needs   Lack of Transportation (Medical): No   Lack of Transportation (Non-Medical): No  Physical Activity: Insufficiently Active   Days of Exercise per Week: 7 days   Minutes of Exercise per Session: 20 min  Stress: No Stress Concern Present   Feeling of Stress : Not at all  Social Connections: Moderately Isolated   Frequency of Communication with Friends and Family: More than three times a week   Frequency of Social Gatherings with Friends and Family: Once a week   Attends Religious Services: Never   Database administrator or Organizations: No   Attends Banker Meetings: Never   Marital Status: Married  Catering manager Violence:  Not At Risk  Fear of Current or Ex-Partner: No   Emotionally Abused: No   Physically Abused: No   Sexually Abused: No   Family History:  Family History  Problem Relation Age of Onset   Pulmonary embolism Mother    Heart disease Father    Lung cancer Father    Lung cancer Sister    Heart disease Maternal Uncle     Review of Systems: Constitutional: Doesn't report fevers, chills or abnormal weight loss Eyes: Doesn't report blurriness of vision Ears, nose, mouth, throat, and face: Doesn't report sore throat Respiratory: Doesn't report cough, dyspnea or wheezes Cardiovascular: Doesn't report palpitation, chest discomfort  Gastrointestinal:  Doesn't report nausea, constipation, diarrhea GU: Doesn't report incontinence Skin: Doesn't report skin rashes Neurological: Per HPI Musculoskeletal: Doesn't report joint pain Behavioral/Psych: Doesn't report anxiety  Physical Exam: Vitals:   03/06/21 0917  BP: 124/74  Pulse: 85  Resp: 18  Temp: 97.9 F (36.6 C)  SpO2: 100%   KPS: 70. General: Alert, cooperative, pleasant, in no acute distress Head: Normal EENT: No conjunctival injection or scleral icterus.  Lungs: Resp effort normal Cardiac: Regular rate Abdomen: Non-distended abdomen Skin: No rashes cyanosis or petechiae. Extremities: No clubbing or edema  Neurologic Exam: Mental Status: Awake, alert, attentive to examiner. Oriented to self and environment. Language is fluent with intact comprehension.  Cranial Nerves: Visual acuity is grossly normal. Visual fields are full. Extra-ocular movements intact. No ptosis. Face is symmetric Motor: Tone and bulk are normal. Power is 4/5 in right leg, throughout.  5/5 in right arm and on left side. Reflexes are symmetric, no pathologic reflexes present.  Sensory: Intact to light touch Gait: Hemiparetic   Labs: I have reviewed the data as listed    Component Value Date/Time   NA 139 02/13/2021 1032   NA 135 12/24/2018 0945   K  4.4 02/13/2021 1032   CL 103 02/13/2021 1032   CO2 24 02/13/2021 1032   GLUCOSE 181 (H) 02/13/2021 1032   BUN 20 02/13/2021 1032   BUN 10 12/24/2018 0945   CREATININE 0.91 02/13/2021 1032   CREATININE 1.02 04/11/2020 0824   CALCIUM 8.8 (L) 02/13/2021 1032   PROT 6.2 (L) 02/13/2021 1032   ALBUMIN 3.8 02/13/2021 1032   AST 14 (L) 02/13/2021 1032   ALT 55 (H) 02/13/2021 1032   ALKPHOS 83 02/13/2021 1032   BILITOT 0.8 02/13/2021 1032   GFRNONAA >60 02/13/2021 1032   GFRNONAA 76 04/11/2020 0824   GFRAA 88 04/11/2020 0824   Lab Results  Component Value Date   WBC 9.6 02/13/2021   NEUTROABS 8.3 (H) 02/13/2021   HGB 15.8 02/13/2021   HCT 46.6 02/13/2021   MCV 93.4 02/13/2021   PLT 204 02/13/2021    Imaging:  CT Chest Wo Contrast  Result Date: 02/20/2021 CLINICAL DATA:  COPD exacerbation. Esophageal cancer with metastases. EXAM: CT CHEST WITHOUT CONTRAST TECHNIQUE: Multidetector CT imaging of the chest was performed following the standard protocol without IV contrast. COMPARISON:  11/03/2020 FINDINGS: Cardiovascular: Tiny anterior pericardial effusion. Coronary artery calcification is evident. Mild atherosclerotic calcification is noted in the wall of the thoracic aorta. Right Port-A-Cath tip is positioned in the distal SVC. Mediastinum/Nodes: No mediastinal lymphadenopathy by size criteria. 8 mm low paraesophageal node on 23/2 is stable. No evidence for gross hilar lymphadenopathy although assessment is limited by the lack of intravenous contrast on the current study. Probable mild wall thickening distal esophagus. There is no axillary lymphadenopathy. Lungs/Pleura: No suspicious pulmonary nodule or mass. No focal  airspace consolidation. No pleural effusion. Mild bronchial wall thickening. Upper Abdomen: The liver shows diffusely decreased attenuation suggesting fat deposition. Stable 11 mm hypodensity towards the hepatic dome. Musculoskeletal: No worrisome lytic or sclerotic osseous  abnormality. IMPRESSION: 1. No acute findings in the chest. Specifically, no findings to explain the patient's history of shortness of breath. 2. Probable mild wall thickening distal esophagus. 3. Hepatic steatosis with stable hypodensity towards the hepatic dome. Recent MRI characterized multiple hepatic lesions suspicious for metastatic disease. 4. Aortic Atherosclerosis (ICD10-I70.0). Electronically Signed   By: Misty Stanley M.D.   On: 02/20/2021 13:01   ECHOCARDIOGRAM COMPLETE  Result Date: 02/22/2021    ECHOCARDIOGRAM REPORT   Patient Name:   Jason Snyder Date of Exam: 02/22/2021 Medical Rec #:  [id number]   Height:       74.0 in Accession #:    [id number]  Weight:       239.0 lb Date of Birth:  26-Nov-1950   BSA:          2.344 m Patient Age:    69 years    BP:           160/90 mmHg Patient Gender: M           HR:           71 bpm. Exam Location:  Outpatient Procedure: 2D Echo, 3D Echo, Cardiac Doppler, Color Doppler and Strain Analysis Indications:    Z51.11 Encounter for antineoplastic chemotheraphy  History:        Patient has prior history of Echocardiogram examinations, most                 recent 12/09/2018. CAD, COPD; Risk Factors:Hypertension, Diabetes                 and Dyslipidemia. Metastatic cancer.  Sonographer:    Roseanna Rainbow RDCS Referring Phys: [id number] [facility]  Sonographer Comments: Technically difficult study due to poor echo windows. Global longitudinal strain was attempted. IMPRESSIONS  1. Left ventricular ejection fraction, by estimation, is 60 to 65%. The left ventricle has normal function. The left ventricle has no regional wall motion abnormalities. There is mild left ventricular hypertrophy. Left ventricular diastolic parameters are consistent with Grade I diastolic dysfunction (impaired relaxation). The average left ventricular global longitudinal strain is -20.5 %.  2. Right ventricular systolic function is normal. The right ventricular size is normal.  3. The mitral valve is normal in  structure. Trivial mitral valve regurgitation. No evidence of mitral stenosis.  4. The aortic valve is normal in structure. There is mild calcification of the aortic valve. Aortic valve regurgitation is mild. No aortic stenosis is present.  5. The inferior vena cava is normal in size with greater than 50% respiratory variability, suggesting right atrial pressure of 3 mmHg. FINDINGS  Left Ventricle: Left ventricular ejection fraction, by estimation, is 60 to 65%. The left ventricle has normal function. The left ventricle has no regional wall motion abnormalities. The average left ventricular global longitudinal strain is -20.5 %. The left ventricular internal cavity size was normal in size. There is mild left ventricular hypertrophy. Left ventricular diastolic parameters are consistent with Grade I diastolic dysfunction (impaired relaxation). Right Ventricle: The right ventricular size is normal. No increase in right ventricular wall thickness. Right ventricular systolic function is normal. Left Atrium: Left atrial size was normal in size. Right Atrium: Right atrial size was normal in size. Pericardium: There is no evidence of pericardial effusion. Mitral Valve: The mitral  valve is normal in structure. Trivial mitral valve regurgitation. No evidence of mitral valve stenosis. Tricuspid Valve: The tricuspid valve is normal in structure. Tricuspid valve regurgitation is trivial. No evidence of tricuspid stenosis. Aortic Valve: The aortic valve is normal in structure. There is mild calcification of the aortic valve. Aortic valve regurgitation is mild. No aortic stenosis is present. Pulmonic Valve: The pulmonic valve was normal in structure. Pulmonic valve regurgitation is trivial. No evidence of pulmonic stenosis. Aorta: The aortic root is normal in size and structure. Venous: The inferior vena cava is normal in size with greater than 50% respiratory variability, suggesting right atrial pressure of 3 mmHg. IAS/Shunts: No  atrial level shunt detected by color flow Doppler.  LEFT VENTRICLE PLAX 2D LVIDd:         4.80 cm      Diastology LVIDs:         3.20 cm      LV e' medial:    5.44 cm/s LV PW:         1.40 cm      LV E/e' medial:  7.9 LV IVS:        1.10 cm      LV e' lateral:   5.55 cm/s LVOT diam:     2.00 cm      LV E/e' lateral: 7.8 LV SV:         55 LV SV Index:   23           2D Longitudinal Strain LVOT Area:     3.14 cm     2D Strain GLS Avg:     -20.5 %  LV Volumes (MOD) LV vol d, MOD A2C: 104.0 ml 3D Volume EF: LV vol d, MOD A4C: 67.9 ml  3D EF:        60 % LV vol s, MOD A2C: 48.8 ml  LV EDV:       148 ml LV vol s, MOD A4C: 17.8 ml  LV ESV:       60 ml LV SV MOD A2C:     55.2 ml  LV SV:        88 ml LV SV MOD A4C:     67.9 ml LV SV MOD BP:      53.3 ml RIGHT VENTRICLE             IVC RV S prime:     12.60 cm/s  IVC diam: 1.50 cm TAPSE (M-mode): 2.0 cm LEFT ATRIUM             Index       RIGHT ATRIUM           Index LA diam:        3.90 cm 1.66 cm/m  RA Area:     16.40 cm LA Vol (A2C):   32.7 ml 13.95 ml/m RA Volume:   41.40 ml  17.66 ml/m LA Vol (A4C):   30.2 ml 12.88 ml/m LA Biplane Vol: 31.3 ml 13.35 ml/m  AORTIC VALVE LVOT Vmax:   91.65 cm/s LVOT Vmean:  59.200 cm/s LVOT VTI:    0.174 m  AORTA Ao Root diam: 3.90 cm Ao Asc diam:  3.60 cm MITRAL VALVE MV Area (PHT): 2.91 cm    SHUNTS MV Decel Time: 261 msec    Systemic VTI:  0.17 m MV E velocity: 43.20 cm/s  Systemic Diam: 2.00 cm MV A velocity: 64.60 cm/s MV E/A ratio:  0.67 Glori Bickers MD Electronically  signed by Glori Bickers MD Signature Date/Time: 02/22/2021/1:46:14 PM    Final    IR IMAGING GUIDED PORT INSERTION  Result Date: 02/12/2021 INDICATION: 70 year old with metastatic esophageal cancer. Port-A-Cath needed for treatment. EXAM: FLUOROSCOPIC AND ULTRASOUND GUIDED PLACEMENT OF A SUBCUTANEOUS PORT COMPARISON:  None. MEDICATIONS: Moderate sedation ANESTHESIA/SEDATION: Versed 2.0 mg IV; Fentanyl 100 mcg IV; Moderate Sedation Time:  28 The patient  was continuously monitored during the procedure by the interventional radiology nurse under my direct supervision. FLUOROSCOPY TIME:  42 seconds, 5 mGy COMPLICATIONS: None immediate. PROCEDURE: The procedure, risks, benefits, and alternatives were explained to the patient. Questions regarding the procedure were encouraged and answered. The patient understands and consents to the procedure. Patient was placed supine on the interventional table. Ultrasound confirmed a patent right internal jugular vein. Ultrasound image was saved for documentation. The right chest and neck were cleaned with a skin antiseptic and a sterile drape was placed. Maximal barrier sterile technique was utilized including caps, mask, sterile gowns, sterile gloves, sterile drape, hand hygiene and skin antiseptic. The right neck was anesthetized with 1% lidocaine. Small incision was made in the right neck with a blade. Micropuncture set was placed in the right internal jugular vein with ultrasound guidance. The micropuncture wire was used for measurement purposes. The right chest was anesthetized with 1% lidocaine with epinephrine. #15 blade was used to make an incision and a subcutaneous port pocket was formed. Venturia was assembled. Subcutaneous tunnel was formed with a stiff tunneling device. The port catheter was brought through the subcutaneous tunnel. The port was placed in the subcutaneous pocket. The micropuncture set was exchanged for a peel-away sheath. The catheter was placed through the peel-away sheath and the tip was positioned at the superior cavoatrial junction. Catheter placement was confirmed with fluoroscopy. The port was accessed and flushed with heparinized saline. The port pocket was closed using two layers of absorbable sutures and Dermabond. The vein skin site was closed using a single layer of absorbable suture and Dermabond. Sterile dressings were applied. Patient tolerated the procedure well without an  immediate complication. Ultrasound and fluoroscopic images were taken and saved for this procedure. IMPRESSION: Placement of a subcutaneous power-injectable port device. Catheter tip at the superior cavoatrial junction. Electronically Signed   By: Markus Daft M.D.   On: 02/12/2021 18:17     Assessment/Plan Brain metastases [facility]) - Plan: MR BRAIN W WO CONTRAST  Jason Snyder presents with clinical and radiographic syndrome localizing to the right pre-central gyrus.  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  Higher dose of dexamethasone was modestly helpful with the arm/hand weakness, but he reports zero improvement in leg dysfunction.  Continues to rely on cane and walker, despite initial improvement to baseline strength immediately following RT.  We recommended obtaining a brain MRI study to better clarify clinical situation.  He could have inflammation which is refractory to corticosteroids, in which case avastin could be considered.  Alternately, he could have good response radiographically to steroids, which would suggest more consolidated deficits which would be treatable mainly with aggressive physical and occupational therapy.  We will scheduled his scan and call him with the results, after review in brain/spine tumor board meeting.   In the interim, he can decrease decadron to $RemoveBef'4mg'ilQITnljsR$  daily, as long as motor function does not decline further on this dose.  We spent twenty additional minutes teaching regarding the natural history, biology, and historical experience in  the treatment of neurologic complications of cancer.   We appreciate the opportunity to participate in the care of Jason Snyder.   All questions were answered. The patient knows to call the clinic with any problems, questions or concerns. No barriers to learning were detected.  The total time spent in the encounter was 40 minutes and more than 50% was on  counseling and review of test results   Ventura Sellers, MD Medical Director of Neuro-Oncology [facility] [place] 03/06/21 10:16 AM

## 2021-03-06 NOTE — Patient Instructions (Signed)
We will change the stiolto to anoro I have reviewed her CT which shows no acute lung issues  Follow-up in 6 months.

## 2021-03-06 NOTE — Progress Notes (Signed)
Jason Snyder    [id number]    16-Mar-1951  Primary Care Physician:Andy, Karie Fetch, MD  Referring Physician: [name], [address],  [place] [address]  Chief complaint: Follow up for COPD, OSA  HPI: 70 year old ex-smoker with history of diabetes, hyperlipidemia, hypertension.  Here for evaluation of COPD. He had recent PFTs that showed mild obstructive airway changes with air trapping Chief complaint is dyspnea on exertion.  He has occasional cough with white mucus.  No wheezing He quit smoking in October 2019 and reports that overall his breathing has improved since then though he is gained some weight.   Initially on Atrovent. Started on [place] in 2020  Low-dose screening CT in early June 2022 showed right middle lobe infiltrate.  He was prescribed doxycycline and prednisone by our office.  Pets: Dog, no cats, birds, farm animals Occupation: Retired Environmental manager facility Exposures: No known exposures, no mold, hot tub, Jacuzzi Smoking history: 50-75-pack-year smoker.  Quit in October 2019 Travel history: Originally from [place].  He had lived in [place] most of his life.  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  He is under the care of oncology. Continues on stiolto. States that stiolto also hoarseness and would like to try an alternative inhaler.  Outpatient Encounter Medications as of 03/06/2021  Medication Sig   Accu-Chek Softclix Lancets lancets Use as directed up to 4 times daily   acetaminophen-codeine (TYLENOL #3) 300-30 MG tablet Take 1 tablet by mouth 2 (two) times daily as needed for moderate pain.   acetic acid 2 % otic solution Place 4 drops into both ears 2 (two) times daily as needed. (Patient taking differently: Place 4 drops into both ears 2 (two) times daily  as needed (pain).)   albuterol (VENTOLIN HFA) 108 (90 Base) MCG/ACT inhaler INHALE TWO PUFFS BY MOUTH INTO THE LUNGS EVERY FOUR HOURS AS NEEDED FOR WHEEZING   amLODipine (NORVASC) 10 MG tablet Take 1 tablet (10 mg total) by mouth daily.   aspirin 81 MG EC tablet Take 81 mg by mouth daily.    atorvastatin (LIPITOR) 20 MG tablet Take 1 tablet (20 mg total) by mouth at bedtime.   Blood Glucose Monitoring Suppl (BLOOD GLUCOSE MONITOR SYSTEM) w/Device KIT Please check blood glucose three times daily (before meals in the morning and 2 hours after largest meals of the day OR lunch and dinner)   capecitabine (XELODA) 500 MG tablet Take 4 tablets (2,000 mg total) by mouth 2 (two) times daily. Take 14 days on, 7 days off, repeat every 21 days.   cyclobenzaprine (FLEXERIL) 10 MG tablet TAKE ONE TABLET BY MOUTH THREE TIMES DAILY AS NEEDED   dexamethasone (DECADRON) 2 MG tablet Taper to 2 tablets ($RemoveBe'4mg'nPcVWkKaK$ ) twice daily until instructed otherwise.   diclofenac sodium (VOLTAREN) 1 % GEL Apply 2 g topically 4 (four) times daily as needed (pain).   FARXIGA 10 MG TABS tablet TAKE ONE TABLET BY MOUTH ONE TIME DAILY (Patient taking differently: Take 10 mg by mouth daily.)   finasteride (PROSCAR) 5 MG tablet Take 5 mg by mouth daily.   fluticasone (FLONASE) 50 MCG/ACT nasal spray Place 1 spray into the nose daily as needed for allergies.    gabapentin (NEURONTIN) 300 MG capsule Take 1 capsule (300 mg total) by mouth  3 (three) times daily.   glucose blood (ACCU-CHEK GUIDE) test strip Use as instructed up to 4 times daily   Ketotifen Fumarate (ALLERGY EYE DROPS OP) Place 1 drop into both eyes daily as needed (allergy).   lidocaine (XYLOCAINE) 2 % solution Mix 1 part 2% viscous lidocaine with 1 part water. Swish and swallow 65mL of diluted mixture --use 30 minutes before meals and at bedtime, up to 4 times a day   lidocaine-prilocaine (EMLA) cream Apply 1 application topically as needed.   lidocaine-prilocaine (EMLA) cream  Apply to affected area once   Loratadine 10 MG CAPS Take 10 mg by mouth daily as needed (allergies).    metFORMIN (GLUCOPHAGE) 500 MG tablet Take 1 tablet (500 mg total) by mouth 2 (two) times daily with a meal.   metoprolol tartrate (LOPRESSOR) 50 MG tablet Take 1 tablet (50 mg total) by mouth 2 (two) times daily.   Multiple Vitamin (MULTIVITAMIN) capsule Take 1 capsule by mouth daily.    nitroGLYCERIN (NITROSTAT) 0.4 MG SL tablet Place 1 tablet (0.4 mg total) under the tongue every 5 (five) minutes as needed for chest pain.   ondansetron (ZOFRAN) 8 MG tablet Take 1 tablet (8 mg total) by mouth every 8 (eight) hours as needed for nausea or vomiting.   ondansetron (ZOFRAN) 8 MG tablet Take 1 tablet (8 mg total) by mouth 2 (two) times daily as needed for refractory nausea / vomiting. Start on day 3 after chemotherapy.   pantoprazole (PROTONIX) 40 MG tablet Take 1 tablet (40 mg total) by mouth daily.   prochlorperazine (COMPAZINE) 10 MG tablet Take 1 tablet (10 mg total) by mouth every 6 (six) hours as needed.   prochlorperazine (COMPAZINE) 10 MG tablet Take 1 tablet (10 mg total) by mouth every 6 (six) hours as needed (Nausea or vomiting).   Spacer/Aero-Holding Chambers (AEROCHAMBER MV) inhaler Use as instructed   tamsulosin (FLOMAX) 0.4 MG CAPS capsule TAKE TWO CAPSULES BY MOUTH DAILY (Patient taking differently: Take 0.4 mg by mouth daily.)   Tiotropium Bromide-Olodaterol (STIOLTO RESPIMAT) 2.5-2.5 MCG/ACT AERS Inhale 2 puffs into the lungs daily.   No facility-administered encounter medications on file as of 03/06/2021.   Physical Exam: Blood pressure 124/64, pulse 89, height $RemoveBe'6\' 2"'LnKXIJJir$  (1.88 m), weight 226 lb 4.8 oz (102.6 kg), SpO2 97 %. Gen:      No acute distress HEENT:  EOMI, sclera anicteric Neck:     No masses; no thyromegaly Lungs:    Clear to auscultation bilaterally; normal respiratory effort CV:         Regular rate and rhythm; no murmurs Abd:      + bowel sounds; soft, non-tender; no  palpable masses, no distension Ext:    No edema; adequate peripheral perfusion Skin:      Warm and dry; no rash Neuro: alert and oriented x 3 Psych: normal mood and affect   Data Reviewed: Imaging: Screening CT chest 11/03/2019-  Centrilobular emphysema, tiny calcified granuloma with no change. Screening CT chest 11/03/2020-emphysema, patchy groundglass opacity in the right middle lobe. Chest x-ray 11/30/2020-improved ventilation in the right middle lobe. CT chest 02/20/2021-no acute findings in the chest, esophageal wall thickening I have reviewed the images personally.  PFTs: 07/14/2018 FVC 4.86 [98%], FEV1 3.31 [89%], F/F 68, TLC 123%, DLCO 111% Mild obstruction with overinflation and air trapping.  Labs: CBC 04/06/2018-WBC 7.8, eos 2.1%, absolute eosinophil count 164  Assessment:  Mild COPD Change stiolto to anoro as the former is causing hoarseness  Right middle lobe pneumonia Treated with antibiotics and prednisone earlier this year.  Follow-up CT shows no acute lung abnormality Flutter valve incentive spirometer and Mucinex for mucociliary clearance  Ex-smoker Annual low-dose screening CT held due to recent diagnosis of esophageal  OSA Stable on CPAP  Health maintenance 06/06/2018-Prevnar 06/04/2003-Pneumovax  Plan/Recommendations: - Change stiolto to anoro - Continue CPAP.    Marshell Garfinkel MD [facility] 03/06/2021, 2:32 PM  CC: [name], MD

## 2021-03-07 ENCOUNTER — Telehealth: Payer: Self-pay | Admitting: Internal Medicine

## 2021-03-07 ENCOUNTER — Other Ambulatory Visit: Payer: Self-pay | Admitting: Radiation Therapy

## 2021-03-07 DIAGNOSIS — R350 Frequency of micturition: Secondary | ICD-10-CM | POA: Diagnosis not present

## 2021-03-07 DIAGNOSIS — Z7982 Long term (current) use of aspirin: Secondary | ICD-10-CM | POA: Diagnosis not present

## 2021-03-07 DIAGNOSIS — K2100 Gastro-esophageal reflux disease with esophagitis, without bleeding: Secondary | ICD-10-CM | POA: Diagnosis not present

## 2021-03-07 DIAGNOSIS — G4733 Obstructive sleep apnea (adult) (pediatric): Secondary | ICD-10-CM | POA: Diagnosis not present

## 2021-03-07 DIAGNOSIS — E1169 Type 2 diabetes mellitus with other specified complication: Secondary | ICD-10-CM | POA: Diagnosis not present

## 2021-03-07 DIAGNOSIS — G8191 Hemiplegia, unspecified affecting right dominant side: Secondary | ICD-10-CM | POA: Diagnosis not present

## 2021-03-07 DIAGNOSIS — E782 Mixed hyperlipidemia: Secondary | ICD-10-CM | POA: Diagnosis not present

## 2021-03-07 DIAGNOSIS — L719 Rosacea, unspecified: Secondary | ICD-10-CM | POA: Diagnosis not present

## 2021-03-07 DIAGNOSIS — C159 Malignant neoplasm of esophagus, unspecified: Secondary | ICD-10-CM | POA: Diagnosis not present

## 2021-03-07 DIAGNOSIS — J449 Chronic obstructive pulmonary disease, unspecified: Secondary | ICD-10-CM | POA: Diagnosis not present

## 2021-03-07 DIAGNOSIS — I10 Essential (primary) hypertension: Secondary | ICD-10-CM | POA: Diagnosis not present

## 2021-03-07 DIAGNOSIS — I2510 Atherosclerotic heart disease of native coronary artery without angina pectoris: Secondary | ICD-10-CM | POA: Diagnosis not present

## 2021-03-07 DIAGNOSIS — Z7984 Long term (current) use of oral hypoglycemic drugs: Secondary | ICD-10-CM | POA: Diagnosis not present

## 2021-03-07 DIAGNOSIS — E1142 Type 2 diabetes mellitus with diabetic polyneuropathy: Secondary | ICD-10-CM | POA: Diagnosis not present

## 2021-03-07 DIAGNOSIS — I700 Atherosclerosis of aorta: Secondary | ICD-10-CM | POA: Diagnosis not present

## 2021-03-07 DIAGNOSIS — G8929 Other chronic pain: Secondary | ICD-10-CM | POA: Diagnosis not present

## 2021-03-07 DIAGNOSIS — Z87891 Personal history of nicotine dependence: Secondary | ICD-10-CM | POA: Diagnosis not present

## 2021-03-07 DIAGNOSIS — C7931 Secondary malignant neoplasm of brain: Secondary | ICD-10-CM | POA: Diagnosis not present

## 2021-03-07 DIAGNOSIS — T451X5D Adverse effect of antineoplastic and immunosuppressive drugs, subsequent encounter: Secondary | ICD-10-CM | POA: Diagnosis not present

## 2021-03-07 DIAGNOSIS — R1319 Other dysphagia: Secondary | ICD-10-CM | POA: Diagnosis not present

## 2021-03-07 DIAGNOSIS — K1231 Oral mucositis (ulcerative) due to antineoplastic therapy: Secondary | ICD-10-CM | POA: Diagnosis not present

## 2021-03-07 DIAGNOSIS — I083 Combined rheumatic disorders of mitral, aortic and tricuspid valves: Secondary | ICD-10-CM | POA: Diagnosis not present

## 2021-03-07 DIAGNOSIS — N401 Enlarged prostate with lower urinary tract symptoms: Secondary | ICD-10-CM | POA: Diagnosis not present

## 2021-03-07 DIAGNOSIS — M159 Polyosteoarthritis, unspecified: Secondary | ICD-10-CM | POA: Diagnosis not present

## 2021-03-07 DIAGNOSIS — K760 Fatty (change of) liver, not elsewhere classified: Secondary | ICD-10-CM | POA: Diagnosis not present

## 2021-03-07 MED FILL — Dexamethasone Sodium Phosphate Inj 100 MG/10ML: INTRAMUSCULAR | Qty: 1 | Status: AC

## 2021-03-07 NOTE — Telephone Encounter (Signed)
Scheduled appt per 10/4 los. Called pt, no answer. Left msg with appt date and time.

## 2021-03-07 NOTE — Progress Notes (Signed)
[facility]   [phone] Fax:([phone]) [phone]   Clinic Follow up Note   Patient Care Team: [name], MD as PCP - General (Family Medicine) [facility], [facility] Leonie Man, MD as Consulting Physician (Cardiology) Lucas Mallow, MD as Consulting Physician (Urology) Laroy Apple, MD as Referring Physician (Physical Medicine and Rehabilitation) Bettina Gavia Hilton Cork, MD as Consulting Physician (Cardiology) Marshell Garfinkel, MD as Consulting Physician (Pulmonary Disease) Truitt Merle, MD as Consulting Physician (Oncology) Eppie Gibson, MD as Attending Physician (Radiation Oncology) Royston Bake, RN as Oncology Nurse Navigator (Oncology) 03/08/2021  CHIEF COMPLAINT: Follow up metastatic esophageal cancer   SUMMARY OF ONCOLOGIC HISTORY: Oncology History  Esophageal cancer ([place])  01/22/2021 Imaging   CT of the Head  IMPRESSION: Left parietal mass with extensive vasogenic edema most concerning for a metastasis. Brain MRI without and with contrast is recommended for further evaluation.     01/22/2021 Imaging   CT CHEST, ABDOMEN, AND PELVIS  IMPRESSION: 1. Indeterminate hypodensity within the right lobe liver abutting the gallbladder fossa, concerning for neoplasm given intracranial findings. If tissue diagnosis is desired, image guided biopsy could be considered. Given the decreased conspicuity on delayed imaging, follow-up liver ultrasound with possible ultrasound-guided biopsy could be considered. Alternatively, dedicated liver MRI could be considered. 2. Enlarged prostate, with asymmetric decreased attenuation of the right peripheral zone of the prostate. Please correlate with serum PSA and physical exam findings. 3. Distended urinary bladder, which may reflect chronic bladder outlet obstruction given the enlarged prostate. 4. Diffuse fatty atrophy of the pancreas, with punctate parenchymal calcifications and lobular cystic  area within the uncinate process, likely sequela of chronic pancreatitis. 5. No acute intrathoracic process. 6. Aortic Atherosclerosis (ICD10-I70.0). Severe atherosclerosis of the bilateral common femoral arteries with greater than 90% stenosis.    01/22/2021 Imaging   BRAIN MRI W/WO CONTRAST   IMPRESSION: 1.9 cm mass within the left frontoparietal white matter with extensive surrounding edema. Favor metastatic disease over primary neoplasm.   01/26/2021 Pathology Results   CASE: [id number] A. ESOPHAGUS, DISTAL, BIOPSY:  - Adenocarcinoma, see comment.   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 Dr. Vic Ripper has reviewed the case.   01/26/2021 Procedure   Findings: A small hiatal hernia was present. The entire examined stomach was normal. The examined duodenum was normal. - Likely malignant esophageal tumor was found in the distal esophagus. Biopsied. - Small hiatal hernia. - Normal stomach.   01/26/2021 Cancer Staging   Staging form: Esophagus - Adenocarcinoma, AJCC 8th Edition - Clinical stage from 01/26/2021: Stage IVB (cTX, cNX, pM1) - Signed by Truitt Merle, MD on 02/02/2021 Stage prefix: Initial diagnosis   02/02/2021 Initial Diagnosis   Esophageal cancer ([place])   02/14/2021 -  Chemotherapy   Patient is on Treatment Plan : GASTRIC CapeOx (1000/130) q21d x 8 Cycles      02/16/2021 - 02/16/2021 Chemotherapy      Patient is on Antibody Plan: HEAD/NECK PEMBROLIZUMAB Q21D     03/08/2021 -  Chemotherapy   Patient is on Treatment Plan : Kanjinti and Pembrolizumab Q21D       CURRENT THERAPY: First line CAPOX, herceptin, and keytruda q3 weeks  INTERVAL HISTORY: Jason Snyder returns as scheduled.  He began cycle 1 [place] ox on 02/14/2021, seen 02/21/21 for toxicity check and doing well.  He had mild cold sensitivity, no residual neuropathy in the absence of cold exposure.  Denies significant fatigue, able to remain "busy"  at home.  He  tolerated 2000 mg Xeloda twice daily for 2 weeks, no hand-foot syndrome or diarrhea.  He does have a sore mouth, using viscous lidocaine.  Able to eat and drink.  Swallowing is still painful with a dry mouth, tolerates mostly normal diet and soft food.  Denies any nausea or vomiting.  Bowels moving normally.  Denies new or worsening pain.  He is down to Dex 2 mg twice daily, no improvement in his right leg weakness but not worse, ambulating with a cane, still weak but no fall.  Denies new headache, dizziness, vision change.  He reports a week and a half ago he developed bilateral leg swelling and calf pain, he has not had this problem before.  He is not aware of a diagnosis of CHF.  He is here prior to cycle 2 oxalic, he began Xeloda on 10/5.   MEDICAL HISTORY:  Past Medical History:  Diagnosis Date   BMI 30.0-30.9,adult    BPH without obstruction/lower urinary tract symptoms 10/03/2017   CAD (coronary artery disease)    cath 2020, single vessel disease, medical mgt   Chronic left shoulder pain c   Chronic low back pain with right-sided sciatica    Chronic pain 10/07/2013   Combined hyperlipidemia associated with type 2 diabetes mellitus ([place]) 07/09/2019   Controlled type 2 diabetes mellitus without complication, without long-term current use of insulin ([place]) 07/12/2014   COPD (chronic obstructive pulmonary disease) ([place]) 01/07/2014   PFTs 07/2018 minimal airway obstruction, overinflation and no response to bronchodilators.    Coronary artery disease involving native coronary artery of native heart without angina pectoris    Cardiac Cath 01/2019: Severe single-vessel disease with additional moderate two-vessel disease: 100% CTO of RCA, moderate disease in proximal LCx, distal LCx and very small caliber 1st Diag DFR-FFR negative lesion in proximal LCx Normal LV pressures with previously document, ed normal EF.   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   Hypogonadism - pineal glad abnormalities On testosterone replacement   Obesity (BMI 30-39.9)    Obesity (BMI 30.0-34.9) 10/03/2017   OSA on CPAP 10/07/2013   Primary osteoarthritis involving multiple joints 01/10/2015   Rosacea    Smoking greater than 30 pack years - quit 03/2018 10/03/2017   Thoracic and lumbosacral neuritis    Type 2 diabetes mellitus with peripheral neuropathy ([place]) 07/12/2014    SURGICAL HISTORY: Past Surgical History:  Procedure Laterality Date   ARTHROSCOPIC REPAIR ACL     BIOPSY  01/26/2021   Procedure: BIOPSY;  Surgeon: Thornton Park, MD;  Location: [facility];  Service: Gastroenterology;;   CARDIAC CATHETERIZATION     ESOPHAGOGASTRODUODENOSCOPY (EGD) WITH PROPOFOL N/A 01/26/2021   Procedure: ESOPHAGOGASTRODUODENOSCOPY (EGD) WITH PROPOFOL;  Surgeon: Thornton Park, MD;  Location: [facility];  Service: Gastroenterology;  Laterality: N/A;   INTRAVASCULAR PRESSURE WIRE/FFR STUDY N/A 01/01/2019   Procedure: INTRAVASCULAR PRESSURE WIRE/FFR STUDY;  Surgeon: Leonie Man, MD;  Location: [facility];  Service: Cardiovascular;  Laterality: N/A;   IR IMAGING GUIDED PORT INSERTION  02/12/2021   LEFT HEART CATH AND CORONARY ANGIOGRAPHY N/A 01/01/2019   Procedure: LEFT HEART CATH AND CORONARY ANGIOGRAPHY;  Surgeon: Leonie Man, MD;  Location: [facility];  Service: Cardiovascular;  Laterality: N/A;   PROSTATE CRYOABLATION     03/2019   SHOULDER ARTHROSCOPY     SPINE SURGERY      I have reviewed the social history and family history with the patient  and they are unchanged from previous note.  ALLERGIES:  is allergic to rubus fruticosus.  MEDICATIONS:  Current Outpatient Medications  Medication Sig Dispense Refill   Accu-Chek Softclix Lancets lancets Use as directed up to 4 times daily 100 each 5   acetaminophen-codeine (TYLENOL #3) 300-30 MG tablet take 1 tablet by mouth twice a day as needed for moderate pain 90 tablet 0   acetic acid 2 % otic solution Place 4 drops into both ears 2 (two) times daily as needed. (Patient taking differently: Place 4 drops into both ears 2 (two) times daily as needed (pain).) 15 mL 2   albuterol (VENTOLIN HFA) 108 (90 Base) MCG/ACT inhaler INHALE TWO PUFFS BY MOUTH INTO THE LUNGS EVERY FOUR HOURS AS NEEDED FOR WHEEZING 18 g 0   amLODipine (NORVASC) 10 MG tablet Take 1 tablet (10 mg total) by mouth daily. 90 tablet 0   aspirin 81 MG EC tablet Take 81 mg by mouth daily.      atorvastatin (LIPITOR) 20 MG tablet Take 1 tablet (20 mg total) by mouth at bedtime. 90 tablet 3   Blood Glucose Monitoring Suppl (BLOOD GLUCOSE MONITOR SYSTEM) w/Device KIT Please check blood glucose three times daily (before meals in the morning and 2 hours after largest meals of the day OR lunch and dinner) 1 kit 0   capecitabine (XELODA) 500 MG tablet Take 4 tablets (2,000 mg total) by mouth 2 (two) times daily. Take 14 days on, 7 days off, repeat every 21 days. 112 tablet 0   cyclobenzaprine (FLEXERIL) 10 MG tablet TAKE ONE TABLET BY MOUTH THREE TIMES DAILY AS NEEDED 90 tablet 0   dexamethasone (DECADRON) 2 MG tablet Taper to 2 tablets (43m) twice daily until instructed otherwise. 60 tablet 0   diclofenac sodium (VOLTAREN) 1 % GEL Apply 2 g topically 4 (four) times daily as needed (pain).     FARXIGA 10 MG TABS tablet TAKE ONE TABLET BY MOUTH ONE TIME DAILY (Patient taking differently: Take 10 mg by mouth daily.) 90 tablet 3   finasteride (PROSCAR) 5 MG tablet Take 5 mg by mouth daily.     fluticasone (FLONASE) 50 MCG/ACT nasal spray  Place 1 spray into the nose daily as needed for allergies.      gabapentin (NEURONTIN) 300 MG capsule Take 1 capsule (300 mg total) by mouth 3 (three) times daily. 180 capsule 3   glucose blood (ACCU-CHEK GUIDE) test strip Use as instructed up to 4 times daily 100 each 12   Ketotifen Fumarate (ALLERGY EYE DROPS OP) Place 1 drop into both eyes daily as needed (allergy).     lidocaine (XYLOCAINE) 2 % solution Mix 1 part 2% viscous lidocaine with 1 part water. Swish and swallow 133mof diluted mixture --use 30 minutes before meals and at bedtime, up to 4 times a day 200 mL 0   lidocaine-prilocaine (EMLA) cream Apply 1 application topically as needed. 30 g 2   lidocaine-prilocaine (EMLA) cream Apply to affected area once 30 g 3   Loratadine 10 MG CAPS Take 10  mg by mouth daily as needed (allergies).      metFORMIN (GLUCOPHAGE) 500 MG tablet Take 1 tablet (500 mg total) by mouth 2 (two) times daily with a meal. 180 tablet 0   metoprolol tartrate (LOPRESSOR) 50 MG tablet Take 1 tablet (50 mg total) by mouth 2 (two) times daily. 180 tablet 3   Multiple Vitamin (MULTIVITAMIN) capsule Take 1 capsule by mouth daily.      nitroGLYCERIN (NITROSTAT) 0.4 MG SL tablet Place 1 tablet (0.4 mg total) under the tongue every 5 (five) minutes as needed for chest pain. 25 tablet 0   ondansetron (ZOFRAN) 8 MG tablet Take 1 tablet (8 mg total) by mouth every 8 (eight) hours as needed for nausea or vomiting. 30 tablet 2   ondansetron (ZOFRAN) 8 MG tablet Take 1 tablet (8 mg total) by mouth 2 (two) times daily as needed for refractory nausea / vomiting. Start on day 3 after chemotherapy. 30 tablet 1   pantoprazole (PROTONIX) 40 MG tablet Take 1 tablet (40 mg total) by mouth daily. 30 tablet 2   prochlorperazine (COMPAZINE) 10 MG tablet Take 1 tablet (10 mg total) by mouth every 6 (six) hours as needed. 30 tablet 2   prochlorperazine (COMPAZINE) 10 MG tablet Take 1 tablet (10 mg total) by mouth every 6 (six) hours as needed  (Nausea or vomiting). 30 tablet 1   Spacer/Aero-Holding Chambers (AEROCHAMBER MV) inhaler Use as instructed 1 each 0   tamsulosin (FLOMAX) 0.4 MG CAPS capsule TAKE TWO CAPSULES BY MOUTH DAILY (Patient taking differently: Take 0.4 mg by mouth daily.) 180 capsule 0   Tiotropium Bromide-Olodaterol (STIOLTO RESPIMAT) 2.5-2.5 MCG/ACT AERS Inhale 2 puffs into the lungs daily. 4 g 11   umeclidinium-vilanterol (ANORO ELLIPTA) 62.5-25 MCG/INH AEPB Inhale 1 puff into the lungs daily. 7 each 0   No current facility-administered medications for this visit.   Facility-Administered Medications Ordered in Other Visits  Medication Dose Route Frequency Provider Last Rate Last Admin   dextrose 5 % solution   Intravenous Once Truitt Merle, MD       heparin lock flush 100 unit/mL  500 Units Intracatheter Once PRN Truitt Merle, MD       influenza vaccine adjuvanted (FLUAD) injection 0.5 mL  0.5 mL Intramuscular Once Truitt Merle, MD       oxaliplatin (ELOXATIN) 300 mg in dextrose 5 % 500 mL chemo infusion  127 mg/m2 (Treatment Plan Recorded) Intravenous Once Truitt Merle, MD       sodium chloride flush (NS) 0.9 % injection 10 mL  10 mL Intracatheter PRN Truitt Merle, MD       trastuzumab-anns [facility]) 861 mg in sodium chloride 0.9 % 250 mL chemo infusion  8 mg/kg (Treatment Plan Recorded) Intravenous Once Truitt Merle, MD        PHYSICAL EXAMINATION: ECOG PERFORMANCE STATUS: 1 - Symptomatic but completely ambulatory  Vitals:   03/08/21 0913  BP: 124/82  Pulse: 77  Resp: 17  SpO2: 95%   Filed Weights   03/08/21 0913  Weight: 229 lb 8 oz (104.1 kg)    GENERAL:alert, no distress and comfortable SKIN: No rash EYES: sclera clear OROPHARYNX: Erythema on the tongue without thrush or ulcers LUNGS: clear with normal breathing effort HEART: regular rate & rhythm, moderate pitting bilateral lower extremity edema NEURO: alert & oriented x 3 with fluent speech, ambulates with cane PAC without erythema  LABORATORY DATA:  I  have reviewed the data as listed CBC Latest Ref Rng & Units  03/08/2021 02/13/2021 02/02/2021  WBC 4.0 - 10.5 K/uL 4.0 9.6 9.5  Hemoglobin 13.0 - 17.0 g/dL 14.9 15.8 16.6  Hematocrit 39.0 - 52.0 % 43.1 46.6 48.5  Platelets 150 - 400 K/uL 102(L) 204 276     CMP Latest Ref Rng & Units 03/08/2021 02/13/2021 02/02/2021  Glucose 70 - 99 mg/dL 231(H) 181(H) 143(H)  BUN 8 - 23 mg/dL _0 Creatinine 0.61 - 1.24 mg/dL 0.85 0.91 0.89  Sodium 135 - 145 mmol/L 136 139 140  Potassium 3.5 - 5.1 mmol/L 4.2 4.4 4.2  Chloride 98 - 111 mmol/L 101 103 104  CO2 22 - 32 mmol/L _1 Calcium 8.9 - 10.3 mg/dL 8.7(L) 8.8(L) 9.2  Total Protein 6.5 - 8.1 g/dL 5.7(L) 6.2(L) 6.8  Total Bilirubin 0.3 - 1.2 mg/dL 0.7 0.8 0.6  Alkaline Phos 38 - 126 U/L 74 83 96  AST 15 - 41 U/L 18 14(L) 23  ALT 0 - 44 U/L 46(H) 55(H) 147(H)      RADIOGRAPHIC STUDIES: I have personally reviewed the radiological images as listed and agreed with the findings in the report. No results found.   ASSESSMENT & PLAN: Jason Snyder is a 70 y.o. male with    1. 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  -Work up with CT, MRI, and EGD show medium-sized, ulcerating, non-obstructing, not circumferential mass in distal esophagus with liver metastasis. CT chest was negative  -liver biopsy on 01/24/21 showed adenocarcinoma. Esophageal biopsy from 01/26/21 also showed adenocarcinoma, consistent with esophageal primary.   -Her2 was requested on both biopsy samples-- the esophagus was HER2+ (by FISH, 2+), and the liver was HER2- (0). This raised the question if he has two primaries.  -He began first line chemo CAPOX 02/14/21, he tolerated very well.  -He will add herceptin  today with cycle 2 CAPOX, and keytruda when he weans off dex    2. Metastatic disease to the brain -He presented with right arm and right leg weakness.   -Brain MRI showed a 1.9 cm mass in the left frontoparietal white matter with extensive surrounding edema. Another punctate left occipital metastasis was seen on the 3 Tesla MRI.  -He underwent SRS to the metastatic brain lesions on 02/09/21 per Dr. Isidore Moos. -R arm weakness improved on oral dex but leg weakness has not improved.  -repeat MRI scheduled on 03/09/21  -currently on dex 2 mg BID   3. GERD -The patient has a longstanding history of GERD, on pantoproazole 40 mg BID   4. Comorbidities (COPD, DM) -controlled, stable   5. Lower extremity edema -onset 1.5 weeks ago with some calf pain -? H/o CHF -reviewed possible etiologies including immobility, low protein, versus thrombosis -Given his history of metastatic cancer on chemo, I am referring him for Doppler to rule out DVT -In the meantime, elevate legs, wear compression stockings, increase dietary protein, and remain mobile  Disposition: Mr. Babington appears stable.  He completed 1 cycle of Capox with Xeloda 2000 mg twice daily for 2 weeks on/1 week off.  He tolerates treatment very well without significant side effects.  He is able to recover and function well.  Neuro status is stable on Dex 2 mg twice daily.  He is  scheduled for brain MRI 03/09/2021, follow-up per Dr. Mickeal Skinner after tumor board discussion.  He has developed moderate lower extremity edema, he is being referred for venous Doppler to rule out DVT.  In the meantime I encouraged him to elevate legs, wear compression stockings, and increase dietary protein.  Labs reviewed, adequate to proceed with cycle 2 Capox today at same dose and first Herceptin as planned.  Once he comes off Dex we plan to add pembrolizumab. At that time we will d/c dex premed for oxaliplatin.   He will receive high-dose flu vac today if we have it.  F/up  in 3 weeks with cycle 3  All questions were answered. The patient knows to call the clinic with any problems, questions or concerns. No barriers to learning were detected.  Total encounter time was 30 minutes.        Alla Feeling, NP 03/08/21

## 2021-03-08 ENCOUNTER — Inpatient Hospital Stay: Payer: Medicare Other

## 2021-03-08 ENCOUNTER — Other Ambulatory Visit: Payer: Self-pay | Admitting: Radiation Therapy

## 2021-03-08 ENCOUNTER — Encounter: Payer: Self-pay | Admitting: Nurse Practitioner

## 2021-03-08 ENCOUNTER — Other Ambulatory Visit: Payer: Self-pay

## 2021-03-08 ENCOUNTER — Inpatient Hospital Stay: Payer: Medicare Other | Admitting: Nutrition

## 2021-03-08 ENCOUNTER — Inpatient Hospital Stay (HOSPITAL_BASED_OUTPATIENT_CLINIC_OR_DEPARTMENT_OTHER): Payer: Medicare Other | Admitting: Nurse Practitioner

## 2021-03-08 VITALS — BP 124/82 | HR 77 | Resp 17 | Ht 74.0 in | Wt 229.5 lb

## 2021-03-08 DIAGNOSIS — Z79899 Other long term (current) drug therapy: Secondary | ICD-10-CM | POA: Diagnosis not present

## 2021-03-08 DIAGNOSIS — C7931 Secondary malignant neoplasm of brain: Secondary | ICD-10-CM

## 2021-03-08 DIAGNOSIS — C155 Malignant neoplasm of lower third of esophagus: Secondary | ICD-10-CM

## 2021-03-08 DIAGNOSIS — Z95828 Presence of other vascular implants and grafts: Secondary | ICD-10-CM

## 2021-03-08 DIAGNOSIS — Z23 Encounter for immunization: Secondary | ICD-10-CM

## 2021-03-08 DIAGNOSIS — K1231 Oral mucositis (ulcerative) due to antineoplastic therapy: Secondary | ICD-10-CM

## 2021-03-08 DIAGNOSIS — R600 Localized edema: Secondary | ICD-10-CM

## 2021-03-08 DIAGNOSIS — Z5112 Encounter for antineoplastic immunotherapy: Secondary | ICD-10-CM | POA: Diagnosis not present

## 2021-03-08 DIAGNOSIS — Z87891 Personal history of nicotine dependence: Secondary | ICD-10-CM | POA: Diagnosis not present

## 2021-03-08 LAB — TSH: TSH: 1.572 u[IU]/mL (ref 0.320–4.118)

## 2021-03-08 LAB — CBC WITH DIFFERENTIAL (CANCER CENTER ONLY)
Abs Immature Granulocytes: 0.2 10*3/uL — ABNORMAL HIGH (ref 0.00–0.07)
Basophils Absolute: 0 10*3/uL (ref 0.0–0.1)
Basophils Relative: 1 %
Eosinophils Absolute: 0 10*3/uL (ref 0.0–0.5)
Eosinophils Relative: 1 %
HCT: 43.1 % (ref 39.0–52.0)
Hemoglobin: 14.9 g/dL (ref 13.0–17.0)
Immature Granulocytes: 5 %
Lymphocytes Relative: 28 %
Lymphs Abs: 1.1 10*3/uL (ref 0.7–4.0)
MCH: 32 pg (ref 26.0–34.0)
MCHC: 34.6 g/dL (ref 30.0–36.0)
MCV: 92.5 fL (ref 80.0–100.0)
Monocytes Absolute: 0.2 10*3/uL (ref 0.1–1.0)
Monocytes Relative: 6 %
Neutro Abs: 2.4 10*3/uL (ref 1.7–7.7)
Neutrophils Relative %: 59 %
Platelet Count: 102 10*3/uL — ABNORMAL LOW (ref 150–400)
RBC: 4.66 MIL/uL (ref 4.22–5.81)
RDW: 14.6 % (ref 11.5–15.5)
WBC Count: 4 10*3/uL (ref 4.0–10.5)
nRBC: 0 % (ref 0.0–0.2)

## 2021-03-08 LAB — CMP (CANCER CENTER ONLY)
ALT: 46 U/L — ABNORMAL HIGH (ref 0–44)
AST: 18 U/L (ref 15–41)
Albumin: 3.2 g/dL — ABNORMAL LOW (ref 3.5–5.0)
Alkaline Phosphatase: 74 U/L (ref 38–126)
Anion gap: 12 (ref 5–15)
BUN: 23 mg/dL (ref 8–23)
CO2: 23 mmol/L (ref 22–32)
Calcium: 8.7 mg/dL — ABNORMAL LOW (ref 8.9–10.3)
Chloride: 101 mmol/L (ref 98–111)
Creatinine: 0.85 mg/dL (ref 0.61–1.24)
GFR, Estimated: >60 mL/min (ref >60)
Glucose, Bld: 231 mg/dL — ABNORMAL HIGH (ref 70–99)
Potassium: 4.2 mmol/L (ref 3.5–5.1)
Sodium: 136 mmol/L (ref 135–145)
Total Bilirubin: 0.7 mg/dL (ref 0.3–1.2)
Total Protein: 5.7 g/dL — ABNORMAL LOW (ref 6.5–8.1)

## 2021-03-08 MED ORDER — INFLUENZA VAC A&B SA ADJ QUAD 0.5 ML IM PRSY
0.5000 mL | PREFILLED_SYRINGE | Freq: Once | INTRAMUSCULAR | Status: AC
  Administered 2021-03-08: 0.5 mL via INTRAMUSCULAR
  Filled 2021-03-08: qty 0.5

## 2021-03-08 MED ORDER — OXALIPLATIN CHEMO INJECTION 100 MG/20ML
127.0000 mg/m2 | Freq: Once | INTRAVENOUS | Status: AC
  Administered 2021-03-08: 300 mg via INTRAVENOUS
  Filled 2021-03-08: qty 60

## 2021-03-08 MED ORDER — TRASTUZUMAB-ANNS CHEMO 150 MG IV SOLR
8.0000 mg/kg | Freq: Once | INTRAVENOUS | Status: AC
  Administered 2021-03-08: 861 mg via INTRAVENOUS
  Filled 2021-03-08: qty 41

## 2021-03-08 MED ORDER — SODIUM CHLORIDE 0.9% FLUSH
10.0000 mL | INTRAVENOUS | Status: DC | PRN
Start: 2021-03-08 — End: 2021-03-08
  Administered 2021-03-08: 10 mL

## 2021-03-08 MED ORDER — HEPARIN SOD (PORK) LOCK FLUSH 100 UNIT/ML IV SOLN
500.0000 [IU] | Freq: Once | INTRAVENOUS | Status: AC | PRN
  Administered 2021-03-08: 500 [IU]

## 2021-03-08 MED ORDER — SODIUM CHLORIDE 0.9 % IV SOLN
10.0000 mg | Freq: Once | INTRAVENOUS | Status: AC
  Administered 2021-03-08: 10 mg via INTRAVENOUS
  Filled 2021-03-08: qty 10

## 2021-03-08 MED ORDER — DEXTROSE 5 % IV SOLN: Freq: Once | INTRAVENOUS | Status: AC

## 2021-03-08 MED ORDER — PALONOSETRON HCL INJECTION 0.25 MG/5ML
0.2500 mg | Freq: Once | INTRAVENOUS | Status: AC
  Administered 2021-03-08: 0.25 mg via INTRAVENOUS
  Filled 2021-03-08: qty 5

## 2021-03-08 MED ORDER — DIPHENHYDRAMINE HCL 25 MG PO CAPS
25.0000 mg | ORAL_CAPSULE | Freq: Once | ORAL | Status: AC
  Administered 2021-03-08: 25 mg via ORAL
  Filled 2021-03-08: qty 1

## 2021-03-08 MED ORDER — ACETAMINOPHEN 325 MG PO TABS
650.0000 mg | ORAL_TABLET | Freq: Once | ORAL | Status: AC
  Administered 2021-03-08: 650 mg via ORAL
  Filled 2021-03-08: qty 2

## 2021-03-08 MED ORDER — SODIUM CHLORIDE 0.9 % IV SOLN: Freq: Once | INTRAVENOUS | Status: AC

## 2021-03-08 MED ORDER — SODIUM CHLORIDE 0.9% FLUSH
10.0000 mL | Freq: Once | INTRAVENOUS | Status: AC
  Administered 2021-03-08: 10 mL

## 2021-03-08 NOTE — Progress Notes (Signed)
70 year old male diagnosed with Esophagus cancer receiving CAPOX, Trastuzumab, and Keytruda.  PMH includes GERD, COPD, DM, HLD, HTN.  Medications include Protonix, Glucophage, MVI, Zofran, and Compazine.  Labs include Glucose 231 and Alb 3.2.  Height: 6"2". Weight: 229.5 pounds. Oct 6  UBW: ~240 pounds. BMI: 29.47  Patient reports sore mouth and some difficulty swallowing due to dry mouth. He denies nausea and vomiting. He tried Viscous Lidocaine for his sore mouth but it doesn't last long. Patient states his weight is stable.  Nutrition Diagnosis: Food and Nutrition Related Knowledge Deficit related to esophagus cancer and associated treatments as evidenced by no prior need for nutrition related information.  Intervention: Educated on strategies to consume adequate calories and protein. Encouraged wt maintenance. Educated on strategies to improve mouth sores and dry mouth. Fact sheets provided. Questions answered and teach back used. Contact information given.  Monitoring, Evaluation, Goals: Patient will tolerate adequate calories and protein to minimize wt loss.  Next Visit: To be scheduled with upcoming treatment as needed.

## 2021-03-08 NOTE — Progress Notes (Signed)
Port access orders entered for [facility].   Mont Dutton R.T.(R)(T)

## 2021-03-08 NOTE — Patient Instructions (Signed)
[facility]  Discharge Instructions: Thank you for choosing [facility] to provide your oncology and hematology care.   If you have a lab appointment with the [facility], please go directly to the [facility] and check in at the registration area.   Wear comfortable clothing and clothing appropriate for easy access to any Portacath or PICC line.   We strive to give you quality time with your provider. You may need to reschedule your appointment if you arrive late (15 or more minutes).  Arriving late affects you and other patients whose appointments are after yours.  Also, if you miss three or more appointments without notifying the office, you may be dismissed from the clinic at the provider's discretion.      For prescription refill requests, have your pharmacy contact our office and allow 72 hours for refills to be completed.    Today you received the following chemotherapy and/or immunotherapy agents Oxaliplatin and Trastuzumab      To help prevent nausea and vomiting after your treatment, we encourage you to take your nausea medication as directed.  BELOW ARE SYMPTOMS THAT SHOULD BE REPORTED IMMEDIATELY: *FEVER GREATER THAN 100.4 F (38 C) OR HIGHER *CHILLS OR SWEATING *NAUSEA AND VOMITING THAT IS NOT CONTROLLED WITH YOUR NAUSEA MEDICATION *UNUSUAL SHORTNESS OF BREATH *UNUSUAL BRUISING OR BLEEDING *URINARY PROBLEMS (pain or burning when urinating, or frequent urination) *BOWEL PROBLEMS (unusual diarrhea, constipation, pain near the anus) TENDERNESS IN MOUTH AND THROAT WITH OR WITHOUT PRESENCE OF ULCERS (sore throat, sores in mouth, or a toothache) UNUSUAL RASH, SWELLING OR PAIN  UNUSUAL VAGINAL DISCHARGE OR ITCHING   Items with * indicate a potential emergency and should be followed up as soon as possible or go to the Emergency Department if any problems should occur.  Please show the CHEMOTHERAPY ALERT CARD or IMMUNOTHERAPY ALERT  CARD at check-in to the Emergency Department and triage nurse.  Should you have questions after your visit or need to cancel or reschedule your appointment, please contact [facility]  Dept: [phone]  and follow the prompts.  Office hours are 8:00 a.m. to 4:30 p.m. Monday - Friday. Please note that voicemails left after 4:00 p.m. may not be returned until the following business day.  We are closed weekends and major holidays. You have access to a nurse at all times for urgent questions. Please call the main number to the clinic Dept: [phone] and follow the prompts.   For any non-urgent questions, you may also contact your provider using MyChart. We now offer e-Visits for anyone 73 and older to request care online for non-urgent symptoms. For details visit [email or web address].   Also download the MyChart app! Go to the app store, search "MyChart", open the app, select [facility], and log in with your MyChart username and password.  Due to Covid, a mask is required upon entering the hospital/clinic. If you do not have a mask, one will be given to you upon arrival. For doctor visits, patients may have 1 support person aged 79 or older with them. For treatment visits, patients cannot have anyone with them due to current Covid guidelines and our immunocompromised population.

## 2021-03-09 ENCOUNTER — Ambulatory Visit
Admission: RE | Admit: 2021-03-09 | Discharge: 2021-03-09 | Disposition: A | Discharge status: Home / Self Care | Payer: Medicare Other | Source: Ambulatory Visit | Attending: Internal Medicine | Admitting: Internal Medicine

## 2021-03-09 ENCOUNTER — Telehealth: Payer: Self-pay | Admitting: Hematology

## 2021-03-09 DIAGNOSIS — C7931 Secondary malignant neoplasm of brain: Secondary | ICD-10-CM

## 2021-03-09 DIAGNOSIS — C719 Malignant neoplasm of brain, unspecified: Secondary | ICD-10-CM | POA: Diagnosis not present

## 2021-03-09 DIAGNOSIS — G936 Cerebral edema: Secondary | ICD-10-CM | POA: Diagnosis not present

## 2021-03-09 DIAGNOSIS — C159 Malignant neoplasm of esophagus, unspecified: Secondary | ICD-10-CM | POA: Diagnosis not present

## 2021-03-09 DIAGNOSIS — C799 Secondary malignant neoplasm of unspecified site: Secondary | ICD-10-CM | POA: Diagnosis not present

## 2021-03-09 MED ORDER — GADOBENATE DIMEGLUMINE 529 MG/ML IV SOLN
20.0000 mL | Freq: Once | INTRAVENOUS | Status: AC | PRN
  Administered 2021-03-09: 20 mL via INTRAVENOUS

## 2021-03-09 MED ORDER — HEPARIN SOD (PORK) LOCK FLUSH 100 UNIT/ML IV SOLN
500.0000 [IU] | Freq: Once | INTRAVENOUS | Status: AC
  Administered 2021-03-09: 500 [IU] via INTRAVENOUS

## 2021-03-09 MED ORDER — SODIUM CHLORIDE 0.9% FLUSH
10.0000 mL | INTRAVENOUS | Status: DC | PRN
  Administered 2021-03-09: 10 mL via INTRAVENOUS

## 2021-03-09 NOTE — Telephone Encounter (Signed)
Scheduled follow-up appointments per 10/6 los. Patient is aware. 

## 2021-03-12 ENCOUNTER — Ambulatory Visit (HOSPITAL_COMMUNITY)
Admission: RE | Admit: 2021-03-12 | Discharge: 2021-03-12 | Disposition: A | Discharge status: Home / Self Care | Payer: Medicare Other | Source: Ambulatory Visit | Attending: Nurse Practitioner | Admitting: Nurse Practitioner

## 2021-03-12 ENCOUNTER — Other Ambulatory Visit: Payer: Self-pay

## 2021-03-12 DIAGNOSIS — R600 Localized edema: Secondary | ICD-10-CM | POA: Diagnosis not present

## 2021-03-13 ENCOUNTER — Ambulatory Visit: Payer: Self-pay | Admitting: Radiation Oncology

## 2021-03-13 DIAGNOSIS — T451X5D Adverse effect of antineoplastic and immunosuppressive drugs, subsequent encounter: Secondary | ICD-10-CM | POA: Diagnosis not present

## 2021-03-13 DIAGNOSIS — K1231 Oral mucositis (ulcerative) due to antineoplastic therapy: Secondary | ICD-10-CM | POA: Diagnosis not present

## 2021-03-13 DIAGNOSIS — C159 Malignant neoplasm of esophagus, unspecified: Secondary | ICD-10-CM | POA: Diagnosis not present

## 2021-03-13 DIAGNOSIS — G8191 Hemiplegia, unspecified affecting right dominant side: Secondary | ICD-10-CM | POA: Diagnosis not present

## 2021-03-13 DIAGNOSIS — K2100 Gastro-esophageal reflux disease with esophagitis, without bleeding: Secondary | ICD-10-CM | POA: Diagnosis not present

## 2021-03-13 DIAGNOSIS — C7931 Secondary malignant neoplasm of brain: Secondary | ICD-10-CM | POA: Diagnosis not present

## 2021-03-15 ENCOUNTER — Inpatient Hospital Stay (HOSPITAL_BASED_OUTPATIENT_CLINIC_OR_DEPARTMENT_OTHER): Payer: Medicare Other | Admitting: Internal Medicine

## 2021-03-15 DIAGNOSIS — C7931 Secondary malignant neoplasm of brain: Secondary | ICD-10-CM

## 2021-03-15 NOTE — Progress Notes (Signed)
I connected with Jason Snyder on 03/15/21 at 11:00 AM EDT by telephone visit and verified that I am speaking with the correct person using two identifiers.  I discussed the limitations, risks, security and privacy concerns of performing an evaluation and management service by telemedicine and the availability of in-person appointments. I also discussed with the patient that there may be a patient responsible charge related to this service. The patient expressed understanding and agreed to proceed.  Other persons participating in the visit and their role in the encounter:  n/a   Patient's location:  Home  Provider's location:  Office  Chief Complaint:  Brain metastases [facility]) - Plan: MR BRAIN W WO CONTRAST  History of Present Ilness: Jason Snyder describes no change in right sided weakness, dizziness, since starting the lower dose of decadron 4mg  daily.  He continues to require assistance with gait as prior.  He is now working with a physical therapist.  Does describe new onset "tightness or pressure at times" in his head.    Observations: Language and cognition at baseline  Imaging:  [facility] Clinician Interpretation: I have personally reviewed the CNS images as listed.  My interpretation, in the context of the patient's clinical presentation, is stable disease  CT Chest Wo Contrast  Result Date: 02/20/2021 CLINICAL DATA:  COPD exacerbation. Esophageal cancer with metastases. EXAM: CT CHEST WITHOUT CONTRAST TECHNIQUE: Multidetector CT imaging of the chest was performed following the standard protocol without IV contrast. COMPARISON:  11/03/2020 FINDINGS: Cardiovascular: Tiny anterior pericardial effusion. Coronary artery calcification is evident. Mild atherosclerotic calcification is noted in the wall of the thoracic aorta. Right Port-A-Cath tip is positioned in the distal SVC. Mediastinum/Nodes: No mediastinal lymphadenopathy by size criteria. 8 mm low paraesophageal node on 23/2 is stable. No evidence  for gross hilar lymphadenopathy although assessment is limited by the lack of intravenous contrast on the current study. Probable mild wall thickening distal esophagus. There is no axillary lymphadenopathy. Lungs/Pleura: No suspicious pulmonary nodule or mass. No focal airspace consolidation. No pleural effusion. Mild bronchial wall thickening. Upper Abdomen: The liver shows diffusely decreased attenuation suggesting fat deposition. Stable 11 mm hypodensity towards the hepatic dome. Musculoskeletal: No worrisome lytic or sclerotic osseous abnormality. IMPRESSION: 1. No acute findings in the chest. Specifically, no findings to explain the patient's history of shortness of breath. 2. Probable mild wall thickening distal esophagus. 3. Hepatic steatosis with stable hypodensity towards the hepatic dome. Recent MRI characterized multiple hepatic lesions suspicious for metastatic disease. 4. Aortic Atherosclerosis (ICD10-I70.0). Electronically Signed   By: Misty Stanley M.D.   On: 02/20/2021 13:01   MR BRAIN W WO CONTRAST  Result Date: 03/10/2021 CLINICAL DATA:  Brain/CNS neoplasm, assess treatment response. Metastatic esophageal cancer. SRS to left parietal and occipital lesions on 02/22/2021. EXAM: MRI HEAD WITHOUT AND WITH CONTRAST TECHNIQUE: Multiplanar, multiecho pulse sequences of the brain and surrounding structures were obtained without and with intravenous contrast. CONTRAST:  23mL MULTIHANCE GADOBENATE DIMEGLUMINE 529 MG/ML IV SOLN Contrast was administered via a port which was accessed by a Equities trader. COMPARISON:  01/31/2021 FINDINGS: Brain: New lesions: None. Larger lesions: None. Stable or smaller lesions: 1. 13 mm left parietal metastasis, decreased in size (series 11, image 114, previously 21 mm). 2. 2 mm left occipital metastasis, unchanged (series 11, image 71). Other brain findings: There is moderate vasogenic edema in the left parietal lobe which has mildly decreased from the prior MRI. There  is mild regional mass effect without midline shift. Small T2 hyperintensities elsewhere  in the cerebral white matter bilaterally are nonspecific but compatible with mild chronic small vessel ischemic disease. The ventricles are normal in size. No acute infarct, intracranial hemorrhage, or extra-axial fluid collection is evident. Vascular: Major intracranial vascular flow voids are preserved. Skull and upper cervical spine: Unremarkable bone marrow signal. Sinuses/Orbits: Unremarkable orbits. Chronic opacification of a small air cell supero laterally in the right frontal sinus. Clear mastoid air cells. Other: None. IMPRESSION: 1. Decreased size of left parietal metastasis with decreased edema. 2. Unchanged punctate left occipital metastasis. 3. No evidence of new intracranial metastases. Electronically Signed   By: Logan Bores M.D.   On: 03/10/2021 11:41   ECHOCARDIOGRAM COMPLETE  Result Date: 02/22/2021    ECHOCARDIOGRAM REPORT   Patient Name:   Jason Snyder Date of Exam: 02/22/2021 Medical Rec #:  [id number]   Height:       74.0 in Accession #:    [id number]  Weight:       239.0 lb Date of Birth:  1950-11-19   BSA:          2.344 m Patient Age:    70 years    BP:           160/90 mmHg Patient Gender: M           HR:           71 bpm. Exam Location:  Outpatient Procedure: 2D Echo, 3D Echo, Cardiac Doppler, Color Doppler and Strain Analysis Indications:    Z51.11 Encounter for antineoplastic chemotheraphy  History:        Patient has prior history of Echocardiogram examinations, most                 recent 12/09/2018. CAD, COPD; Risk Factors:Hypertension, Diabetes                 and Dyslipidemia. Metastatic cancer.  Sonographer:    Roseanna Rainbow RDCS Referring Phys: [id number] [facility]  Sonographer Comments: Technically difficult study due to poor echo windows. Global longitudinal strain was attempted. IMPRESSIONS  1. Left ventricular ejection fraction, by estimation, is 60 to 65%. The left ventricle has normal function.  The left ventricle has no regional wall motion abnormalities. There is mild left ventricular hypertrophy. Left ventricular diastolic parameters are consistent with Grade I diastolic dysfunction (impaired relaxation). The average left ventricular global longitudinal strain is -20.5 %.  2. Right ventricular systolic function is normal. The right ventricular size is normal.  3. The mitral valve is normal in structure. Trivial mitral valve regurgitation. No evidence of mitral stenosis.  4. The aortic valve is normal in structure. There is mild calcification of the aortic valve. Aortic valve regurgitation is mild. No aortic stenosis is present.  5. The inferior vena cava is normal in size with greater than 50% respiratory variability, suggesting right atrial pressure of 3 mmHg. FINDINGS  Left Ventricle: Left ventricular ejection fraction, by estimation, is 60 to 65%. The left ventricle has normal function. The left ventricle has no regional wall motion abnormalities. The average left ventricular global longitudinal strain is -20.5 %. The left ventricular internal cavity size was normal in size. There is mild left ventricular hypertrophy. Left ventricular diastolic parameters are consistent with Grade I diastolic dysfunction (impaired relaxation). Right Ventricle: The right ventricular size is normal. No increase in right ventricular wall thickness. Right ventricular systolic function is normal. Left Atrium: Left atrial size was normal in size. Right Atrium: Right atrial size was normal in size. Pericardium: There  is no evidence of pericardial effusion. Mitral Valve: The mitral valve is normal in structure. Trivial mitral valve regurgitation. No evidence of mitral valve stenosis. Tricuspid Valve: The tricuspid valve is normal in structure. Tricuspid valve regurgitation is trivial. No evidence of tricuspid stenosis. Aortic Valve: The aortic valve is normal in structure. There is mild calcification of the aortic valve.  Aortic valve regurgitation is mild. No aortic stenosis is present. Pulmonic Valve: The pulmonic valve was normal in structure. Pulmonic valve regurgitation is trivial. No evidence of pulmonic stenosis. Aorta: The aortic root is normal in size and structure. Venous: The inferior vena cava is normal in size with greater than 50% respiratory variability, suggesting right atrial pressure of 3 mmHg. IAS/Shunts: No atrial level shunt detected by color flow Doppler.  LEFT VENTRICLE PLAX 2D LVIDd:         4.80 cm      Diastology LVIDs:         3.20 cm      LV e' medial:    5.44 cm/s LV PW:         1.40 cm      LV E/e' medial:  7.9 LV IVS:        1.10 cm      LV e' lateral:   5.55 cm/s LVOT diam:     2.00 cm      LV E/e' lateral: 7.8 LV SV:         55 LV SV Index:   23           2D Longitudinal Strain LVOT Area:     3.14 cm     2D Strain GLS Avg:     -20.5 %  LV Volumes (MOD) LV vol d, MOD A2C: 104.0 ml 3D Volume EF: LV vol d, MOD A4C: 67.9 ml  3D EF:        60 % LV vol s, MOD A2C: 48.8 ml  LV EDV:       148 ml LV vol s, MOD A4C: 17.8 ml  LV ESV:       60 ml LV SV MOD A2C:     55.2 ml  LV SV:        88 ml LV SV MOD A4C:     67.9 ml LV SV MOD BP:      53.3 ml RIGHT VENTRICLE             IVC RV S prime:     12.60 cm/s  IVC diam: 1.50 cm TAPSE (M-mode): 2.0 cm LEFT ATRIUM             Index       RIGHT ATRIUM           Index LA diam:        3.90 cm 1.66 cm/m  RA Area:     16.40 cm LA Vol (A2C):   32.7 ml 13.95 ml/m RA Volume:   41.40 ml  17.66 ml/m LA Vol (A4C):   30.2 ml 12.88 ml/m LA Biplane Vol: 31.3 ml 13.35 ml/m  AORTIC VALVE LVOT Vmax:   91.65 cm/s LVOT Vmean:  59.200 cm/s LVOT VTI:    0.174 m  AORTA Ao Root diam: 3.90 cm Ao Asc diam:  3.60 cm MITRAL VALVE MV Area (PHT): 2.91 cm    SHUNTS MV Decel Time: 261 msec    Systemic VTI:  0.17 m MV E velocity: 43.20 cm/s  Systemic Diam: 2.00 cm MV A velocity: 64.60  cm/s MV E/A ratio:  0.67 Glori Bickers MD Electronically signed by Glori Bickers MD Signature Date/Time:  02/22/2021/1:46:14 PM    Final    VAS [place] LOWER EXTREMITY VENOUS (DVT)  Result Date: 03/12/2021  Lower Venous DVT Study Patient Name:  AYCE PIETRZYK  Date of Exam:   03/12/2021 Medical Rec #: [id number]    Accession #:    [id number] Date of Birth: 12/14/1950    Patient Gender: M Patient Age:   70 years Exam Location:  [facility] Procedure:      VAS [place] LOWER EXTREMITY VENOUS (DVT) Referring Phys: Cira Rue --------------------------------------------------------------------------------  Indications: Edema, and Pain.  Risk Factors: Metastatic CA on chemotherapy. Comparison Study: Previous exam 01/02/2021 - negative for DVT Performing Technologist: Jody Hill RVT, RDMS  Examination Guidelines: A complete evaluation includes B-mode imaging, spectral Doppler, color Doppler, and power Doppler as needed of all accessible portions of each vessel. Bilateral testing is considered an integral part of a complete examination. Limited examinations for reoccurring indications may be performed as noted. The reflux portion of the exam is performed with the patient in reverse Trendelenburg.  +---------+---------------+---------+-----------+----------+--------------+ RIGHT    CompressibilityPhasicitySpontaneityPropertiesThrombus Aging +---------+---------------+---------+-----------+----------+--------------+ CFV      Full           Yes      Yes                                 +---------+---------------+---------+-----------+----------+--------------+ SFJ      Full                                                        +---------+---------------+---------+-----------+----------+--------------+ FV Prox  Full           Yes      Yes                                 +---------+---------------+---------+-----------+----------+--------------+ FV Mid   Full           Yes      Yes                                 +---------+---------------+---------+-----------+----------+--------------+ FV  DistalFull           Yes      Yes                                 +---------+---------------+---------+-----------+----------+--------------+ PFV      Full                                                        +---------+---------------+---------+-----------+----------+--------------+ POP      Full           Yes      Yes                                 +---------+---------------+---------+-----------+----------+--------------+  PTV      Full                                                        +---------+---------------+---------+-----------+----------+--------------+ PERO     Full                                                        +---------+---------------+---------+-----------+----------+--------------+   +---------+---------------+---------+-----------+----------+--------------+ LEFT     CompressibilityPhasicitySpontaneityPropertiesThrombus Aging +---------+---------------+---------+-----------+----------+--------------+ CFV      Full           Yes      Yes                                 +---------+---------------+---------+-----------+----------+--------------+ SFJ      Full                                                        +---------+---------------+---------+-----------+----------+--------------+ FV Prox  Full           Yes      Yes                                 +---------+---------------+---------+-----------+----------+--------------+ FV Mid   Full           Yes      Yes                                 +---------+---------------+---------+-----------+----------+--------------+ FV DistalFull           Yes      Yes                                 +---------+---------------+---------+-----------+----------+--------------+ PFV      Full                                                        +---------+---------------+---------+-----------+----------+--------------+ POP      Full           Yes      Yes                                  +---------+---------------+---------+-----------+----------+--------------+ PTV      Full                                                        +---------+---------------+---------+-----------+----------+--------------+  PERO     Full                                                        +---------+---------------+---------+-----------+----------+--------------+     Summary: BILATERAL: - No evidence of deep vein thrombosis seen in the lower extremities, bilaterally. - No evidence of superficial venous thrombosis in the lower extremities, bilaterally. -No evidence of popliteal cyst, bilaterally.   *See table(s) above for measurements and observations. Electronically signed by Monica Martinez MD on 03/12/2021 at 4:31:33 PM.    Final     Assessment and Plan:  Clinically and radiographically stable.  Enhancing burden and edema are both improved with corticosteroids, despite lack of clinical benefit.    Recommended decreasing decadron to 2mg  daily x14 days, then 1mg  daily x14 days, then stopping if tolerated.  Will con't to work with PT to recover motor function as discussed.  Follow Up Instructions: RTC in January after next MRI brain study.  I discussed the assessment and treatment plan with the patient.  The patient was provided an opportunity to ask questions and all were answered.  The patient agreed with the plan and demonstrated understanding of the instructions.    The patient was advised to call back or seek an in-person evaluation if the symptoms worsen or if the condition fails to improve as anticipated.  I provided 5-10 minutes of non-face-to-face time during this enocunter.  Ventura Sellers, MD   I provided 22 minutes of non face-to-face telephone visit time during this encounter, and > 50% was spent counseling as documented under my assessment & plan.

## 2021-03-17 ENCOUNTER — Emergency Department (HOSPITAL_COMMUNITY): Payer: Medicare Other

## 2021-03-17 ENCOUNTER — Inpatient Hospital Stay (HOSPITAL_COMMUNITY)
Admission: EM | Admit: 2021-03-17 | Discharge: 2021-04-03 | DRG: 371 | Disposition: E | Payer: Medicare Other | Attending: Emergency Medicine | Admitting: Emergency Medicine

## 2021-03-17 ENCOUNTER — Other Ambulatory Visit: Payer: Self-pay

## 2021-03-17 ENCOUNTER — Encounter (HOSPITAL_COMMUNITY): Payer: Self-pay | Admitting: Emergency Medicine

## 2021-03-17 DIAGNOSIS — I2582 Chronic total occlusion of coronary artery: Secondary | ICD-10-CM | POA: Diagnosis present

## 2021-03-17 DIAGNOSIS — A0472 Enterocolitis due to Clostridium difficile, not specified as recurrent: Secondary | ICD-10-CM | POA: Diagnosis not present

## 2021-03-17 DIAGNOSIS — T508X5A Adverse effect of diagnostic agents, initial encounter: Secondary | ICD-10-CM | POA: Diagnosis not present

## 2021-03-17 DIAGNOSIS — E1165 Type 2 diabetes mellitus with hyperglycemia: Secondary | ICD-10-CM | POA: Diagnosis present

## 2021-03-17 DIAGNOSIS — J189 Pneumonia, unspecified organism: Secondary | ICD-10-CM | POA: Diagnosis not present

## 2021-03-17 DIAGNOSIS — G8929 Other chronic pain: Secondary | ICD-10-CM | POA: Diagnosis present

## 2021-03-17 DIAGNOSIS — C787 Secondary malignant neoplasm of liver and intrahepatic bile duct: Secondary | ICD-10-CM | POA: Diagnosis present

## 2021-03-17 DIAGNOSIS — E8729 Other acidosis: Secondary | ICD-10-CM | POA: Diagnosis present

## 2021-03-17 DIAGNOSIS — J9690 Respiratory failure, unspecified, unspecified whether with hypoxia or hypercapnia: Secondary | ICD-10-CM | POA: Diagnosis not present

## 2021-03-17 DIAGNOSIS — K760 Fatty (change of) liver, not elsewhere classified: Secondary | ICD-10-CM | POA: Diagnosis not present

## 2021-03-17 DIAGNOSIS — Z6829 Body mass index (BMI) 29.0-29.9, adult: Secondary | ICD-10-CM

## 2021-03-17 DIAGNOSIS — E1169 Type 2 diabetes mellitus with other specified complication: Secondary | ICD-10-CM | POA: Diagnosis present

## 2021-03-17 DIAGNOSIS — D6959 Other secondary thrombocytopenia: Secondary | ICD-10-CM | POA: Diagnosis present

## 2021-03-17 DIAGNOSIS — K7200 Acute and subacute hepatic failure without coma: Secondary | ICD-10-CM | POA: Diagnosis present

## 2021-03-17 DIAGNOSIS — R6521 Severe sepsis with septic shock: Secondary | ICD-10-CM | POA: Diagnosis not present

## 2021-03-17 DIAGNOSIS — R5081 Fever presenting with conditions classified elsewhere: Secondary | ICD-10-CM | POA: Diagnosis present

## 2021-03-17 DIAGNOSIS — Z20822 Contact with and (suspected) exposure to covid-19: Secondary | ICD-10-CM | POA: Diagnosis not present

## 2021-03-17 DIAGNOSIS — R7881 Bacteremia: Secondary | ICD-10-CM | POA: Diagnosis not present

## 2021-03-17 DIAGNOSIS — N170 Acute kidney failure with tubular necrosis: Secondary | ICD-10-CM | POA: Diagnosis not present

## 2021-03-17 DIAGNOSIS — J449 Chronic obstructive pulmonary disease, unspecified: Secondary | ICD-10-CM | POA: Diagnosis present

## 2021-03-17 DIAGNOSIS — Z4659 Encounter for fitting and adjustment of other gastrointestinal appliance and device: Secondary | ICD-10-CM

## 2021-03-17 DIAGNOSIS — N1411 Contrast-induced nephropathy: Secondary | ICD-10-CM | POA: Diagnosis not present

## 2021-03-17 DIAGNOSIS — A419 Sepsis, unspecified organism: Secondary | ICD-10-CM | POA: Diagnosis present

## 2021-03-17 DIAGNOSIS — K649 Unspecified hemorrhoids: Secondary | ICD-10-CM | POA: Diagnosis present

## 2021-03-17 DIAGNOSIS — Z801 Family history of malignant neoplasm of trachea, bronchus and lung: Secondary | ICD-10-CM

## 2021-03-17 DIAGNOSIS — E782 Mixed hyperlipidemia: Secondary | ICD-10-CM | POA: Diagnosis present

## 2021-03-17 DIAGNOSIS — Z7982 Long term (current) use of aspirin: Secondary | ICD-10-CM

## 2021-03-17 DIAGNOSIS — Z9912 Encounter for respirator [ventilator] dependence during power failure: Secondary | ICD-10-CM | POA: Diagnosis not present

## 2021-03-17 DIAGNOSIS — J440 Chronic obstructive pulmonary disease with acute lower respiratory infection: Secondary | ICD-10-CM | POA: Diagnosis not present

## 2021-03-17 DIAGNOSIS — Z923 Personal history of irradiation: Secondary | ICD-10-CM

## 2021-03-17 DIAGNOSIS — C159 Malignant neoplasm of esophagus, unspecified: Secondary | ICD-10-CM | POA: Diagnosis present

## 2021-03-17 DIAGNOSIS — Z87891 Personal history of nicotine dependence: Secondary | ICD-10-CM

## 2021-03-17 DIAGNOSIS — R34 Anuria and oliguria: Secondary | ICD-10-CM | POA: Diagnosis not present

## 2021-03-17 DIAGNOSIS — A414 Sepsis due to anaerobes: Secondary | ICD-10-CM | POA: Diagnosis not present

## 2021-03-17 DIAGNOSIS — D709 Neutropenia, unspecified: Secondary | ICD-10-CM

## 2021-03-17 DIAGNOSIS — Z9989 Dependence on other enabling machines and devices: Secondary | ICD-10-CM

## 2021-03-17 DIAGNOSIS — Z7984 Long term (current) use of oral hypoglycemic drugs: Secondary | ICD-10-CM

## 2021-03-17 DIAGNOSIS — R0602 Shortness of breath: Secondary | ICD-10-CM

## 2021-03-17 DIAGNOSIS — K1230 Oral mucositis (ulcerative), unspecified: Secondary | ICD-10-CM | POA: Diagnosis present

## 2021-03-17 DIAGNOSIS — J9601 Acute respiratory failure with hypoxia: Secondary | ICD-10-CM | POA: Diagnosis not present

## 2021-03-17 DIAGNOSIS — D696 Thrombocytopenia, unspecified: Secondary | ICD-10-CM | POA: Diagnosis present

## 2021-03-17 DIAGNOSIS — Z978 Presence of other specified devices: Secondary | ICD-10-CM

## 2021-03-17 DIAGNOSIS — I2510 Atherosclerotic heart disease of native coronary artery without angina pectoris: Secondary | ICD-10-CM | POA: Diagnosis present

## 2021-03-17 DIAGNOSIS — N179 Acute kidney failure, unspecified: Secondary | ICD-10-CM

## 2021-03-17 DIAGNOSIS — E1142 Type 2 diabetes mellitus with diabetic polyneuropathy: Secondary | ICD-10-CM | POA: Diagnosis present

## 2021-03-17 DIAGNOSIS — T451X5A Adverse effect of antineoplastic and immunosuppressive drugs, initial encounter: Secondary | ICD-10-CM | POA: Diagnosis present

## 2021-03-17 DIAGNOSIS — E66811 Obesity, class 1: Secondary | ICD-10-CM | POA: Diagnosis present

## 2021-03-17 DIAGNOSIS — K219 Gastro-esophageal reflux disease without esophagitis: Secondary | ICD-10-CM | POA: Diagnosis present

## 2021-03-17 DIAGNOSIS — I10 Essential (primary) hypertension: Secondary | ICD-10-CM | POA: Diagnosis present

## 2021-03-17 DIAGNOSIS — K121 Other forms of stomatitis: Secondary | ICD-10-CM | POA: Diagnosis present

## 2021-03-17 DIAGNOSIS — Z79899 Other long term (current) drug therapy: Secondary | ICD-10-CM

## 2021-03-17 DIAGNOSIS — M25512 Pain in left shoulder: Secondary | ICD-10-CM | POA: Diagnosis present

## 2021-03-17 DIAGNOSIS — D689 Coagulation defect, unspecified: Secondary | ICD-10-CM | POA: Diagnosis not present

## 2021-03-17 DIAGNOSIS — N401 Enlarged prostate with lower urinary tract symptoms: Secondary | ICD-10-CM | POA: Diagnosis present

## 2021-03-17 DIAGNOSIS — M159 Polyosteoarthritis, unspecified: Secondary | ICD-10-CM | POA: Diagnosis present

## 2021-03-17 DIAGNOSIS — E669 Obesity, unspecified: Secondary | ICD-10-CM | POA: Diagnosis present

## 2021-03-17 DIAGNOSIS — R338 Other retention of urine: Secondary | ICD-10-CM | POA: Diagnosis present

## 2021-03-17 DIAGNOSIS — E871 Hypo-osmolality and hyponatremia: Secondary | ICD-10-CM | POA: Diagnosis present

## 2021-03-17 DIAGNOSIS — Z9221 Personal history of antineoplastic chemotherapy: Secondary | ICD-10-CM

## 2021-03-17 DIAGNOSIS — G4733 Obstructive sleep apnea (adult) (pediatric): Secondary | ICD-10-CM

## 2021-03-17 DIAGNOSIS — Y95 Nosocomial condition: Secondary | ICD-10-CM | POA: Diagnosis not present

## 2021-03-17 DIAGNOSIS — D8481 Immunodeficiency due to conditions classified elsewhere: Secondary | ICD-10-CM | POA: Diagnosis present

## 2021-03-17 DIAGNOSIS — A4152 Sepsis due to Pseudomonas: Secondary | ICD-10-CM | POA: Diagnosis not present

## 2021-03-17 DIAGNOSIS — Z8249 Family history of ischemic heart disease and other diseases of the circulatory system: Secondary | ICD-10-CM

## 2021-03-17 DIAGNOSIS — C7931 Secondary malignant neoplasm of brain: Secondary | ICD-10-CM | POA: Diagnosis present

## 2021-03-17 DIAGNOSIS — E872 Acidosis, unspecified: Secondary | ICD-10-CM | POA: Diagnosis present

## 2021-03-17 HISTORY — DX: Malignant (primary) neoplasm, unspecified: C80.1

## 2021-03-17 LAB — HEMOGLOBIN A1C
Hgb A1c MFr Bld: 9.9 % — ABNORMAL HIGH (ref 4.8–5.6)
Mean Plasma Glucose: 237.43 mg/dL

## 2021-03-17 LAB — URINALYSIS, ROUTINE W REFLEX MICROSCOPIC
Bacteria, UA: NONE SEEN
Bilirubin Urine: NEGATIVE
Glucose, UA: >=500 mg/dL — AB
Hgb urine dipstick: NEGATIVE
Ketones, ur: 80 mg/dL — AB
Leukocytes,Ua: NEGATIVE
Nitrite: NEGATIVE
Protein, ur: NEGATIVE mg/dL
Specific Gravity, Urine: 1.031 — ABNORMAL HIGH (ref 1.005–1.030)
pH: 6 (ref 5.0–8.0)

## 2021-03-17 LAB — COMPREHENSIVE METABOLIC PANEL
ALT: 60 U/L — ABNORMAL HIGH (ref 0–44)
AST: 27 U/L (ref 15–41)
Albumin: 3.1 g/dL — ABNORMAL LOW (ref 3.5–5.0)
Alkaline Phosphatase: 53 U/L (ref 38–126)
Anion gap: 16 — ABNORMAL HIGH (ref 5–15)
BUN: 18 mg/dL (ref 8–23)
CO2: 20 mmol/L — ABNORMAL LOW (ref 22–32)
Calcium: 7.9 mg/dL — ABNORMAL LOW (ref 8.9–10.3)
Chloride: 100 mmol/L (ref 98–111)
Creatinine, Ser: 0.86 mg/dL (ref 0.61–1.24)
GFR, Estimated: >60 mL/min (ref >60)
Glucose, Bld: 186 mg/dL — ABNORMAL HIGH (ref 70–99)
Potassium: 4 mmol/L (ref 3.5–5.1)
Sodium: 136 mmol/L (ref 135–145)
Total Bilirubin: 2 mg/dL — ABNORMAL HIGH (ref 0.3–1.2)
Total Protein: 5.5 g/dL — ABNORMAL LOW (ref 6.5–8.1)

## 2021-03-17 LAB — MAGNESIUM: Magnesium: 2.2 mg/dL (ref 1.7–2.4)

## 2021-03-17 LAB — CBC WITH DIFFERENTIAL/PLATELET
Abs Immature Granulocytes: 0.01 10*3/uL (ref 0.00–0.07)
Basophils Absolute: 0 10*3/uL (ref 0.0–0.1)
Basophils Relative: 1 %
Eosinophils Absolute: 0 10*3/uL (ref 0.0–0.5)
Eosinophils Relative: 1 %
HCT: 48.3 % (ref 39.0–52.0)
Hemoglobin: 16.1 g/dL (ref 13.0–17.0)
Immature Granulocytes: 1 %
Lymphocytes Relative: 40 %
Lymphs Abs: 0.8 10*3/uL (ref 0.7–4.0)
MCH: 31.8 pg (ref 26.0–34.0)
MCHC: 33.3 g/dL (ref 30.0–36.0)
MCV: 95.5 fL (ref 80.0–100.0)
Monocytes Absolute: 0 10*3/uL — ABNORMAL LOW (ref 0.1–1.0)
Monocytes Relative: 1 %
Neutro Abs: 1.2 10*3/uL — ABNORMAL LOW (ref 1.7–7.7)
Neutrophils Relative %: 56 %
Platelets: 122 10*3/uL — ABNORMAL LOW (ref 150–400)
RBC: 5.06 MIL/uL (ref 4.22–5.81)
RDW: 15.4 % (ref 11.5–15.5)
WBC: 2.1 10*3/uL — ABNORMAL LOW (ref 4.0–10.5)
nRBC: 0 % (ref 0.0–0.2)

## 2021-03-17 LAB — RESP PANEL BY RT-PCR (FLU A&B, COVID) ARPGX2
Influenza A by PCR: NEGATIVE
Influenza B by PCR: NEGATIVE
SARS Coronavirus 2 by RT PCR: NEGATIVE

## 2021-03-17 LAB — C DIFFICILE QUICK SCREEN W PCR REFLEX
C Diff antigen: POSITIVE — AB
C Diff interpretation: DETECTED
C Diff toxin: POSITIVE — AB

## 2021-03-17 MED ORDER — LOPERAMIDE HCL 2 MG PO CAPS
4.0000 mg | ORAL_CAPSULE | Freq: Once | ORAL | Status: AC
  Administered 2021-03-17: 4 mg via ORAL
  Filled 2021-03-17: qty 2

## 2021-03-17 MED ORDER — SODIUM CHLORIDE 0.9 % IV SOLN: Freq: Once | INTRAVENOUS | Status: AC

## 2021-03-17 MED ORDER — CIPROFLOXACIN HCL 500 MG PO TABS: 500.0000 mg | ORAL_TABLET | Freq: Once | ORAL | Status: DC

## 2021-03-17 MED ORDER — LOPERAMIDE HCL 2 MG PO CAPS: 4.0000 mg | ORAL_CAPSULE | Freq: Once | ORAL | Status: DC

## 2021-03-17 MED ORDER — DEXAMETHASONE 0.5 MG PO TABS: 0.5000 mg | ORAL_TABLET | Freq: Every day | ORAL | Status: DC

## 2021-03-17 MED ORDER — ALUM & MAG HYDROXIDE-SIMETH 200-200-20 MG/5ML PO SUSP
15.0000 mL | Freq: Four times a day (QID) | ORAL | Status: DC | PRN
  Administered 2021-03-17 – 2021-03-18 (×2): 15 mL via ORAL
  Filled 2021-03-17 (×2): qty 30

## 2021-03-17 MED ORDER — UMECLIDINIUM BROMIDE 62.5 MCG/INH IN AEPB
1.0000 | INHALATION_SPRAY | Freq: Every day | RESPIRATORY_TRACT | Status: DC
Start: 2021-03-17 — End: 2021-03-19
  Administered 2021-03-18: 1 via RESPIRATORY_TRACT
  Filled 2021-03-17: qty 7

## 2021-03-17 MED ORDER — ATORVASTATIN CALCIUM 10 MG PO TABS
20.0000 mg | ORAL_TABLET | Freq: Every day | ORAL | Status: DC
  Administered 2021-03-17 – 2021-03-18 (×2): 20 mg via ORAL
  Filled 2021-03-17 (×2): qty 1

## 2021-03-17 MED ORDER — PROCHLORPERAZINE MALEATE 10 MG PO TABS
10.0000 mg | ORAL_TABLET | Freq: Four times a day (QID) | ORAL | Status: DC | PRN
  Administered 2021-03-18: 10 mg via ORAL
  Filled 2021-03-17: qty 1

## 2021-03-17 MED ORDER — SODIUM CHLORIDE 0.9 % IV BOLUS
1000.0000 mL | Freq: Once | INTRAVENOUS | Status: AC
  Administered 2021-03-17: 1000 mL via INTRAVENOUS

## 2021-03-17 MED ORDER — LIP MEDEX EX OINT
TOPICAL_OINTMENT | CUTANEOUS | Status: AC
  Administered 2021-03-17: 1
  Filled 2021-03-17: qty 7

## 2021-03-17 MED ORDER — ADULT MULTIVITAMIN W/MINERALS CH
1.0000 | ORAL_TABLET | Freq: Every day | ORAL | Status: DC
  Administered 2021-03-17 – 2021-03-19 (×3): 1 via ORAL
  Filled 2021-03-17 (×3): qty 1

## 2021-03-17 MED ORDER — ACETAMINOPHEN-CODEINE #3 300-30 MG PO TABS
1.0000 | ORAL_TABLET | Freq: Two times a day (BID) | ORAL | Status: DC | PRN
  Administered 2021-03-17 – 2021-03-18 (×3): 1 via ORAL
  Filled 2021-03-17 (×3): qty 1

## 2021-03-17 MED ORDER — LORATADINE 10 MG PO TABS: 10.0000 mg | ORAL_TABLET | Freq: Every day | ORAL | Status: DC | PRN

## 2021-03-17 MED ORDER — DEXAMETHASONE 0.5 MG PO TABS: 0.5000 mg | ORAL_TABLET | ORAL | Status: DC

## 2021-03-17 MED ORDER — AMLODIPINE BESYLATE 10 MG PO TABS
10.0000 mg | ORAL_TABLET | Freq: Every day | ORAL | Status: DC
  Administered 2021-03-18: 10 mg via ORAL
  Filled 2021-03-17: qty 1

## 2021-03-17 MED ORDER — FLUTICASONE PROPIONATE 50 MCG/ACT NA SUSP
1.0000 | Freq: Every day | NASAL | Status: DC | PRN
  Filled 2021-03-17: qty 16

## 2021-03-17 MED ORDER — VANCOMYCIN HCL 125 MG PO CAPS
125.0000 mg | ORAL_CAPSULE | Freq: Four times a day (QID) | ORAL | Status: DC
  Administered 2021-03-17 – 2021-03-19 (×7): 125 mg via ORAL
  Filled 2021-03-17 (×13): qty 1

## 2021-03-17 MED ORDER — METRONIDAZOLE 500 MG PO TABS: 500.0000 mg | ORAL_TABLET | Freq: Once | ORAL | Status: DC

## 2021-03-17 MED ORDER — INSULIN ASPART 100 UNIT/ML IJ SOLN
0.0000 [IU] | Freq: Three times a day (TID) | INTRAMUSCULAR | Status: DC
  Administered 2021-03-18: 2 [IU] via SUBCUTANEOUS
  Administered 2021-03-18 (×2): 3 [IU] via SUBCUTANEOUS
  Administered 2021-03-19: 6 [IU] via SUBCUTANEOUS

## 2021-03-17 MED ORDER — DEXAMETHASONE 2 MG PO TABS
2.0000 mg | ORAL_TABLET | Freq: Every day | ORAL | Status: DC
  Administered 2021-03-18 – 2021-03-19 (×2): 2 mg via ORAL
  Filled 2021-03-17 (×2): qty 1

## 2021-03-17 MED ORDER — ARFORMOTEROL TARTRATE 15 MCG/2ML IN NEBU
15.0000 ug | INHALATION_SOLUTION | Freq: Two times a day (BID) | RESPIRATORY_TRACT | Status: DC
  Administered 2021-03-17 – 2021-03-20 (×6): 15 ug via RESPIRATORY_TRACT
  Filled 2021-03-17 (×6): qty 2

## 2021-03-17 MED ORDER — FINASTERIDE 5 MG PO TABS
5.0000 mg | ORAL_TABLET | Freq: Every day | ORAL | Status: DC
  Administered 2021-03-18 – 2021-03-19 (×2): 5 mg via ORAL
  Filled 2021-03-17 (×2): qty 1

## 2021-03-17 MED ORDER — GABAPENTIN 300 MG PO CAPS
300.0000 mg | ORAL_CAPSULE | Freq: Three times a day (TID) | ORAL | Status: DC
  Administered 2021-03-17 – 2021-03-19 (×6): 300 mg via ORAL
  Filled 2021-03-17 (×6): qty 1

## 2021-03-17 MED ORDER — DEXAMETHASONE 2 MG PO TABS: 1.0000 mg | ORAL_TABLET | Freq: Every day | ORAL | Status: DC

## 2021-03-17 MED ORDER — METOPROLOL TARTRATE 25 MG PO TABS
50.0000 mg | ORAL_TABLET | Freq: Two times a day (BID) | ORAL | Status: DC
  Administered 2021-03-17 – 2021-03-18 (×2): 50 mg via ORAL
  Filled 2021-03-17 (×3): qty 1

## 2021-03-17 MED ORDER — RISAQUAD PO CAPS
2.0000 | ORAL_CAPSULE | Freq: Two times a day (BID) | ORAL | Status: DC
  Administered 2021-03-17 – 2021-03-18 (×3): 2 via ORAL
  Filled 2021-03-17 (×3): qty 2

## 2021-03-17 MED ORDER — ASPIRIN EC 81 MG PO TBEC
81.0000 mg | DELAYED_RELEASE_TABLET | Freq: Every day | ORAL | Status: DC
  Administered 2021-03-17 – 2021-03-19 (×3): 81 mg via ORAL
  Filled 2021-03-17 (×3): qty 1

## 2021-03-17 MED ORDER — ALBUTEROL SULFATE (2.5 MG/3ML) 0.083% IN NEBU
3.0000 mL | INHALATION_SOLUTION | RESPIRATORY_TRACT | Status: DC | PRN
  Filled 2021-03-17: qty 3

## 2021-03-17 MED ORDER — IOHEXOL 350 MG/ML SOLN
100.0000 mL | Freq: Once | INTRAVENOUS | Status: AC | PRN
  Administered 2021-03-17: 100 mL via INTRAVENOUS

## 2021-03-17 MED ORDER — CYCLOBENZAPRINE HCL 10 MG PO TABS: 10.0000 mg | ORAL_TABLET | Freq: Three times a day (TID) | ORAL | Status: DC | PRN

## 2021-03-17 MED ORDER — TAMSULOSIN HCL 0.4 MG PO CAPS
0.4000 mg | ORAL_CAPSULE | Freq: Every day | ORAL | Status: DC
  Administered 2021-03-17 – 2021-03-19 (×3): 0.4 mg via ORAL
  Filled 2021-03-17 (×3): qty 1

## 2021-03-17 MED ORDER — DAPAGLIFLOZIN PROPANEDIOL 10 MG PO TABS
10.0000 mg | ORAL_TABLET | Freq: Every day | ORAL | Status: DC
  Filled 2021-03-17: qty 1

## 2021-03-17 MED ORDER — LIDOCAINE VISCOUS HCL 2 % MT SOLN
15.0000 mL | Freq: Four times a day (QID) | OROMUCOSAL | Status: DC | PRN
  Filled 2021-03-17 (×2): qty 15

## 2021-03-17 NOTE — H&P (Signed)
History and Physical    Jason Snyder MWU:[id number] DOB: 1950/11/09 DOA: 03/16/2021  PCP: [name], MD  Patient coming from: home  Chief Complaint: Diarrhea  HPI: Jason Snyder is a 70 y.o. 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 Presenting with diarrhea. Two days ago, he reports that he didn't "feel right" in general. He had no specific symptoms, but just felt off. Yesterday morning he woke up with diarrhea. From that point, he seemed to have a loose BM every 2 hours. It did not respond to 4 doses of imodium. He had some LLQ cramping that he thought was gas pain initially. It did not respond to simethicone. He had nausea, but no vomiting. When his symptoms continued through this morning, he decided to come to the ED for help.    ED Course: He was found to be c diff positive. CT ab/pelvis showed some colon wall thickening. He was started on vanc. [facility] was called for admission.   Review of Systems:  Denies CP, dyspnea, V, fever, sick contacts, palpitations, lightheadedness, dizziness. Reports ab pain and diarrhea. Review of systems is otherwise negative for all not mentioned in HPI.   PMHx Past Medical History:  Diagnosis Date   BMI 30.0-30.9,adult    BPH without obstruction/lower urinary tract symptoms 10/03/2017   CAD (coronary artery disease)    cath 2020, single vessel disease, medical mgt   Cancer [facility])    Chronic left shoulder pain c   Chronic low back pain with right-sided sciatica    Chronic pain 10/07/2013   Combined hyperlipidemia associated with type 2 diabetes mellitus ([place]) 07/09/2019   Controlled type 2 diabetes mellitus without complication, without long-term current use of insulin ([place]) 07/12/2014   COPD (chronic obstructive pulmonary disease) ([place]) 01/07/2014   PFTs 07/2018 minimal airway obstruction, overinflation and no response to bronchodilators.    Coronary artery disease involving native coronary artery  of native heart without angina pectoris    Cardiac Cath 01/2019: Severe single-vessel disease with additional moderate two-vessel disease: 100% CTO of RCA, moderate disease in proximal LCx, distal LCx and very small caliber 1st Diag DFR-FFR negative lesion in proximal LCx Normal LV pressures with previously document, ed normal EF.   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   Hypogonadism - pineal glad abnormalities On testosterone replacement   Obesity (BMI 30-39.9)    Obesity (BMI 30.0-34.9) 10/03/2017   OSA on CPAP 10/07/2013   Primary osteoarthritis involving multiple joints 01/10/2015   Rosacea    Smoking greater than 30 pack years - quit 03/2018 10/03/2017   Thoracic and lumbosacral neuritis    Type 2 diabetes mellitus with peripheral neuropathy ([place]) 07/12/2014    PSHx Past Surgical History:  Procedure Laterality Date   ARTHROSCOPIC REPAIR ACL     BIOPSY  01/26/2021   Procedure: BIOPSY;  Surgeon: Thornton Park, MD;  Location: [place];  Service: Gastroenterology;;   CARDIAC CATHETERIZATION     ESOPHAGOGASTRODUODENOSCOPY (EGD) WITH PROPOFOL N/A 01/26/2021   Procedure: ESOPHAGOGASTRODUODENOSCOPY (EGD)  WITH PROPOFOL;  Surgeon: Thornton Park, MD;  Location: [place];  Service: Gastroenterology;  Laterality: N/A;   INTRAVASCULAR PRESSURE WIRE/FFR STUDY N/A 01/01/2019   Procedure: INTRAVASCULAR PRESSURE WIRE/FFR STUDY;  Surgeon: Leonie Man, MD;  Location: [place] [facility];  Service: Cardiovascular;  Laterality: N/A;   IR IMAGING  GUIDED PORT INSERTION  02/12/2021   LEFT HEART CATH AND CORONARY ANGIOGRAPHY N/A 01/01/2019   Procedure: LEFT HEART CATH AND CORONARY ANGIOGRAPHY;  Surgeon: Leonie Man, MD;  Location: [place] [facility];  Service: Cardiovascular;  Laterality: N/A;   PROSTATE CRYOABLATION     03/2019   SHOULDER ARTHROSCOPY     SPINE SURGERY      SocHx  reports that he quit smoking about 3 years ago. His smoking use included cigarettes. He has a 15.00 pack-year smoking history. He has never used smokeless tobacco. He reports that he does not currently use alcohol. He reports that he does not currently use drugs.  Allergies  Allergen Reactions   Rubus Fruticosus Rash    Blackberries    FamHx Family History  Problem Relation Age of Onset   Pulmonary embolism Mother    Heart disease Father    Lung cancer Father    Lung cancer Sister    Heart disease Maternal Uncle     Prior to Admission medications   Medication Sig Start Date End Date Taking? Authorizing Provider  acetaminophen-codeine (TYLENOL #3) 300-30 MG tablet take 1 tablet by mouth twice a day as needed for moderate pain Patient taking differently: Take 1 tablet by mouth 2 (two) times daily as needed for moderate pain. 03/06/21  Yes [name], MD  albuterol (VENTOLIN HFA) 108 (90 Base) MCG/ACT inhaler INHALE TWO PUFFS BY MOUTH INTO THE LUNGS EVERY FOUR HOURS AS NEEDED FOR WHEEZING Patient taking differently: Inhale 2 puffs into the lungs every 4 (four) hours as needed for wheezing. 02/19/21  Yes [name], MD  amLODipine (NORVASC) 10 MG tablet Take 1 tablet (10 mg total) by mouth daily. 01/26/21  Yes Simmons-Robinson, Makiera, MD  aspirin 81 MG EC tablet Take 81 mg by mouth daily.    Yes [provider]  atorvastatin (LIPITOR) 20 MG tablet Take 1 tablet (20 mg total) by mouth at bedtime. 07/12/20  Yes [name], MD  capecitabine (XELODA) 500 MG tablet Take 4 tablets (2,000 mg total) by mouth 2 (two) times daily. Take 14  days on, 7 days off, repeat every 21 days. 02/28/21  Yes Truitt Merle, MD  cyclobenzaprine (FLEXERIL) 10 MG tablet TAKE ONE TABLET BY MOUTH THREE TIMES DAILY AS NEEDED Patient taking differently: Take 10 mg by mouth 3 (three) times daily as needed for muscle spasms. 03/05/21  Yes [name], MD  dexamethasone (DECADRON) 2 MG tablet Taper to 2 tablets ($RemoveBe'4mg'[id number]$ ) twice daily until instructed otherwise. Patient taking differently: Take 0.5-4 mg by mouth as directed. Taper to 2 tablets ($RemoveBe'4mg'ciJiWUsuh$ ) twice daily ,thenTaking 2 mg daily for 1 week, then 1 mg daily for 1 week , then 0.5 mg daily for 1 week. 03/05/21  Yes Eppie Gibson, MD  diclofenac sodium (VOLTAREN) 1 % GEL Apply 2 g topically 4 (four) times daily as needed (pain). 04/21/17  Yes [provider]  FARXIGA 10 MG TABS tablet TAKE ONE TABLET BY MOUTH ONE TIME DAILY Patient taking differently: Take 10 mg by mouth daily. 08/28/20  Yes [name], MD  finasteride (PROSCAR) 5 MG tablet Take 5 mg by mouth  daily. 02/13/21  Yes [provider]  fluticasone (FLONASE) 50 MCG/ACT nasal spray Place 1 spray into the nose daily as needed for allergies.  04/09/13  Yes [provider]  gabapentin (NEURONTIN) 300 MG capsule Take 1 capsule (300 mg total) by mouth 3 (three) times daily. 01/11/21  Yes [name], MD  Ketotifen Fumarate (ALLERGY EYE DROPS OP) Place 1 drop into both eyes daily as needed (allergy).   Yes [provider]  lidocaine (XYLOCAINE) 2 % solution Mix 1 part 2% viscous lidocaine with 1 part water. Swish and swallow 53mL of diluted mixture --use 30 minutes before meals and at bedtime, up to 4 times a day 03/05/21  Yes Eppie Gibson, MD  lidocaine-prilocaine (EMLA) cream Apply 1 application topically as needed. Patient taking differently: Apply 1 application topically as needed (for treatments). 02/02/21  Yes Heilingoetter, Cassandra L, PA-C  Loratadine 10 MG CAPS Take 10 mg by mouth daily as needed (allergies).    Yes  [provider]  metFORMIN (GLUCOPHAGE) 500 MG tablet Take 1 tablet (500 mg total) by mouth 2 (two) times daily with a meal. 01/11/21  Yes [name], MD  metoprolol tartrate (LOPRESSOR) 50 MG tablet Take 1 tablet (50 mg total) by mouth 2 (two) times daily. 04/11/20  Yes [name], MD  Multiple Vitamin (MULTIVITAMIN) capsule Take 1 capsule by mouth daily.    Yes [provider]  nitroGLYCERIN (NITROSTAT) 0.4 MG SL tablet Place 1 tablet (0.4 mg total) under the tongue every 5 (five) minutes as needed for chest pain. 08/28/20  Yes Richardo Priest, MD  ondansetron (ZOFRAN) 8 MG tablet Take 1 tablet (8 mg total) by mouth 2 (two) times daily as needed for refractory nausea / vomiting. Start on day 3 after chemotherapy. 02/12/21  Yes Truitt Merle, MD  pantoprazole (PROTONIX) 40 MG tablet Take 1 tablet (40 mg total) by mouth daily. 02/21/21  Yes Truitt Merle, MD  prochlorperazine (COMPAZINE) 10 MG tablet Take 1 tablet (10 mg total) by mouth every 6 (six) hours as needed (Nausea or vomiting). 02/12/21  Yes Truitt Merle, MD  Spacer/Aero-Holding Chambers (AEROCHAMBER MV) inhaler Use as instructed 11/20/20  Yes Magdalen Spatz, NP  tamsulosin (FLOMAX) 0.4 MG CAPS capsule TAKE TWO CAPSULES BY MOUTH DAILY Patient taking differently: Take 0.4 mg by mouth daily. 03/02/20  Yes [name], MD  Tiotropium Bromide-Olodaterol (STIOLTO RESPIMAT) 2.5-2.5 MCG/ACT AERS Inhale 2 puffs into the lungs daily. 10/10/20  Yes Mannam, Hart Robinsons, MD  Accu-Chek Softclix Lancets lancets Use as directed up to 4 times daily 01/26/21   Simmons-Robinson, Makiera, MD  acetic acid 2 % otic solution Place 4 drops into both ears 2 (two) times daily as needed. Patient not taking: Reported on 03/16/2021 10/06/19   [name], MD  Blood Glucose Monitoring Suppl (BLOOD GLUCOSE MONITOR SYSTEM) w/Device KIT Please check blood glucose three times daily (before meals in the morning and 2 hours after largest meals of the day OR lunch and  dinner) 01/26/21   Simmons-Robinson, Riki Sheer, MD  glucose blood (ACCU-CHEK GUIDE) test strip Use as instructed up to 4 times daily 01/26/21   Simmons-Robinson, Makiera, MD  umeclidinium-vilanterol (ANORO ELLIPTA) 62.5-25 MCG/INH AEPB Inhale 1 puff into the lungs daily. 03/06/21   Marshell Garfinkel, MD    Physical Exam: Vitals:   03/28/2021 1200 03/19/2021 1230 03/10/2021 1330 03/16/2021 1400  BP: (!) 145/87 (!) 134/91 140/88 (!) 143/91  Pulse: 87 86 88 87  Resp: (!) 23  16 (!) 28 (!) 25  Temp:      TempSrc:      SpO2: 96% 96% 97% 98%    General: 70 y.o. male resting in bed in NAD Eyes: PERRL, normal sclera ENMT: Nares patent w/o discharge, orophaynx clear, dentition normal, ears w/o discharge/lesions/ulcers Neck: Supple, trachea midline Cardiovascular: RRR, +S1, S2, no m/g/r, equal pulses throughout Respiratory: CTABL, no w/r/r, normal WOB GI: BS+, ND, soft, mild LLQ TTP, no masses noted, no organomegaly noted MSK: No e/c/c Skin: No rashes, bruises, ulcerations noted Neuro: A&O x 3, no focal deficits Psyc: Appropriate interaction and affect, calm/cooperative  Labs on Admission: I have personally reviewed following labs and imaging studies  CBC: Recent Labs  Lab 03/26/2021 1046  WBC 2.1*  NEUTROABS 1.2*  HGB 16.1  HCT 48.3  MCV 95.5  PLT 004*   Basic Metabolic Panel: Recent Labs  Lab 03/29/2021 1046  NA 136  K 4.0  CL 100  CO2 20*  GLUCOSE 186*  BUN 18  CREATININE 0.86  CALCIUM 7.9*  MG 2.2   GFR: Estimated Creatinine Clearance: 107.3 mL/min (by C-G formula based on SCr of 0.86 mg/dL). Liver Function Tests: Recent Labs  Lab 03/15/2021 1046  AST 27  ALT 60*  ALKPHOS 53  BILITOT 2.0*  PROT 5.5*  ALBUMIN 3.1*   No results for input(s): LIPASE, AMYLASE in the last 168 hours. No results for input(s): AMMONIA in the last 168 hours. Coagulation Profile: No results for input(s): INR, PROTIME in the last 168 hours. Cardiac Enzymes: No results for input(s): CKTOTAL, CKMB,  CKMBINDEX, TROPONINI in the last 168 hours. BNP (last 3 results) No results for input(s): PROBNP in the last 8760 hours. HbA1C: No results for input(s): HGBA1C in the last 72 hours. CBG: No results for input(s): GLUCAP in the last 168 hours. Lipid Profile: No results for input(s): CHOL, HDL, LDLCALC, TRIG, CHOLHDL, LDLDIRECT in the last 72 hours. Thyroid Function Tests: No results for input(s): TSH, T4TOTAL, FREET4, T3FREE, THYROIDAB in the last 72 hours. Anemia Panel: No results for input(s): VITAMINB12, FOLATE, FERRITIN, TIBC, IRON, RETICCTPCT in the last 72 hours. Urine analysis:    Component Value Date/Time   COLORURINE YELLOW 03/18/2021 1046   APPEARANCEUR CLEAR 03/22/2021 1046   LABSPEC 1.031 (H) 03/29/2021 1046   PHURINE 6.0 03/06/2021 1046   GLUCOSEU >=500 (A) 03/03/2021 1046   HGBUR NEGATIVE 03/07/2021 [place] 03/21/2021 1046   KETONESUR 80 (A) 03/19/2021 1046   PROTEINUR NEGATIVE 03/18/2021 1046   NITRITE NEGATIVE 03/16/2021 [place] 03/15/2021 1046    Radiological Exams on Admission: CT Abdomen Pelvis W Contrast  Result Date: 03/19/2021 CLINICAL DATA:  Metastatic esophageal cancer. Left lower quadrant pain and nausea. EXAM: CT ABDOMEN AND PELVIS WITH CONTRAST TECHNIQUE: Multidetector CT imaging of the abdomen and pelvis was performed using the standard protocol following bolus administration of intravenous contrast. CONTRAST:  162mL OMNIPAQUE IOHEXOL 350 MG/ML SOLN COMPARISON:  CT abdomen pelvis 01/22/2021 FINDINGS: Lower chest: Tiny anterior pericardial effusion, similar to prior chest CT. Hepatobiliary: Small low-attenuation lesion again noted in the patent dome. There is also a rim enhancing mass adjacent to the gallbladder fossa in the anterior liver, with surrounding increased sclerosis. There is no new focal liver lesion identified. Hepatic steatosis. There is new mild pericholecystic fat stranding. Which is nonspecific. No  gallstones identified. The Pancreas: Stable 3.0 cm cystic mass in the pancreatic head. Diffuse fatty atrophy of the pancreas. Spleen: Normal in size without  focal abnormality. Adrenals/Urinary Tract: Adrenal glands are unremarkable. Kidneys are normal, without renal calculi, focal lesion, or hydronephrosis. Bladder is unremarkable. Stomach/Bowel: Stomach is within normal limits. There is diffuse bowel wall thickening throughout the entire colon. No dilation to suggest obstruction. Small bowel is unremarkable. Vascular/Lymphatic: Aortic atherosclerosis. No enlarged abdominal or pelvic lymph nodes. Reproductive: Prostate is borderline enlarged. Other: No abdominal wall hernia or abnormality. No abdominopelvic ascites. Musculoskeletal: Stable lucent lesion in the right hemipelvis. Multilevel degenerative disc disease in the lumbar spine. No acute finding. IMPRESSION: 1. There is diffuse bowel wall thickening throughout the entire colon, consistent with colitis, which may be infectious or inflammatory in etiology. 2. There is new mild pericholecystic fat stranding which is nonspecific. No gallstones identified. Further evaluation with right upper quadrant ultrasound may be considered. 3. Stable 3.0 cm cystic mass in the pancreatic head. 4. Hepatic steatosis. Multiple liver lesions, better characterized on recent MRI. Aortic Atherosclerosis (ICD10-I70.0). Electronically Signed   By: Audie Pinto M.D.   On: 03/29/2021 12:05    EKG: None obtained in ED  Assessment/Plan C diff colitis Nausea     - place in obs, med-surg     - continue PO vanc     - add probiotic, anti-emetics, and fluids  HTN CAD HLD     - continue home regimen  DM2     - DM diet, A1c, SSI, glucose checks  GERD     - hold PPI for now  COPD     - continue home inhalers  Esophageal CA w/ brain mets on chemo/radiation Hx of cholangiocarcinoma Thrombocytopenia Leukopenia     - on radiation/chemo w/ Drs Mickeal Skinner and Burr Medico     -  holding chemo for now      - no evidence of bleed     - continue outpt follow up  DVT prophylaxis: SCDs  Code Status: FULL  Family Communication: None at beside  Consults called: None   Status is: Observation  The patient remains OBS appropriate and will d/c before 2 midnights.   Jonnie Finner DO [facility]  If 7PM-7AM, please contact night-coverage [email or web address]  03/16/2021, 2:10 PM

## 2021-03-17 NOTE — ED Triage Notes (Signed)
PT c/o diarrhea x24 hours. Last chemo 10/12. Denies N/V.

## 2021-03-17 NOTE — ED Provider Notes (Signed)
Emergency Medicine Provider Triage Evaluation Note  Jason Snyder , a 70 y.o. male  was evaluated in triage.  Pt complains of diarrhea for the past 24 hours with nausea and intermittent LLQ abdominal cramping. Denies any vomiting. The patient has brain, esophageal, liver, and pancreas cancer with recent radiation and is currently on chemotherapy. Denies any fevers, travel, or new foods.   Review of Systems  Positive: Abdominal pain, diarrhea, nausea Negative: Fever, vomiting, chest pain, SOB, melena, hematochezia  Physical Exam  BP 119/75 (BP Location: Left Arm)   Pulse 95   Temp 97.6 F (36.4 C) (Oral)   Resp 18   SpO2 94%  Gen:   Awake, no distress   Resp:  Normal effort  MSK:   Moves extremities without difficulty  Other:  Abdomen soft. Mild LLQ tenderness. No peritoneal signs  Medical Decision Making  Medically screening exam initiated at 10:10 AM.  Appropriate orders placed.  Jason Snyder was informed that the remainder of the evaluation will be completed by another provider, this initial triage assessment does not replace that evaluation, and the importance of remaining in the ED until their evaluation is complete.  Labs and imaging ordered. Nursing aware that patient is next to room.    Sherrell Puller, PA-C 03/07/2021 [place]    Wyvonnia Dusky, MD 03/13/2021 [phone]

## 2021-03-17 NOTE — ED Notes (Signed)
Pt ambulatory to restroom

## 2021-03-17 NOTE — Plan of Care (Signed)
  Problem: Activity: Goal: Risk for activity intolerance will decrease Outcome: Progressing   Problem: Nutrition: Goal: Adequate nutrition will be maintained Outcome: Progressing   Problem: Pain Managment: Goal: General experience of comfort will improve Outcome: Progressing   Problem: Safety: Goal: Ability to remain free from injury will improve Outcome: Progressing   

## 2021-03-17 NOTE — ED Provider Notes (Signed)
[facility] DEPT Provider Note   CSN: [id number] Arrival date & time: 03/13/2021  5701     History Chief Complaint  Patient presents with   Diarrhea    Jason Snyder is a 70 y.o. 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  Patient reports he has had significant diarrhea beginning yesterday.  He reports cramping abdominal pain in her left lower quadrant of the abdomen.  He is not currently on antibiotics he denies any known history of C. difficile.  He says he had about 10 loose bowel movements yesterday continues to have them about once every 2-3 hours.  It is mostly water and less formed stool at this point.  He describes he has some hemorrhoids and a little bit of bright red blood when he wipes, but no significant bleeding in the toilet.  He reports some very mild nausea but denies vomiting.  He reports his abdominal cramping pain is very mild at this time, nothing makes it better or worse, tends to come and go, is located in the left lower quadrant does not travel anywhere.  He is here with his wife at bedside.  He was tried loperamide at home all day yesterday, up to 4 doses, and reports no improvement of his diarrhea.  HPI     Past Medical History:  Diagnosis Date   BMI 30.0-30.9,adult    BPH without obstruction/lower urinary tract symptoms 10/03/2017   CAD (coronary artery disease)    cath 2020, single vessel disease, medical mgt   Cancer [facility])    Chronic left shoulder pain c   Chronic low back pain with right-sided sciatica    Chronic pain 10/07/2013   Combined hyperlipidemia associated with type 2 diabetes mellitus ([place]) 07/09/2019   Controlled type 2 diabetes mellitus without complication, without long-term current use of insulin ([place]) 07/12/2014   COPD (chronic obstructive pulmonary  disease) ([place]) 01/07/2014   PFTs 07/2018 minimal airway obstruction, overinflation and no response to bronchodilators.    Coronary artery disease involving native coronary artery of native heart without angina pectoris    Cardiac Cath 01/2019: Severe single-vessel disease with additional moderate two-vessel disease: 100% CTO of RCA, moderate disease in proximal LCx, distal LCx and very small caliber 1st Diag DFR-FFR negative lesion in proximal LCx Normal LV pressures with previously document, ed normal EF.   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 difficile colitis 03/09/2021  Mucositis due to antineoplastic therapy 03/05/2021   Port-A-Cath in place 02/13/2021   Brain metastases ([place]) 02/02/2021   Esophageal cancer ([place]) 02/02/2021   Goals of care, counseling/discussion 02/02/2021   Acute esophagitis    Esophageal mass    Esophageal dysphagia    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 structures ([place]) 01/23/2021   Brain mass 01/22/2021   Thoracic and lumbosacral neuritis    Obesity  (BMI 30-39.9)    Hypertension    Hyperlipidemia    History of lumbar surgery    Diabetes mellitus without complication ([place])    DDD (degenerative disc disease), lumbar    Chronic low back pain with right-sided sciatica    Chronic left shoulder pain    CAD (coronary artery disease)    BMI 30.0-30.9,adult    Combined hyperlipidemia associated with type 2 diabetes mellitus ([place]) 07/09/2019   Coronary artery disease involving native coronary artery of native heart without angina pectoris    Encounter for screening for lung cancer 11/09/2018   Rosacea 10/03/2017   Smoking greater than 30 pack years - quit 03/2018 10/03/2017   BPH without obstruction/lower urinary tract symptoms 10/03/2017   Gastroesophageal reflux disease 01/10/2015   Primary osteoarthritis involving multiple joints 01/10/2015   Type 2 diabetes mellitus with peripheral neuropathy ([place]) 07/12/2014   Controlled type 2 diabetes mellitus without complication, without long-term current use of insulin ([place]) 07/12/2014   COPD (chronic obstructive pulmonary disease) ([place]) 01/07/2014   Chronic pain 10/07/2013   OSA on CPAP 10/07/2013   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   Essential hypertension 08/31/2010   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    Past Surgical History:  Procedure Laterality Date   ARTHROSCOPIC REPAIR ACL     BIOPSY  01/26/2021   Procedure: BIOPSY;  Surgeon: Thornton Park, MD;  Location: [facility];  Service: Gastroenterology;;   CARDIAC CATHETERIZATION     ESOPHAGOGASTRODUODENOSCOPY (EGD) WITH PROPOFOL N/A 01/26/2021   Procedure: ESOPHAGOGASTRODUODENOSCOPY (EGD) WITH PROPOFOL;  Surgeon: Thornton Park, MD;  Location: [facility];  Service: Gastroenterology;  Laterality: N/A;   INTRAVASCULAR PRESSURE WIRE/FFR STUDY N/A 01/01/2019   Procedure: INTRAVASCULAR PRESSURE WIRE/FFR STUDY;  Surgeon: Leonie Man, MD;  Location: [facility];  Service: Cardiovascular;  Laterality: N/A;   IR IMAGING GUIDED PORT  INSERTION  02/12/2021   LEFT HEART CATH AND CORONARY ANGIOGRAPHY N/A 01/01/2019   Procedure: LEFT HEART CATH AND CORONARY ANGIOGRAPHY;  Surgeon: Leonie Man, MD;  Location: [facility];  Service: Cardiovascular;  Laterality: N/A;   PROSTATE CRYOABLATION     03/2019   SHOULDER ARTHROSCOPY     SPINE SURGERY         Family History  Problem Relation Age of Onset   Pulmonary embolism Mother    Heart disease Father    Lung cancer Father    Lung cancer Sister    Heart disease Maternal Uncle     Social History   Tobacco Use   Smoking status: Former    Packs/day: 0.50    Years: 30.00    Pack years: 15.00    Types: Cigarettes    Quit date: 03/05/2018    Years since quitting: 3.0   Smokeless tobacco: Never   Tobacco comments:    no cigarettes since 03/05/18  Vaping Use   Vaping Use: Never used  Substance Use Topics   Alcohol use: Not Currently   Drug use: Not Currently    Home Medications Prior to Admission medications   Medication  Sig Start Date End Date Taking? Authorizing Provider  acetaminophen-codeine (TYLENOL #3) 300-30 MG tablet take 1 tablet by mouth twice a day as needed for moderate pain Patient taking differently: Take 1 tablet by mouth 2 (two) times daily as needed for moderate pain. 03/06/21  Yes [name], MD  albuterol (VENTOLIN HFA) 108 (90 Base) MCG/ACT inhaler INHALE TWO PUFFS BY MOUTH INTO THE LUNGS EVERY FOUR HOURS AS NEEDED FOR WHEEZING Patient taking differently: Inhale 2 puffs into the lungs every 4 (four) hours as needed for wheezing. 02/19/21  Yes [name], MD  amLODipine (NORVASC) 10 MG tablet Take 1 tablet (10 mg total) by mouth daily. 01/26/21  Yes Simmons-Robinson, Makiera, MD  aspirin 81 MG EC tablet Take 81 mg by mouth daily.    Yes [provider]  atorvastatin (LIPITOR) 20 MG tablet Take 1 tablet (20 mg total) by mouth at bedtime. 07/12/20  Yes [name], MD  capecitabine (XELODA) 500 MG tablet Take 4 tablets  (2,000 mg total) by mouth 2 (two) times daily. Take 14 days on, 7 days off, repeat every 21 days. 02/28/21  Yes Truitt Merle, MD  cyclobenzaprine (FLEXERIL) 10 MG tablet TAKE ONE TABLET BY MOUTH THREE TIMES DAILY AS NEEDED Patient taking differently: Take 10 mg by mouth 3 (three) times daily as needed for muscle spasms. 03/05/21  Yes [name], MD  dexamethasone (DECADRON) 2 MG tablet Taper to 2 tablets (38m) twice daily until instructed otherwise. Patient taking differently: Take 0.5-4 mg by mouth as directed. Taper to 2 tablets (431m twice daily ,thenTaking 2 mg daily for 1 week, then 1 mg daily for 1 week , then 0.5 mg daily for 1 week. 03/05/21  Yes SqEppie GibsonMD  diclofenac sodium (VOLTAREN) 1 % GEL Apply 2 g topically 4 (four) times daily as needed (pain). 04/21/17  Yes [provider]  FARXIGA 10 MG TABS tablet TAKE ONE TABLET BY MOUTH ONE TIME DAILY Patient taking differently: Take 10 mg by mouth daily. 08/28/20  Yes AnLeamon ArntMD  finasteride (PROSCAR) 5 MG tablet Take 5 mg by mouth daily. 02/13/21  Yes [provider]  fluticasone (FLONASE) 50 MCG/ACT nasal spray Place 1 spray into the nose daily as needed for allergies.  04/09/13  Yes [provider]  gabapentin (NEURONTIN) 300 MG capsule Take 1 capsule (300 mg total) by mouth 3 (three) times daily. 01/11/21  Yes AnLeamon ArntMD  Ketotifen Fumarate (ALLERGY EYE DROPS OP) Place 1 drop into both eyes daily as needed (allergy).   Yes [provider]  lidocaine (XYLOCAINE) 2 % solution Mix 1 part 2% viscous lidocaine with 1 part water. Swish and swallow 1040mf diluted mixture --use 30 minutes before meals and at bedtime, up to 4 times a day 03/05/21  Yes SquEppie GibsonD  lidocaine-prilocaine (EMLA) cream Apply 1 application topically as needed. Patient taking differently: Apply 1 application topically as needed (for treatments). 02/02/21  Yes Heilingoetter, Cassandra L, PA-C  Loratadine 10 MG CAPS  Take 10 mg by mouth daily as needed (allergies).    Yes [provider]  metFORMIN (GLUCOPHAGE) 500 MG tablet Take 1 tablet (500 mg total) by mouth 2 (two) times daily with a meal. 01/11/21  Yes AndLeamon ArntD  metoprolol tartrate (LOPRESSOR) 50 MG tablet Take 1 tablet (50 mg total) by mouth 2 (two) times daily. 04/11/20  Yes AndLeamon ArntD  Multiple Vitamin (MULTIVITAMIN) capsule Take 1 capsule  by mouth daily.    Yes [provider]  nitroGLYCERIN (NITROSTAT) 0.4 MG SL tablet Place 1 tablet (0.4 mg total) under the tongue every 5 (five) minutes as needed for chest pain. 08/28/20  Yes Richardo Priest, MD  ondansetron (ZOFRAN) 8 MG tablet Take 1 tablet (8 mg total) by mouth 2 (two) times daily as needed for refractory nausea / vomiting. Start on day 3 after chemotherapy. 02/12/21  Yes Truitt Merle, MD  pantoprazole (PROTONIX) 40 MG tablet Take 1 tablet (40 mg total) by mouth daily. 02/21/21  Yes Truitt Merle, MD  prochlorperazine (COMPAZINE) 10 MG tablet Take 1 tablet (10 mg total) by mouth every 6 (six) hours as needed (Nausea or vomiting). 02/12/21  Yes Truitt Merle, MD  Spacer/Aero-Holding Chambers (AEROCHAMBER MV) inhaler Use as instructed 11/20/20  Yes Magdalen Spatz, NP  tamsulosin (FLOMAX) 0.4 MG CAPS capsule TAKE TWO CAPSULES BY MOUTH DAILY Patient taking differently: Take 0.4 mg by mouth daily. 03/02/20  Yes [name], MD  Tiotropium Bromide-Olodaterol (STIOLTO RESPIMAT) 2.5-2.5 MCG/ACT AERS Inhale 2 puffs into the lungs daily. 10/10/20  Yes Mannam, Hart Robinsons, MD  Accu-Chek Softclix Lancets lancets Use as directed up to 4 times daily 01/26/21   Simmons-Robinson, Makiera, MD  acetic acid 2 % otic solution Place 4 drops into both ears 2 (two) times daily as needed. Patient not taking: Reported on 03/29/2021 10/06/19   [name], MD  Blood Glucose Monitoring Suppl (BLOOD GLUCOSE MONITOR SYSTEM) w/Device KIT Please check blood glucose three times daily (before meals in the morning  and 2 hours after largest meals of the day OR lunch and dinner) 01/26/21   Simmons-Robinson, Riki Sheer, MD  glucose blood (ACCU-CHEK GUIDE) test strip Use as instructed up to 4 times daily 01/26/21   Simmons-Robinson, Makiera, MD  umeclidinium-vilanterol (ANORO ELLIPTA) 62.5-25 MCG/INH AEPB Inhale 1 puff into the lungs daily. 03/06/21   Marshell Garfinkel, MD    Allergies    Rubus fruticosus  Review of Systems   Review of Systems  Constitutional:  Negative for chills and fever.  HENT:  Negative for ear pain and sore throat.   Eyes:  Negative for pain and visual disturbance.  Respiratory:  Negative for cough and shortness of breath.   Cardiovascular:  Negative for chest pain and palpitations.  Gastrointestinal:  Positive for abdominal pain, diarrhea and nausea. Negative for constipation and vomiting.  Genitourinary:  Negative for dysuria and hematuria.  Musculoskeletal:  Negative for arthralgias and back pain.  Skin:  Negative for color change and rash.  Neurological:  Negative for seizures and syncope.  All other systems reviewed and are negative.  Physical Exam Updated Vital Signs BP 112/83   Pulse 92   Temp 97.6 F (36.4 C) (Oral)   Resp 18   SpO2 98%   Physical Exam Constitutional:      General: He is not in acute distress. HENT:     Head: Normocephalic and atraumatic.  Eyes:     Conjunctiva/sclera: Conjunctivae normal.     Pupils: Pupils are equal, round, and reactive to light.  Cardiovascular:     Rate and Rhythm: Normal rate and regular rhythm.     Comments: Chemo port right chest wall Pulmonary:     Effort: Pulmonary effort is normal. No respiratory distress.  Abdominal:     General: There is no distension.     Tenderness: There is abdominal tenderness in the left lower quadrant. There is no right CVA tenderness, left CVA tenderness,  guarding or rebound. Negative signs include Murphy's sign.  Skin:    General: Skin is warm and dry.  Neurological:     General: No focal  deficit present.     Mental Status: He is alert. Mental status is at baseline.  Psychiatric:        Mood and Affect: Mood normal.        Behavior: Behavior normal.    ED Results / Procedures / Treatments   Labs (all labs ordered are listed, but only abnormal results are displayed) Labs Reviewed  C DIFFICILE QUICK SCREEN W PCR REFLEX   - Abnormal; Notable for the following components:      Result Value   C Diff antigen POSITIVE (*)    C Diff toxin POSITIVE (*)    All other components within normal limits  COMPREHENSIVE METABOLIC PANEL - Abnormal; Notable for the following components:   CO2 20 (*)    Glucose, Bld 186 (*)    Calcium 7.9 (*)    Total Protein 5.5 (*)    Albumin 3.1 (*)    ALT 60 (*)    Total Bilirubin 2.0 (*)    Anion gap 16 (*)    All other components within normal limits  CBC WITH DIFFERENTIAL/PLATELET - Abnormal; Notable for the following components:   WBC 2.1 (*)    Platelets 122 (*)    Neutro Abs 1.2 (*)    Monocytes Absolute 0.0 (*)    All other components within normal limits  URINALYSIS, ROUTINE W REFLEX MICROSCOPIC - Abnormal; Notable for the following components:   Specific Gravity, Urine 1.031 (*)    Glucose, UA >=500 (*)    Ketones, ur 80 (*)    All other components within normal limits  RESP PANEL BY RT-PCR (FLU A&B, COVID) [id number]  MAGNESIUM    EKG None  Radiology CT Abdomen Pelvis W Contrast  Result Date: 03/16/2021 CLINICAL DATA:  Metastatic esophageal cancer. Left lower quadrant pain and nausea. EXAM: CT ABDOMEN AND PELVIS WITH CONTRAST TECHNIQUE: Multidetector CT imaging of the abdomen and pelvis was performed using the standard protocol following bolus administration of intravenous contrast. CONTRAST:  177m OMNIPAQUE IOHEXOL 350 MG/ML SOLN COMPARISON:  CT abdomen pelvis 01/22/2021 FINDINGS: Lower chest: Tiny anterior pericardial effusion, similar to prior chest CT. Hepatobiliary: Small low-attenuation lesion again noted in the patent  dome. There is also a rim enhancing mass adjacent to the gallbladder fossa in the anterior liver, with surrounding increased sclerosis. There is no new focal liver lesion identified. Hepatic steatosis. There is new mild pericholecystic fat stranding. Which is nonspecific. No gallstones identified. The Pancreas: Stable 3.0 cm cystic mass in the pancreatic head. Diffuse fatty atrophy of the pancreas. Spleen: Normal in size without focal abnormality. Adrenals/Urinary Tract: Adrenal glands are unremarkable. Kidneys are normal, without renal calculi, focal lesion, or hydronephrosis. Bladder is unremarkable. Stomach/Bowel: Stomach is within normal limits. There is diffuse bowel wall thickening throughout the entire colon. No dilation to suggest obstruction. Small bowel is unremarkable. Vascular/Lymphatic: Aortic atherosclerosis. No enlarged abdominal or pelvic lymph nodes. Reproductive: Prostate is borderline enlarged. Other: No abdominal wall hernia or abnormality. No abdominopelvic ascites. Musculoskeletal: Stable lucent lesion in the right hemipelvis. Multilevel degenerative disc disease in the lumbar spine. No acute finding. IMPRESSION: 1. There is diffuse bowel wall thickening throughout the entire colon, consistent with colitis, which may be infectious or inflammatory in etiology. 2. There is new mild pericholecystic fat stranding which is nonspecific. No gallstones identified. Further evaluation with  right upper quadrant ultrasound may be considered. 3. Stable 3.0 cm cystic mass in the pancreatic head. 4. Hepatic steatosis. Multiple liver lesions, better characterized on recent MRI. Aortic Atherosclerosis (ICD10-I70.0). Electronically Signed   By: Audie Pinto M.D.   On: 03/27/2021 12:05    Procedures Procedures   Medications Ordered in ED Medications  vancomycin (VANCOCIN) capsule 125 mg (has no administration in time range)  sodium chloride 0.9 % bolus 1,000 mL ( Intravenous Stopped 03/09/2021 1302)   iohexol (OMNIPAQUE) 350 MG/ML injection 100 mL (100 mLs Intravenous Contrast Given 03/29/2021 1134)  loperamide (IMODIUM) capsule 4 mg (4 mg Oral Given 03/24/2021 1156)    ED Course  I have reviewed the triage vital signs and the nursing notes.  Pertinent labs & imaging results that were available during my care of the patient were reviewed by me and considered in my medical decision making (see chart for details).  Differential diagnosis includes reaction to chemo medications versus diverticulitis versus colitis versus bacterial infection versus other.  I personally reviewed the patient's prior medical records including his oncology outpatient visits.  I ordered and interpreted the patient's labs and imaging today, which was notable for leukopenia with white blood cell count of 2.1, C. difficile positive, signs of mild dehydration with increased Pacific gravity in the urine and ketones.  Creatinine normal at 0.8.  Electrolytes within normal limits.  CT abdomen pelvis showing diffuse colonic thickness consistent with colitis  Supplemental history provided by the patient's wife at bedside.  Clinical Course as of 03/27/2021 1439  Sat Mar 17, 2021  1301 C Diff antigen(!): POSITIVE [MT]  1301 C Diff toxin(!): POSITIVE [MT]  1314 Patient is wife are updated regarding his diagnosis.  I have ordered an oral vancomycin.  Given his clinical comorbidities and concern for dehydration, and the degree of inflammation on his CT scan, I think it is reasonable to start him on p.o. vancomycin and admit him to the hospital for observation overnight. [MT]  1315 Does not have focal right upper quadrant tenderness or persistent nausea to suspect acute biliary disease.  His liver enzymes are within normal limits.  I doubt acute cholecystitis [MT]    Clinical Course User Index [MT] Trifan, Carola Rhine, MD    Final Clinical Impression(s) / ED Diagnoses Final diagnoses:  C. difficile colitis    Rx / DC  Orders ED Discharge Orders     None        Wyvonnia Dusky, MD 03/18/2021 1439

## 2021-03-18 ENCOUNTER — Observation Stay (HOSPITAL_COMMUNITY): Payer: Medicare Other

## 2021-03-18 DIAGNOSIS — J449 Chronic obstructive pulmonary disease, unspecified: Secondary | ICD-10-CM | POA: Diagnosis not present

## 2021-03-18 DIAGNOSIS — I10 Essential (primary) hypertension: Secondary | ICD-10-CM | POA: Diagnosis present

## 2021-03-18 DIAGNOSIS — E871 Hypo-osmolality and hyponatremia: Secondary | ICD-10-CM | POA: Diagnosis present

## 2021-03-18 DIAGNOSIS — R569 Unspecified convulsions: Secondary | ICD-10-CM | POA: Diagnosis not present

## 2021-03-18 DIAGNOSIS — A4152 Sepsis due to Pseudomonas: Secondary | ICD-10-CM | POA: Diagnosis not present

## 2021-03-18 DIAGNOSIS — C787 Secondary malignant neoplasm of liver and intrahepatic bile duct: Secondary | ICD-10-CM | POA: Diagnosis present

## 2021-03-18 DIAGNOSIS — A0472 Enterocolitis due to Clostridium difficile, not specified as recurrent: Secondary | ICD-10-CM | POA: Diagnosis present

## 2021-03-18 DIAGNOSIS — A414 Sepsis due to anaerobes: Secondary | ICD-10-CM | POA: Diagnosis not present

## 2021-03-18 DIAGNOSIS — J9811 Atelectasis: Secondary | ICD-10-CM | POA: Diagnosis not present

## 2021-03-18 DIAGNOSIS — R5383 Other fatigue: Secondary | ICD-10-CM | POA: Diagnosis not present

## 2021-03-18 DIAGNOSIS — N179 Acute kidney failure, unspecified: Secondary | ICD-10-CM | POA: Diagnosis not present

## 2021-03-18 DIAGNOSIS — C7931 Secondary malignant neoplasm of brain: Secondary | ICD-10-CM

## 2021-03-18 DIAGNOSIS — J9690 Respiratory failure, unspecified, unspecified whether with hypoxia or hypercapnia: Secondary | ICD-10-CM | POA: Diagnosis not present

## 2021-03-18 DIAGNOSIS — E1165 Type 2 diabetes mellitus with hyperglycemia: Secondary | ICD-10-CM | POA: Diagnosis present

## 2021-03-18 DIAGNOSIS — E1169 Type 2 diabetes mellitus with other specified complication: Secondary | ICD-10-CM | POA: Diagnosis present

## 2021-03-18 DIAGNOSIS — D6959 Other secondary thrombocytopenia: Secondary | ICD-10-CM | POA: Diagnosis present

## 2021-03-18 DIAGNOSIS — J440 Chronic obstructive pulmonary disease with acute lower respiratory infection: Secondary | ICD-10-CM | POA: Diagnosis not present

## 2021-03-18 DIAGNOSIS — Y95 Nosocomial condition: Secondary | ICD-10-CM | POA: Diagnosis not present

## 2021-03-18 DIAGNOSIS — C159 Malignant neoplasm of esophagus, unspecified: Secondary | ICD-10-CM

## 2021-03-18 DIAGNOSIS — N170 Acute kidney failure with tubular necrosis: Secondary | ICD-10-CM | POA: Diagnosis not present

## 2021-03-18 DIAGNOSIS — R259 Unspecified abnormal involuntary movements: Secondary | ICD-10-CM | POA: Diagnosis not present

## 2021-03-18 DIAGNOSIS — R6521 Severe sepsis with septic shock: Secondary | ICD-10-CM | POA: Diagnosis not present

## 2021-03-18 DIAGNOSIS — Z20822 Contact with and (suspected) exposure to covid-19: Secondary | ICD-10-CM | POA: Diagnosis present

## 2021-03-18 DIAGNOSIS — K7200 Acute and subacute hepatic failure without coma: Secondary | ICD-10-CM | POA: Diagnosis not present

## 2021-03-18 DIAGNOSIS — E872 Acidosis, unspecified: Secondary | ICD-10-CM | POA: Diagnosis present

## 2021-03-18 DIAGNOSIS — D8481 Immunodeficiency due to conditions classified elsewhere: Secondary | ICD-10-CM | POA: Diagnosis present

## 2021-03-18 DIAGNOSIS — D709 Neutropenia, unspecified: Secondary | ICD-10-CM | POA: Diagnosis present

## 2021-03-18 DIAGNOSIS — R509 Fever, unspecified: Secondary | ICD-10-CM | POA: Diagnosis not present

## 2021-03-18 DIAGNOSIS — E1142 Type 2 diabetes mellitus with diabetic polyneuropathy: Secondary | ICD-10-CM | POA: Diagnosis present

## 2021-03-18 DIAGNOSIS — E669 Obesity, unspecified: Secondary | ICD-10-CM | POA: Diagnosis present

## 2021-03-18 DIAGNOSIS — R5081 Fever presenting with conditions classified elsewhere: Secondary | ICD-10-CM | POA: Diagnosis not present

## 2021-03-18 DIAGNOSIS — J189 Pneumonia, unspecified organism: Secondary | ICD-10-CM | POA: Diagnosis not present

## 2021-03-18 DIAGNOSIS — Z4682 Encounter for fitting and adjustment of non-vascular catheter: Secondary | ICD-10-CM | POA: Diagnosis not present

## 2021-03-18 DIAGNOSIS — R579 Shock, unspecified: Secondary | ICD-10-CM | POA: Diagnosis not present

## 2021-03-18 DIAGNOSIS — J9601 Acute respiratory failure with hypoxia: Secondary | ICD-10-CM | POA: Diagnosis not present

## 2021-03-18 DIAGNOSIS — R0602 Shortness of breath: Secondary | ICD-10-CM | POA: Diagnosis not present

## 2021-03-18 DIAGNOSIS — A419 Sepsis, unspecified organism: Secondary | ICD-10-CM | POA: Diagnosis not present

## 2021-03-18 DIAGNOSIS — D689 Coagulation defect, unspecified: Secondary | ICD-10-CM | POA: Diagnosis not present

## 2021-03-18 LAB — COMPREHENSIVE METABOLIC PANEL
ALT: 46 U/L — ABNORMAL HIGH (ref 0–44)
AST: 20 U/L (ref 15–41)
Albumin: 2.8 g/dL — ABNORMAL LOW (ref 3.5–5.0)
Alkaline Phosphatase: 50 U/L (ref 38–126)
Anion gap: 15 (ref 5–15)
BUN: 15 mg/dL (ref 8–23)
CO2: 16 mmol/L — ABNORMAL LOW (ref 22–32)
Calcium: 7.3 mg/dL — ABNORMAL LOW (ref 8.9–10.3)
Chloride: 104 mmol/L (ref 98–111)
Creatinine, Ser: 0.72 mg/dL (ref 0.61–1.24)
GFR, Estimated: >60 mL/min (ref >60)
Glucose, Bld: 223 mg/dL — ABNORMAL HIGH (ref 70–99)
Potassium: 4 mmol/L (ref 3.5–5.1)
Sodium: 135 mmol/L (ref 135–145)
Total Bilirubin: 2 mg/dL — ABNORMAL HIGH (ref 0.3–1.2)
Total Protein: 5.2 g/dL — ABNORMAL LOW (ref 6.5–8.1)

## 2021-03-18 LAB — CBC
HCT: 46.6 % (ref 39.0–52.0)
Hemoglobin: 15.6 g/dL (ref 13.0–17.0)
MCH: 31.6 pg (ref 26.0–34.0)
MCHC: 33.5 g/dL (ref 30.0–36.0)
MCV: 94.5 fL (ref 80.0–100.0)
Platelets: 112 10*3/uL — ABNORMAL LOW (ref 150–400)
RBC: 4.93 MIL/uL (ref 4.22–5.81)
RDW: 15.4 % (ref 11.5–15.5)
WBC: 0.6 10*3/uL — CL (ref 4.0–10.5)
nRBC: 0 % (ref 0.0–0.2)

## 2021-03-18 LAB — CBC WITH DIFFERENTIAL/PLATELET
Abs Immature Granulocytes: 0.01 10*3/uL (ref 0.00–0.07)
Basophils Absolute: 0 10*3/uL (ref 0.0–0.1)
Basophils Relative: 1 %
Eosinophils Absolute: 0 10*3/uL (ref 0.0–0.5)
Eosinophils Relative: 3 %
HCT: 46.4 % (ref 39.0–52.0)
Hemoglobin: 15.7 g/dL (ref 13.0–17.0)
Immature Granulocytes: 1 %
Lymphocytes Relative: 57 %
Lymphs Abs: 0.4 10*3/uL — ABNORMAL LOW (ref 0.7–4.0)
MCH: 31.9 pg (ref 26.0–34.0)
MCHC: 33.8 g/dL (ref 30.0–36.0)
MCV: 94.3 fL (ref 80.0–100.0)
Monocytes Absolute: 0 10*3/uL — ABNORMAL LOW (ref 0.1–1.0)
Monocytes Relative: 3 %
Neutro Abs: 0.3 10*3/uL — CL (ref 1.7–7.7)
Neutrophils Relative %: 35 %
Platelets: 113 10*3/uL — ABNORMAL LOW (ref 150–400)
RBC: 4.92 MIL/uL (ref 4.22–5.81)
RDW: 15.4 % (ref 11.5–15.5)
WBC: 0.8 10*3/uL — CL (ref 4.0–10.5)
nRBC: 0 % (ref 0.0–0.2)

## 2021-03-18 LAB — GLUCOSE, CAPILLARY
Glucose-Capillary: 248 mg/dL — ABNORMAL HIGH (ref 70–99)
Glucose-Capillary: 284 mg/dL — ABNORMAL HIGH (ref 70–99)
Glucose-Capillary: 254 mg/dL — ABNORMAL HIGH (ref 70–99)
Glucose-Capillary: 363 mg/dL — ABNORMAL HIGH (ref 70–99)

## 2021-03-18 MED ORDER — SODIUM BICARBONATE 8.4 % IV SOLN
INTRAVENOUS | Status: DC
  Filled 2021-03-18: qty 1000
  Filled 2021-03-18: qty 150
  Filled 2021-03-18 (×3): qty 1000

## 2021-03-18 MED ORDER — TBO-FILGRASTIM 480 MCG/0.8ML ~~LOC~~ SOSY
480.0000 ug | PREFILLED_SYRINGE | Freq: Every day | SUBCUTANEOUS | Status: DC
  Administered 2021-03-18 – 2021-03-19 (×2): 480 ug via SUBCUTANEOUS
  Filled 2021-03-18 (×3): qty 0.8

## 2021-03-18 MED ORDER — IPRATROPIUM-ALBUTEROL 0.5-2.5 (3) MG/3ML IN SOLN
3.0000 mL | Freq: Four times a day (QID) | RESPIRATORY_TRACT | Status: DC
  Administered 2021-03-18 – 2021-03-20 (×7): 3 mL via RESPIRATORY_TRACT
  Filled 2021-03-18 (×7): qty 3

## 2021-03-18 MED ORDER — INSULIN ASPART 100 UNIT/ML IJ SOLN
3.0000 [IU] | Freq: Once | INTRAMUSCULAR | Status: AC
  Administered 2021-03-18: 3 [IU] via SUBCUTANEOUS

## 2021-03-18 MED ORDER — MAGIC MOUTHWASH W/LIDOCAINE
10.0000 mL | Freq: Three times a day (TID) | ORAL | Status: DC | PRN
  Filled 2021-03-18: qty 10

## 2021-03-18 MED ORDER — CHLORHEXIDINE GLUCONATE CLOTH 2 % EX PADS
6.0000 | MEDICATED_PAD | Freq: Every day | CUTANEOUS | Status: DC
  Administered 2021-03-18 – 2021-03-19 (×2): 6 via TOPICAL

## 2021-03-18 MED ORDER — BUDESONIDE 0.25 MG/2ML IN SUSP
0.2500 mg | Freq: Two times a day (BID) | RESPIRATORY_TRACT | Status: DC
  Administered 2021-03-18 – 2021-03-19 (×2): 0.25 mg via RESPIRATORY_TRACT
  Filled 2021-03-18 (×2): qty 2

## 2021-03-18 NOTE — Progress Notes (Addendum)
Jason Snyder   DOB:[birth year]   IO#:[id number]   LAG#:[id number]  Oncology f/u   Subjective: Patient is well-known to me, under my care for his metastatic esophageal cancer.  He is currently on first-line chemotherapy, last dose on March 08, 2021.  He presented with severe diarrhea, fever, nausea, and fatigue.  He was admitted for C. difficile colitis yesterday.  Diarrhea has slightly improved today, he still febrile, very tired, also has mild sore.  His wife is at bedside.   Objective:  Vitals:   03/18/21 0744 03/18/21 1320  BP:  140/83  Pulse:  (!) 106  Resp:  18  Temp:  100.3 F (37.9 C)  SpO2: 93% 96%    Body mass index is 29.27 kg/m.  Intake/Output Summary (Last 24 hours) at 03/18/2021 1358 Last data filed at 03/18/2021 0917 Gross per 24 hour  Intake 120 ml  Output --  Net 120 ml     Sclerae unicteric  Oropharynx clear, no thrush   Neuro nonfocal    CBG (last 3)  Recent Labs    03/18/21 0819 03/18/21 1148  GLUCAP 248* 284*     Labs:  Urine Studies No results for input(s): UHGB, CRYS in the last 72 hours.  Invalid input(s): UACOL, UAPR, USPG, UPH, UTP, UGL, UKET, UBIL, UNIT, UROB, ULEU, UEPI, UWBC, URBC, UBAC, CAST, UCOM, BILUA  Basic Metabolic Panel: Recent Labs  Lab 03/15/2021 1046 03/18/21 0600  NA 136 135  K 4.0 4.0  CL 100 104  CO2 20* 16*  GLUCOSE 186* 223*  BUN 18 15  CREATININE 0.86 0.72  CALCIUM 7.9* 7.3*  MG 2.2  --    GFR Estimated Creatinine Clearance: 114.9 mL/min (by C-G formula based on SCr of 0.72 mg/dL). Liver Function Tests: Recent Labs  Lab 04/02/2021 1046 03/18/21 0600  AST 27 20  ALT 60* 46*  ALKPHOS 53 50  BILITOT 2.0* 2.0*  PROT 5.5* 5.2*  ALBUMIN 3.1* 2.8*   No results for input(s): LIPASE, AMYLASE in the last 168 hours. No results for input(s): AMMONIA in the last 168 hours. Coagulation profile No results for input(s): INR, PROTIME in the last 168 hours.  CBC: Recent Labs  Lab 03/10/2021 1046 03/18/21 0600  03/18/21 0731  WBC 2.1* 0.6* 0.8*  NEUTROABS 1.2*  --  0.3*  HGB 16.1 15.6 15.7  HCT 48.3 46.6 46.4  MCV 95.5 94.5 94.3  PLT 122* 112* 113*   Cardiac Enzymes: No results for input(s): CKTOTAL, CKMB, CKMBINDEX, TROPONINI in the last 168 hours. BNP: Invalid input(s): POCBNP CBG: Recent Labs  Lab 03/18/21 0819 03/18/21 1148  GLUCAP 248* 284*   D-Dimer No results for input(s): DDIMER in the last 72 hours. Hgb A1c Recent Labs    03/19/2021 1046  HGBA1C 9.9*   Lipid Profile No results for input(s): CHOL, HDL, LDLCALC, TRIG, CHOLHDL, LDLDIRECT in the last 72 hours. Thyroid function studies No results for input(s): TSH, T4TOTAL, T3FREE, THYROIDAB in the last 72 hours.  Invalid input(s): FREET3 Anemia work up No results for input(s): VITAMINB12, FOLATE, FERRITIN, TIBC, IRON, RETICCTPCT in the last 72 hours. Microbiology Recent Results (from the past 240 hour(s))  C Difficile Quick Screen w PCR reflex     Status: Abnormal   Collection Time: 03/30/2021 11:32 AM   Specimen: STOOL  Result Value Ref Range Status   C Diff antigen POSITIVE (A) NEGATIVE Final   C Diff toxin POSITIVE (A) NEGATIVE Final   C Diff interpretation Toxin producing C. difficile detected.  Final  Comment: CRITICAL RESULT CALLED TO, READ BACK BY AND VERIFIED WITH: CARRIE SIMPSON RN Performed at [facility] [address], [place], [place] [address]   Resp Panel by RT-PCR (Flu A&B, Covid) Nasopharyngeal Swab     Status: None   Collection Time: 03/26/2021  1:42 PM   Specimen: Nasopharyngeal Swab; Nasopharyngeal(NP) swabs in vial transport medium  Result Value Ref Range Status   SARS Coronavirus 2 by RT PCR NEGATIVE NEGATIVE Final    Comment: (NOTE) SARS-CoV-2 target nucleic acids are NOT DETECTED.  The SARS-CoV-2 RNA is generally detectable in upper respiratory specimens during the acute phase of infection. The lowest concentration of SARS-CoV-2 viral copies this assay can detect  is 138 copies/mL. A negative result does not preclude SARS-Cov-2 infection and should not be used as the sole basis for treatment or other patient management decisions. A negative result may occur with  improper specimen collection/handling, submission of specimen other than nasopharyngeal swab, presence of viral mutation(s) within the areas targeted by this assay, and inadequate number of viral copies(<138 copies/mL). A negative result must be combined with clinical observations, patient history, and epidemiological information. The expected result is Negative.  Fact Sheet for Patients:  EntrepreneurPulse.com.au  Fact Sheet for Healthcare Providers:  IncredibleEmployment.be  This test is no t yet approved or cleared by the [place] FDA and  has been authorized for detection and/or diagnosis of SARS-CoV-2 by FDA under an Emergency Use Authorization (EUA). This EUA will remain  in effect (meaning this test can be used) for the duration of the COVID-19 declaration under Section 564(b)(1) of the Act, 21 U.S.C.section 360bbb-3(b)(1), unless the authorization is terminated  or revoked sooner.       Influenza A by PCR NEGATIVE NEGATIVE Final   Influenza B by PCR NEGATIVE NEGATIVE Final    Comment: (NOTE) The Xpert Xpress SARS-CoV-2/FLU/RSV plus assay is intended as an aid in the diagnosis of influenza from Nasopharyngeal swab specimens and should not be used as a sole basis for treatment. Nasal washings and aspirates are unacceptable for Xpert Xpress SARS-CoV-2/FLU/RSV testing.  Fact Sheet for Patients: EntrepreneurPulse.com.au  Fact Sheet for Healthcare Providers: IncredibleEmployment.be  This test is not yet approved or cleared by the [place] FDA and has been authorized for detection and/or diagnosis of SARS-CoV-2 by FDA under an Emergency Use Authorization (EUA). This EUA will remain in effect  (meaning this test can be used) for the duration of the COVID-19 declaration under Section 564(b)(1) of the Act, 21 U.S.C. section 360bbb-3(b)(1), unless the authorization is terminated or revoked.  Performed at [facility], [place] [address], [place], [place] [address]       Studies:  CT Abdomen Pelvis W Contrast  Result Date: 03/11/2021 CLINICAL DATA:  Metastatic esophageal cancer. Left lower quadrant pain and nausea. EXAM: CT ABDOMEN AND PELVIS WITH CONTRAST TECHNIQUE: Multidetector CT imaging of the abdomen and pelvis was performed using the standard protocol following bolus administration of intravenous contrast. CONTRAST:  165mL OMNIPAQUE IOHEXOL 350 MG/ML SOLN COMPARISON:  CT abdomen pelvis 01/22/2021 FINDINGS: Lower chest: Tiny anterior pericardial effusion, similar to prior chest CT. Hepatobiliary: Small low-attenuation lesion again noted in the patent dome. There is also a rim enhancing mass adjacent to the gallbladder fossa in the anterior liver, with surrounding increased sclerosis. There is no new focal liver lesion identified. Hepatic steatosis. There is new mild pericholecystic fat stranding. Which is nonspecific. No gallstones identified. The Pancreas: Stable 3.0 cm cystic mass in the pancreatic  head. Diffuse fatty atrophy of the pancreas. Spleen: Normal in size without focal abnormality. Adrenals/Urinary Tract: Adrenal glands are unremarkable. Kidneys are normal, without renal calculi, focal lesion, or hydronephrosis. Bladder is unremarkable. Stomach/Bowel: Stomach is within normal limits. There is diffuse bowel wall thickening throughout the entire colon. No dilation to suggest obstruction. Small bowel is unremarkable. Vascular/Lymphatic: Aortic atherosclerosis. No enlarged abdominal or pelvic lymph nodes. Reproductive: Prostate is borderline enlarged. Other: No abdominal wall hernia or abnormality. No abdominopelvic ascites. Musculoskeletal: Stable lucent lesion in  the right hemipelvis. Multilevel degenerative disc disease in the lumbar spine. No acute finding. IMPRESSION: 1. There is diffuse bowel wall thickening throughout the entire colon, consistent with colitis, which may be infectious or inflammatory in etiology. 2. There is new mild pericholecystic fat stranding which is nonspecific. No gallstones identified. Further evaluation with right upper quadrant ultrasound may be considered. 3. Stable 3.0 cm cystic mass in the pancreatic head. 4. Hepatic steatosis. Multiple liver lesions, better characterized on recent MRI. Aortic Atherosclerosis (ICD10-I70.0). Electronically Signed   By: Audie Pinto M.D.   On: 03/27/2021 12:05    Assessment: 70 y.o. 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 difficile colitis, I we will start him on Granix 480 mcg daily until [place] above 1.5K.  I spoke with pharmacy to place the order -Magic mouthwash for mucositis -Continue supportive care -Hold on chemotherapy Xeloda for now, until he recovers well.  We will likely postpone his next cycle chemo -will f/u as needed. Please call us if needed.    Truitt Merle, MD 03/18/2021  1:58 PM

## 2021-03-18 NOTE — Progress Notes (Signed)
PROGRESS NOTE    Jason Snyder  QQP:[id number] DOB: 04/13/1951 DOA: 03/31/2021 PCP: [name], MD    Brief Narrative:  70 year old male with history of esophageal adenocarcinoma with mets to liver and brain, presented with diarrhea.  He was found to have C. difficile colitis.  He was admitted for further management and started on oral vancomycin.  He is noted to have neutropenic fever and was started on G-CSF.  Oncology following.  Last chemo was on 10/6.   Assessment & Plan:   Active Problems:   C. difficile colitis   Neutropenic fever (HCC)   C. difficile colitis -Currently on oral vancomycin -Continue to monitor frequency of stools -Encouraged p.o. intake -He is having nausea and vomiting -Continue antiemetics, Magic mouthwash for stomatitis.  Neutropenic fever -Patient has been getting chemotherapy -Appreciate oncology input -Started on G-CSF  Thrombocytopenia -Likely related to chemo -Continue to follow and treat supportively  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 -Followed by oncology/neurooncology -Has been receiving chemotherapy/radiation -Currently on dexamethasone taper for brain mets -Has chronic right leg weakness related to metastases.  Metabolic acidosis -Suspect is related to increased stool output in the setting of CHF -Start on bicarbonate infusion  COPD -No wheezing at this time, continue on bronchodilators -He does report shortness of breath, will check chest x-ray -Shortness of breath may be related to respiratory compensation for metabolic acidosis  Hypertension -Continued on home dose of amlodipine, metoprolol  Hyperlipidemia -Continue on statin  BPH -Continued on tamsulosin and finasteride  DVT prophylaxis: SCDs Start: 03/14/2021 1635  Code Status: Full code Family Communication: No family present Disposition Plan: Status is: Inpatient  Remains inpatient appropriate because: Inability to take p.o., needs  continued treatment for C. difficile colitis    Consultants:  Oncology  Procedures:    Antimicrobials:  Oral vancomycin 10/15 >   Subjective: Says he feels more short of breath today.  He has had 2 loose bowel movements today.  P.o. intake remains poor and reports that he vomits every time he tries to eat.  Objective: Vitals:   03/30/2021 2259 03/18/21 0249 03/18/21 0744 03/18/21 1320  BP: 138/89 127/83  140/83  Pulse: 93 91  (!) 106  Resp: 18 18  18   Temp: 98.2 F (36.8 C) 98.4 F (36.9 C)  100.3 F (37.9 C)  TempSrc: Oral Oral  Oral  SpO2: 92% 96% 93% 96%  Weight:      Height:        Intake/Output Summary (Last 24 hours) at 03/18/2021 1741 Last data filed at 03/18/2021 0917 Gross per 24 hour  Intake 120 ml  Output --  Net 120 ml   Filed Weights   03/19/2021 1545  Weight: 103.4 kg    Examination:  General exam: Appears calm and comfortable  Respiratory system: Clear to auscultation. Respiratory effort normal. Cardiovascular system: S1 & S2 heard, RRR. No JVD, murmurs, rubs, gallops or clicks. No pedal edema. Gastrointestinal system: Abdomen is nondistended, soft and nontender. No organomegaly or masses felt. Normal bowel sounds heard. Central nervous system: Alert and oriented. No focal neurological deficits. Extremities: Symmetric 5 x 5 power. Skin: No rashes, lesions or ulcers Psychiatry: Judgement and insight appear normal. Mood & affect appropriate.     Data Reviewed: I have personally reviewed following labs and imaging studies  CBC: Recent Labs  Lab 03/30/2021 1046 03/18/21 0600 03/18/21 0731  WBC 2.1* 0.6* 0.8*  NEUTROABS 1.2*  --  0.3*  HGB 16.1 15.6 15.7  HCT  48.3 46.6 46.4  MCV 95.5 94.5 94.3  PLT 122* 112* 562*   Basic Metabolic Panel: Recent Labs  Lab 03/29/2021 1046 03/18/21 0600  NA 136 135  K 4.0 4.0  CL 100 104  CO2 20* 16*  GLUCOSE 186* 223*  BUN 18 15  CREATININE 0.86 0.72  CALCIUM 7.9* 7.3*  MG 2.2  --     GFR: Estimated Creatinine Clearance: 114.9 mL/min (by C-G formula based on SCr of 0.72 mg/dL). Liver Function Tests: Recent Labs  Lab 03/26/2021 1046 03/18/21 0600  AST 27 20  ALT 60* 46*  ALKPHOS 53 50  BILITOT 2.0* 2.0*  PROT 5.5* 5.2*  ALBUMIN 3.1* 2.8*   No results for input(s): LIPASE, AMYLASE in the last 168 hours. No results for input(s): AMMONIA in the last 168 hours. Coagulation Profile: No results for input(s): INR, PROTIME in the last 168 hours. Cardiac Enzymes: No results for input(s): CKTOTAL, CKMB, CKMBINDEX, TROPONINI in the last 168 hours. BNP (last 3 results) No results for input(s): PROBNP in the last 8760 hours. HbA1C: Recent Labs    03/28/2021 1046  HGBA1C 9.9*   CBG: Recent Labs  Lab 03/18/21 0819 03/18/21 1148 03/18/21 1628  GLUCAP 248* 284* 254*   Lipid Profile: No results for input(s): CHOL, HDL, LDLCALC, TRIG, CHOLHDL, LDLDIRECT in the last 72 hours. Thyroid Function Tests: No results for input(s): TSH, T4TOTAL, FREET4, T3FREE, THYROIDAB in the last 72 hours. Anemia Panel: No results for input(s): VITAMINB12, FOLATE, FERRITIN, TIBC, IRON, RETICCTPCT in the last 72 hours. Sepsis Labs: No results for input(s): PROCALCITON, LATICACIDVEN in the last 168 hours.  Recent Results (from the past 240 hour(s))  C Difficile Quick Screen w PCR reflex     Status: Abnormal   Collection Time: 03/21/2021 11:32 AM   Specimen: STOOL  Result Value Ref Range Status   C Diff antigen POSITIVE (A) NEGATIVE Final   C Diff toxin POSITIVE (A) NEGATIVE Final   C Diff interpretation Toxin producing C. difficile detected.  Final    Comment: CRITICAL RESULT CALLED TO, READ BACK BY AND VERIFIED WITH: CARRIE SIMPSON RN Performed at [facility] [address] [place], [place] [address]   Resp Panel by RT-PCR (Flu A&B, Covid) Nasopharyngeal Swab     Status: None   Collection Time: 03/22/2021  1:42 PM   Specimen: Nasopharyngeal Swab;  Nasopharyngeal(NP) swabs in vial transport medium  Result Value Ref Range Status   SARS Coronavirus 2 by RT PCR NEGATIVE NEGATIVE Final    Comment: (NOTE) SARS-CoV-2 target nucleic acids are NOT DETECTED.  The SARS-CoV-2 RNA is generally detectable in upper respiratory specimens during the acute phase of infection. The lowest concentration of SARS-CoV-2 viral copies this assay can detect is 138 copies/mL. A negative result does not preclude SARS-Cov-2 infection and should not be used as the sole basis for treatment or other patient management decisions. A negative result may occur with  improper specimen collection/handling, submission of specimen other than nasopharyngeal swab, presence of viral mutation(s) within the areas targeted by this assay, and inadequate number of viral copies(<138 copies/mL). A negative result must be combined with clinical observations, patient history, and epidemiological information. The expected result is Negative.  Fact Sheet for Patients:  EntrepreneurPulse.com.au  Fact Sheet for Healthcare Providers:  IncredibleEmployment.be  This test is no t yet approved or cleared by the [place] FDA and  has been authorized for detection and/or diagnosis of SARS-CoV-2 by FDA under an Emergency Use Authorization (EUA).  This EUA will remain  in effect (meaning this test can be used) for the duration of the COVID-19 declaration under Section 564(b)(1) of the Act, 21 U.S.C.section 360bbb-3(b)(1), unless the authorization is terminated  or revoked sooner.       Influenza A by PCR NEGATIVE NEGATIVE Final   Influenza B by PCR NEGATIVE NEGATIVE Final    Comment: (NOTE) The Xpert Xpress SARS-CoV-2/FLU/RSV plus assay is intended as an aid in the diagnosis of influenza from Nasopharyngeal swab specimens and should not be used as a sole basis for treatment. Nasal washings and aspirates are unacceptable for Xpert Xpress  SARS-CoV-2/FLU/RSV testing.  Fact Sheet for Patients: EntrepreneurPulse.com.au  Fact Sheet for Healthcare Providers: IncredibleEmployment.be  This test is not yet approved or cleared by the [place] FDA and has been authorized for detection and/or diagnosis of SARS-CoV-2 by FDA under an Emergency Use Authorization (EUA). This EUA will remain in effect (meaning this test can be used) for the duration of the COVID-19 declaration under Section 564(b)(1) of the Act, 21 U.S.C. section 360bbb-3(b)(1), unless the authorization is terminated or revoked.  Performed at [facility], [place] [address] [place], [place] [address]          Radiology Studies: CT Abdomen Pelvis W Contrast  Result Date: 03/08/2021 CLINICAL DATA:  Metastatic esophageal cancer. Left lower quadrant pain and nausea. EXAM: CT ABDOMEN AND PELVIS WITH CONTRAST TECHNIQUE: Multidetector CT imaging of the abdomen and pelvis was performed using the standard protocol following bolus administration of intravenous contrast. CONTRAST:  154mL OMNIPAQUE IOHEXOL 350 MG/ML SOLN COMPARISON:  CT abdomen pelvis 01/22/2021 FINDINGS: Lower chest: Tiny anterior pericardial effusion, similar to prior chest CT. Hepatobiliary: Small low-attenuation lesion again noted in the patent dome. There is also a rim enhancing mass adjacent to the gallbladder fossa in the anterior liver, with surrounding increased sclerosis. There is no new focal liver lesion identified. Hepatic steatosis. There is new mild pericholecystic fat stranding. Which is nonspecific. No gallstones identified. The Pancreas: Stable 3.0 cm cystic mass in the pancreatic head. Diffuse fatty atrophy of the pancreas. Spleen: Normal in size without focal abnormality. Adrenals/Urinary Tract: Adrenal glands are unremarkable. Kidneys are normal, without renal calculi, focal lesion, or hydronephrosis. Bladder is unremarkable.  Stomach/Bowel: Stomach is within normal limits. There is diffuse bowel wall thickening throughout the entire colon. No dilation to suggest obstruction. Small bowel is unremarkable. Vascular/Lymphatic: Aortic atherosclerosis. No enlarged abdominal or pelvic lymph nodes. Reproductive: Prostate is borderline enlarged. Other: No abdominal wall hernia or abnormality. No abdominopelvic ascites. Musculoskeletal: Stable lucent lesion in the right hemipelvis. Multilevel degenerative disc disease in the lumbar spine. No acute finding. IMPRESSION: 1. There is diffuse bowel wall thickening throughout the entire colon, consistent with colitis, which may be infectious or inflammatory in etiology. 2. There is new mild pericholecystic fat stranding which is nonspecific. No gallstones identified. Further evaluation with right upper quadrant ultrasound may be considered. 3. Stable 3.0 cm cystic mass in the pancreatic head. 4. Hepatic steatosis. Multiple liver lesions, better characterized on recent MRI. Aortic Atherosclerosis (ICD10-I70.0). Electronically Signed   By: Audie Pinto M.D.   On: 03/18/2021 12:05        Scheduled Meds:  acidophilus  2 capsule Oral BID   amLODipine  10 mg Oral Daily   arformoterol  15 mcg Nebulization BID   And   umeclidinium bromide  1 puff Inhalation Daily   aspirin EC  81 mg Oral Daily   atorvastatin  20 mg Oral  QHS   budesonide (PULMICORT) nebulizer solution  0.25 mg Nebulization BID   Chlorhexidine Gluconate Cloth  6 each Topical Daily   dexamethasone  2 mg Oral Daily   Followed by   Derrill Memo ON 03/24/2021] dexamethasone  1 mg Oral Daily   Followed by   Derrill Memo ON 03/31/2021] dexamethasone  0.5 mg Oral Daily   finasteride  5 mg Oral Daily   gabapentin  300 mg Oral TID   insulin aspart  0-6 Units Subcutaneous TID WC   ipratropium-albuterol  3 mL Nebulization Q6H   metoprolol tartrate  50 mg Oral BID   multivitamin with minerals  1 tablet Oral Daily   tamsulosin  0.4 mg Oral  Daily   Tbo-filgastrim (GRANIX) SQ  480 mcg Subcutaneous q1800   vancomycin  125 mg Oral QID   Continuous Infusions:  sodium bicarbonate 150 mEq in D5W infusion 75 mL/hr at 03/18/21 1647     LOS: 0 days    Time spent: 35 mins    Kathie Dike, MD [facility]   If 7PM-7AM, please contact night-coverage [email or web address]  03/18/2021, 5:41 PM

## 2021-03-19 ENCOUNTER — Other Ambulatory Visit (HOSPITAL_COMMUNITY): Payer: Self-pay

## 2021-03-19 ENCOUNTER — Other Ambulatory Visit: Payer: Self-pay

## 2021-03-19 ENCOUNTER — Telehealth: Payer: Self-pay

## 2021-03-19 ENCOUNTER — Inpatient Hospital Stay (HOSPITAL_COMMUNITY): Payer: Medicare Other

## 2021-03-19 ENCOUNTER — Encounter (HOSPITAL_COMMUNITY): Payer: Self-pay | Admitting: Internal Medicine

## 2021-03-19 DIAGNOSIS — A419 Sepsis, unspecified organism: Secondary | ICD-10-CM | POA: Diagnosis not present

## 2021-03-19 DIAGNOSIS — R579 Shock, unspecified: Secondary | ICD-10-CM

## 2021-03-19 DIAGNOSIS — R6521 Severe sepsis with septic shock: Secondary | ICD-10-CM

## 2021-03-19 DIAGNOSIS — A0472 Enterocolitis due to Clostridium difficile, not specified as recurrent: Principal | ICD-10-CM

## 2021-03-19 DIAGNOSIS — J9601 Acute respiratory failure with hypoxia: Secondary | ICD-10-CM | POA: Diagnosis not present

## 2021-03-19 DIAGNOSIS — J9690 Respiratory failure, unspecified, unspecified whether with hypoxia or hypercapnia: Secondary | ICD-10-CM

## 2021-03-19 DIAGNOSIS — D709 Neutropenia, unspecified: Secondary | ICD-10-CM | POA: Diagnosis not present

## 2021-03-19 DIAGNOSIS — N179 Acute kidney failure, unspecified: Secondary | ICD-10-CM | POA: Diagnosis present

## 2021-03-19 DIAGNOSIS — R5081 Fever presenting with conditions classified elsewhere: Secondary | ICD-10-CM | POA: Diagnosis not present

## 2021-03-19 LAB — GLUCOSE, CAPILLARY
Glucose-Capillary: 449 mg/dL — ABNORMAL HIGH (ref 70–99)
Glucose-Capillary: 310 mg/dL — ABNORMAL HIGH (ref 70–99)
Glucose-Capillary: 270 mg/dL — ABNORMAL HIGH (ref 70–99)
Glucose-Capillary: 298 mg/dL — ABNORMAL HIGH (ref 70–99)
Glucose-Capillary: 174 mg/dL — ABNORMAL HIGH (ref 70–99)
Glucose-Capillary: 132 mg/dL — ABNORMAL HIGH (ref 70–99)

## 2021-03-19 LAB — COMPREHENSIVE METABOLIC PANEL
ALT: 85 U/L — ABNORMAL HIGH (ref 0–44)
ALT: 107 U/L — ABNORMAL HIGH (ref 0–44)
AST: 77 U/L — ABNORMAL HIGH (ref 15–41)
AST: 133 U/L — ABNORMAL HIGH (ref 15–41)
Albumin: 2.2 g/dL — ABNORMAL LOW (ref 3.5–5.0)
Albumin: 2 g/dL — ABNORMAL LOW (ref 3.5–5.0)
Alkaline Phosphatase: 46 U/L (ref 38–126)
Alkaline Phosphatase: 47 U/L (ref 38–126)
Anion gap: 11 (ref 5–15)
Anion gap: 14 (ref 5–15)
BUN: 27 mg/dL — ABNORMAL HIGH (ref 8–23)
BUN: 26 mg/dL — ABNORMAL HIGH (ref 8–23)
CO2: 21 mmol/L — ABNORMAL LOW (ref 22–32)
CO2: 22 mmol/L (ref 22–32)
Calcium: 7.3 mg/dL — ABNORMAL LOW (ref 8.9–10.3)
Calcium: 7.4 mg/dL — ABNORMAL LOW (ref 8.9–10.3)
Chloride: 101 mmol/L (ref 98–111)
Chloride: 100 mmol/L (ref 98–111)
Creatinine, Ser: 2.46 mg/dL — ABNORMAL HIGH (ref 0.61–1.24)
Creatinine, Ser: 3.61 mg/dL — ABNORMAL HIGH (ref 0.61–1.24)
GFR, Estimated: 28 mL/min — ABNORMAL LOW (ref >60)
GFR, Estimated: 18 mL/min — ABNORMAL LOW (ref >60)
Glucose, Bld: 410 mg/dL — ABNORMAL HIGH (ref 70–99)
Glucose, Bld: 316 mg/dL — ABNORMAL HIGH (ref 70–99)
Potassium: 4.5 mmol/L (ref 3.5–5.1)
Potassium: 4.3 mmol/L (ref 3.5–5.1)
Sodium: 133 mmol/L — ABNORMAL LOW (ref 135–145)
Sodium: 136 mmol/L (ref 135–145)
Total Bilirubin: 2.4 mg/dL — ABNORMAL HIGH (ref 0.3–1.2)
Total Bilirubin: 1.6 mg/dL — ABNORMAL HIGH (ref 0.3–1.2)
Total Protein: 4.4 g/dL — ABNORMAL LOW (ref 6.5–8.1)
Total Protein: 4.3 g/dL — ABNORMAL LOW (ref 6.5–8.1)

## 2021-03-19 LAB — CBC WITH DIFFERENTIAL/PLATELET
Abs Immature Granulocytes: 0.04 10*3/uL (ref 0.00–0.07)
Basophils Absolute: 0 10*3/uL (ref 0.0–0.1)
Basophils Relative: 0 %
Eosinophils Absolute: 0 10*3/uL (ref 0.0–0.5)
Eosinophils Relative: 0 %
HCT: 47.3 % (ref 39.0–52.0)
Hemoglobin: 16.4 g/dL (ref 13.0–17.0)
Immature Granulocytes: 7 %
Lymphocytes Relative: 49 %
Lymphs Abs: 0.3 10*3/uL — ABNORMAL LOW (ref 0.7–4.0)
MCH: 32.2 pg (ref 26.0–34.0)
MCHC: 34.7 g/dL (ref 30.0–36.0)
MCV: 92.7 fL (ref 80.0–100.0)
Monocytes Absolute: 0 10*3/uL — ABNORMAL LOW (ref 0.1–1.0)
Monocytes Relative: 2 %
Neutro Abs: 0.2 10*3/uL — CL (ref 1.7–7.7)
Neutrophils Relative %: 42 %
Platelets: 100 10*3/uL — ABNORMAL LOW (ref 150–400)
RBC: 5.1 MIL/uL (ref 4.22–5.81)
RDW: 15.5 % (ref 11.5–15.5)
WBC: 0.6 10*3/uL — CL (ref 4.0–10.5)
nRBC: 5.3 % — ABNORMAL HIGH (ref 0.0–0.2)

## 2021-03-19 LAB — BLOOD GAS, ARTERIAL
Acid-base deficit: 5.1 mmol/L — ABNORMAL HIGH (ref 0.0–2.0)
Acid-base deficit: 6.9 mmol/L — ABNORMAL HIGH (ref 0.0–2.0)
Bicarbonate: 15.6 mmol/L — ABNORMAL LOW (ref 20.0–28.0)
Bicarbonate: 20.3 mmol/L (ref 20.0–28.0)
Drawn by: 560031
FIO2: 100
MECHVT: 540 mL
O2 Saturation: 94.3 %
O2 Saturation: 97.8 %
PEEP: 5 cmH2O
Patient temperature: 98.6
Patient temperature: 98.6
RATE: 16 resp/min
pCO2 arterial: 21.2 mmHg — ABNORMAL LOW (ref 32.0–48.0)
pCO2 arterial: 47.1 mmHg (ref 32.0–48.0)
pH, Arterial: 7.48 — ABNORMAL HIGH (ref 7.350–7.450)
pH, Arterial: 7.258 — ABNORMAL LOW (ref 7.350–7.450)
pO2, Arterial: 70.9 mmHg — ABNORMAL LOW (ref 83.0–108.0)
pO2, Arterial: 166 mmHg — ABNORMAL HIGH (ref 83.0–108.0)

## 2021-03-19 LAB — CREATININE, URINE, RANDOM: Creatinine, Urine: 144.74 mg/dL

## 2021-03-19 LAB — TROPONIN I (HIGH SENSITIVITY)
Troponin I (High Sensitivity): 668 ng/L (ref <18)
Troponin I (High Sensitivity): 909 ng/L (ref <18)

## 2021-03-19 LAB — PROTIME-INR
INR: 2.4 — ABNORMAL HIGH (ref 0.8–1.2)
Prothrombin Time: 25.8 seconds — ABNORMAL HIGH (ref 11.4–15.2)

## 2021-03-19 LAB — LACTIC ACID, PLASMA: Lactic Acid, Venous: 5.9 mmol/L (ref 0.5–1.9)

## 2021-03-19 LAB — SODIUM, URINE, RANDOM: Sodium, Ur: 53 mmol/L

## 2021-03-19 LAB — APTT: aPTT: 38 seconds — ABNORMAL HIGH (ref 24–36)

## 2021-03-19 LAB — MRSA NEXT GEN BY PCR, NASAL: MRSA by PCR Next Gen: NOT DETECTED

## 2021-03-19 MED ORDER — INSULIN ASPART 100 UNIT/ML IJ SOLN
0.0000 [IU] | INTRAMUSCULAR | Status: DC
  Administered 2021-03-19 (×2): 11 [IU] via SUBCUTANEOUS
  Administered 2021-03-19: 3 [IU] via SUBCUTANEOUS
  Administered 2021-03-19: 4 [IU] via SUBCUTANEOUS

## 2021-03-19 MED ORDER — VANCOMYCIN HCL 250 MG PO CAPS: 500.0000 mg | ORAL_CAPSULE | Freq: Four times a day (QID) | ORAL | Status: DC

## 2021-03-19 MED ORDER — METRONIDAZOLE 500 MG/100ML IV SOLN
500.0000 mg | Freq: Three times a day (TID) | INTRAVENOUS | Status: DC
  Administered 2021-03-19 – 2021-03-20 (×3): 500 mg via INTRAVENOUS
  Filled 2021-03-19 (×3): qty 100

## 2021-03-19 MED ORDER — MIDAZOLAM HCL 2 MG/2ML IJ SOLN
INTRAMUSCULAR | Status: AC
  Administered 2021-03-19: 2 mg via INTRAVENOUS
  Filled 2021-03-19: qty 2

## 2021-03-19 MED ORDER — PHENYLEPHRINE CONCENTRATED 100MG/250ML (0.4 MG/ML) INFUSION SIMPLE
0.0000 ug/min | INTRAVENOUS | Status: DC
  Administered 2021-03-19: 20 ug/min via INTRAVENOUS
  Administered 2021-03-20 (×2): 400 ug/min via INTRAVENOUS
  Filled 2021-03-19 (×4): qty 250

## 2021-03-19 MED ORDER — INSULIN ASPART 100 UNIT/ML IJ SOLN
15.0000 [IU] | Freq: Once | INTRAMUSCULAR | Status: AC
  Administered 2021-03-19: 15 [IU] via SUBCUTANEOUS

## 2021-03-19 MED ORDER — PROPOFOL 1000 MG/100ML IV EMUL
0.0000 ug/kg/min | INTRAVENOUS | Status: DC
  Administered 2021-03-19: 5 ug/kg/min via INTRAVENOUS
  Filled 2021-03-19: qty 100

## 2021-03-19 MED ORDER — LORATADINE 10 MG PO TABS: 10.0000 mg | ORAL_TABLET | Freq: Every day | ORAL | Status: DC | PRN

## 2021-03-19 MED ORDER — DOCUSATE SODIUM 50 MG/5ML PO LIQD
100.0000 mg | Freq: Two times a day (BID) | ORAL | Status: DC
  Filled 2021-03-19: qty 10

## 2021-03-19 MED ORDER — PHENYLEPHRINE 40 MCG/ML (10ML) SYRINGE FOR IV PUSH (FOR BLOOD PRESSURE SUPPORT)
PREFILLED_SYRINGE | INTRAVENOUS | Status: AC
  Administered 2021-03-19: 400 ug via INTRAVENOUS
  Filled 2021-03-19: qty 10

## 2021-03-19 MED ORDER — INSULIN GLARGINE-YFGN 100 UNIT/ML ~~LOC~~ SOLN
15.0000 [IU] | Freq: Every day | SUBCUTANEOUS | Status: DC
  Administered 2021-03-19 – 2021-03-20 (×2): 15 [IU] via SUBCUTANEOUS
  Filled 2021-03-19 (×2): qty 0.15

## 2021-03-19 MED ORDER — ROCURONIUM BROMIDE 10 MG/ML (PF) SYRINGE
PREFILLED_SYRINGE | INTRAVENOUS | Status: AC
  Administered 2021-03-19: 100 mg via INTRAVENOUS
  Filled 2021-03-19: qty 10

## 2021-03-19 MED ORDER — HYDROCORTISONE SOD SUC (PF) 100 MG IJ SOLR
100.0000 mg | Freq: Two times a day (BID) | INTRAMUSCULAR | Status: DC
  Administered 2021-03-19 – 2021-03-20 (×2): 100 mg via INTRAVENOUS
  Filled 2021-03-19 (×2): qty 2

## 2021-03-19 MED ORDER — HYDROCORTISONE SOD SUC (PF) 100 MG IJ SOLR: 50.0000 mg | Freq: Four times a day (QID) | INTRAMUSCULAR | Status: DC

## 2021-03-19 MED ORDER — LORAZEPAM 2 MG/ML IJ SOLN: 2.0000 mg | INTRAMUSCULAR | Status: DC | PRN

## 2021-03-19 MED ORDER — ASPIRIN 81 MG PO CHEW
81.0000 mg | CHEWABLE_TABLET | Freq: Every day | ORAL | Status: DC
  Administered 2021-03-20: 81 mg
  Filled 2021-03-19: qty 1

## 2021-03-19 MED ORDER — POLYETHYLENE GLYCOL 3350 17 G PO PACK: 17.0000 g | PACK | Freq: Every day | ORAL | Status: DC

## 2021-03-19 MED ORDER — SODIUM CHLORIDE 0.9 % IV SOLN
250.0000 mL | INTRAVENOUS | Status: DC
Start: 2021-03-19 — End: 2021-03-20

## 2021-03-19 MED ORDER — MIDAZOLAM HCL (PF) 5 MG/ML IJ SOLN: 2.0000 mg | Freq: Once | INTRAMUSCULAR | Status: DC

## 2021-03-19 MED ORDER — FENTANYL CITRATE (PF) 100 MCG/2ML IJ SOLN: 50.0000 ug | Freq: Once | INTRAMUSCULAR | Status: AC

## 2021-03-19 MED ORDER — ROCURONIUM BROMIDE 50 MG/5ML IV SOLN
100.0000 mg | Freq: Once | INTRAVENOUS | Status: AC
  Filled 2021-03-19: qty 10

## 2021-03-19 MED ORDER — FENTANYL BOLUS VIA INFUSION
25.0000 ug | INTRAVENOUS | Status: DC | PRN
  Administered 2021-03-19: 25 ug via INTRAVENOUS
  Filled 2021-03-19: qty 100

## 2021-03-19 MED ORDER — MIDAZOLAM HCL 2 MG/2ML IJ SOLN: 2.0000 mg | Freq: Once | INTRAMUSCULAR | Status: AC

## 2021-03-19 MED ORDER — FENTANYL CITRATE (PF) 100 MCG/2ML IJ SOLN: 25.0000 ug | INTRAMUSCULAR | Status: DC | PRN

## 2021-03-19 MED ORDER — INSULIN ASPART 100 UNIT/ML IJ SOLN: 0.0000 [IU] | Freq: Every day | INTRAMUSCULAR | Status: DC

## 2021-03-19 MED ORDER — SODIUM CHLORIDE 0.9 % IV SOLN
2.0000 g | Freq: Two times a day (BID) | INTRAVENOUS | Status: DC
  Administered 2021-03-19: 2 g via INTRAVENOUS
  Filled 2021-03-19: qty 2

## 2021-03-19 MED ORDER — MIDAZOLAM HCL 2 MG/2ML IJ SOLN
1.0000 mg | INTRAMUSCULAR | Status: DC | PRN
Start: 2021-03-19 — End: 2021-03-20

## 2021-03-19 MED ORDER — NOREPINEPHRINE 4 MG/250ML-% IV SOLN
2.0000 ug/min | INTRAVENOUS | Status: DC
  Filled 2021-03-19: qty 250

## 2021-03-19 MED ORDER — LACTATED RINGERS IV BOLUS
1000.0000 mL | Freq: Once | INTRAVENOUS | Status: AC
  Administered 2021-03-19: 1000 mL via INTRAVENOUS

## 2021-03-19 MED ORDER — PHENYLEPHRINE 40 MCG/ML (10ML) SYRINGE FOR IV PUSH (FOR BLOOD PRESSURE SUPPORT): 400.0000 ug | PREFILLED_SYRINGE | Freq: Once | INTRAVENOUS | Status: AC

## 2021-03-19 MED ORDER — ORAL CARE MOUTH RINSE: 15.0000 mL | OROMUCOSAL | Status: DC

## 2021-03-19 MED ORDER — LORAZEPAM 2 MG/ML IJ SOLN
2.0000 mg | Freq: Once | INTRAMUSCULAR | Status: AC
  Administered 2021-03-19: 2 mg via INTRAVENOUS
  Filled 2021-03-19: qty 1

## 2021-03-19 MED ORDER — MIDAZOLAM HCL 2 MG/2ML IJ SOLN
INTRAMUSCULAR | Status: AC
  Filled 2021-03-19: qty 2

## 2021-03-19 MED ORDER — SODIUM CHLORIDE 0.9 % IV SOLN: INTRAVENOUS | Status: DC | PRN

## 2021-03-19 MED ORDER — HEPARIN SODIUM (PORCINE) 5000 UNIT/ML IJ SOLN
5000.0000 [IU] | Freq: Three times a day (TID) | INTRAMUSCULAR | Status: DC
  Administered 2021-03-19 – 2021-03-20 (×3): 5000 [IU] via SUBCUTANEOUS
  Filled 2021-03-19 (×3): qty 1

## 2021-03-19 MED ORDER — PANTOPRAZOLE SODIUM 40 MG IV SOLR
40.0000 mg | Freq: Every day | INTRAVENOUS | Status: DC
  Administered 2021-03-19 – 2021-03-20 (×2): 40 mg via INTRAVENOUS
  Filled 2021-03-19 (×2): qty 40

## 2021-03-19 MED ORDER — ETOMIDATE 2 MG/ML IV SOLN
INTRAVENOUS | Status: AC
  Administered 2021-03-19: 20 mg via INTRAVENOUS
  Filled 2021-03-19: qty 20

## 2021-03-19 MED ORDER — SODIUM CHLORIDE 0.9 % IV SOLN: 2.0000 g | Freq: Every day | INTRAVENOUS | Status: DC

## 2021-03-19 MED ORDER — NOREPINEPHRINE 4 MG/250ML-% IV SOLN
INTRAVENOUS | Status: AC
  Administered 2021-03-19: 10 ug/min via INTRAVENOUS
  Filled 2021-03-19: qty 250

## 2021-03-19 MED ORDER — FENTANYL 2500MCG IN NS 250ML (10MCG/ML) PREMIX INFUSION
25.0000 ug/h | INTRAVENOUS | Status: DC
  Administered 2021-03-19: 25 ug/h via INTRAVENOUS
  Filled 2021-03-19: qty 250

## 2021-03-19 MED ORDER — ADULT MULTIVITAMIN LIQUID CH
15.0000 mL | Freq: Every day | ORAL | Status: DC
  Administered 2021-03-20: 15 mL
  Filled 2021-03-19 (×2): qty 15

## 2021-03-19 MED ORDER — SODIUM CHLORIDE 0.9 % IV BOLUS: 1000.0000 mL | Freq: Once | INTRAVENOUS | Status: DC

## 2021-03-19 MED ORDER — INSULIN ASPART 100 UNIT/ML IJ SOLN: 0.0000 [IU] | Freq: Three times a day (TID) | INTRAMUSCULAR | Status: DC

## 2021-03-19 MED ORDER — CHLORHEXIDINE GLUCONATE 0.12% ORAL RINSE (MEDLINE KIT)
15.0000 mL | Freq: Two times a day (BID) | OROMUCOSAL | Status: DC
  Administered 2021-03-19 – 2021-03-20 (×3): 15 mL via OROMUCOSAL

## 2021-03-19 MED ORDER — ETOMIDATE 2 MG/ML IV SOLN: 20.0000 mg | Freq: Once | INTRAVENOUS | Status: AC

## 2021-03-19 MED ORDER — VANCOMYCIN 50 MG/ML ORAL SOLUTION
500.0000 mg | Freq: Four times a day (QID) | ORAL | Status: DC
  Administered 2021-03-19 – 2021-03-20 (×4): 500 mg
  Filled 2021-03-19 (×7): qty 10

## 2021-03-19 MED ORDER — PHENYLEPHRINE 40 MCG/ML (10ML) SYRINGE FOR IV PUSH (FOR BLOOD PRESSURE SUPPORT): 80.0000 ug | PREFILLED_SYRINGE | Freq: Once | INTRAVENOUS | Status: DC

## 2021-03-19 MED ORDER — NOREPINEPHRINE 16 MG/250ML-% IV SOLN
0.0000 ug/min | INTRAVENOUS | Status: DC
  Administered 2021-03-19 (×2): 40 ug/min via INTRAVENOUS
  Administered 2021-03-20: 80 ug/min via INTRAVENOUS
  Filled 2021-03-19 (×6): qty 250

## 2021-03-19 MED ORDER — PROCHLORPERAZINE MALEATE 10 MG PO TABS: 10.0000 mg | ORAL_TABLET | Freq: Four times a day (QID) | ORAL | Status: DC | PRN

## 2021-03-19 MED ORDER — VASOPRESSIN 20 UNITS/100 ML INFUSION FOR SHOCK
0.0300 [IU]/min | INTRAVENOUS | Status: DC
  Administered 2021-03-19 – 2021-03-20 (×3): 0.03 [IU]/min via INTRAVENOUS
  Filled 2021-03-19 (×3): qty 100

## 2021-03-19 MED ORDER — DEXAMETHASONE 2 MG PO TABS
2.0000 mg | ORAL_TABLET | Freq: Every day | ORAL | Status: DC
  Administered 2021-03-20: 2 mg
  Filled 2021-03-19: qty 1

## 2021-03-19 MED ORDER — NOREPINEPHRINE 4 MG/250ML-% IV SOLN
0.0000 ug/min | INTRAVENOUS | Status: DC
  Administered 2021-03-19 (×2): 40 ug/min via INTRAVENOUS

## 2021-03-19 MED ORDER — BUDESONIDE 0.5 MG/2ML IN SUSP
0.5000 mg | Freq: Two times a day (BID) | RESPIRATORY_TRACT | Status: DC
  Administered 2021-03-19 – 2021-03-20 (×2): 0.5 mg via RESPIRATORY_TRACT
  Filled 2021-03-19 (×2): qty 2

## 2021-03-19 MED ORDER — POLYETHYLENE GLYCOL 3350 17 G PO PACK: 17.0000 g | PACK | Freq: Every day | ORAL | Status: DC | PRN

## 2021-03-19 MED ORDER — MIDAZOLAM HCL 2 MG/2ML IJ SOLN: 1.0000 mg | INTRAMUSCULAR | Status: DC | PRN

## 2021-03-19 MED ORDER — HYDROCORTISONE SOD SUC (PF) 100 MG IJ SOLR: 100.0000 mg | Freq: Three times a day (TID) | INTRAMUSCULAR | Status: DC

## 2021-03-19 MED ORDER — ACETAMINOPHEN 160 MG/5ML PO SOLN
650.0000 mg | Freq: Four times a day (QID) | ORAL | Status: DC | PRN
  Administered 2021-03-19 – 2021-03-20 (×3): 650 mg
  Filled 2021-03-19 (×3): qty 20.3

## 2021-03-19 MED ORDER — FENTANYL CITRATE (PF) 100 MCG/2ML IJ SOLN
25.0000 ug | Freq: Once | INTRAMUSCULAR | Status: AC
  Administered 2021-03-19: 25 ug via INTRAVENOUS

## 2021-03-19 MED ORDER — ORAL CARE MOUTH RINSE
15.0000 mL | OROMUCOSAL | Status: DC
  Administered 2021-03-19 – 2021-03-20 (×8): 15 mL via OROMUCOSAL

## 2021-03-19 MED ORDER — CHLORHEXIDINE GLUCONATE 0.12% ORAL RINSE (MEDLINE KIT): 15.0000 mL | Freq: Two times a day (BID) | OROMUCOSAL | Status: DC

## 2021-03-19 MED ORDER — FENTANYL CITRATE (PF) 100 MCG/2ML IJ SOLN
INTRAMUSCULAR | Status: AC
  Administered 2021-03-19: 50 ug via INTRAVENOUS
  Filled 2021-03-19: qty 2

## 2021-03-19 MED ORDER — HEPARIN BOLUS VIA INFUSION: 3000.0000 [IU] | Freq: Once | INTRAVENOUS | Status: DC

## 2021-03-19 MED ORDER — DEXAMETHASONE 0.5 MG PO TABS: 0.5000 mg | ORAL_TABLET | Freq: Every day | ORAL | Status: DC

## 2021-03-19 MED ORDER — PHENYLEPHRINE HCL-NACL 20-0.9 MG/250ML-% IV SOLN
0.0000 ug/min | INTRAVENOUS | Status: DC
  Administered 2021-03-19: 20 ug/min via INTRAVENOUS

## 2021-03-19 MED ORDER — SODIUM CHLORIDE 0.9% FLUSH: 10.0000 mL | INTRAVENOUS | Status: DC | PRN

## 2021-03-19 MED ORDER — SODIUM CHLORIDE 0.9% FLUSH
10.0000 mL | Freq: Two times a day (BID) | INTRAVENOUS | Status: DC
  Administered 2021-03-19 – 2021-03-20 (×3): 10 mL

## 2021-03-19 MED ORDER — HEPARIN (PORCINE) 25000 UT/250ML-% IV SOLN: 1650.0000 [IU]/h | INTRAVENOUS | Status: DC

## 2021-03-19 MED ORDER — DEXAMETHASONE 2 MG PO TABS: 1.0000 mg | ORAL_TABLET | Freq: Every day | ORAL | Status: DC

## 2021-03-19 NOTE — Consult Note (Signed)
NAME:  Jason Snyder, MRN:  [id number], DOB:  [birth year], LOS: 1 ADMISSION DATE:  03/22/2021, CONSULTATION DATE:  03/19/2021 REFERRING MD:  Dr. Roderic Palau, CHIEF COMPLAINT:  Respiratory distress  History of Present Illness:  HPI obtained from medical chart review as patient is currently intubated and sedated on mechanical ventilation.   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  He presented to ER after symptoms continued.  He was afebrile and hemodynamically stable on admit.  Found to be Cdiff positive with CT abd/ pelvis which showed thickening of the colon wall.  He was started on oral vanc and admitted to [facility].  Oncology following, developed neutropenic fever and started on Granix.  On 10/17, he developed worsening respiratory failure and new AKI overnight, transferred to ICU today given decreased AMS, hypotension, and worsening hypoxia, and required intubation and vasopressor support on arrival.   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 rate 16, height $RemoveBe'6\' 2"'gRCWfSkBT$  (1.88 m), weight 103.4 kg, SpO2 100 %.    Vent Mode: PRVC FiO2 (%):  [32 %-100 %] 100 % Set Rate:  [16 bmp] 16 bmp Vt Set:  [540  mL] 540 mL PEEP:  [5 cmH20] 5 cmH20 Plateau Pressure:  [13 cmH20] 13 cmH20   Intake/Output Summary (Last 24 hours) at 03/19/2021 1304 Last data filed at 03/19/2021 1301 Gross per 24 hour  Intake 2532.3 ml  Output --  Net 2532.3 ml   Filed Weights   03/09/2021 1545  Weight: 103.4 kg   Examination: General:  critically ill elderly male sedated and intubated on MV HEENT: MM pink/moist, ETT, OGT, pupils 3/sluggish Neuro: sedated CV:  ST, no obvious murmur, right port accessed PULM:  MV supported breaths, clear anteriorly, diminished right base, no wheeze GI: soft, bs+, NT, foley  Extremities: warm/dry, +1 LE edema, some mottling of fingers, knees, toes  Resolved Hospital Problem list    Assessment & Plan:   Septic shock related to Cdiff colitis  Immunocompromised patient P:  - NE and vaso via port for now for goal MAP 65 - Additional LR bolus now - Will likely require CVL placement for multiple infusions - aline placed  - Check limited TTE, most recent TTE 02/22/2021 with EF 60-65%, mild LVH, G1DD, normal RV - On decadron, defer stress dose steroids  - continue oral vanc, dose adjusted per pharmacy, adding flagyl and empiric cefepime - send BC and trach asp.  UA neg - monitor stools for increased amount, may need to replete more volume - CMET and CBC in am  - continue enteric precautions  Acute hypoxic respiratory failure  Likely bibasilar atelectasis, can not rule out possible aspiration component  Hx COPD- no exacerbation P:  - possible PE  suspected given current malignancy, but LE dupplex neg for DVT on 10/10.  Will hold on heparin gtt at this time - Full MV support - ABG - VAP prevention protocol/ PPI - PAD protocol for sedation> given ongoing hypotension, will change to low dose fentanyl gtt and prn versed, RASS goal 0/-1 - wean FiO2 as able for SpO2 >92%  - daily SAT & SBT when able - send sputum cx - abx as above, MRSA PCR neg - continue duonebs, brovanna, and  pulmicort nebs  AKI likely secondary to hypoperfusion +/- obstructive given urinary retention +/- contrast nephropathy  Hyponatremia- mild  NAGMA likely due to diarrhea - sCr 0.72 -> 2.46 today  - foley placed earlier for bladder scan > 700 ml, continue foley  - renal ultrasound pending  - send urine lytes  - trend renal indices/ strict I/Os - Replace electrolytes as indicated - Avoid nephrotoxic agents, ensure adequate renal perfusion  Neutropenic fever 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  Recent dx 01/2021, on first line chemo, last dose 03/08/21, and radiation Chronic RLE weakness r/t mets  - oncology following - continue decadron taper - started on G-CSF 10/16  Thrombocytopenia - felt related to recent chemo - trend CBC, oncology following  Transaminitis  - mild, recheck LFTs in AM  HTN- hold home amlodipine and metoprolol given shock  HLD - hold statin, repeat LFTs in am  BPH- hold tamsulosin and finasteride for now  Best Practice (right click and "Reselect all SmartList Selections" daily)   Diet/type: NPO DVT prophylaxis: prophylactic heparin  GI prophylaxis: PPI Lines: N/A; pta port right chest accessed Foley:  Yes, and it is still needed Code Status:  limited, no CPR Last date of multidisciplinary goals of care discussion- updated by [facility] on transfer to ICU  Labs   CBC: Recent Labs  Lab 03/26/2021 1046 03/18/21 0600 03/18/21 0731 03/19/21 0620  WBC 2.1* 0.6* 0.8* 0.6*  NEUTROABS 1.2*  --  0.3* 0.2*  HGB 16.1 15.6 15.7 16.4  HCT 48.3 46.6 46.4 47.3  MCV 95.5 94.5 94.3 92.7  PLT 122* 112* 113* 100*    Basic Metabolic Panel: Recent Labs  Lab 03/25/2021 1046 03/18/21 0600 03/19/21 0620  NA 136 135 133*  K 4.0 4.0 4.5  CL 100 104 101  CO2 20* 16* 21*  GLUCOSE 186* 223* 410*  BUN 18 15 27*  CREATININE 0.86 0.72 2.46*  CALCIUM 7.9* 7.3* 7.3*  MG 2.2  --   --    GFR: Estimated Creatinine Clearance: 37.4 mL/min (A) (by C-G formula  based on SCr of 2.46 mg/dL (H)). Recent Labs  Lab 03/11/2021 1046 03/18/21 0600 03/18/21 0731 03/19/21 0620  WBC 2.1* 0.6* 0.8* 0.6*    Liver Function Tests: Recent Labs  Lab 03/12/2021 1046 03/18/21 0600 03/19/21 0620  AST 27 20 77*  ALT 60* 46* 85*  ALKPHOS 53 50 46  BILITOT 2.0* 2.0* 2.4*  PROT 5.5* 5.2* 4.4*  ALBUMIN 3.1* 2.8* 2.2*   No results for input(s): LIPASE, AMYLASE in the last 168 hours. No results for input(s): AMMONIA in the last 168 hours.  ABG    Component Value Date/Time   PHART 7.480 (H) 03/19/2021 1046   PCO2ART 21.2 (L) 03/19/2021 1046   PO2ART 70.9 (L) 03/19/2021 1046   HCO3 15.6 (L) 03/19/2021 1046   ACIDBASEDEF 5.1 (H) 03/19/2021 1046   O2SAT 94.3 03/19/2021 1046     Coagulation Profile: No results for input(s): INR, PROTIME in the last  168 hours.  Cardiac Enzymes: No results for input(s): CKTOTAL, CKMB, CKMBINDEX, TROPONINI in the last 168 hours.  HbA1C: Hemoglobin A1C  Date/Time Value Ref Range Status  01/11/2021 08:45 AM 6.9 (A) 4.0 - 5.6 % Final  07/12/2020 08:46 AM 6.7 (A) 4.0 - 5.6 % Final   Hgb A1c MFr Bld  Date/Time Value Ref Range Status  04/01/2021 10:46 AM 9.9 (H) 4.8 - 5.6 % Final    Comment:    (NOTE) Pre diabetes:          5.7%-6.4%  Diabetes:              >6.4%  Glycemic control for   <7.0% adults with diabetes     CBG: Recent Labs  Lab 03/18/21 1628 03/18/21 2133 03/19/21 0753 03/19/21 0951 03/19/21 1216  GLUCAP 254* 363* 449* 310* 270*    Review of Systems:   Unable   Past Medical History:  He,  has a past medical history of BMI 30.0-30.9,adult, BPH without obstruction/lower urinary tract symptoms (10/03/2017), CAD (coronary artery disease), Cancer ([place]), Chronic left shoulder pain (c), Chronic low back pain with right-sided sciatica, Chronic pain (10/07/2013), Combined hyperlipidemia associated with type 2 diabetes mellitus ([place]) (07/09/2019), Controlled type 2 diabetes mellitus without complication,  without long-term current use of insulin ([place]) (07/12/2014), COPD (chronic obstructive pulmonary disease) ([place]) (01/07/2014), Coronary artery disease involving native coronary artery of native heart without angina pectoris, DDD (degenerative disc disease), lumbar, Diabetes mellitus without complication ([place]), 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   Surgical History:   Past Surgical History:  Procedure Laterality Date   ARTHROSCOPIC REPAIR ACL     BIOPSY  01/26/2021   Procedure: BIOPSY;  Surgeon: Thornton Park, MD;  Location: [place];  Service: Gastroenterology;;   CARDIAC CATHETERIZATION     ESOPHAGOGASTRODUODENOSCOPY (EGD) WITH PROPOFOL N/A 01/26/2021   Procedure: ESOPHAGOGASTRODUODENOSCOPY (EGD) WITH PROPOFOL;  Surgeon: Thornton Park, MD;  Location: [place];  Service: Gastroenterology;  Laterality: N/A;   INTRAVASCULAR PRESSURE WIRE/FFR STUDY N/A 01/01/2019   Procedure: INTRAVASCULAR PRESSURE WIRE/FFR STUDY;  Surgeon: Leonie Man, MD;  Location: [place] [facility];  Service: Cardiovascular;  Laterality: N/A;   IR IMAGING GUIDED PORT INSERTION  02/12/2021   LEFT HEART CATH AND CORONARY ANGIOGRAPHY N/A 01/01/2019   Procedure: LEFT HEART CATH AND CORONARY ANGIOGRAPHY;  Surgeon: Leonie Man, MD;  Location: [place] [facility];  Service: Cardiovascular;  Laterality: N/A;   PROSTATE CRYOABLATION     03/2019   SHOULDER ARTHROSCOPY      SPINE SURGERY       Social History:   reports that he quit smoking about 3 years ago. His smoking use included cigarettes. He has a 15.00 pack-year smoking history. He has never used smokeless tobacco. He reports that he does not currently use alcohol. He reports that he does not currently use drugs.   Family History:  His family history includes Heart disease in his father and maternal uncle; Lung cancer in his father and sister; Pulmonary embolism in his mother.   Allergies Allergies  Allergen Reactions   Rubus Fruticosus Rash    Blackberries  Home Medications  Prior to Admission medications   Medication Sig Start Date End Date Taking? Authorizing Provider  acetaminophen-codeine (TYLENOL #3) 300-30 MG tablet take 1 tablet by mouth twice a day as needed for moderate pain Patient taking differently: Take 1 tablet by mouth 2 (two) times daily as needed for moderate pain. 03/06/21  Yes [name], MD  albuterol (VENTOLIN HFA) 108 (90 Base) MCG/ACT inhaler INHALE TWO PUFFS BY MOUTH INTO THE LUNGS EVERY FOUR HOURS AS NEEDED FOR WHEEZING Patient taking differently: Inhale 2 puffs into the lungs every 4 (four) hours as needed for wheezing. 02/19/21  Yes [name], MD  amLODipine (NORVASC) 10 MG tablet Take 1 tablet (10 mg total) by mouth daily. 01/26/21  Yes Simmons-Robinson, Makiera, MD  aspirin 81 MG EC tablet Take 81 mg by mouth daily.    Yes [provider]  atorvastatin (LIPITOR) 20 MG tablet Take 1 tablet (20 mg total) by mouth at bedtime. 07/12/20  Yes [name], MD  capecitabine (XELODA) 500 MG tablet Take 4 tablets (2,000 mg total) by mouth 2 (two) times daily. Take 14 days on, 7 days off, repeat every 21 days. 02/28/21  Yes Truitt Merle, MD  cyclobenzaprine (FLEXERIL) 10 MG tablet TAKE ONE TABLET BY MOUTH THREE TIMES DAILY AS NEEDED Patient taking differently: Take 10 mg by mouth 3 (three) times daily as needed for muscle spasms. 03/05/21  Yes [name], MD   dexamethasone (DECADRON) 2 MG tablet Taper to 2 tablets ($RemoveBe'4mg'[id number]$ ) twice daily until instructed otherwise. Patient taking differently: Take 0.5-4 mg by mouth as directed. Taper to 2 tablets ($RemoveBe'4mg'JfkIZpBBp$ ) twice daily ,thenTaking 2 mg daily for 1 week, then 1 mg daily for 1 week , then 0.5 mg daily for 1 week. 03/05/21  Yes Eppie Gibson, MD  diclofenac sodium (VOLTAREN) 1 % GEL Apply 2 g topically 4 (four) times daily as needed (pain). 04/21/17  Yes [provider]  FARXIGA 10 MG TABS tablet TAKE ONE TABLET BY MOUTH ONE TIME DAILY Patient taking differently: Take 10 mg by mouth daily. 08/28/20  Yes [name], MD  finasteride (PROSCAR) 5 MG tablet Take 5 mg by mouth daily. 02/13/21  Yes [provider]  fluticasone (FLONASE) 50 MCG/ACT nasal spray Place 1 spray into the nose daily as needed for allergies.  04/09/13  Yes [provider]  gabapentin (NEURONTIN) 300 MG capsule Take 1 capsule (300 mg total) by mouth 3 (three) times daily. 01/11/21  Yes [name], MD  Ketotifen Fumarate (ALLERGY EYE DROPS OP) Place 1 drop into both eyes daily as needed (allergy).   Yes [provider]  lidocaine (XYLOCAINE) 2 % solution Mix 1 part 2% viscous lidocaine with 1 part water. Swish and swallow 92mL of diluted mixture --use 30 minutes before meals and at bedtime, up to 4 times a day 03/05/21  Yes Eppie Gibson, MD  lidocaine-prilocaine (EMLA) cream Apply 1 application topically as needed. Patient taking differently: Apply 1 application topically as needed (for treatments). 02/02/21  Yes Heilingoetter, Cassandra L, PA-C  Loratadine 10 MG CAPS Take 10 mg by mouth daily as needed (allergies).    Yes [provider]  metFORMIN (GLUCOPHAGE) 500 MG tablet Take 1 tablet (500 mg total) by mouth 2 (two) times daily with a meal. 01/11/21  Yes [name], MD  metoprolol tartrate (LOPRESSOR) 50 MG tablet Take 1 tablet (50 mg total) by mouth 2 (two) times daily. 04/11/20  Yes Billey Chang  L, MD  Multiple Vitamin (MULTIVITAMIN) capsule Take 1 capsule by mouth daily.    Yes [provider]  nitroGLYCERIN (NITROSTAT) 0.4 MG SL tablet Place 1 tablet (0.4 mg total) under the tongue every 5 (five) minutes as needed for chest pain. 08/28/20  Yes Richardo Priest, MD  ondansetron (ZOFRAN) 8 MG tablet Take 1 tablet (8 mg total) by mouth 2 (two) times daily as needed for refractory nausea / vomiting. Start on day 3 after chemotherapy. 02/12/21  Yes Truitt Merle, MD  pantoprazole (PROTONIX) 40 MG tablet Take 1 tablet (40 mg total) by mouth daily. 02/21/21  Yes Truitt Merle, MD  prochlorperazine (COMPAZINE) 10 MG tablet Take 1 tablet (10 mg total) by mouth every 6 (six) hours as needed (Nausea or vomiting). 02/12/21  Yes Truitt Merle, MD  Spacer/Aero-Holding Chambers (AEROCHAMBER MV) inhaler Use as instructed 11/20/20  Yes Magdalen Spatz, NP  tamsulosin (FLOMAX) 0.4 MG CAPS capsule TAKE TWO CAPSULES BY MOUTH DAILY Patient taking differently: Take 0.4 mg by mouth daily. 03/02/20  Yes [name], MD  Tiotropium Bromide-Olodaterol (STIOLTO RESPIMAT) 2.5-2.5 MCG/ACT AERS Inhale 2 puffs into the lungs daily. 10/10/20  Yes Mannam, Hart Robinsons, MD  Accu-Chek Softclix Lancets lancets Use as directed up to 4 times daily 01/26/21   Simmons-Robinson, Makiera, MD  acetic acid 2 % otic solution Place 4 drops into both ears 2 (two) times daily as needed. Patient not taking: Reported on 03/18/2021 10/06/19   [name], MD  Blood Glucose Monitoring Suppl (BLOOD GLUCOSE MONITOR SYSTEM) w/Device KIT Please check blood glucose three times daily (before meals in the morning and 2 hours after largest meals of the day OR lunch and dinner) 01/26/21   Simmons-Robinson, Riki Sheer, MD  glucose blood (ACCU-CHEK GUIDE) test strip Use as instructed up to 4 times daily 01/26/21   Simmons-Robinson, Makiera, MD  umeclidinium-vilanterol (ANORO ELLIPTA) 62.5-25 MCG/INH AEPB Inhale 1 puff into the lungs daily. 03/06/21   Marshell Garfinkel, MD     Critical care time: 63 mins     Kennieth Rad, ACNP [facility] 03/19/2021, 2:28 PM  See Amion for pager If no response to pager, please call PCCM consult pager After 7:00 pm call Elink

## 2021-03-19 NOTE — Procedures (Signed)
Intubation Procedure Note  Tyran Huser  [id number]  1951/05/14  Date:03/19/21  Time:1:13 PM   Provider Performing:Mell Mellott B Kania Regnier    Procedure: Intubation (40981)  Indication(s) Respiratory Failure  Consent Unable to obtain consent due to emergent nature of procedure.   Anesthesia Etomidate, Versed, Fentanyl, and Rocuronium   Time Out Verified patient identification, verified procedure, site/side was marked, verified correct patient position, special equipment/implants available, medications/allergies/relevant history reviewed, required imaging and test results available.   Sterile Technique Usual hand hygeine, masks, and gloves were used   Procedure Description Patient positioned in bed supine.  Sedation given as noted above.  Patient was intubated with endotracheal tube using Glidescope.  View was Grade 1 full glottis .  Number of attempts was 1.  Colorimetric CO2 detector was consistent with tracheal placement.   Complications/Tolerance None; patient tolerated the procedure well. Chest X-ray is ordered to verify placement.   EBL Minimal   Specimen(s) None

## 2021-03-19 NOTE — Progress Notes (Signed)
PCCM Interval Note  Patient with ongoing shock and MODS despite aggressive care.  Adding third vasopressor.  Wife called and updated that he is not doing well despite ongoing aggressive care and that he may not survive the night.    Unfortunately, she is at home but has her grand children overnight as her son is out of town and unable to make it tonight.  She doesn't drive at night either.  I offered her that if she found a way to make it, she was welcome to visit anytime tonight.  She is thankful for the update.    P:  Continue aggressive care but no CPR  Additional LR bolus now Adding third pressor  Adding solucortef       Kennieth Rad, ACNP [facility] 03/19/2021, 7:08 PM  See Amion for pager If no response to pager, please call PCCM consult pager After 7:00 pm call Elink

## 2021-03-19 NOTE — Progress Notes (Signed)
Jason Snyder   DOB:1950-07-17   UJ#:[id number]   NFA#:[id number]  Oncology f/u   Subjective: Patient was transferred to ICU this morning due to altered mental status, and was intubated, on vasopressors now.  He is on the vent with minimal sedation, unresponsive, family not at the bedside.  I spoke with his nurse.   Objective:  Vitals:   03/19/21 1615 03/19/21 1730  BP:  (!) 88/67  Pulse: (!) 137 (!) 148  Resp: (!) 29 (!) 31  Temp: 99.1 F (37.3 C)   SpO2: 93% 94%    Body mass index is 28.28 kg/m.  Intake/Output Summary (Last 24 hours) at 03/19/2021 1737 Last data filed at 03/19/2021 1614 Gross per 24 hour  Intake 4153.68 ml  Output 825 ml  Net 3328.68 ml     Pt intubated   Bilateral lung clear  Tachycardia, regular rhythm.  Abdomen soft   No leg edema   CBG (last 3)  Recent Labs    03/19/21 0951 03/19/21 1216 03/19/21 1521  GLUCAP 310* 270* 298*     Labs:  Urine Studies No results for input(s): UHGB, CRYS in the last 72 hours.  Invalid input(s): UACOL, UAPR, USPG, UPH, UTP, UGL, UKET, UBIL, UNIT, UROB, [place], UEPI, UWBC, [place], [place], [place], [place], [place]  Basic Metabolic Panel: Recent Labs  Lab 03/18/2021 1046 03/18/21 0600 03/19/21 0620 03/19/21 1519  NA 136 135 133* 136  K 4.0 4.0 4.5 4.3  CL 100 104 101 100  CO2 20* 16* 21* 22  GLUCOSE 186* 223* 410* 316*  BUN 18 15 27* 26*  CREATININE 0.86 0.72 2.46* 3.61*  CALCIUM 7.9* 7.3* 7.3* 7.4*  MG 2.2  --   --   --    GFR Estimated Creatinine Clearance: 25.1 mL/min (A) (by C-G formula based on SCr of 3.61 mg/dL (H)). Liver Function Tests: Recent Labs  Lab 03/08/2021 1046 03/18/21 0600 03/19/21 0620 03/19/21 1519  AST 27 20 77* 133*  ALT 60* 46* 85* 107*  ALKPHOS 53 50 46 47  BILITOT 2.0* 2.0* 2.4* 1.6*  PROT 5.5* 5.2* 4.4* 4.3*  ALBUMIN 3.1* 2.8* 2.2* 2.0*   No results for input(s): LIPASE, AMYLASE in the last 168 hours. No results for input(s): AMMONIA in the last 168 hours. Coagulation  profile Recent Labs  Lab 03/19/21 1329  INR 2.4*    CBC: Recent Labs  Lab 03/25/2021 1046 03/18/21 0600 03/18/21 0731 03/19/21 0620  WBC 2.1* 0.6* 0.8* 0.6*  NEUTROABS 1.2*  --  0.3* 0.2*  HGB 16.1 15.6 15.7 16.4  HCT 48.3 46.6 46.4 47.3  MCV 95.5 94.5 94.3 92.7  PLT 122* 112* 113* 100*   Cardiac Enzymes: No results for input(s): CKTOTAL, CKMB, CKMBINDEX, TROPONINI in the last 168 hours. BNP: Invalid input(s): POCBNP CBG: Recent Labs  Lab 03/18/21 2133 03/19/21 0753 03/19/21 0951 03/19/21 1216 03/19/21 1521  GLUCAP 363* 449* 310* 270* 298*   D-Dimer No results for input(s): DDIMER in the last 72 hours. Hgb A1c Recent Labs    03/08/2021 1046  HGBA1C 9.9*   Lipid Profile No results for input(s): CHOL, HDL, LDLCALC, TRIG, CHOLHDL, LDLDIRECT in the last 72 hours. Thyroid function studies No results for input(s): TSH, T4TOTAL, T3FREE, THYROIDAB in the last 72 hours.  Invalid input(s): FREET3 Anemia work up No results for input(s): VITAMINB12, FOLATE, FERRITIN, TIBC, IRON, RETICCTPCT in the last 72 hours. Microbiology Recent Results (from the past 240 hour(s))  C Difficile Quick Screen w PCR reflex  Status: Abnormal   Collection Time: 03/28/2021 11:32 AM   Specimen: STOOL  Result Value Ref Range Status   C Diff antigen POSITIVE (A) NEGATIVE Final   C Diff toxin POSITIVE (A) NEGATIVE Final   C Diff interpretation Toxin producing C. difficile detected.  Final    Comment: CRITICAL RESULT CALLED TO, READ BACK BY AND VERIFIED WITH: CARRIE SIMPSON RN Performed at [facility] [address] [place], [place] [address]   Resp Panel by RT-PCR (Flu A&B, Covid) Nasopharyngeal Swab     Status: None   Collection Time: 03/05/2021  1:42 PM   Specimen: Nasopharyngeal Swab; Nasopharyngeal(NP) swabs in vial transport medium  Result Value Ref Range Status   SARS Coronavirus 2 by RT PCR NEGATIVE NEGATIVE Final    Comment: (NOTE) SARS-CoV-2 target nucleic  acids are NOT DETECTED.  The SARS-CoV-2 RNA is generally detectable in upper respiratory specimens during the acute phase of infection. The lowest concentration of SARS-CoV-2 viral copies this assay can detect is 138 copies/mL. A negative result does not preclude SARS-Cov-2 infection and should not be used as the sole basis for treatment or other patient management decisions. A negative result may occur with  improper specimen collection/handling, submission of specimen other than nasopharyngeal swab, presence of viral mutation(s) within the areas targeted by this assay, and inadequate number of viral copies(<138 copies/mL). A negative result must be combined with clinical observations, patient history, and epidemiological information. The expected result is Negative.  Fact Sheet for Patients:  EntrepreneurPulse.com.au  Fact Sheet for Healthcare Providers:  IncredibleEmployment.be  This test is no t yet approved or cleared by the [place] FDA and  has been authorized for detection and/or diagnosis of SARS-CoV-2 by FDA under an Emergency Use Authorization (EUA). This EUA will remain  in effect (meaning this test can be used) for the duration of the COVID-19 declaration under Section 564(b)(1) of the Act, 21 U.S.C.section 360bbb-3(b)(1), unless the authorization is terminated  or revoked sooner.       Influenza A by PCR NEGATIVE NEGATIVE Final   Influenza B by PCR NEGATIVE NEGATIVE Final    Comment: (NOTE) The Xpert Xpress SARS-CoV-2/FLU/RSV plus assay is intended as an aid in the diagnosis of influenza from Nasopharyngeal swab specimens and should not be used as a sole basis for treatment. Nasal washings and aspirates are unacceptable for Xpert Xpress SARS-CoV-2/FLU/RSV testing.  Fact Sheet for Patients: EntrepreneurPulse.com.au  Fact Sheet for Healthcare Providers: IncredibleEmployment.be  This  test is not yet approved or cleared by the [place] FDA and has been authorized for detection and/or diagnosis of SARS-CoV-2 by FDA under an Emergency Use Authorization (EUA). This EUA will remain in effect (meaning this test can be used) for the duration of the COVID-19 declaration under Section 564(b)(1) of the Act, 21 U.S.C. section 360bbb-3(b)(1), unless the authorization is terminated or revoked.  Performed at [facility], [place] [address], [place], [place] [address]   MRSA Next Gen by PCR, Nasal     Status: None   Collection Time: 03/19/21 11:51 AM   Specimen: Nasal Mucosa; Nasal Swab  Result Value Ref Range Status   MRSA by PCR Next Gen NOT DETECTED NOT DETECTED Final    Comment: (NOTE) The GeneXpert MRSA Assay (FDA approved for NASAL specimens only), is one component of a comprehensive MRSA colonization surveillance program. It is not intended to diagnose MRSA infection nor to guide or monitor treatment for MRSA infections. Test performance is not FDA approved  in patients less than 67 years old. Performed at [facility], [place] [address], [place],  [address]       Studies:  DG Abd 1 View  Result Date: 03/19/2021 CLINICAL DATA:  Feeding tube placement EXAM: ABDOMEN - 1 VIEW COMPARISON:  03/26/2021 FINDINGS: Limited radiograph of the lower chest and upper abdomen was obtained for the purposes of enteric tube localization. Enteric tube is seen coursing below the diaphragm with distal tip and side port coiled within the gastric fundus. IMPRESSION: Enteric tube coiled within the gastric fundus. Electronically Signed   By: Davina Poke D.O.   On: 03/19/2021 13:06   DG Chest Port 1 View  Result Date: 03/19/2021 CLINICAL DATA:  Intubation. EXAM: PORTABLE CHEST 1 VIEW COMPARISON:  03/19/2021 at 11:10 a.m. FINDINGS: Endotracheal tube has been placed and terminates at the inferior aspect of the clavicular heads, well above the  carina. An enteric tube has been placed and loops over the proximal stomach. A right jugular Port-A-Cath remains in place, terminating over the lower SVC. The cardiomediastinal silhouette is unchanged with normal heart size. Lung volumes are low with increased, mild opacity in both lung bases. No sizable pleural effusion or pneumothorax is identified. IMPRESSION: 1. Support devices as above. 2. Low lung volumes with bibasilar atelectasis. Electronically Signed   By: Logan Bores M.D.   On: 03/19/2021 12:48   DG CHEST PORT 1 VIEW  Result Date: 03/19/2021 CLINICAL DATA:  Esophageal cancer with altered consciousness. EXAM: PORTABLE CHEST 1 VIEW COMPARISON:  03/18/2021 FINDINGS: Reverse apical lordotic positioning. Right Port-A-Cath tip at low SVC. Patient rotated right. Normal heart size. No pleural effusion or pneumothorax. Clear left lung. Increased density over the right upper lung is favored to be due to patient obliquity and osseous summation. IMPRESSION: No acute cardiopulmonary disease. Electronically Signed   By: Abigail Miyamoto M.D.   On: 03/19/2021 11:50   DG CHEST PORT 1 VIEW  Result Date: 03/18/2021 CLINICAL DATA:  Metastatic esophageal cancer. Shortness of breath, fever, fatigue. EXAM: PORTABLE CHEST 1 VIEW COMPARISON:  11/30/2020 FINDINGS: Right Port-A-Cath is in place with the tip in the SVC. Heart is normal size. No confluent opacities or effusions. No acute bony abnormality. IMPRESSION: No active disease. Electronically Signed   By: Rolm Baptise M.D.   On: 03/18/2021 16:01    Assessment: 70 y.o. 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 Given his recent metastatic cancer diagnosis, hopefully he will turn around and recover.  Certainly if he remains critically ill for prolonged period of time, we an discuss CODE STATUS with family, given his incurable nature of metastatic cancer. -I will f/u later this week.   Truitt Merle, MD 03/19/2021  5:37 PM

## 2021-03-19 NOTE — Progress Notes (Signed)
CBG was 363 mg/dl and no night time coverage. Notified Blount NP and received 3 units of Novolog x 1 dose. Will continue to monitor.

## 2021-03-19 NOTE — Progress Notes (Addendum)
[facility] Progress Note Patient Name: Jason Snyder DOB: March 08, 1951 MRN: [id number]   Date of Service  03/19/2021  HPI/Events of Note  Notified of rhythmic movement paeri-orally.  Pt also more tachycardic on the monitor on 3 pressors.   eICU Interventions  Give ativan 2mg  IV --> this resolved the seizure-like activity.  Apply ceribell on to evaluate for possible seizures. Get CT head when stable.      Intervention Category Major Interventions: Seizures - evaluation and management  Elsie Lincoln 03/19/2021, 11:50 PM  12:05 AM Patient hypotensive with BP 61/43 maxed on 3 pressors.  Troponin also continues to rise.   Plan> Increase max dose of levophed to 71mcg/min.  Recheck BMP and lactate. Hold off on full dose anticoagulation given thrombocytopenia.  Clinical picture would also indicate this is more likely from demand ischemia.   2:20 AM Pt is now maxed on 3 pressors with SBP in the 70s.  Family at the bedside and RN was able to update the family with grim prognosis.   Plan> Add epinephrine gtt.   4:35 AM Notified of pH of 7.12/43.7/99.1.  Plan> Start on HCO3 gtt.   5:14 AM Pt is going into multi-organ failure.  Notified of lactate at 6, K 5.6.  Plan> Pt on HCO3 gtt.  Give kayexalate via OGT.

## 2021-03-19 NOTE — Progress Notes (Signed)
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  He was found to have C. difficile colitis.  He was admitted for further management and started on oral vancomycin.  He is noted to have neutropenic fever and was started on G-CSF.  Oncology following.  Last chemo was on 10/6. Hospital course complicated by development of worsening respiratory failure.  Patient was transferred to the ICU.  He was intubated on 10/17.   Assessment & Plan:   Active Problems:   C. difficile colitis   [place]ut[place]enic [place] ([place])   Acute respiratory failure with hypoxia -Noted to have worsening shortness of breath and hypoxia overnight -Currently having short shallow breathing -ABG repeated, PCO2 is low, but he is significantly hypoxic on room nonrebreather -He is also noted to be significantly tachycardic with heart rate in the 130s to 140s -Repeat chest x-ray has been ordered -Patient will be moved to ICU, I suspect he will need intubation -Current decompensation may be related to PE (hypoxia appears to have suddenly worsened, he is tachycardic, history of malignancy).  I have empirically started him on IV heparin for now  C. difficile colitis -Currently on oral vancomycin -Continue to monitor frequency of stools  Acute kidney injury -Creatinine up to 2.46 today, normal yesterday -Staff reports greater than 700 cc on bladder scan -Since he is currently critically ill, will place Foley catheter -Check renal ultrasound for hydronephrosis -Continue IV hydration -He also received a dose of IV contrast on 10/15 -Suspect that hypovolemia from diarrhea may also be playing a role  Neutropenic fever -Patient has been getting chemotherapy -Appreciate oncology input -Started on G-CSF  Thrombocytopenia -Likely related to  chemo -Continue to follow and treat supportively  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  Metabolic acidosis -Suspect is related to increased stool output in the setting of C. difficile -Started on bicarbonate infusion with improvement of serum bicarb  COPD -He is continued on bronchodilators and inhaled steroids -He has not had any specific wheezing -He is not on chronic oxygen at home -Worseningrespiratoryfailure addressed above  Hypertension -Holding home dose of amlodipine and metoprolol since blood pressures are running low  Hyperlipidemia -Continue on statin  BPH -Continued on tamsulosin and finasteride  DVT prophylaxis: Heparin infusion  Code Status: Limited code, no chest compressions or defibrillation Family Communication: Updated patient's wife over the phone Disposition Plan: Status is: Inpatient  Remains inpatient appropriate because: Inability to take p.o., needs continued treatment for C. difficile colitis worsening respiratory failure    Consultants:  Oncology PCCM  Procedures:  ETT 10/17>  Antimicrobials:  Oral vancomycin 10/15 >   Subjective: Patient is somnolent today.  He feels more short of breath today.  He was on 3 L of oxygen, subsequently transition to nonrebreather.  Objective: Vitals:   03/19/21 0448 03/19/21 0500 03/19/21 0736 03/19/21 0738  BP: (!) 147/91     Pulse: (!) 109     Resp: 17     Temp: 98.2 F (36.8 C)     TempSrc:      SpO2: (!) 83% 91% 92% 92%  Weight:      Height:  Intake/Output Summary (Last 24 hours) at 03/19/2021 1138 Last data filed at 03/19/2021 0700 Gross per 24 hour  Intake 1123.88 ml  Output --  Net 1123.88 ml   Filed Weights   03/29/2021 1545  Weight: 103.4 kg     Examination:  General exam: Patient is lethargic, does briefly wake up with voice, noted to have cyanosis in his nose Respiratory system: Bilateral rhonchi.  Short shallow breathing Cardiovascular system: Tachycardic. No murmurs, rubs, gallops. Gastrointestinal system: Abdomen is distended, soft and nontender. No organomegaly or masses felt. Normal bowel sounds heard. Central nervous system: Patient is lethargic, unable to assess further neuro exam Extremities: He has trace edema bilaterally in the lower extremities, fingers are noted to be cyanotic Skin: No rashes, lesions or ulcers Psychiatry: Lethargic     Data Reviewed: I have personally reviewed following labs and imaging studies  CBC: Recent Labs  Lab 03/19/2021 1046 03/18/21 0600 03/18/21 0731 03/19/21 0620  WBC 2.1* 0.6* 0.8* 0.6*  NEUTROABS 1.2*  --  0.3* 0.2*  HGB 16.1 15.6 15.7 16.4  HCT 48.3 46.6 46.4 47.3  MCV 95.5 94.5 94.3 92.7  PLT 122* 112* 113* 426*   Basic Metabolic Panel: Recent Labs  Lab 03/16/2021 1046 03/18/21 0600 03/19/21 0620  NA 136 135 133*  K 4.0 4.0 4.5  CL 100 104 101  CO2 20* 16* 21*  GLUCOSE 186* 223* 410*  BUN 18 15 27*  CREATININE 0.86 0.72 2.46*  CALCIUM 7.9* 7.3* 7.3*  MG 2.2  --   --    GFR: Estimated Creatinine Clearance: 37.4 mL/min (A) (by C-G formula based on SCr of 2.46 mg/dL (H)). Liver Function Tests: Recent Labs  Lab 03/10/2021 1046 03/18/21 0600 03/19/21 0620  AST 27 20 77*  ALT 60* 46* 85*  ALKPHOS 53 50 46  BILITOT 2.0* 2.0* 2.4*  PROT 5.5* 5.2* 4.4*  ALBUMIN 3.1* 2.8* 2.2*   No results for input(s): LIPASE, AMYLASE in the last 168 hours. No results for input(s): AMMONIA in the last 168 hours. Coagulation Profile: No results for input(s): INR, PROTIME in the last 168 hours. Cardiac Enzymes: No results for input(s): CKTOTAL, CKMB, CKMBINDEX, TROPONINI in the last 168 hours. BNP (last 3 results) No results for input(s): PROBNP in the last 8760  hours. HbA1C: Recent Labs    03/03/2021 1046  HGBA1C 9.9*   CBG: Recent Labs  Lab 03/18/21 1148 03/18/21 1628 03/18/21 2133 03/19/21 0753 03/19/21 0951  GLUCAP 284* 254* 363* 449* 310*   Lipid Profile: No results for input(s): CHOL, HDL, LDLCALC, TRIG, CHOLHDL, LDLDIRECT in the last 72 hours. Thyroid Function Tests: No results for input(s): TSH, T4TOTAL, FREET4, T3FREE, THYROIDAB in the last 72 hours. Anemia Panel: No results for input(s): VITAMINB12, FOLATE, FERRITIN, TIBC, IRON, RETICCTPCT in the last 72 hours. Sepsis Labs: No results for input(s): PROCALCITON, LATICACIDVEN in the last 168 hours.  Recent Results (from the past 240 hour(s))  C Difficile Quick Screen w PCR reflex     Status: Abnormal   Collection Time: 03/10/2021 11:32 AM   Specimen: STOOL  Result Value Ref Range Status   C Diff antigen POSITIVE (A) NEGATIVE Final   C Diff toxin POSITIVE (A) NEGATIVE Final   C Diff interpretation Toxin producing C. difficile detected.  Final    Comment: CRITICAL RESULT CALLED TO, READ BACK BY AND VERIFIED WITH: CARRIE SIMPSON RN Performed at [facility] [address] [place], [place] [address]   Resp Panel by RT-PCR (Flu A&B,  Covid) Nasopharyngeal Swab     Status: None   Collection Time: 03/12/2021  1:42 PM   Specimen: Nasopharyngeal Swab; Nasopharyngeal(NP) swabs in vial transport medium  Result Value Ref Range Status   SARS Coronavirus 2 by RT PCR NEGATIVE NEGATIVE Final    Comment: (NOTE) SARS-CoV-2 target nucleic acids are NOT DETECTED.  The SARS-CoV-2 RNA is generally detectable in upper respiratory specimens during the acute phase of infection. The lowest concentration of SARS-CoV-2 viral copies this assay can detect is 138 copies/mL. A negative result does not preclude SARS-Cov-2 infection and should not be used as the sole basis for treatment or other patient management decisions. A negative result may occur with  improper specimen  collection/handling, submission of specimen other than nasopharyngeal swab, presence of viral mutation(s) within the areas targeted by this assay, and inadequate number of viral copies(<138 copies/mL). A negative result must be combined with clinical observations, patient history, and epidemiological information. The expected result is Negative.  Fact Sheet for Patients:  EntrepreneurPulse.com.au  Fact Sheet for Healthcare Providers:  IncredibleEmployment.be  This test is no t yet approved or cleared by the [place] FDA and  has been authorized for detection and/or diagnosis of SARS-CoV-2 by FDA under an Emergency Use Authorization (EUA). This EUA will remain  in effect (meaning this test can be used) for the duration of the COVID-19 declaration under Section 564(b)(1) of the Act, 21 U.S.C.section 360bbb-3(b)(1), unless the authorization is terminated  or revoked sooner.       Influenza A by PCR NEGATIVE NEGATIVE Final   Influenza B by PCR NEGATIVE NEGATIVE Final    Comment: (NOTE) The Xpert Xpress SARS-CoV-2/FLU/RSV plus assay is intended as an aid in the diagnosis of influenza from Nasopharyngeal swab specimens and should not be used as a sole basis for treatment. Nasal washings and aspirates are unacceptable for Xpert Xpress SARS-CoV-2/FLU/RSV testing.  Fact Sheet for Patients: EntrepreneurPulse.com.au  Fact Sheet for Healthcare Providers: IncredibleEmployment.be  This test is not yet approved or cleared by the [place] FDA and has been authorized for detection and/or diagnosis of SARS-CoV-2 by FDA under an Emergency Use Authorization (EUA). This EUA will remain in effect (meaning this test can be used) for the duration of the COVID-19 declaration under Section 564(b)(1) of the Act, 21 U.S.C. section 360bbb-3(b)(1), unless the authorization is terminated or revoked.  Performed at [facility], [place] [address], [place], [place] [address]          Radiology Studies: CT Abdomen Pelvis W Contrast  Result Date: 03/27/2021 CLINICAL DATA:  Metastatic esophageal cancer. Left lower quadrant pain and nausea. EXAM: CT ABDOMEN AND PELVIS WITH CONTRAST TECHNIQUE: Multidetector CT imaging of the abdomen and pelvis was performed using the standard protocol following bolus administration of intravenous contrast. CONTRAST:  128mL OMNIPAQUE IOHEXOL 350 MG/ML SOLN COMPARISON:  CT abdomen pelvis 01/22/2021 FINDINGS: Lower chest: Tiny anterior pericardial effusion, similar to prior chest CT. Hepatobiliary: Small low-attenuation lesion again noted in the patent dome. There is also a rim enhancing mass adjacent to the gallbladder fossa in the anterior liver, with surrounding increased sclerosis. There is no new focal liver lesion identified. Hepatic steatosis. There is new mild pericholecystic fat stranding. Which is nonspecific. No gallstones identified. The Pancreas: Stable 3.0 cm cystic mass in the pancreatic head. Diffuse fatty atrophy of the pancreas. Spleen: Normal in size without focal abnormality. Adrenals/Urinary Tract: Adrenal glands are unremarkable. Kidneys are normal, without renal calculi, focal lesion, or hydronephrosis. Bladder is  unremarkable. Stomach/Bowel: Stomach is within normal limits. There is diffuse bowel wall thickening throughout the entire colon. No dilation to suggest obstruction. Small bowel is unremarkable. Vascular/Lymphatic: Aortic atherosclerosis. No enlarged abdominal or pelvic lymph nodes. Reproductive: Prostate is borderline enlarged. Other: No abdominal wall hernia or abnormality. No abdominopelvic ascites. Musculoskeletal: Stable lucent lesion in the right hemipelvis. Multilevel degenerative disc disease in the lumbar spine. No acute finding. IMPRESSION: 1. There is diffuse bowel wall thickening throughout the entire colon, consistent with colitis,  which may be infectious or inflammatory in etiology. 2. There is new mild pericholecystic fat stranding which is nonspecific. No gallstones identified. Further evaluation with right upper quadrant ultrasound may be considered. 3. Stable 3.0 cm cystic mass in the pancreatic head. 4. Hepatic steatosis. Multiple liver lesions, better characterized on recent MRI. Aortic Atherosclerosis (ICD10-I70.0). Electronically Signed   By: Audie Pinto M.D.   On: 03/07/2021 12:05   DG CHEST PORT 1 VIEW  Result Date: 03/18/2021 CLINICAL DATA:  Metastatic esophageal cancer. Shortness of breath, fever, fatigue. EXAM: PORTABLE CHEST 1 VIEW COMPARISON:  11/30/2020 FINDINGS: Right Port-A-Cath is in place with the tip in the SVC. Heart is normal size. No confluent opacities or effusions. No acute bony abnormality. IMPRESSION: No active disease. Electronically Signed   By: Rolm Baptise M.D.   On: 03/18/2021 16:01        Scheduled Meds:  arformoterol  15 mcg Nebulization BID   And   umeclidinium bromide  1 puff Inhalation Daily   aspirin EC  81 mg Oral Daily   atorvastatin  20 mg Oral QHS   budesonide (PULMICORT) nebulizer solution  0.25 mg Nebulization BID   Chlorhexidine Gluconate Cloth  6 each Topical Daily   dexamethasone  2 mg Oral Daily   Followed by   Derrill Memo ON 03/24/2021] dexamethasone  1 mg Oral Daily   Followed by   Derrill Memo ON 03/31/2021] dexamethasone  0.5 mg Oral Daily   etomidate       fentaNYL       finasteride  5 mg Oral Daily   gabapentin  300 mg Oral TID   insulin aspart  0-20 Units Subcutaneous TID WC   insulin aspart  0-5 Units Subcutaneous QHS   insulin glargine-yfgn  15 Units Subcutaneous Daily   ipratropium-albuterol  3 mL Nebulization Q6H   metoprolol tartrate  50 mg Oral BID   midazolam       multivitamin with minerals  1 tablet Oral Daily   phenylephrine       rocuronium bromide       tamsulosin  0.4 mg Oral Daily   Tbo-filgastrim (GRANIX) SQ  480 mcg Subcutaneous q1800    vancomycin  125 mg Oral QID   Continuous Infusions:  sodium chloride     metronidazole     norepinephrine (LEVOPHED) Adult infusion 10 mcg/min (03/19/21 1133)   sodium chloride       LOS: 1 day    Critical care procedure note Authorized and performed by: Kathie Dike Total critical care time: Approximately 40 minutes Due to high probability of clinically significant, life-threatening deterioration, the patient required my highest level of preparedness to intervene emergently and I personally spent this critical care time directly and personally managing the patient.  The critical care time included obtaining a history, examining the patient, pulse oximetry, ordering and review of studies, arranging urgent treatment with development of a management plan, evaluation of patient's response to treatment, frequent reassessment, discussions with other providers.  Critical  care time was performed to assess and manage the high probability of imminent, life-threatening deterioration that could result in multiorgan failure.  It was exclusive of separate billable procedures and treating other patients and teaching time.  Please see MDM section and the rest of the of note for further information on patient assessment and treatment     Kathie Dike, MD [facility]   If 7PM-7AM, please contact night-coverage [email or web address]  03/19/2021, 11:38 AM

## 2021-03-19 NOTE — TOC Benefit Eligibility Note (Signed)
Patient Advocate Encounter  Prior Authorization for Vancomycin 125 mg capsule has been approved.    PA# [id number] Effective dates: 03/19/2021 through 04/02/2021  Patients co-pay is $56.87.     Lyndel Safe, CPhT Pharmacy Patient Advocate Specialist [facility] Antimicrobial Stewardship Team Direct Number: [phone]  Fax: [phone]

## 2021-03-19 NOTE — Procedures (Signed)
Arterial Catheter Insertion Procedure Note  Estil Vallee  [id number]  04/21/1951  Date:03/19/21  Time:12:56 PM    Provider Performing: Otelia Sergeant    Procedure: Insertion of Arterial Line [phone]) without US guidance  Indication(s) Blood pressure monitoring and/or need for frequent ABGs  Consent Risks of the procedure as well as the alternatives and risks of each were explained to the patient and/or caregiver.  Consent for the procedure was obtained and is signed in the bedside chart  Anesthesia None   Time Out Verified patient identification, verified procedure, site/side was marked, verified correct patient position, special equipment/implants available, medications/allergies/relevant history reviewed, required imaging and test results available.   Sterile Technique Maximal sterile technique including full sterile barrier drape, hand hygiene, sterile gown, sterile gloves, mask, hair covering, sterile ultrasound probe cover (if used).   Procedure Description Area of catheter insertion was cleaned with chlorhexidine and draped in sterile fashion. Without real-time ultrasound guidance an arterial catheter was placed into the right radial artery.  Appropriate arterial tracings confirmed on monitor.     Complications/Tolerance None; patient tolerated the procedure well.   EBL Minimal   Specimen(s) None

## 2021-03-19 NOTE — Progress Notes (Signed)
[facility] Progress Note Patient Name: Jason Snyder DOB: 1950-08-11 MRN: [id number]   Date of Service  03/19/2021  HPI/Events of Note  Notified of elevated lactate at 5.9, and troponin 668.  Pt with severe sepsis with shock, C diff colitis, neutropenic fever and stage IV esophageal CA with bran and liver mets on chemotherapy.   Pt is currently on 3 pressors and is getting LR bolus right now.  Pt is also getting IV steroids and empiric antibiotics and PO vancomycin for c diff.   eICU Interventions  Continue to trend lactate and troponin.  Last pH 7.258.      Intervention Category Intermediate Interventions: Other:  Elsie Lincoln 03/19/2021, 7:55 PM

## 2021-03-19 NOTE — Progress Notes (Signed)
Date and time results received: 03/19/21 1950 (use smartphrase ".now" to insert current time)  Test: Troponin & Lactic  Critical Value: Troponin- 668, Lactic-5.9  Name of Provider Notified: James Ivanoff MD  Orders Received? Or Actions Taken?: Continue to trend both labs

## 2021-03-19 NOTE — Progress Notes (Addendum)
PHARMACY NOTE -  Cefepime  Pharmacy has been assisting with dosing of cefepime for Febrile Neutropenia. Dosage remains stable at 2g IV q24 hr and further renal adjustments per institutional Pharmacy antibiotic protocol  Pharmacy will sign off, following peripherally for culture results or dose adjustments. Please reconsult if a change in clinical status warrants re-evaluation of dosage.  Reuel Boom, PharmD, BCPS [phone] 03/19/2021, 1:51 PM

## 2021-03-19 NOTE — Significant Event (Signed)
Rapid Response Event Note   Reason for Call :  Evaluate transfer status to ICU  Initial Focused Assessment:  On arrival patient in bed and lethargic, responding to sternal rub. RT at bedside with patient and bedside nurse. Patient able to weakly squeeze hands but not answering questions. Patient BP intially 120s, but suddenly dropped to maintain systolic of 78G. Heart rate ST 130s. Patient moved to ICU with RT Neuro- Lethargic, unable to answer questions, but will squeeze hands.  Cardiac- ST 956O, BP systolic 13Y. Respiratory- Tachypnea RR 40s, SpO2 97% 15L NRB, lungs   Interventions:  Memon MD at bedside, patient transferred to ICU and intubated by Erin Fulling MD.       Josph Macho, RN

## 2021-03-19 NOTE — Progress Notes (Signed)
Date and time results received: 03/19/21 0800 (use smartphrase ".now" to insert current time)  Test: abs Critical Value: cbg 449,   absolute neutrophil 0.0  Name of Provider Notified: Levy Sjogren ,MD  Orders Received? Or Actions Taken?: YES

## 2021-03-20 ENCOUNTER — Inpatient Hospital Stay (HOSPITAL_COMMUNITY): Payer: Medicare Other

## 2021-03-20 ENCOUNTER — Other Ambulatory Visit (HOSPITAL_COMMUNITY): Payer: Medicare Other

## 2021-03-20 DIAGNOSIS — R259 Unspecified abnormal involuntary movements: Secondary | ICD-10-CM

## 2021-03-20 DIAGNOSIS — J9601 Acute respiratory failure with hypoxia: Secondary | ICD-10-CM | POA: Diagnosis not present

## 2021-03-20 DIAGNOSIS — N179 Acute kidney failure, unspecified: Secondary | ICD-10-CM | POA: Diagnosis not present

## 2021-03-20 DIAGNOSIS — A419 Sepsis, unspecified organism: Secondary | ICD-10-CM | POA: Diagnosis not present

## 2021-03-20 DIAGNOSIS — R569 Unspecified convulsions: Secondary | ICD-10-CM

## 2021-03-20 DIAGNOSIS — A0472 Enterocolitis due to Clostridium difficile, not specified as recurrent: Secondary | ICD-10-CM | POA: Diagnosis not present

## 2021-03-20 LAB — COMPREHENSIVE METABOLIC PANEL
ALT: 271 U/L — ABNORMAL HIGH (ref 0–44)
AST: 406 U/L — ABNORMAL HIGH (ref 15–41)
Albumin: 1.8 g/dL — ABNORMAL LOW (ref 3.5–5.0)
Alkaline Phosphatase: 57 U/L (ref 38–126)
Anion gap: 13 (ref 5–15)
BUN: 39 mg/dL — ABNORMAL HIGH (ref 8–23)
CO2: 12 mmol/L — ABNORMAL LOW (ref 22–32)
Calcium: 7.3 mg/dL — ABNORMAL LOW (ref 8.9–10.3)
Chloride: 109 mmol/L (ref 98–111)
Creatinine, Ser: 4.79 mg/dL — ABNORMAL HIGH (ref 0.61–1.24)
GFR, Estimated: 13 mL/min — ABNORMAL LOW (ref >60)
Glucose, Bld: 135 mg/dL — ABNORMAL HIGH (ref 70–99)
Potassium: 5.6 mmol/L — ABNORMAL HIGH (ref 3.5–5.1)
Sodium: 134 mmol/L — ABNORMAL LOW (ref 135–145)
Total Bilirubin: 1.8 mg/dL — ABNORMAL HIGH (ref 0.3–1.2)
Total Protein: 4.1 g/dL — ABNORMAL LOW (ref 6.5–8.1)

## 2021-03-20 LAB — BLOOD CULTURE ID PANEL (REFLEXED) - BCID2

## 2021-03-20 LAB — BLOOD GAS, ARTERIAL
Acid-base deficit: 15.6 mmol/L — ABNORMAL HIGH (ref 0.0–2.0)
Bicarbonate: 12.8 mmol/L — ABNORMAL LOW (ref 20.0–28.0)
FIO2: 70
MECHVT: 540 mL
O2 Saturation: 92.3 %
PEEP: 5 cmH2O
Patient temperature: 105.2
RATE: 20 resp/min
pCO2 arterial: 43.7 mmHg (ref 32.0–48.0)
pH, Arterial: 7.121 — CL (ref 7.350–7.450)
pO2, Arterial: 99.1 mmHg (ref 83.0–108.0)

## 2021-03-20 LAB — LACTIC ACID, PLASMA
Lactic Acid, Venous: 5.1 mmol/L (ref 0.5–1.9)
Lactic Acid, Venous: 6 mmol/L (ref 0.5–1.9)

## 2021-03-20 LAB — CBC
HCT: 54.9 % — ABNORMAL HIGH (ref 39.0–52.0)
Hemoglobin: 18.3 g/dL — ABNORMAL HIGH (ref 13.0–17.0)
MCH: 31.8 pg (ref 26.0–34.0)
MCHC: 33.3 g/dL (ref 30.0–36.0)
MCV: 95.3 fL (ref 80.0–100.0)
Platelets: 46 10*3/uL — ABNORMAL LOW (ref 150–400)
RBC: 5.76 MIL/uL (ref 4.22–5.81)
RDW: 16.9 % — ABNORMAL HIGH (ref 11.5–15.5)
WBC: 0.8 10*3/uL — CL (ref 4.0–10.5)
nRBC: 10.7 % — ABNORMAL HIGH (ref 0.0–0.2)

## 2021-03-20 LAB — BASIC METABOLIC PANEL
Anion gap: 11 (ref 5–15)
BUN: 36 mg/dL — ABNORMAL HIGH (ref 8–23)
CO2: 15 mmol/L — ABNORMAL LOW (ref 22–32)
Calcium: 7.2 mg/dL — ABNORMAL LOW (ref 8.9–10.3)
Chloride: 106 mmol/L (ref 98–111)
Creatinine, Ser: 4.19 mg/dL — ABNORMAL HIGH (ref 0.61–1.24)
GFR, Estimated: 15 mL/min — ABNORMAL LOW (ref >60)
Glucose, Bld: 153 mg/dL — ABNORMAL HIGH (ref 70–99)
Potassium: 5.1 mmol/L (ref 3.5–5.1)
Sodium: 132 mmol/L — ABNORMAL LOW (ref 135–145)

## 2021-03-20 LAB — MAGNESIUM: Magnesium: 3.2 mg/dL — ABNORMAL HIGH (ref 1.7–2.4)

## 2021-03-20 LAB — GLUCOSE, CAPILLARY
Glucose-Capillary: 90 mg/dL (ref 70–99)
Glucose-Capillary: 71 mg/dL (ref 70–99)
Glucose-Capillary: 116 mg/dL — ABNORMAL HIGH (ref 70–99)

## 2021-03-20 LAB — PHOSPHORUS: Phosphorus: 5.5 mg/dL — ABNORMAL HIGH (ref 2.5–4.6)

## 2021-03-20 MED ORDER — SODIUM POLYSTYRENE SULFONATE 15 GM/60ML PO SUSP
30.0000 g | Freq: Once | ORAL | Status: AC
  Administered 2021-03-20: 30 g
  Filled 2021-03-20: qty 120

## 2021-03-20 MED ORDER — SODIUM CHLORIDE 0.9 % IV SOLN
1.0000 g | Freq: Every day | INTRAVENOUS | Status: DC
  Filled 2021-03-20: qty 1

## 2021-03-20 MED ORDER — EPINEPHRINE HCL 5 MG/250ML IV SOLN IN NS
0.5000 ug/min | INTRAVENOUS | Status: DC
  Administered 2021-03-20: 19.5 ug/min via INTRAVENOUS
  Administered 2021-03-20: 0.5 ug/min via INTRAVENOUS
  Filled 2021-03-20 (×2): qty 250

## 2021-03-20 MED ORDER — STERILE WATER FOR INJECTION IV SOLN
INTRAVENOUS | Status: DC
  Filled 2021-03-20: qty 1000
  Filled 2021-03-20: qty 150

## 2021-03-21 ENCOUNTER — Other Ambulatory Visit (HOSPITAL_COMMUNITY): Payer: Self-pay

## 2021-03-21 NOTE — Telephone Encounter (Signed)
Open for chart review no changes made. 

## 2021-03-22 LAB — CULTURE, BLOOD (ROUTINE X 2): Special Requests: ADEQUATE

## 2021-03-23 LAB — CULTURE, RESPIRATORY W GRAM STAIN

## 2021-03-29 ENCOUNTER — Other Ambulatory Visit: Payer: Medicare Other

## 2021-03-29 ENCOUNTER — Ambulatory Visit: Payer: Medicare Other

## 2021-03-29 ENCOUNTER — Ambulatory Visit: Payer: Medicare Other | Admitting: Hematology

## 2021-03-31 DIAGNOSIS — E8729 Other acidosis: Secondary | ICD-10-CM | POA: Diagnosis present

## 2021-03-31 DIAGNOSIS — K7200 Acute and subacute hepatic failure without coma: Secondary | ICD-10-CM | POA: Diagnosis present

## 2021-03-31 DIAGNOSIS — J189 Pneumonia, unspecified organism: Secondary | ICD-10-CM | POA: Diagnosis not present

## 2021-03-31 DIAGNOSIS — E871 Hypo-osmolality and hyponatremia: Secondary | ICD-10-CM | POA: Diagnosis present

## 2021-03-31 DIAGNOSIS — D696 Thrombocytopenia, unspecified: Secondary | ICD-10-CM | POA: Diagnosis present

## 2021-03-31 DIAGNOSIS — R7881 Bacteremia: Secondary | ICD-10-CM | POA: Diagnosis not present

## 2021-03-31 DIAGNOSIS — E872 Acidosis, unspecified: Secondary | ICD-10-CM | POA: Diagnosis present

## 2021-03-31 DIAGNOSIS — B965 Pseudomonas (aeruginosa) (mallei) (pseudomallei) as the cause of diseases classified elsewhere: Secondary | ICD-10-CM | POA: Diagnosis not present

## 2021-04-03 NOTE — Progress Notes (Signed)
                                                                                                                                                             Patient Name: Jason Snyder MRN: [id number] DOB: 03-07-1951 Referring Physician: Billey Chang (Profile Not Attached) Date of Service: 02/09/2021 [facility], [place]                                                        End Of Treatment Note  Diagnoses: C79.31-Secondary malignant neoplasm of brain  Cancer Staging: Cancer Staging Esophageal cancer [facility]) Staging form: Esophagus - Adenocarcinoma, AJCC 8th Edition - Clinical stage from 01/26/2021: Stage IVB (cTX, cNX, pM1) - Signed by Truitt Merle, MD on 02/02/2021 Stage prefix: Initial diagnosis   Intent: Palliative  Radiation Treatment Dates: 02/09/2021 through 02/09/2021 Site Technique Total Dose (Gy) Dose per Fx (Gy) Completed Fx Beam Energies  Brain: Brain_SRS IMRT 18/18 18 1/1 6XFFF   Narrative: The patient tolerated radiation therapy relatively well.   Plan: The patient will follow-up with radiation oncology in 75mo.  -----------------------------------  Eppie Gibson, MD

## 2021-04-03 NOTE — Progress Notes (Signed)
PHARMACY - PHYSICIAN COMMUNICATION CRITICAL VALUE ALERT - BLOOD CULTURE IDENTIFICATION (BCID)  Jason Snyder is an 70 y.o. male who presented to [facility] on 03/27/2021 with a chief complaint of neutropenic fever and c diff  Assessment:  GNR in 2/2 BCx: BCID = Pseudomonas aeruginosa  Name of physician (or Provider) Contacted: Dr. Lamonte Sakai  Current antibiotics: cefepime/oral vancomycin/flagyl  Changes to prescribed antibiotics recommended:  Patient is on recommended antibiotics - No changes needed  Results for orders placed or performed during the hospital encounter of 03/06/2021  Blood Culture ID Panel (Reflexed) (Collected: 03/19/2021  3:19 PM)  Result Value Ref Range   Enterococcus faecalis NOT DETECTED NOT DETECTED   Enterococcus Faecium NOT DETECTED NOT DETECTED   Listeria monocytogenes NOT DETECTED NOT DETECTED   Staphylococcus species NOT DETECTED NOT DETECTED   Staphylococcus aureus (BCID) NOT DETECTED NOT DETECTED   Staphylococcus epidermidis NOT DETECTED NOT DETECTED   Staphylococcus lugdunensis NOT DETECTED NOT DETECTED   Streptococcus species NOT DETECTED NOT DETECTED   Streptococcus agalactiae NOT DETECTED NOT DETECTED   Streptococcus pneumoniae NOT DETECTED NOT DETECTED   Streptococcus pyogenes NOT DETECTED NOT DETECTED   A.calcoaceticus-baumannii NOT DETECTED NOT DETECTED   Bacteroides fragilis NOT DETECTED NOT DETECTED   Enterobacterales NOT DETECTED NOT DETECTED   Enterobacter cloacae complex NOT DETECTED NOT DETECTED   Escherichia coli NOT DETECTED NOT DETECTED   Klebsiella aerogenes NOT DETECTED NOT DETECTED   Klebsiella oxytoca NOT DETECTED NOT DETECTED   Klebsiella pneumoniae NOT DETECTED NOT DETECTED   Proteus species NOT DETECTED NOT DETECTED   Salmonella species NOT DETECTED NOT DETECTED   Serratia marcescens NOT DETECTED NOT DETECTED   Haemophilus influenzae NOT DETECTED NOT DETECTED   Neisseria meningitidis NOT DETECTED NOT DETECTED   Pseudomonas  aeruginosa DETECTED (A) NOT DETECTED   Stenotrophomonas maltophilia NOT DETECTED NOT DETECTED   Candida albicans NOT DETECTED NOT DETECTED   Candida auris NOT DETECTED NOT DETECTED   Candida glabrata NOT DETECTED NOT DETECTED   Candida krusei NOT DETECTED NOT DETECTED   Candida parapsilosis NOT DETECTED NOT DETECTED   Candida tropicalis NOT DETECTED NOT DETECTED   Cryptococcus neoformans/gattii NOT DETECTED NOT DETECTED   CTX-M ESBL NOT DETECTED NOT DETECTED   Carbapenem resistance IMP NOT DETECTED NOT DETECTED   Carbapenem resistance KPC NOT DETECTED NOT DETECTED   Carbapenem resistance NDM NOT DETECTED NOT DETECTED   Carbapenem resistance VIM NOT DETECTED NOT DETECTED    [name] March 29, 2021  9:03 AM

## 2021-04-03 NOTE — Procedures (Signed)
Patient Name: Jason Snyder  MRN: [id number]  Epilepsy Attending: Lora Havens  Referring Physician/Provider: Dr Marcha Dutton Date: 2021-03-21  Duration: 03/21/2021 1209 to 0346  Patient history:70 yo M noted to have rhythmic movement paeri-orally. Rapid ceribell eeg to evaluate for seizure  Level of alertness: comatose  AEDs during EEG study: None  Technical aspects: This EEG study was done with scalp electrodes positioned according to the 10-20 International system of electrode placement. Electrical activity was acquired at a sampling rate of 500Hz  and reviewed with a high frequency filter of 70Hz  and a low frequency filter of 1Hz . EEG data were recorded continuously and digitally stored.   Description: EEG showed continuous generalized low amplitude 3-5hz  theta-delta slowing. Hyperventilation and photic stimulation were not performed.     ABNORMALITY - Continuous slow, generalized  IMPRESSION: This limited ceribell eeg is  suggestive of severe diffuse encephalopathy, nonspecific etiology. No seizures or epileptiform discharges were seen throughout the recording.   Of note, focal motor seizures may not been on scalp eeg. If suspicion for interictal -ictal activity remains a concern, a traditional EEG can be considered.   Jason Snyder    NOTE: delayed report as reading physician was not notified of study being performed. Incident report filed.

## 2021-04-03 NOTE — Death Summary Note (Addendum)
DEATH SUMMARY   Patient Details  Name: Jason Snyder MRN: [id number] DOB: 03-30-1951  Admission/Discharge Information   Admit Date:  04/10/2021  Date of Death: Date of Death: 13-Apr-2021  Time of Death: Time of Death: 09-11-1023  Length of Stay: 2  Referring Physician: [name], MD   Reason(s) for Hospitalization    Diagnoses  Preliminary cause of death:  Septic Shock due to C difficile colitis and Pseudomonas bacteremia  Secondary Diagnoses (including complications and co-morbidities):  Principal Problem:   Septic shock ([place]) Active Problems:   Obesity (BMI 30.0-34.9)   Chronic pain   Type 2 diabetes mellitus with peripheral neuropathy (HCC)   COPD (chronic obstructive pulmonary disease) (HCC)   Essential hypertension   OSA on CPAP   Coronary artery disease involving native coronary artery of native heart without angina pectoris   Shock liver   Hyponatremia   High anion gap metabolic acidosis   Normal anion gap metabolic acidosis   Thrombocytopenia (HCC)   Healthcare-associated pneumonia   Brain metastases (HCC)   Esophageal cancer (HCC)   C. difficile colitis   Neutropenic fever ([place])   AKI (acute kidney injury) ([place])   Acute respiratory failure with hypoxia ([place])   Bacteremia due to Pseudomonas Diabetes mellitus with hyperglycemia  Brief Hospital Course (including significant findings, care, treatment, and services provided and events leading to death)  Jason Snyder is a 70 y.o. 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  He presented to ER after symptoms continued.   He was afebrile and hemodynamically stable on admit.  Found to be Cdiff positive with CT abd/ pelvis which showed thickening of the colon wall consistent  with C difficile colitis.  He was started on oral vancomycin and admitted to [facility].  Oncology following, developed neutropenic fever and started on Granix.  On 10/17, he developed worsening respiratory failure and new AKI overnight, transferred to ICU for AMS, progressive hypotension, and worsening hypoxia.  He required intubation mechanical ventilation, initiation of vasopressors.  Enteral vancomycin was continued, IV Flagyl added.  He is also covered with cefepime in case there was an alternative infectious process contributing to his decompensation.  Tracheal aspirate subsequently grew out Klebsiella and Pseudomonas.  He had 2/2 blood cultures positive for Pseudomonas drawn 10/17.  Despite this care he remained febrile, continued to have progressive shock with vasopressors uptitrated.  Empiric stress dose steroids were started.  He had progressive acute renal failure and developed anuria, anion gap and non-anion gap metabolic acidoses.  Bicarbonate infusion was initiated.  Course further complicated by shock liver with transaminitis and mild coagulopathy.  He also developed thrombocytopenia associated with sepsis.  Based on his acute decline, high pressor needs and his underlying malignancy was felt that CVVHD would not be tolerated or ultimately beneficial.  Unfortunately despite on maximum support the patient continued to have progressive shock. He failed to stabilize and expired on 04-13-2021.      Pertinent Labs and Studies  Significant Diagnostic Studies DG Abd 1 View  Result Date: 03/19/2021 CLINICAL DATA:  Feeding tube placement EXAM: ABDOMEN - 1 VIEW COMPARISON:  2021-04-10 FINDINGS: Limited radiograph of the lower chest and upper abdomen was obtained for the purposes of enteric tube localization. Enteric tube is seen coursing below  the diaphragm with distal tip and side port coiled within the gastric fundus. IMPRESSION: Enteric tube coiled within the gastric fundus. Electronically Signed    By: Davina Poke D.O.   On: 03/19/2021 13:06   MR BRAIN W WO CONTRAST  Result Date: 03/10/2021 CLINICAL DATA:  Brain/CNS neoplasm, assess treatment response. Metastatic esophageal cancer. SRS to left parietal and occipital lesions on 02/22/2021. EXAM: MRI HEAD WITHOUT AND WITH CONTRAST TECHNIQUE: Multiplanar, multiecho pulse sequences of the brain and surrounding structures were obtained without and with intravenous contrast. CONTRAST:  59mL MULTIHANCE GADOBENATE DIMEGLUMINE 529 MG/ML IV SOLN Contrast was administered via a port which was accessed by a Equities trader. COMPARISON:  01/31/2021 FINDINGS: Brain: New lesions: None. Larger lesions: None. Stable or smaller lesions: 1. 13 mm left parietal metastasis, decreased in size (series 11, image 114, previously 21 mm). 2. 2 mm left occipital metastasis, unchanged (series 11, image 71). Other brain findings: There is moderate vasogenic edema in the left parietal lobe which has mildly decreased from the prior MRI. There is mild regional mass effect without midline shift. Small T2 hyperintensities elsewhere in the cerebral white matter bilaterally are nonspecific but compatible with mild chronic small vessel ischemic disease. The ventricles are normal in size. No acute infarct, intracranial hemorrhage, or extra-axial fluid collection is evident. Vascular: Major intracranial vascular flow voids are preserved. Skull and upper cervical spine: Unremarkable bone marrow signal. Sinuses/Orbits: Unremarkable orbits. Chronic opacification of a small air cell supero laterally in the right frontal sinus. Clear mastoid air cells. Other: None. IMPRESSION: 1. Decreased size of left parietal metastasis with decreased edema. 2. Unchanged punctate left occipital metastasis. 3. No evidence of new intracranial metastases. Electronically Signed   By: Logan Bores M.D.   On: 03/10/2021 11:41   CT Abdomen Pelvis W Contrast  Result Date: 03/25/2021 CLINICAL DATA:  Metastatic  esophageal cancer. Left lower quadrant pain and nausea. EXAM: CT ABDOMEN AND PELVIS WITH CONTRAST TECHNIQUE: Multidetector CT imaging of the abdomen and pelvis was performed using the standard protocol following bolus administration of intravenous contrast. CONTRAST:  168mL OMNIPAQUE IOHEXOL 350 MG/ML SOLN COMPARISON:  CT abdomen pelvis 01/22/2021 FINDINGS: Lower chest: Tiny anterior pericardial effusion, similar to prior chest CT. Hepatobiliary: Small low-attenuation lesion again noted in the patent dome. There is also a rim enhancing mass adjacent to the gallbladder fossa in the anterior liver, with surrounding increased sclerosis. There is no new focal liver lesion identified. Hepatic steatosis. There is new mild pericholecystic fat stranding. Which is nonspecific. No gallstones identified. The Pancreas: Stable 3.0 cm cystic mass in the pancreatic head. Diffuse fatty atrophy of the pancreas. Spleen: Normal in size without focal abnormality. Adrenals/Urinary Tract: Adrenal glands are unremarkable. Kidneys are normal, without renal calculi, focal lesion, or hydronephrosis. Bladder is unremarkable. Stomach/Bowel: Stomach is within normal limits. There is diffuse bowel wall thickening throughout the entire colon. No dilation to suggest obstruction. Small bowel is unremarkable. Vascular/Lymphatic: Aortic atherosclerosis. No enlarged abdominal or pelvic lymph nodes. Reproductive: Prostate is borderline enlarged. Other: No abdominal wall hernia or abnormality. No abdominopelvic ascites. Musculoskeletal: Stable lucent lesion in the right hemipelvis. Multilevel degenerative disc disease in the lumbar spine. No acute finding. IMPRESSION: 1. There is diffuse bowel wall thickening throughout the entire colon, consistent with colitis, which may be infectious or inflammatory in etiology. 2. There is new mild pericholecystic fat stranding which is nonspecific. No gallstones identified. Further evaluation with right upper  quadrant ultrasound may be considered. 3. Stable 3.0 cm cystic mass  in the pancreatic head. 4. Hepatic steatosis. Multiple liver lesions, better characterized on recent MRI. Aortic Atherosclerosis (ICD10-I70.0). Electronically Signed   By: Audie Pinto M.D.   On: 03/07/2021 12:05   US RENAL  Result Date: 03/19/2021 CLINICAL DATA:  Acute kidney injury EXAM: RENAL / URINARY TRACT ULTRASOUND COMPLETE COMPARISON:  CT 03/12/2021 FINDINGS: Right Kidney: Renal measurements: 11.1 x 6.3 x 5.8 cm = volume: 210.7 mL. Echogenicity within normal limits. No mass or hydronephrosis visualized. Left Kidney: Renal measurements: 11 x 6.2 x 5.9 cm = volume: 209 mL. Echogenicity within normal limits. No mass or hydronephrosis visualized. Bladder: Decompressed by Foley catheter Other: None. IMPRESSION: Negative renal ultrasound Electronically Signed   By: Donavan Foil M.D.   On: 03/19/2021 19:21   DG Chest Port 1 View  Result Date: 03/22/2021 CLINICAL DATA:  Respiratory failure EXAM: PORTABLE CHEST 1 VIEW COMPARISON:  Chest radiograph 03/19/2021 FINDINGS: The endotracheal tube tip is in the midthoracic trachea approximately 4.2 cm from the carina. A right chest wall port tip is in the lower SVC/cavoatrial junction. The enteric catheter is coiled in the stomach. The cardiomediastinal silhouette is stable. Lung volumes are low. Mild opacity in the medial right base is unchanged, favored to reflect atelectasis. There is no new focal airspace disease. There is no significant pleural effusion. There is no pneumothorax. The bones are stable. IMPRESSION: Stable probable mild atelectasis in the medial right base. No new or worsening airspace disease. Electronically Signed   By: Valetta Mole M.D.   On: 2021/03/22 08:29   DG Chest Port 1 View  Result Date: 03/19/2021 CLINICAL DATA:  Intubation. EXAM: PORTABLE CHEST 1 VIEW COMPARISON:  03/19/2021 at 11:10 a.m. FINDINGS: Endotracheal tube has been placed and terminates at the  inferior aspect of the clavicular heads, well above the carina. An enteric tube has been placed and loops over the proximal stomach. A right jugular Port-A-Cath remains in place, terminating over the lower SVC. The cardiomediastinal silhouette is unchanged with normal heart size. Lung volumes are low with increased, mild opacity in both lung bases. No sizable pleural effusion or pneumothorax is identified. IMPRESSION: 1. Support devices as above. 2. Low lung volumes with bibasilar atelectasis. Electronically Signed   By: Logan Bores M.D.   On: 03/19/2021 12:48   DG CHEST PORT 1 VIEW  Result Date: 03/19/2021 CLINICAL DATA:  Esophageal cancer with altered consciousness. EXAM: PORTABLE CHEST 1 VIEW COMPARISON:  03/18/2021 FINDINGS: Reverse apical lordotic positioning. Right Port-A-Cath tip at low SVC. Patient rotated right. Normal heart size. No pleural effusion or pneumothorax. Clear left lung. Increased density over the right upper lung is favored to be due to patient obliquity and osseous summation. IMPRESSION: No acute cardiopulmonary disease. Electronically Signed   By: Abigail Miyamoto M.D.   On: 03/19/2021 11:50   DG CHEST PORT 1 VIEW  Result Date: 03/18/2021 CLINICAL DATA:  Metastatic esophageal cancer. Shortness of breath, fever, fatigue. EXAM: PORTABLE CHEST 1 VIEW COMPARISON:  11/30/2020 FINDINGS: Right Port-A-Cath is in place with the tip in the SVC. Heart is normal size. No confluent opacities or effusions. No acute bony abnormality. IMPRESSION: No active disease. Electronically Signed   By: Rolm Baptise M.D.   On: 03/18/2021 16:01   VAS [place] LOWER EXTREMITY VENOUS (DVT)  Result Date: 03/12/2021  Lower Venous DVT Study Patient Name:  Jason Snyder  Date of Exam:   03/12/2021 Medical Rec #: [id number]    Accession #:    [id number] Date of Birth: 09/20/1950  Patient Gender: M Patient Age:   14 years Exam Location:  [facility] Procedure:      VAS [place] LOWER EXTREMITY VENOUS (DVT) Referring  Phys: Regan Rakers [place] --------------------------------------------------------------------------------  Indications: Edema, and Pain.  Risk Factors: Metastatic CA on chemotherapy. Comparison Study: Previous exam 01/02/2021 - negative for DVT Performing Technologist: Jody Hill RVT, RDMS  Examination Guidelines: A complete evaluation includes B-mode imaging, spectral Doppler, color Doppler, and power Doppler as needed of all accessible portions of each vessel. Bilateral testing is considered an integral part of a complete examination. Limited examinations for reoccurring indications may be performed as noted. The reflux portion of the exam is performed with the patient in reverse Trendelenburg.  +---------+---------------+---------+-----------+----------+--------------+ RIGHT    CompressibilityPhasicitySpontaneityPropertiesThrombus Aging +---------+---------------+---------+-----------+----------+--------------+ CFV      Full           Yes      Yes                                 +---------+---------------+---------+-----------+----------+--------------+ SFJ      Full                                                        +---------+---------------+---------+-----------+----------+--------------+ FV Prox  Full           Yes      Yes                                 +---------+---------------+---------+-----------+----------+--------------+ FV Mid   Full           Yes      Yes                                 +---------+---------------+---------+-----------+----------+--------------+ FV DistalFull           Yes      Yes                                 +---------+---------------+---------+-----------+----------+--------------+ PFV      Full                                                        +---------+---------------+---------+-----------+----------+--------------+ POP      Full           Yes      Yes                                  +---------+---------------+---------+-----------+----------+--------------+ PTV      Full                                                        +---------+---------------+---------+-----------+----------+--------------+ PERO     Full                                                        +---------+---------------+---------+-----------+----------+--------------+   +---------+---------------+---------+-----------+----------+--------------+  LEFT     CompressibilityPhasicitySpontaneityPropertiesThrombus Aging +---------+---------------+---------+-----------+----------+--------------+ CFV      Full           Yes      Yes                                 +---------+---------------+---------+-----------+----------+--------------+ SFJ      Full                                                        +---------+---------------+---------+-----------+----------+--------------+ FV Prox  Full           Yes      Yes                                 +---------+---------------+---------+-----------+----------+--------------+ FV Mid   Full           Yes      Yes                                 +---------+---------------+---------+-----------+----------+--------------+ FV DistalFull           Yes      Yes                                 +---------+---------------+---------+-----------+----------+--------------+ PFV      Full                                                        +---------+---------------+---------+-----------+----------+--------------+ POP      Full           Yes      Yes                                 +---------+---------------+---------+-----------+----------+--------------+ PTV      Full                                                        +---------+---------------+---------+-----------+----------+--------------+ PERO     Full                                                         +---------+---------------+---------+-----------+----------+--------------+     Summary: BILATERAL: - No evidence of deep vein thrombosis seen in the lower extremities, bilaterally. - No evidence of superficial venous thrombosis in the lower extremities, bilaterally. -No evidence of popliteal cyst, bilaterally.   *See table(s) above for measurements and observations. Electronically signed by Monica Martinez MD on 03/12/2021 at 4:31:33 PM.    Final  Rose Fillers Kaelene Elliston 03/31/2021, 2:13 PM

## 2021-04-03 NOTE — TOC Initial Note (Signed)
Transition of Care [facility]) - Initial/Assessment Note    Patient Details  Name: Jason Snyder MRN: [id number] Date of Birth: [birth year]  Transition of [facility]) CM/SW Contact:    Dessa Phi, RN Phone Number: Apr 10, 2021, 9:55 AM  Clinical Narrative:  Spoke to spouse about d/c plans-Active w/[facility] HHPT/OT rep [facility] following. Noted organ failure;Partial code,vent.                 Expected Discharge Plan:  (TBD) Barriers to Discharge: Continued Medical Work up   Patient Goals and CMS Choice Patient states their goals for this hospitalization and ongoing recovery are:: Home CMS Medicare.gov Compare Post Acute Care list provided to:: Patient Represenative (must comment) ([place]-spouse [phone])    Expected Discharge Plan and Services Expected Discharge Plan:  (TBD)   Discharge Planning Services: CM Consult Post Acute Care Choice:  (TBD) Living arrangements for the past 2 months: Single Family Home                                      Prior Living Arrangements/Services Living arrangements for the past 2 months: Single Family Home Lives with:: Spouse Patient language and need for interpreter reviewed:: Yes Do you feel safe going back to the place where you live?: Yes      Need for Family Participation in Patient Care: No (Comment) Care giver support system in place?: Yes (comment) Current home services: Home PT, Home OT (Active w[facility]/OT) Criminal Activity/Legal Involvement Pertinent to Current Situation/Hospitalization: No - Comment as needed  Activities of Daily Living Home Assistive Devices/Equipment: None ADL Screening (condition at time of admission) Patient's cognitive ability adequate to safely complete daily activities?: Yes Is the patient deaf or have difficulty hearing?: No Does the patient have difficulty seeing, even when wearing glasses/contacts?: No Does the patient have difficulty concentrating, remembering, or making decisions?: No Patient  able to express need for assistance with ADLs?: No Does the patient have difficulty dressing or bathing?: No Independently performs ADLs?: Yes (appropriate for developmental age) Does the patient have difficulty walking or climbing stairs?: No Weakness of Legs: None Weakness of Arms/Hands: None  Permission Sought/Granted Permission sought to share information with : Case Manager Permission granted to share information with : Yes, Verbal Permission Granted  Share Information with NAME: Case Manager     Permission granted to share info w Relationship: Helene Kelp spouse [phone]     Emotional Assessment Appearance:: Appears stated age Attitude/Demeanor/Rapport: Gracious Affect (typically observed): Accepting Orientation: : Oriented to Self Alcohol / Substance Use: Not Applicable Psych Involvement: No (comment)  Admission diagnosis:  C. difficile colitis [A04.72] Patient Active Problem List   Diagnosis Date Noted   Septic shock ([place])    AKI (acute kidney injury) ([place])    Respiratory failure ([place])    Neutropenic fever ([place])    C. difficile colitis 03/29/2021   Mucositis due to antineoplastic therapy 03/05/2021   Port-A-Cath in place 02/13/2021   Brain metastases ([place]) 02/02/2021   Esophageal cancer ([place]) 02/02/2021   Goals of care, counseling/discussion 02/02/2021   Acute esophagitis    Esophageal mass    Esophageal dysphagia    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 structures ([place]) 01/23/2021   Brain mass 01/22/2021   Thoracic and lumbosacral neuritis    Obesity (BMI 30-39.9)    Hypertension    Hyperlipidemia    History of lumbar surgery  Diabetes mellitus without complication ([place])    DDD (degenerative disc disease), lumbar    Chronic low back pain with right-sided sciatica    Chronic left shoulder pain    CAD (coronary artery disease)    BMI 30.0-30.9,adult    Combined hyperlipidemia associated with type 2 diabetes mellitus ([place]) 07/09/2019   Coronary  artery disease involving native coronary artery of native heart without angina pectoris    Encounter for screening for lung cancer 11/09/2018   Rosacea 10/03/2017   Smoking greater than 30 pack years - quit 03/2018 10/03/2017   BPH without obstruction/lower urinary tract symptoms 10/03/2017   Gastroesophageal reflux disease 01/10/2015   Primary osteoarthritis involving multiple joints 01/10/2015   Type 2 diabetes mellitus with peripheral neuropathy ([place]) 07/12/2014   Controlled type 2 diabetes mellitus without complication, without long-term current use of insulin ([place]) 07/12/2014   COPD (chronic obstructive pulmonary disease) ([place]) 01/07/2014   Chronic pain 10/07/2013   OSA on CPAP 10/07/2013   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 [address] [place] [address] Phone: [phone] Fax: [place] [address] Phone: [phone] Fax: [phone]     Social Determinants of Health (SDOH) Interventions    Readmission Risk Interventions Readmission Risk Prevention Plan March 30, 2021  Medication Review ([place]) Complete  PCP or Specialist appointment within 3-5 days of discharge Complete  HRI or [place] Complete  SW Recovery Care/Counseling Consult Complete  Palliative Care Screening Complete  [place] Not Applicable  Some recent data might be hidden

## 2021-04-03 NOTE — Progress Notes (Signed)
PHARMACY NOTE:  ANTIMICROBIAL RENAL DOSAGE ADJUSTMENT  Current antimicrobial regimen includes a mismatch between antimicrobial dosage and estimated renal function.  As per policy approved by the Pharmacy & Therapeutics and Medical Executive Committees, the antimicrobial dosage will be adjusted accordingly.  Current antimicrobial dosage:  cefepime 2 g iv q 24 h  Indication: febrile neutropenia  Renal Function:  Estimated Creatinine Clearance: 18.9 mL/min (A) (by C-G formula based on SCr of 4.79 mg/dL (H)). []      On intermittent HD, scheduled: []      On CRRT Severe AKI GFR not accurate    Antimicrobial dosage has been changed to:  cefepime 1 g iv q 24h  Additional comments:   Thank you for allowing pharmacy to be a part of this patient's care.  Napoleon Form, [facility] 03/27/2021 7:32 AM

## 2021-04-03 NOTE — Progress Notes (Signed)
NAME:  Jason Snyder, MRN:  [id number], DOB:  Aug 31, 1950, LOS: 2 ADMISSION DATE:  03/10/2021, CONSULTATION DATE:  03/19/2021 REFERRING MD:  Dr. Roderic Palau, CHIEF COMPLAINT:  Respiratory distress  History of Present Illness:  HPI obtained from medical chart review as patient is currently intubated and sedated on mechanical ventilation.   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  He presented to ER after symptoms continued.  He was afebrile and hemodynamically stable on admit.  Found to be Cdiff positive with CT abd/ pelvis which showed thickening of the colon wall.  He was started on oral vanc and admitted to [facility].  Oncology following, developed neutropenic fever and started on Granix.  On 10/17, he developed worsening respiratory failure and new AKI overnight, transferred to ICU today given decreased AMS, hypotension, and worsening hypoxia, and required intubation and vasopressor support on arrival.   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 - on chemo/ radiation Port-a-cath, right chest COPD  HTN CAD  Significant Hospital Events: Including procedures, antibiotic start and stop dates in addition to other pertinent events   10/15 admitted with c diff, oral vanc started 10/16 neutropenic fever, started on granix per oncology  10/17 respiratory distress and new AKI, intubated on pressors, flagyl and cefepime added, pan-culture, R radial aline 10/17 required ET intubation and mechanical ventilation 10/17 significant progression of his septic shock, addition multiple pressors 10/17 vancomycin Introl, Flagyl IV, cefepime added empirically (MRSA screen negative)  Interim History / Subjective:  Progressive shock  overnight, currently on epinephrine, norepinephrine, phenylephrine and vasopressin Bicarbonate infusion Has continued to have high fever, 104.9 currently FiO2 0.70, PEEP 5  Objective   Blood pressure (!) 161/98, pulse (!) 150, temperature (!) 104.9 F (40.5 C), temperature source Bladder, resp. rate (!) 28, height 6\' 2"  (1.88 m), weight 99.9 kg, SpO2 97 %.    Vent Mode: PRVC FiO2 (%):  [70 %-100 %] 70 % Set Rate:  [16 bmp-20 bmp] 20 bmp Vt Set:  [540 mL] 540 mL PEEP:  [5 cmH20] 5 cmH20 Plateau Pressure:  [12 cmH20-20 cmH20] 17 cmH20   Intake/Output Summary (Last 24 hours) at Apr 19, 2021 0800 Last data filed at 04-19-21 4818 Gross per 24 hour  Intake 6882.01 ml  Output 825 ml  Net 6057.01 ml   Filed Weights   03/13/2021 1545 03/19/21 1300  Weight: 103.4 kg 99.9 kg   Examination: General: Critically ill, ventilated, unresponsive HEENT: Oropharynx with ET tube, otherwise clear, dry Neuro: Does not wake to voice, does not follow commands, sedation running CV: Sinus tachycardia, distant, no murmur PULM: Decreased at bases, otherwise clear, no wheeze or crackles GI: Nondistended, hypoactive bowel sounds Extremities: Cool, mottled skin extremities and chest, 1+ lower extremity edema  Resolved Hospital Problem list    Assessment & Plan:   Septic shock related to Cdiff colitis  Immunocompromised patient, consider other possible causes noting fungal although no clear evidence to support P:  -Supporting maximally with norepinephrine, epinephrine, phenylephrine, vasopressin.  Unfortunately has not stabilized -TTE ordered -Empiric hydrocortisone added on 10/17, also on dexamethasone given brain mets and his chemo regimen -Enteral vancomycin, Flagyl IV for C. difficile colitis -Continue empiric cefepime -Consider adding empiric Bactrim or antifungals although no clear evidence  to support these infections currently -Replace volume losses from diarrhea aggressively -Unfortunately  despite maximal support he has not stabilized.  Prognosis now poor  Acute hypoxic respiratory failure  Likely bibasilar atelectasis, can not rule out possible aspiration component  Hx COPD- no exacerbation P:  -Heparin discontinued, low suspicion PE.  Negative Doppler ultrasound 10/10 -PRVC support.  Not in a position to transition to PSV or SBT given his max -VAP prevention orders -PAD protocol for sedation -Bronchodilators as ordered  AKI likely secondary to hypoperfusion +/- obstructive given urinary retention +/- contrast nephropathy  Hyponatremia- mild  NAGMA likely due to diarrhea -Progressive oliguric renal failure.  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  No evidence of active bleeding  Transaminitis, progressive.  Likely due to shock liver -Continue to follow LFT, coag  HTN-continue to hold home amlodipine and metoprolol given shock  HLD -continue to hold statin, repeat LFTs in am  BPH-continue to hold tamsulosin and finasteride for now  Best Practice (right click and "Reselect all SmartList Selections" daily)   Diet/type: NPO DVT prophylaxis: prophylactic heparin  GI prophylaxis: PPI Lines: N/A; pta port right chest accessed Foley:  Yes, and it is still needed Code Status:  limited, no CPR Last date of multidisciplinary goals of care discussion-family discussions  10/18 Dr. Lamonte Sakai discussed with patient's wife by  phone.  She and family understand active issues, and that patient unfortunately not survive.  Efforts being made for patient's son to come from out of town to see him later today.  We may decide to undertake discussions regarding a formal withdrawal of care after he has an opportunity to do so.  Appropriateness of such a plan mentioned to the patient's wife on 10/18.   Labs   CBC: Recent Labs  Lab 03/09/2021 1046 03/18/21 0600 03/18/21 0731 03/19/21 0620 2021/03/22 0421  WBC 2.1* 0.6* 0.8* 0.6* 0.8*  NEUTROABS 1.2*  --  0.3* 0.2*  --   HGB 16.1 15.6 15.7 16.4 18.3*  HCT 48.3 46.6 46.4 47.3 54.9*  MCV 95.5 94.5 94.3 92.7 95.3  PLT 122* 112* 113* 100* 46*    Basic Metabolic Panel: Recent Labs  Lab 03/22/2021 1046 03/18/21 0600 03/19/21 0620 03/19/21 1519 03-22-2021 0023 03-22-21 0400  NA 136 135 133* 136 132* 134*  K 4.0 4.0 4.5 4.3 5.1 5.6*  CL 100 104 101 100 106 109  CO2 20* 16* 21* 22 15* 12*  GLUCOSE 186* 223* 410* 316* 153* 135*  BUN 18 15 27* 26* 36* 39*  CREATININE 0.86 0.72 2.46* 3.61* 4.19* 4.79*  CALCIUM 7.9* 7.3* 7.3* 7.4* 7.2* 7.3*  MG 2.2  --   --   --   --  3.2*  PHOS  --   --   --   --   --  5.5*   GFR: Estimated Creatinine Clearance: 18.9 mL/min (A) (by C-G formula based on SCr of 4.79 mg/dL (H)). Recent Labs  Lab 03/18/21 0600 03/18/21 0731 03/19/21 0620 03/19/21 1828 Mar 22, 2021 0023 Mar 22, 2021 0400 22-Mar-2021 0421  WBC 0.6* 0.8* 0.6*  --   --   --  0.8*  LATICACIDVEN  --   --   --  5.9* 5.1* 6.0*  --     Liver Function Tests: Recent Labs  Lab 03/12/2021 1046 03/18/21 0600 03/19/21 0620 03/19/21 1519 04/19/21 0400  AST 27 20 77* 133* 406*  ALT 60* 46* 85* 107* 271*  ALKPHOS 53 50 46 47 57  BILITOT 2.0* 2.0* 2.4* 1.6* 1.8*  PROT 5.5* 5.2* 4.4* 4.3* 4.1*  ALBUMIN 3.1* 2.8* 2.2* 2.0* 1.8*   No results for input(s): LIPASE, AMYLASE in the last 168 hours. No results for input(s): AMMONIA in the last 168 hours.  ABG    Component Value Date/Time    PHART 7.121 (LL) April 19, 2021 0400   PCO2ART 43.7 04/19/21 0400   PO2ART 99.1 2021/04/19 0400   HCO3 12.8 (L) 19-Apr-2021 0400   ACIDBASEDEF 15.6 (H) 04-19-2021 0400   O2SAT 92.3 04-19-21 0400     Coagulation Profile: Recent Labs  Lab 03/19/21 1329  INR 2.4*    Cardiac Enzymes: No results for input(s): CKTOTAL, CKMB, CKMBINDEX, TROPONINI in the last 168 hours.  HbA1C: Hemoglobin A1C  Date/Time Value Ref Range Status  01/11/2021 08:45 AM 6.9 (A) 4.0 - 5.6 % Final  07/12/2020 08:46 AM 6.7 (A) 4.0 - 5.6 % Final   Hgb A1c MFr Bld  Date/Time Value Ref Range Status  03/21/2021 10:46 AM 9.9 (H) 4.8 - 5.6 % Final    Comment:    (NOTE) Pre diabetes:          5.7%-6.4%  Diabetes:              >6.4%  Glycemic control for   <7.0% adults with diabetes     CBG: Recent Labs  Lab 03/19/21 1935 03/19/21 2307 04-19-2021 0337 Apr 19, 2021 0716 19-Apr-2021 0741  GLUCAP 174* 132* 90 71 116*     Critical care time:  87 min     Baltazar Apo, MD, PhD April 19, 2021, 8:11 AM [facility] [phone] or if no answer before 7:00PM call [phone] For any issues after 7:00PM please call eLink [phone]

## 2021-04-03 DEATH — deceased

## 2021-04-09 ENCOUNTER — Ambulatory Visit: Payer: Medicare Other | Admitting: Diagnostic Neuroimaging

## 2021-04-19 ENCOUNTER — Other Ambulatory Visit: Payer: Medicare Other

## 2021-04-19 ENCOUNTER — Ambulatory Visit: Payer: Medicare Other | Admitting: Nurse Practitioner

## 2021-04-19 ENCOUNTER — Ambulatory Visit: Payer: Medicare Other

## 2021-05-04 ENCOUNTER — Encounter: Payer: Medicare Other | Admitting: Family Medicine

## 2021-05-20 IMAGING — CT CT CHEST LUNG CANCER SCREENING LOW DOSE
1 of 2 series · 10 of 20 positions shown, 13 images · non-contrast
Comparison: No priors.

CLINICAL DATA: 67-year-old male former smoker (quit March 2018)
with 60 pack-year history of smoking. Lung cancer screening
examination.

EXAM:
CT CHEST WITHOUT CONTRAST LOW-DOSE FOR LUNG CANCER SCREENING
TECHNIQUE: Multidetector CT imaging of the chest was performed following the
standard protocol without IV contrast.

[ct lung segmentation data · axial · 0.87mm/px · z∈[-404,-404]mm · 10 of 336 frames shown]
[frame 1/336  mediastinal]
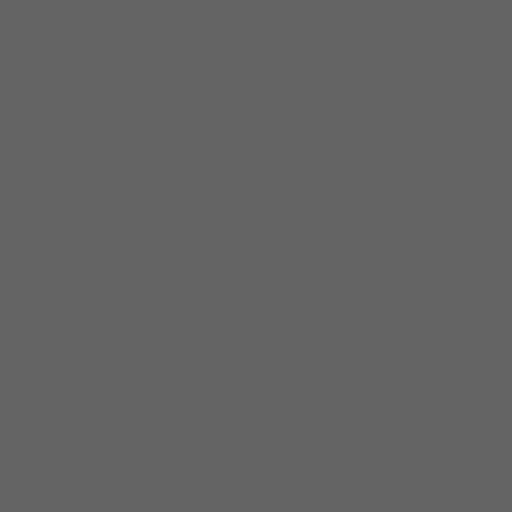
[frame 1/336  lung]
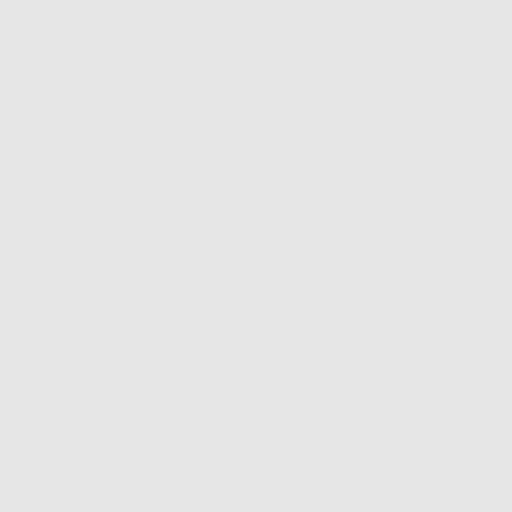
[frame 38/336  lung]
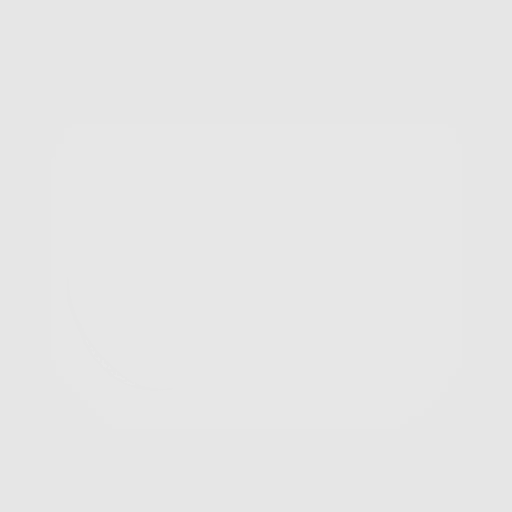
[frame 75/336  lung]
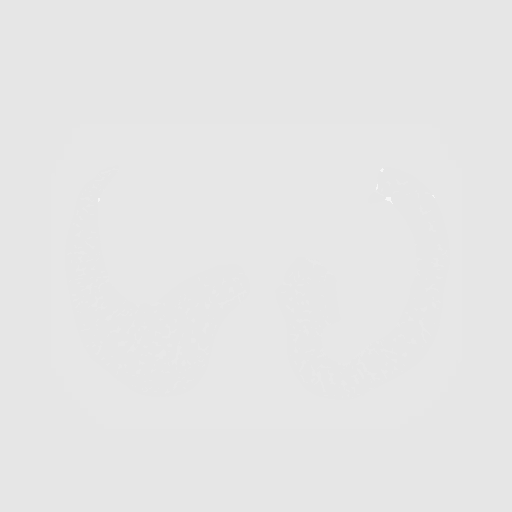
[frame 112/336  lung]
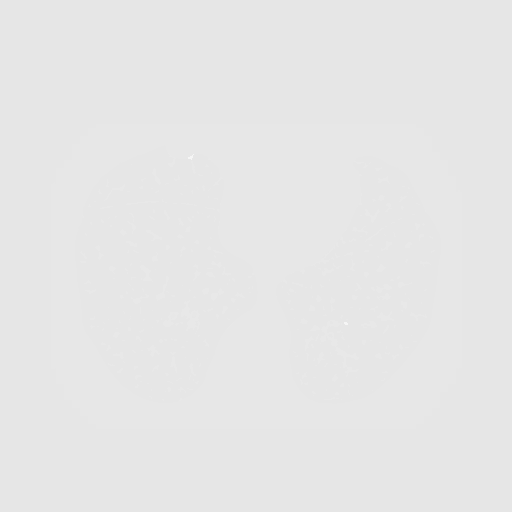
[frame 149/336  mediastinal]
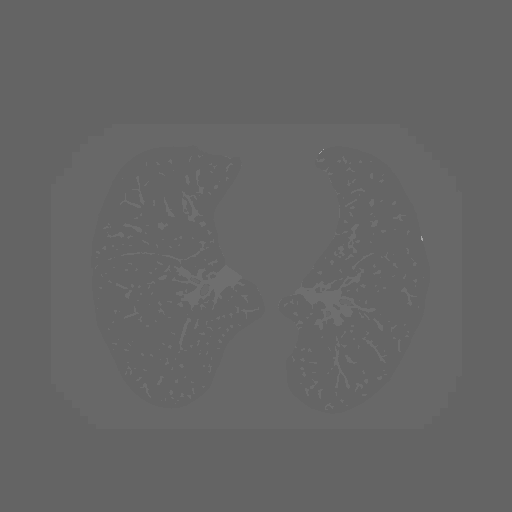
[frame 149/336  lung]
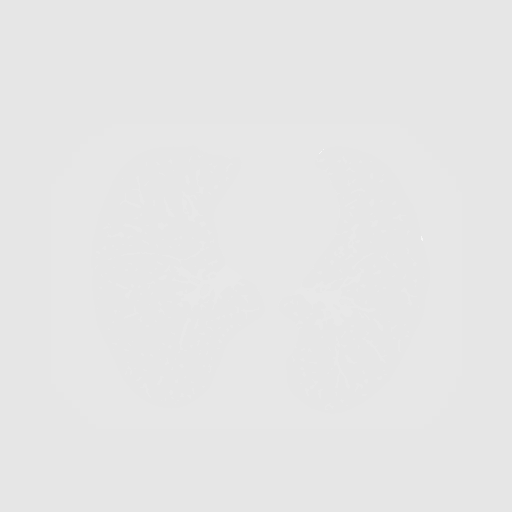
[frame 187/336  lung]
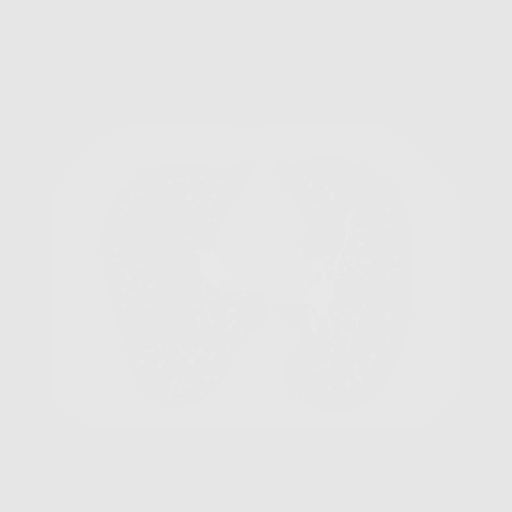
[frame 224/336  lung]
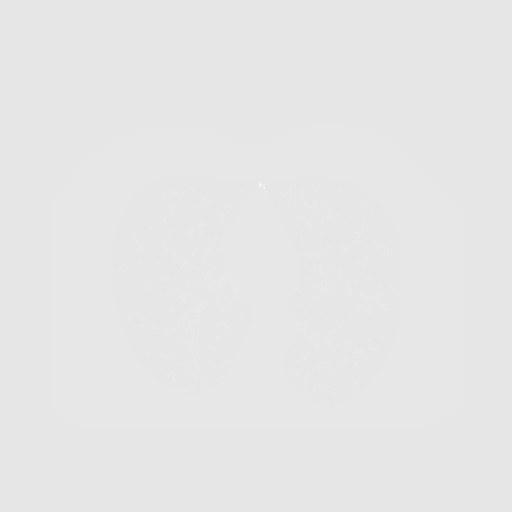
[frame 261/336  lung]
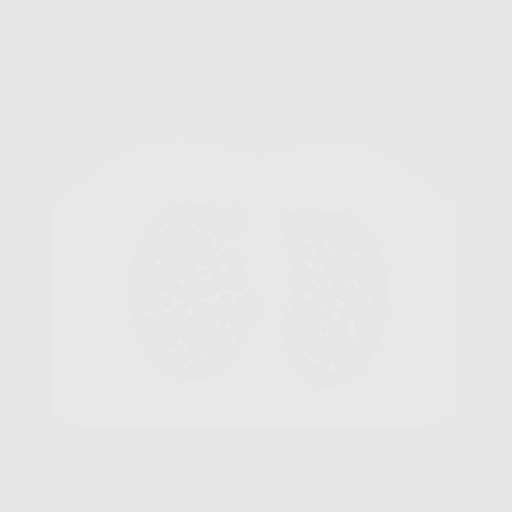
[frame 298/336  mediastinal]
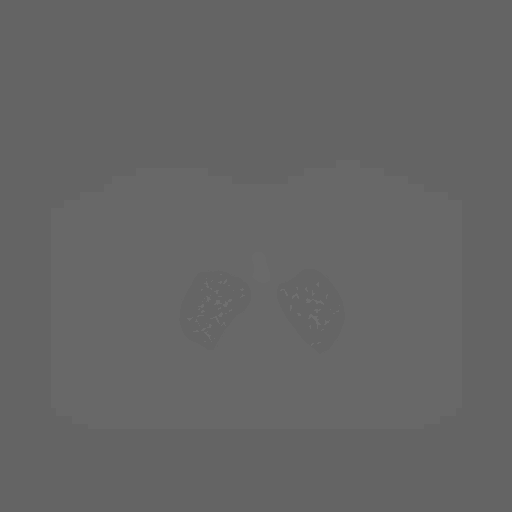
[frame 298/336  lung]
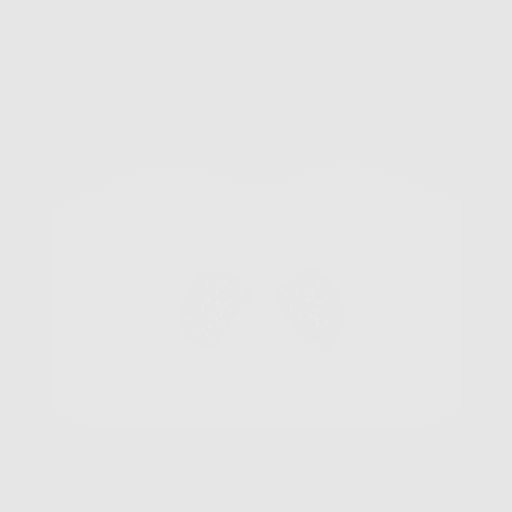
[frame 336/336  lung]
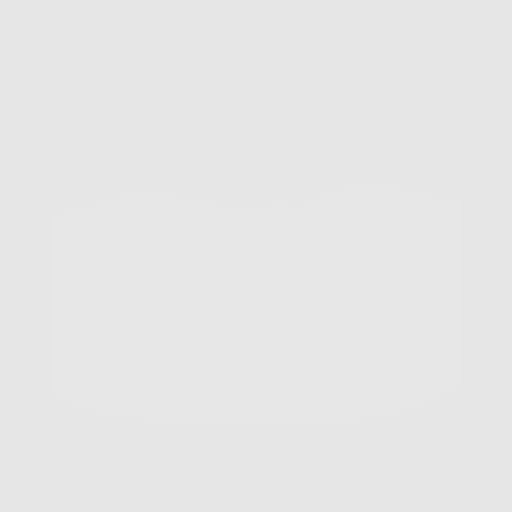

[10 of 20 positions shown; findings below may reference images not displayed]

FINDINGS: Cardiovascular: Heart size is normal. There is no significant
pericardial fluid, thickening or pericardial calcification. There is
aortic atherosclerosis, as well as atherosclerosis of the great
vessels of the mediastinum and the coronary arteries, including
calcified atherosclerotic plaque in the left main, left anterior
descending, left circumflex and right coronary arteries. Mild
calcifications of the aortic valve.

Mediastinum/Nodes: No pathologically enlarged mediastinal or hilar
lymph nodes. Please note that accurate exclusion of hilar adenopathy
is limited on noncontrast CT scans. Esophagus is unremarkable in
appearance. No axillary lymphadenopathy.

Lungs/Pleura: Several small calcified pulmonary nodules are noted in
the lungs bilaterally. No other noncalcified pulmonary nodules are
noted. No acute consolidative airspace disease. No pleural
effusions. Mild diffuse bronchial wall thickening with mild
centrilobular and paraseptal emphysema.

Upper Abdomen: Aortic atherosclerosis. Diffuse low attenuation
throughout the visualized hepatic parenchyma, indicative of hepatic
steatosis.

Musculoskeletal: There are no aggressive appearing lytic or blastic
lesions noted in the visualized portions of the skeleton.
IMPRESSION: 1. Lung-RADS 1S, negative. Continue annual screening with low-dose
chest CT without contrast in 12 months.
2. The "S" modifier above refers to potentially clinically
significant non lung cancer related findings. Specifically, there is
aortic atherosclerosis, in addition to left main and 3 vessel
coronary artery disease. Please note that although the presence of
coronary artery calcium documents the presence of coronary artery
disease, the severity of this disease and any potential stenosis
cannot be assessed on this non-gated CT examination. Assessment for
potential risk factor modification, dietary therapy or pharmacologic
therapy may be warranted, if clinically indicated.
3. Mild diffuse bronchial wall thickening with mild centrilobular
and paraseptal emphysema; imaging findings suggestive of underlying
COPD.
4. There are mild calcifications of the aortic valve.
Echocardiographic correlation for evaluation of potential valvular
dysfunction may be warranted if clinically indicated.

Aortic Atherosclerosis (RWT07-PRL.L) and Emphysema (RWT07-WYO.I).

## 2021-05-30 ENCOUNTER — Ambulatory Visit: Payer: Medicare Other | Admitting: Family Medicine

## 2021-06-14 ENCOUNTER — Ambulatory Visit: Payer: Medicare Other | Admitting: Internal Medicine

## 2021-07-13 ENCOUNTER — Ambulatory Visit: Payer: Medicare Other

## 2022-01-30 ENCOUNTER — Other Ambulatory Visit (HOSPITAL_BASED_OUTPATIENT_CLINIC_OR_DEPARTMENT_OTHER): Payer: Self-pay
